# Patient Record
Sex: Male | Born: 1955 | Race: Black or African American | Hispanic: No | Marital: Married | Smoking: Current some day smoker
Health system: Southern US, Community
[De-identification: ages and names within clinical notes are randomized; demographics above are authoritative.]

## PROBLEM LIST (undated history)

## (undated) DIAGNOSIS — K8050 Calculus of bile duct without cholangitis or cholecystitis without obstruction: Secondary | ICD-10-CM

## (undated) DIAGNOSIS — F1421 Cocaine dependence, in remission: Secondary | ICD-10-CM

## (undated) DIAGNOSIS — S060XAA Concussion with loss of consciousness status unknown, initial encounter: Secondary | ICD-10-CM

## (undated) DIAGNOSIS — N189 Chronic kidney disease, unspecified: Secondary | ICD-10-CM

## (undated) DIAGNOSIS — K859 Acute pancreatitis without necrosis or infection, unspecified: Secondary | ICD-10-CM

## (undated) DIAGNOSIS — I10 Essential (primary) hypertension: Secondary | ICD-10-CM

## (undated) DIAGNOSIS — M419 Scoliosis, unspecified: Secondary | ICD-10-CM

## (undated) DIAGNOSIS — G43909 Migraine, unspecified, not intractable, without status migrainosus: Secondary | ICD-10-CM

## (undated) DIAGNOSIS — R7303 Prediabetes: Secondary | ICD-10-CM

## (undated) DIAGNOSIS — G4730 Sleep apnea, unspecified: Secondary | ICD-10-CM

## (undated) DIAGNOSIS — Z859 Personal history of malignant neoplasm, unspecified: Secondary | ICD-10-CM

## (undated) DIAGNOSIS — F32A Depression, unspecified: Secondary | ICD-10-CM

## (undated) DIAGNOSIS — C679 Malignant neoplasm of bladder, unspecified: Secondary | ICD-10-CM

## (undated) DIAGNOSIS — I82409 Acute embolism and thrombosis of unspecified deep veins of unspecified lower extremity: Secondary | ICD-10-CM

## (undated) DIAGNOSIS — S199XXA Unspecified injury of neck, initial encounter: Secondary | ICD-10-CM

## (undated) DIAGNOSIS — F1121 Opioid dependence, in remission: Secondary | ICD-10-CM

## (undated) DIAGNOSIS — E785 Hyperlipidemia, unspecified: Secondary | ICD-10-CM

## (undated) DIAGNOSIS — B1920 Unspecified viral hepatitis C without hepatic coma: Secondary | ICD-10-CM

## (undated) DIAGNOSIS — K219 Gastro-esophageal reflux disease without esophagitis: Secondary | ICD-10-CM

## (undated) HISTORY — DX: Acute embolism and thrombosis of unspecified deep veins of unspecified lower extremity: I82.409

## (undated) HISTORY — PX: HERNIA REPAIR: SHX51

## (undated) HISTORY — DX: Migraine, unspecified, not intractable, without status migrainosus: G43.909

## (undated) HISTORY — DX: Hyperlipidemia, unspecified: E78.5

## (undated) HISTORY — DX: Malignant neoplasm of bladder, unspecified: C67.9

## (undated) HISTORY — DX: Personal history of malignant neoplasm, unspecified: Z85.9

## (undated) HISTORY — PX: FOOT SURGERY: SHX648

## (undated) HISTORY — DX: Unspecified viral hepatitis C without hepatic coma: B19.20

## (undated) HISTORY — DX: Depression, unspecified: F32.A

## (undated) HISTORY — PX: BACK SURGERY: SHX140

## (undated) HISTORY — PX: ELBOW SURGERY: SHX618

## (undated) HISTORY — DX: Sleep apnea, unspecified: G47.30

## (undated) HISTORY — DX: Calculus of bile duct without cholangitis or cholecystitis without obstruction: K80.50

## (undated) HISTORY — DX: Scoliosis, unspecified: M41.9

## (undated) HISTORY — DX: Concussion with loss of consciousness status unknown, initial encounter: S06.0XAA

## (undated) HISTORY — DX: Gastro-esophageal reflux disease without esophagitis: K21.9

## (undated) HISTORY — DX: Cocaine dependence, in remission: F14.21

## (undated) HISTORY — PX: CHOLECYSTECTOMY: SHX55

## (undated) HISTORY — PX: NO PAST SURGERIES: SHX2092

## (undated) HISTORY — DX: Acute pancreatitis without necrosis or infection, unspecified: K85.90

## (undated) HISTORY — DX: Chronic kidney disease, unspecified: N18.9

## (undated) HISTORY — DX: Prediabetes: R73.03

## (undated) HISTORY — DX: Opioid dependence, in remission: F11.21

---
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
## 1998-05-25 DIAGNOSIS — M549 Dorsalgia, unspecified: Secondary | ICD-10-CM | POA: Insufficient documentation

## 1998-05-25 DIAGNOSIS — G8929 Other chronic pain: Secondary | ICD-10-CM | POA: Insufficient documentation

## 2016-04-20 DIAGNOSIS — M2042 Other hammer toe(s) (acquired), left foot: Secondary | ICD-10-CM

## 2016-04-20 DIAGNOSIS — F430 Acute stress reaction: Secondary | ICD-10-CM | POA: Insufficient documentation

## 2016-04-20 DIAGNOSIS — M2010 Hallux valgus (acquired), unspecified foot: Secondary | ICD-10-CM | POA: Insufficient documentation

## 2016-04-20 DIAGNOSIS — M2041 Other hammer toe(s) (acquired), right foot: Secondary | ICD-10-CM | POA: Insufficient documentation

## 2017-10-23 ENCOUNTER — Emergency Department: Payer: Medicaid Other

## 2017-10-23 ENCOUNTER — Encounter: Payer: Self-pay | Admitting: Emergency Medicine

## 2017-10-23 ENCOUNTER — Emergency Department
Admission: EM | Admit: 2017-10-23 | Discharge: 2017-10-23 | Disposition: A | Discharge status: Home / Self Care | Payer: Medicaid Other | Attending: Emergency Medicine | Admitting: Emergency Medicine

## 2017-10-23 ENCOUNTER — Other Ambulatory Visit: Payer: Self-pay

## 2017-10-23 DIAGNOSIS — R51 Headache: Secondary | ICD-10-CM | POA: Insufficient documentation

## 2017-10-23 DIAGNOSIS — Z79899 Other long term (current) drug therapy: Secondary | ICD-10-CM | POA: Insufficient documentation

## 2017-10-23 DIAGNOSIS — Y9389 Activity, other specified: Secondary | ICD-10-CM | POA: Diagnosis not present

## 2017-10-23 DIAGNOSIS — I10 Essential (primary) hypertension: Secondary | ICD-10-CM | POA: Insufficient documentation

## 2017-10-23 DIAGNOSIS — Y92410 Unspecified street and highway as the place of occurrence of the external cause: Secondary | ICD-10-CM | POA: Insufficient documentation

## 2017-10-23 DIAGNOSIS — Y998 Other external cause status: Secondary | ICD-10-CM | POA: Diagnosis not present

## 2017-10-23 DIAGNOSIS — M545 Low back pain: Secondary | ICD-10-CM | POA: Diagnosis not present

## 2017-10-23 DIAGNOSIS — S161XXA Strain of muscle, fascia and tendon at neck level, initial encounter: Secondary | ICD-10-CM | POA: Insufficient documentation

## 2017-10-23 DIAGNOSIS — R519 Headache, unspecified: Secondary | ICD-10-CM

## 2017-10-23 DIAGNOSIS — S199XXA Unspecified injury of neck, initial encounter: Secondary | ICD-10-CM | POA: Diagnosis present

## 2017-10-23 HISTORY — DX: Essential (primary) hypertension: I10
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
## 2017-10-23 MED ORDER — ACETAMINOPHEN 325 MG PO TABS
650.0000 mg | ORAL_TABLET | Freq: Once | ORAL | Status: AC
  Administered 2017-10-23: 650 mg via ORAL
  Filled 2017-10-23: qty 2

## 2017-10-23 MED ORDER — CYCLOBENZAPRINE HCL 5 MG PO TABS: ORAL_TABLET | ORAL | 0 refills | Status: DC

## 2017-10-23 MED ORDER — NAPROXEN 500 MG PO TABS: 500.0000 mg | ORAL_TABLET | Freq: Two times a day (BID) | ORAL | 0 refills | Status: DC
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
## 2018-07-04 ENCOUNTER — Ambulatory Visit
Admission: EM | Admit: 2018-07-04 | Discharge: 2018-07-04 | Discharge status: Against Medical Advice | Payer: No Typology Code available for payment source

## 2018-07-04 ENCOUNTER — Other Ambulatory Visit: Payer: Self-pay

## 2018-07-04 ENCOUNTER — Emergency Department: Payer: Medicaid Other

## 2018-07-04 ENCOUNTER — Emergency Department
Admission: EM | Admit: 2018-07-04 | Discharge: 2018-07-04 | Disposition: A | Discharge status: Home / Self Care | Payer: Medicaid Other | Attending: Emergency Medicine | Admitting: Emergency Medicine

## 2018-07-04 DIAGNOSIS — X500XXA Overexertion from strenuous movement or load, initial encounter: Secondary | ICD-10-CM | POA: Insufficient documentation

## 2018-07-04 DIAGNOSIS — Y990 Civilian activity done for income or pay: Secondary | ICD-10-CM | POA: Insufficient documentation

## 2018-07-04 DIAGNOSIS — M5489 Other dorsalgia: Secondary | ICD-10-CM | POA: Diagnosis present

## 2018-07-04 DIAGNOSIS — S39012A Strain of muscle, fascia and tendon of lower back, initial encounter: Secondary | ICD-10-CM | POA: Insufficient documentation

## 2018-07-04 DIAGNOSIS — S29012A Strain of muscle and tendon of back wall of thorax, initial encounter: Secondary | ICD-10-CM | POA: Diagnosis not present

## 2018-07-04 DIAGNOSIS — Y9389 Activity, other specified: Secondary | ICD-10-CM | POA: Insufficient documentation

## 2018-07-04 DIAGNOSIS — T148XXA Other injury of unspecified body region, initial encounter: Secondary | ICD-10-CM

## 2018-07-04 DIAGNOSIS — Y9289 Other specified places as the place of occurrence of the external cause: Secondary | ICD-10-CM | POA: Insufficient documentation

## 2018-07-04 DIAGNOSIS — I10 Essential (primary) hypertension: Secondary | ICD-10-CM | POA: Insufficient documentation

## 2018-07-04 IMAGING — CR DG LUMBAR SPINE 2-3V
1 series · 3 of 3 positions shown · non-contrast
Comparison: [DATE]

CLINICAL DATA: Low back pain post picking up heavy objects.

EXAM:
LUMBAR SPINE - 2-3 VIEW

[Series 1: dg lumbar spine 2-3 views · 0.14mm/px · 3 of 3 slices shown]
[im 1/3]
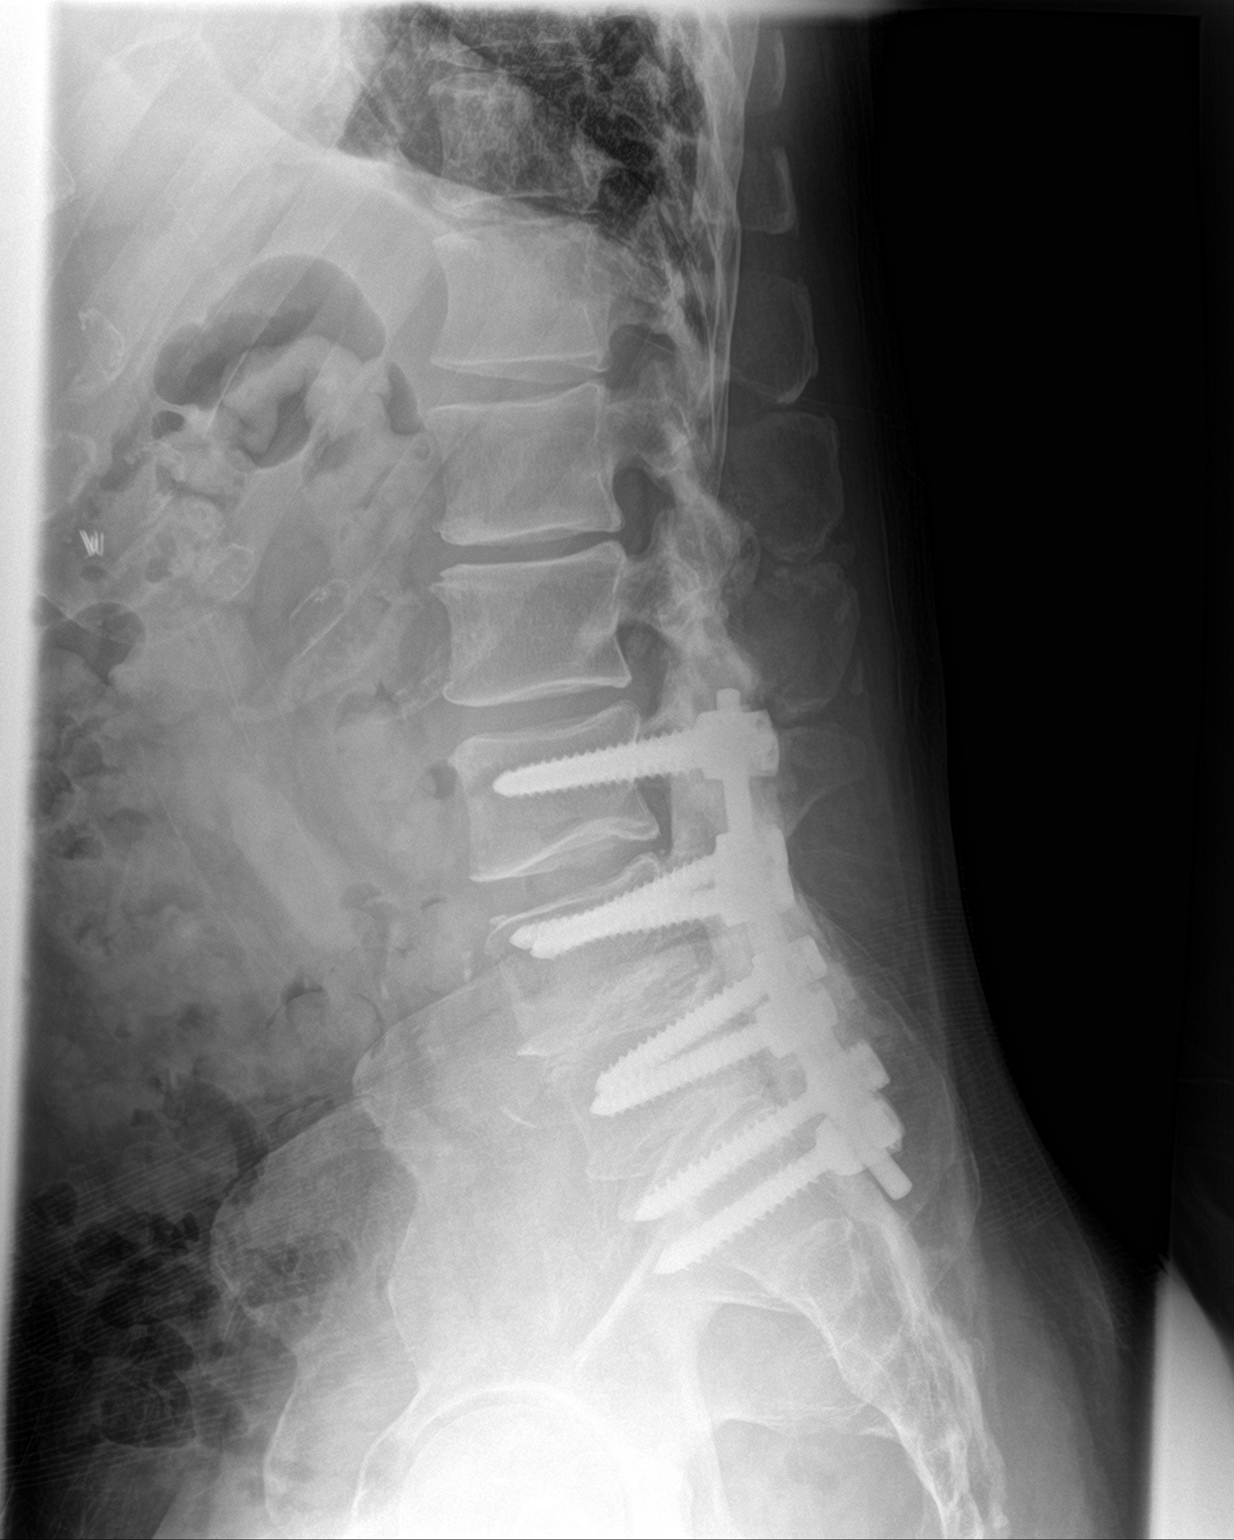
[im 2/3]
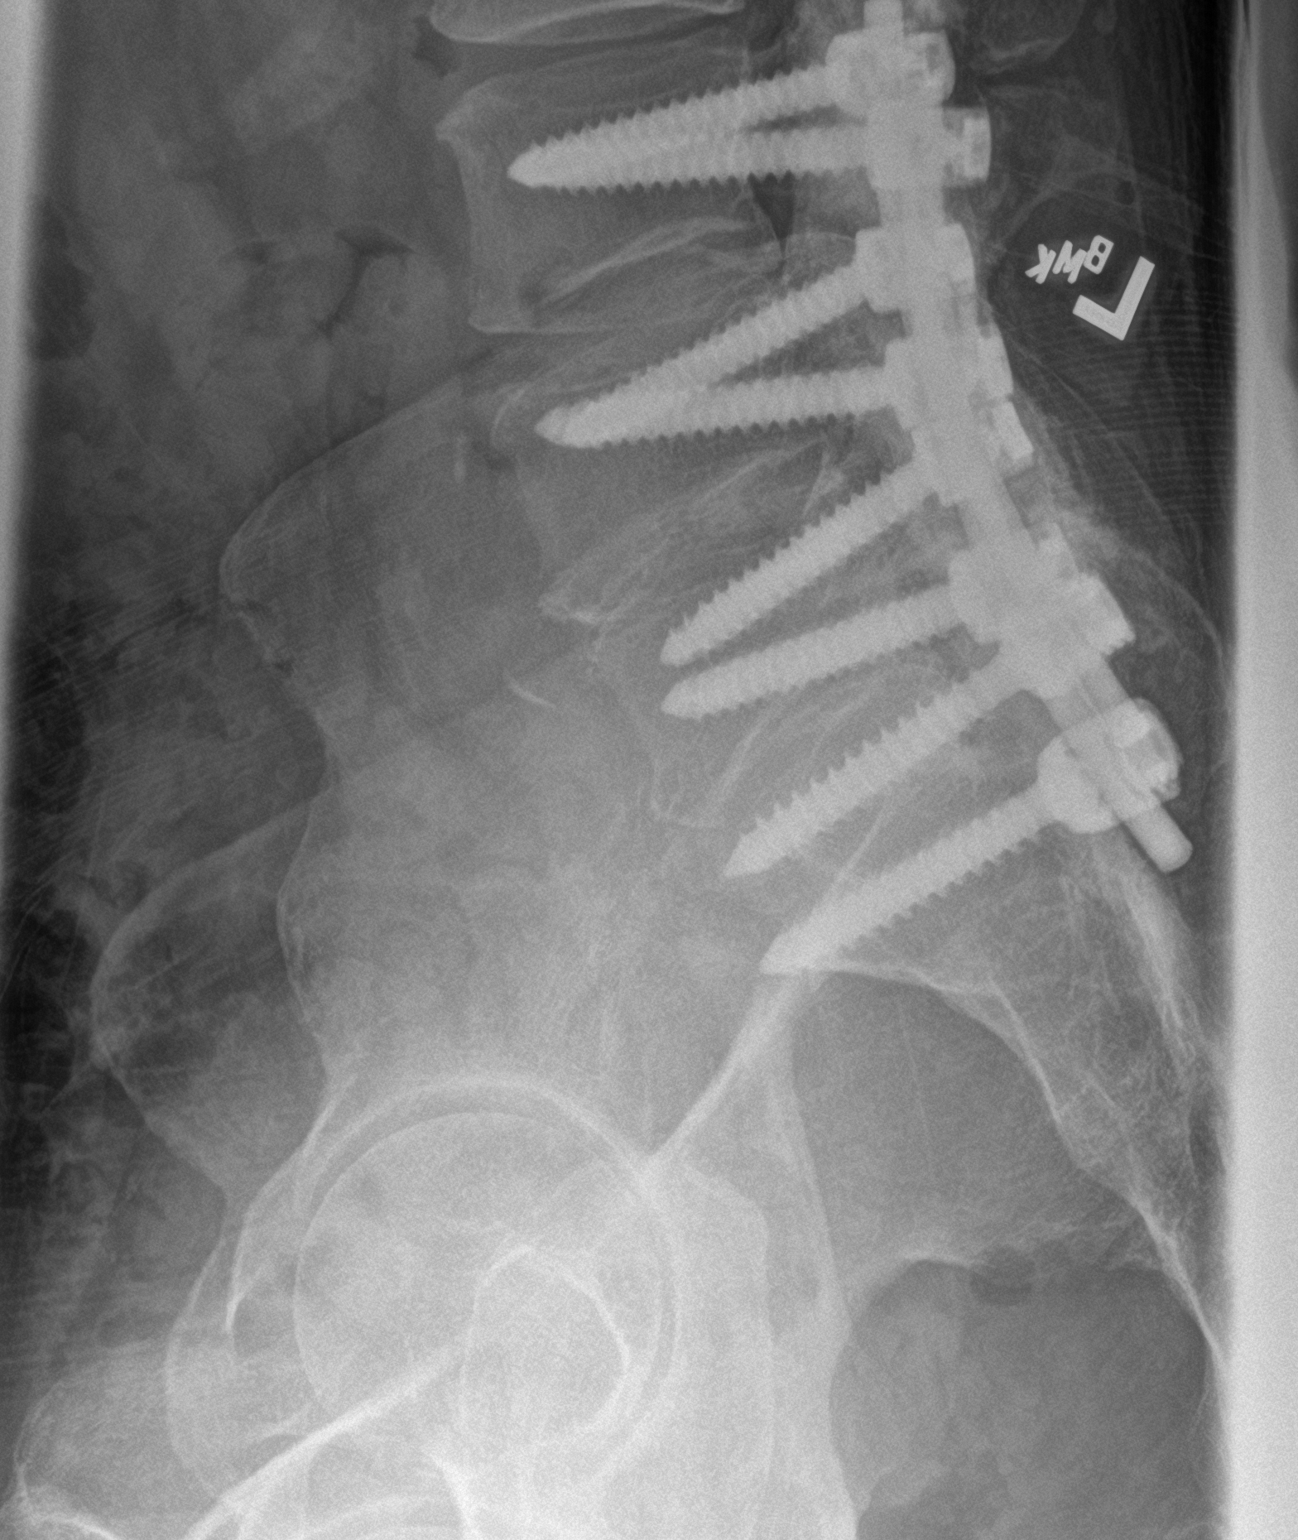
[im 3/3]
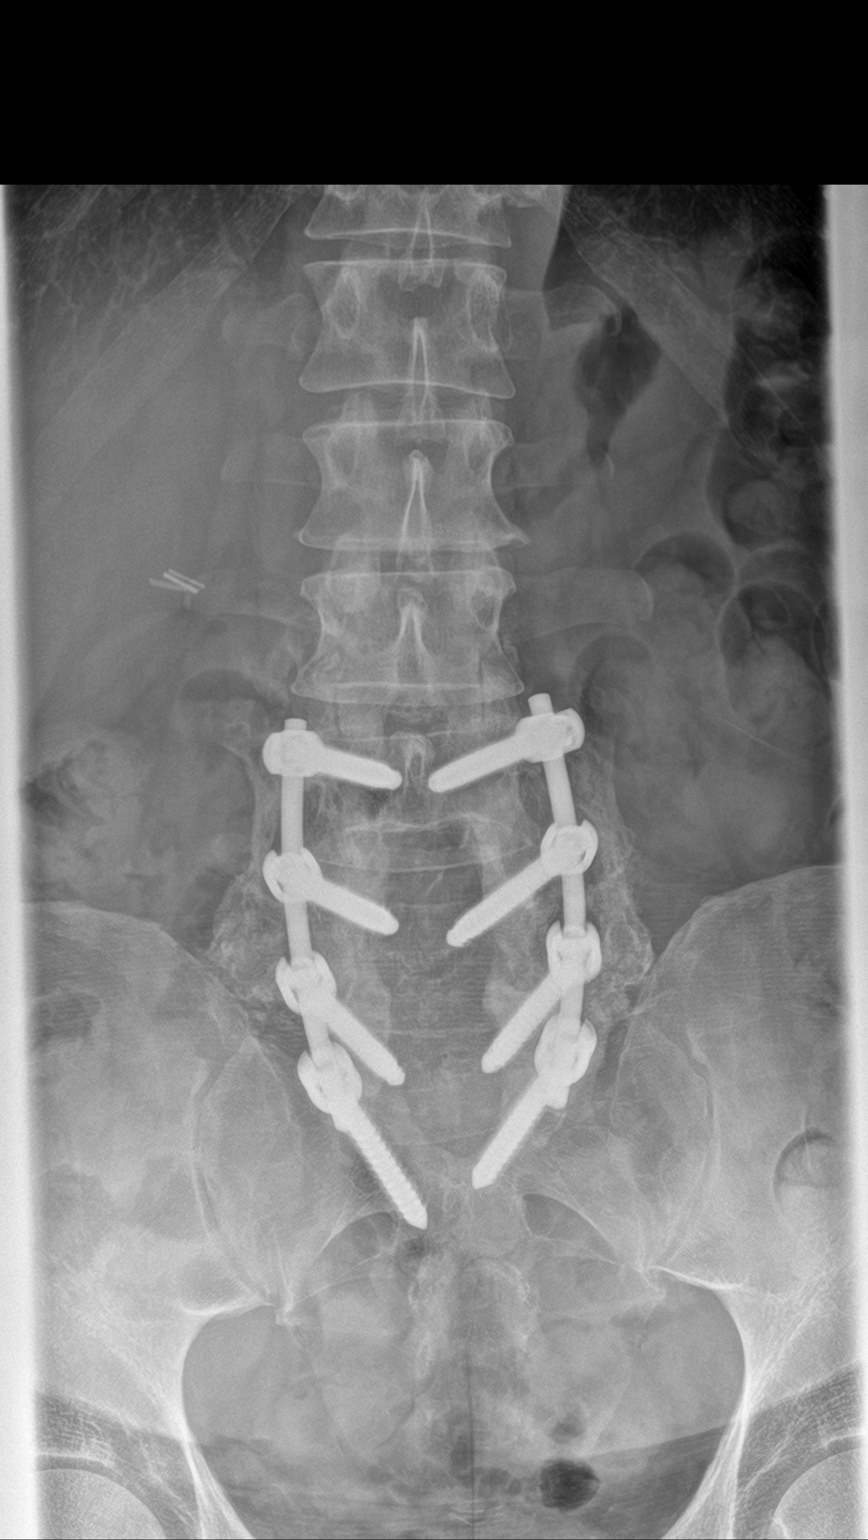

[3 of 3 positions shown; findings below may reference images not displayed]

FINDINGS: Using the vertebral body labeling consistent with the prior exam
dated [DATE], assuming small rudimentary ribs at T12 vertebral
body, there is a stable appearance of L3-S1 posterior fusion with
associated L4-S1 posterior decompression. The hardware is intact and
there is normal alignment of the lumbosacral spine. No evidence of
fracture. Minimal osteoarthritic changes at L1-L2 and L2-L3.

Mild calcific atherosclerotic disease of the abdominal aorta.
IMPRESSION: No acute fracture or alignment abnormality of the lumbosacral spine.

Stable L3-S1 posterior spinal fusion without complicating features.

## 2018-07-04 MED ORDER — CYCLOBENZAPRINE HCL 10 MG PO TABS
5.0000 mg | ORAL_TABLET | Freq: Once | ORAL | Status: AC
  Administered 2018-07-04: 5 mg via ORAL
  Filled 2018-07-04: qty 1

## 2018-07-04 MED ORDER — CYCLOBENZAPRINE HCL 5 MG PO TABS: ORAL_TABLET | ORAL | 0 refills | Status: DC

## 2018-07-04 MED ORDER — LIDOCAINE 5 % EX PTCH
1.0000 | MEDICATED_PATCH | CUTANEOUS | Status: DC
  Administered 2018-07-04: 1 via TRANSDERMAL
  Filled 2018-07-04: qty 1

## 2018-07-04 MED ORDER — LIDOCAINE 5 % EX PTCH: 1.0000 | MEDICATED_PATCH | CUTANEOUS | 0 refills | Status: DC

## 2018-07-04 NOTE — ED Triage Notes (Signed)
Pt was picking heavy object at work and injured right shoulder and lower back.

## 2018-07-04 NOTE — ED Notes (Signed)
Pt employed with [employer], workers comp profile indicates UDS required

## 2018-07-04 NOTE — ED Provider Notes (Signed)
[facility] Emergency Department Provider Note  ____________________________________________  Time seen: Approximately 8:17 PM  I have reviewed the triage vital signs and the nursing notes.   HISTORY  Chief Complaint Back Pain    HPI Earl Murray is a 63 y.o. male that presents to the emergency department for evaluation of right upper back pain and low back pain after injury today.  Patient states that he was lifting up a large roll of wax paper that weighed at least 100 pounds when he felt a pull in his back.  He has had pain since.  He has taken Aleve for pain, without relief.  Past Medical History:  Diagnosis Date  . Hypertension     There are no active problems to display for this patient.   No past surgical history on file.  Prior to Admission medications   Medication Sig Start Date End Date Taking? Authorizing Provider  cyclobenzaprine (FLEXERIL) 5 MG tablet Take 1-2 tablets 3 times daily as needed 07/04/18   Laban Emperor, PA-C  lidocaine (LIDODERM) 5 % Place 1 patch onto the skin daily. Remove & Discard patch within 12 hours or as directed by MD 07/04/18   Laban Emperor, PA-C  naproxen (NAPROSYN) 500 MG tablet Take 1 tablet (500 mg total) by mouth 2 (two) times daily with a meal. 10/23/17 10/23/18  Laban Emperor, PA-C    Allergies Motrin [ibuprofen] and Tramadol  No family history on file.  Social History Social History   Tobacco Use  . Smoking status: Not on file  Substance Use Topics  . Alcohol use: Not on file  . Drug use: Not on file     Review of Systems  Constitutional: No fever/chills Cardiovascular: No chest pain. Respiratory: No SOB. Gastrointestinal: No abdominal pain.  No nausea, no vomiting.  Musculoskeletal: Positive for back pain.  Skin: Negative for rash, abrasions, lacerations, ecchymosis. Neurological: Negative for headaches   ____________________________________________   PHYSICAL EXAM:  VITAL SIGNS: ED  Triage Vitals  Enc Vitals Group     BP 07/04/18 1917 127/72     Pulse Rate 07/04/18 1917 61     Resp --      Temp 07/04/18 1917 98.1 F (36.7 C)     Temp Source 07/04/18 1917 Oral     SpO2 07/04/18 1917 95 %     Weight 07/04/18 1918 205 lb (93 kg)     Height 07/04/18 1918 6\' 5"  (1.956 m)     Head Circumference --      Peak Flow --      Pain Score 07/04/18 1922 8     Pain Loc --      Pain Edu? --      Excl. in [place]? --      Constitutional: Alert and oriented. Well appearing and in no acute distress. Eyes: Conjunctivae are normal. PERRL. EOMI. Head: Atraumatic. ENT:      Ears:      Nose: No congestion/rhinnorhea.      Mouth/Throat: Mucous membranes are moist.  Neck: No stridor. Cardiovascular: Normal rate, regular rhythm.  Good peripheral circulation. Respiratory: Normal respiratory effort without tachypnea or retractions. Lungs CTAB. Good air entry to the bases with no decreased or absent breath sounds. Gastrointestinal: Bowel sounds 4 quadrants. Soft and nontender to palpation. No guarding or rigidity. No palpable masses. No distention.  Musculoskeletal: Full range of motion to all extremities. No gross deformities appreciated.  Tenderness to palpation to right scapula.  Mild tenderness to palpation diffusely  through lumbar spine and lumbar paraspinal muscles.  Normal gait. Neurologic:  Normal speech and language. No gross focal neurologic deficits are appreciated.  Skin:  Skin is warm, dry and intact. No rash noted. Psychiatric: Mood and affect are normal. Speech and behavior are normal. Patient exhibits appropriate insight and judgement.   ____________________________________________   LABS (all labs ordered are listed, but only abnormal results are displayed)  Labs Reviewed - No data to display ____________________________________________  EKG   ____________________________________________  RADIOLOGY [name], personally viewed and evaluated these images  (plain radiographs) as part of my medical decision making, as well as reviewing the written report by the radiologist.  Dg Lumbar Spine 2-3 Views  Result Date: 07/04/2018 CLINICAL DATA:  Low back pain post picking up heavy objects. EXAM: LUMBAR SPINE - 2-3 VIEW COMPARISON:  10/23/2017 FINDINGS: Using the vertebral body labeling consistent with the prior exam dated 10/23/2017, assuming small rudimentary ribs at T12 vertebral body, there is a stable appearance of L3-S1 posterior fusion with associated L4-S1 posterior decompression. The hardware is intact and there is normal alignment of the lumbosacral spine. No evidence of fracture. Minimal osteoarthritic changes at L1-L2 and L2-L3. Mild calcific atherosclerotic disease of the abdominal aorta. IMPRESSION: No acute fracture or alignment abnormality of the lumbosacral spine. Stable L3-S1 posterior spinal fusion without complicating features. Electronically Signed   By: Fidela Salisbury M.D.   On: 07/04/2018 20:31   Dg Scapula Right  Result Date: 07/04/2018 CLINICAL DATA:  Right shoulder lifting injury today. Initial encounter. EXAM: RIGHT SCAPULA - 2+ VIEWS COMPARISON:  None. FINDINGS: There is no evidence of fracture or other focal bone lesions. Soft tissues are unremarkable. IMPRESSION: Negative exam. Electronically Signed   By: Inge Rise M.D.   On: 07/04/2018 20:26    ____________________________________________    PROCEDURES  Procedure(s) performed:    Procedures    Medications  lidocaine (LIDODERM) 5 % 1 patch (1 patch Transdermal Patch Applied 07/04/18 2048)  cyclobenzaprine (FLEXERIL) tablet 5 mg (5 mg Oral Given 07/04/18 2047)     ____________________________________________   INITIAL IMPRESSION / ASSESSMENT AND PLAN / ED COURSE  Pertinent labs & imaging results that were available during my care of the patient were reviewed by me and considered in my medical decision making (see chart for details).  Review of the [place]  CSRS was performed in accordance of the [place] prior to dispensing any controlled drugs.     Patient presented to emergency department for evaluation after lifting injury at work today.  Vital signs and exam are reassuring.  Scapular and lumbar x-rays are negative for acute bony abnormalities.  Patient felt better after Flexeril and Lidoderm.  He appears well.  Patient will be discharged home with prescriptions for Flexeril and Lidoderm. Patient is to follow up with primary care as directed. Patient is given ED precautions to return to the ED for any worsening or new symptoms.     ____________________________________________  FINAL CLINICAL IMPRESSION(S) / ED DIAGNOSES  Final diagnoses:  Muscle strain      NEW MEDICATIONS STARTED DURING THIS VISIT:  ED Discharge Orders         Ordered    cyclobenzaprine (FLEXERIL) 5 MG tablet     07/04/18 2115    lidocaine (LIDODERM) 5 %  Every 24 hours     07/04/18 2115              This chart was dictated using voice recognition software/Dragon. Despite best efforts to proofread,  errors can occur which can change the meaning. Any change was purely unintentional.    Laban Emperor, PA-C 07/04/18 [place], [place], MD 07/04/18 2326

## 2018-07-26 ENCOUNTER — Ambulatory Visit
Admission: EM | Admit: 2018-07-26 | Discharge: 2018-07-26 | Discharge status: Absent without leave | Payer: Medicaid Other

## 2018-08-15 ENCOUNTER — Other Ambulatory Visit: Payer: Self-pay

## 2018-08-15 ENCOUNTER — Ambulatory Visit
Admission: EM | Admit: 2018-08-15 | Discharge: 2018-08-15 | Disposition: A | Discharge status: Home / Self Care | Payer: Medicaid Other | Attending: Family Medicine | Admitting: Family Medicine

## 2018-08-15 DIAGNOSIS — R600 Localized edema: Secondary | ICD-10-CM | POA: Diagnosis not present

## 2018-08-15 MED ORDER — HYDROCHLOROTHIAZIDE 25 MG PO TABS: 25.0000 mg | ORAL_TABLET | Freq: Every day | ORAL | 1 refills | Status: DC

## 2018-08-15 NOTE — ED Provider Notes (Signed)
[facility]    CSN: [id number] Arrival date & time: 08/15/18  1214  History   Chief Complaint Chief Complaint  Patient presents with  . Foot Pain   HPI  63 year old male presents with bilateral swelling of the feet and ankles.  Patient states that he noted swelling of his feet and ankles on Friday.  He states that it was initially severe.  It is improved currently.  Bilateral but left greater than right.  Denies shortness of breath.  No recent fall, trauma, injury.  He does note that he eats a lot of salty food as well as fried food.  He is on his feet quite a lot at work.  No known relieving factors.  Patient Earl Murray is compliant with his lisinopril.  No other associated symptoms.  No other complaints.   PMH, Surgical Hx, Family Hx, Social History reviewed and updated as below.  Past Medical History:  Diagnosis Date  . Hypertension    Past Surgical History:  Procedure Laterality Date  . NO PAST SURGERIES     Home Medications    Prior to Admission medications   Medication Sig Start Date End Date Taking? Authorizing Provider  hydrochlorothiazide (HYDRODIURIL) 25 MG tablet Take 1 tablet (25 mg total) by mouth daily. 08/15/18   Coral Spikes, DO    Family History Family History  Problem Relation Age of Onset  . Hypertension Mother   . Heart disease Mother   . Colon cancer Father     Social History Social History   Tobacco Use  . Smoking status: Current Every Day Smoker    Types: Cigarettes  . Smokeless tobacco: Never Used  Substance Use Topics  . Alcohol use: Not Currently  . Drug use: Not Currently     Allergies   Motrin [ibuprofen] and Tramadol   Review of Systems Review of Systems  Respiratory: Negative.   Musculoskeletal:       Foot and ankle edema.   Physical Exam Triage Vital Signs ED Triage Vitals  Enc Vitals Group     BP 08/15/18 1336 119/69     Pulse Rate 08/15/18 1336 62     Resp 08/15/18 1336 16     Temp 08/15/18 1336 98 F  (36.7 C)     Temp Source 08/15/18 1336 Oral     SpO2 08/15/18 1336 98 %     Weight 08/15/18 1333 208 lb (94.3 kg)     Height 08/15/18 1333 6\' 5"  (1.956 m)     Head Circumference --      Peak Flow --      Pain Score 08/15/18 1333 10     Pain Loc --      Pain Edu? --      Excl. in [place]? --    Updated Vital Signs BP 119/69 (BP Location: Left Arm)   Pulse 62   Temp 98 F (36.7 C) (Oral)   Resp 16   Ht 6\' 5"  (1.956 m)   Wt 94.3 kg   SpO2 98%   BMI 24.67 kg/m   Visual Acuity Right Eye Distance:   Left Eye Distance:   Bilateral Distance:    Right Eye Near:   Left Eye Near:    Bilateral Near:     Physical Exam Vitals signs and nursing note reviewed.  Constitutional:      General: He is not in acute distress.    Appearance: Normal appearance.  HENT:     Head: Normocephalic and atraumatic.  Eyes:     General:        Right eye: No discharge.        Left eye: No discharge.     Conjunctiva/sclera: Conjunctivae normal.  Cardiovascular:     Rate and Rhythm: Normal rate and regular rhythm.  Pulmonary:     Effort: Pulmonary effort is normal.     Breath sounds: Normal breath sounds.  Musculoskeletal:     Comments: Trace bilateral lower extremity edema.  Neurological:     Mental Status: He is alert.  Psychiatric:        Mood and Affect: Mood normal.        Behavior: Behavior normal.    UC Treatments / Results  Labs (all labs ordered are listed, but only abnormal results are displayed) Labs Reviewed - No data to display  EKG None  Radiology No results found.  Procedures Procedures (including critical care time)  Medications Ordered in UC Medications - No data to display  Initial Impression / Assessment and Plan / UC Course  I have reviewed the triage vital signs and the nursing notes.  Pertinent labs & imaging results that were available during my care of the patient were reviewed by me and considered in my medical decision making (see chart for details).     63 year old male presents with bilateral lower extremity edema.  No evidence of CHF.  Does not appear to have venous stasis.  Possibly related to dietary indiscretion.  Stopping lisinopril.  Starting HCTZ.    Final Clinical Impressions(s) / UC Diagnoses   Final diagnoses:  Lower extremity edema     Discharge Instructions     Elevate the legs.  Stop the lisinopril.  Start HCTZ.  Take care  Dr. Lacinda Axon    ED Prescriptions    Medication Sig Dispense Auth. Provider   hydrochlorothiazide (HYDRODIURIL) 25 MG tablet Take 1 tablet (25 mg total) by mouth daily. 90 tablet Coral Spikes, DO     Controlled Substance Prescriptions [place] Controlled Substance Registry consulted? Not Applicable   Coral Spikes, DO 08/15/18 1429

## 2018-08-15 NOTE — Discharge Instructions (Signed)
Elevate the legs.  Stop the lisinopril.  Start HCTZ.  Take care  Dr. Lacinda Axon

## 2018-08-15 NOTE — ED Triage Notes (Signed)
Patient complains of bilateral foot pain with swelling, states that he noticed this on Friday.

## 2018-08-25 ENCOUNTER — Other Ambulatory Visit: Payer: Self-pay | Admitting: *Deleted

## 2018-08-25 IMAGING — CR DG SCAPULA*R*
1 series · 2 of 2 positions shown · non-contrast
Comparison: None.

CLINICAL DATA: Right shoulder lifting injury today. Initial
encounter.

EXAM:
RIGHT SCAPULA - 2+ VIEWS

[Series 1: dg scapula right · 0.14mm/px · 2 of 2 slices shown]
[im 1/2]
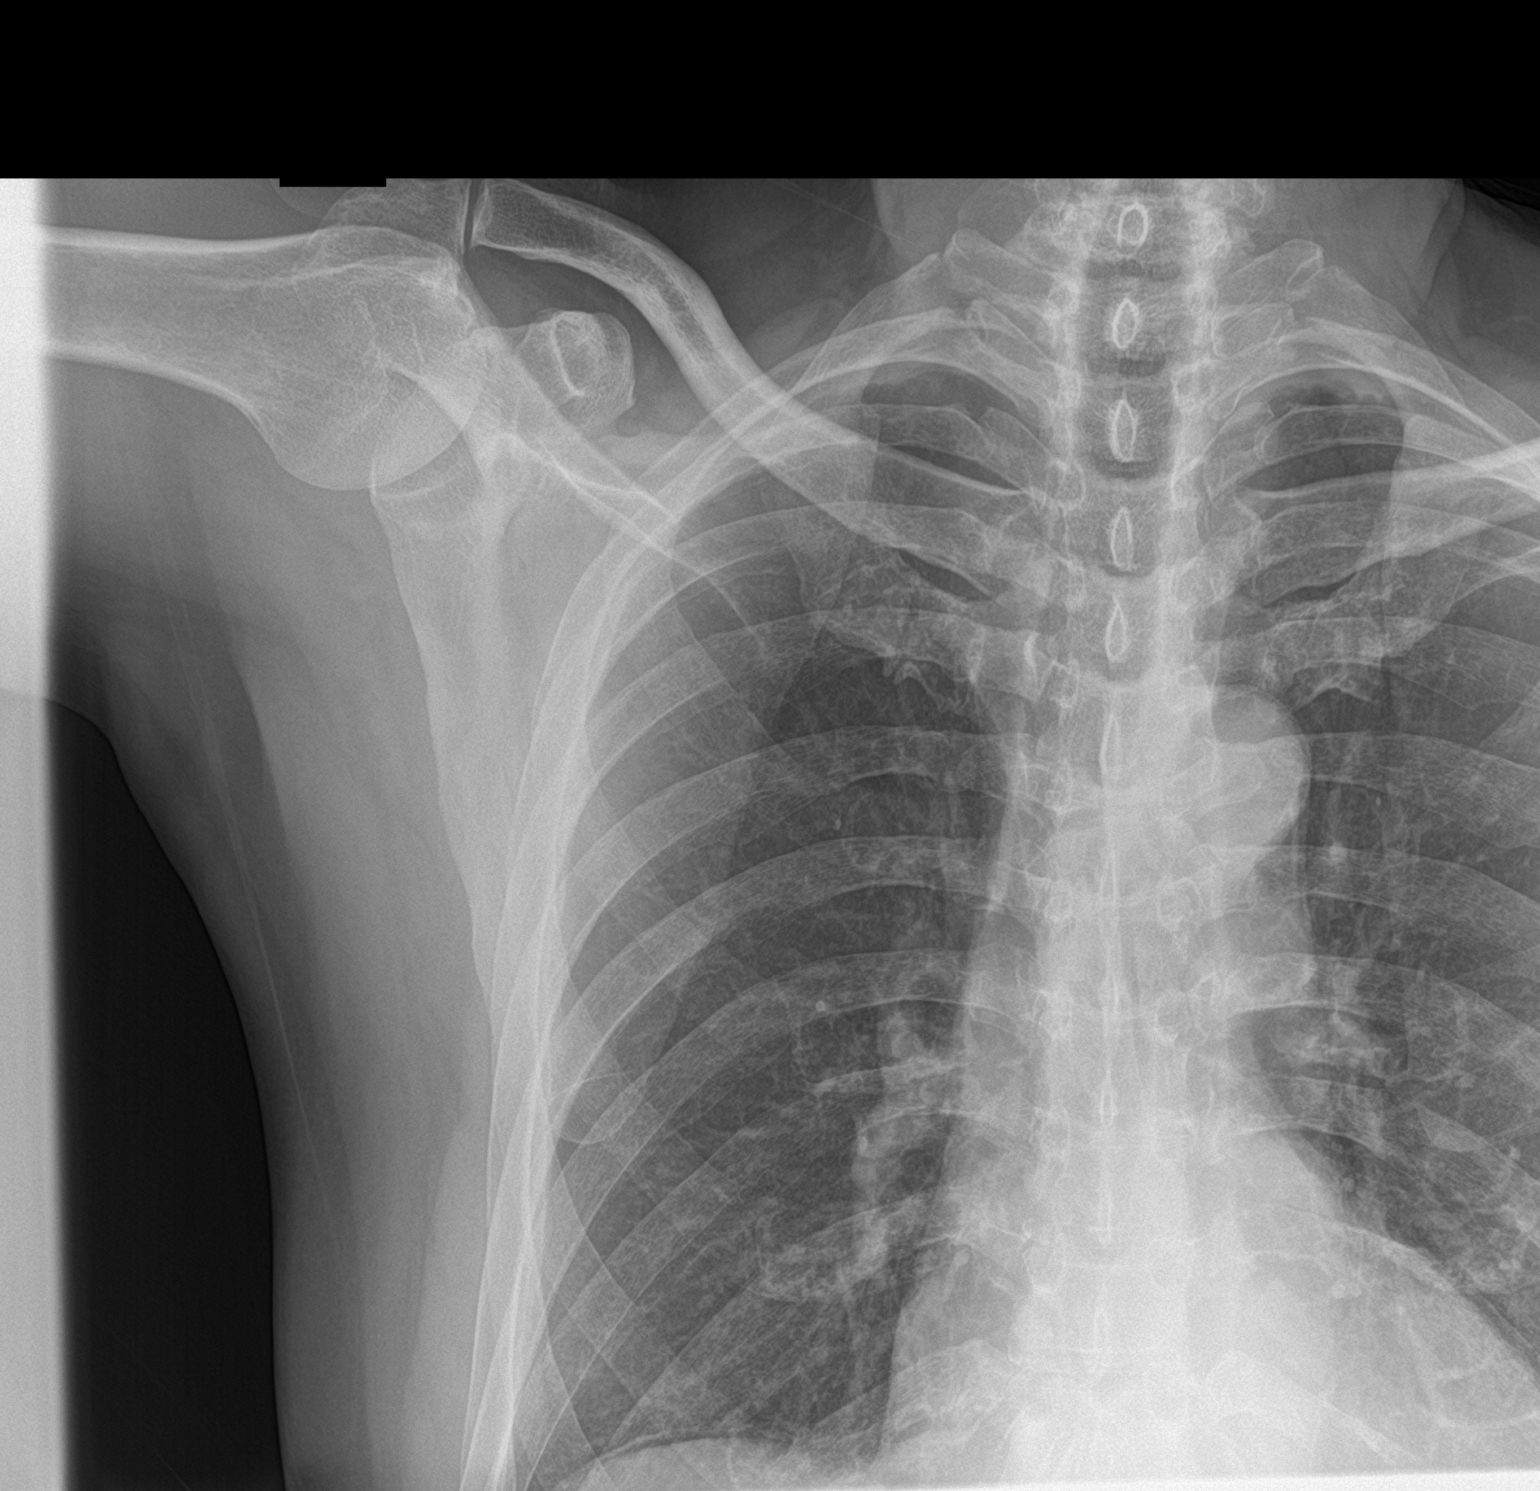
[im 2/2]
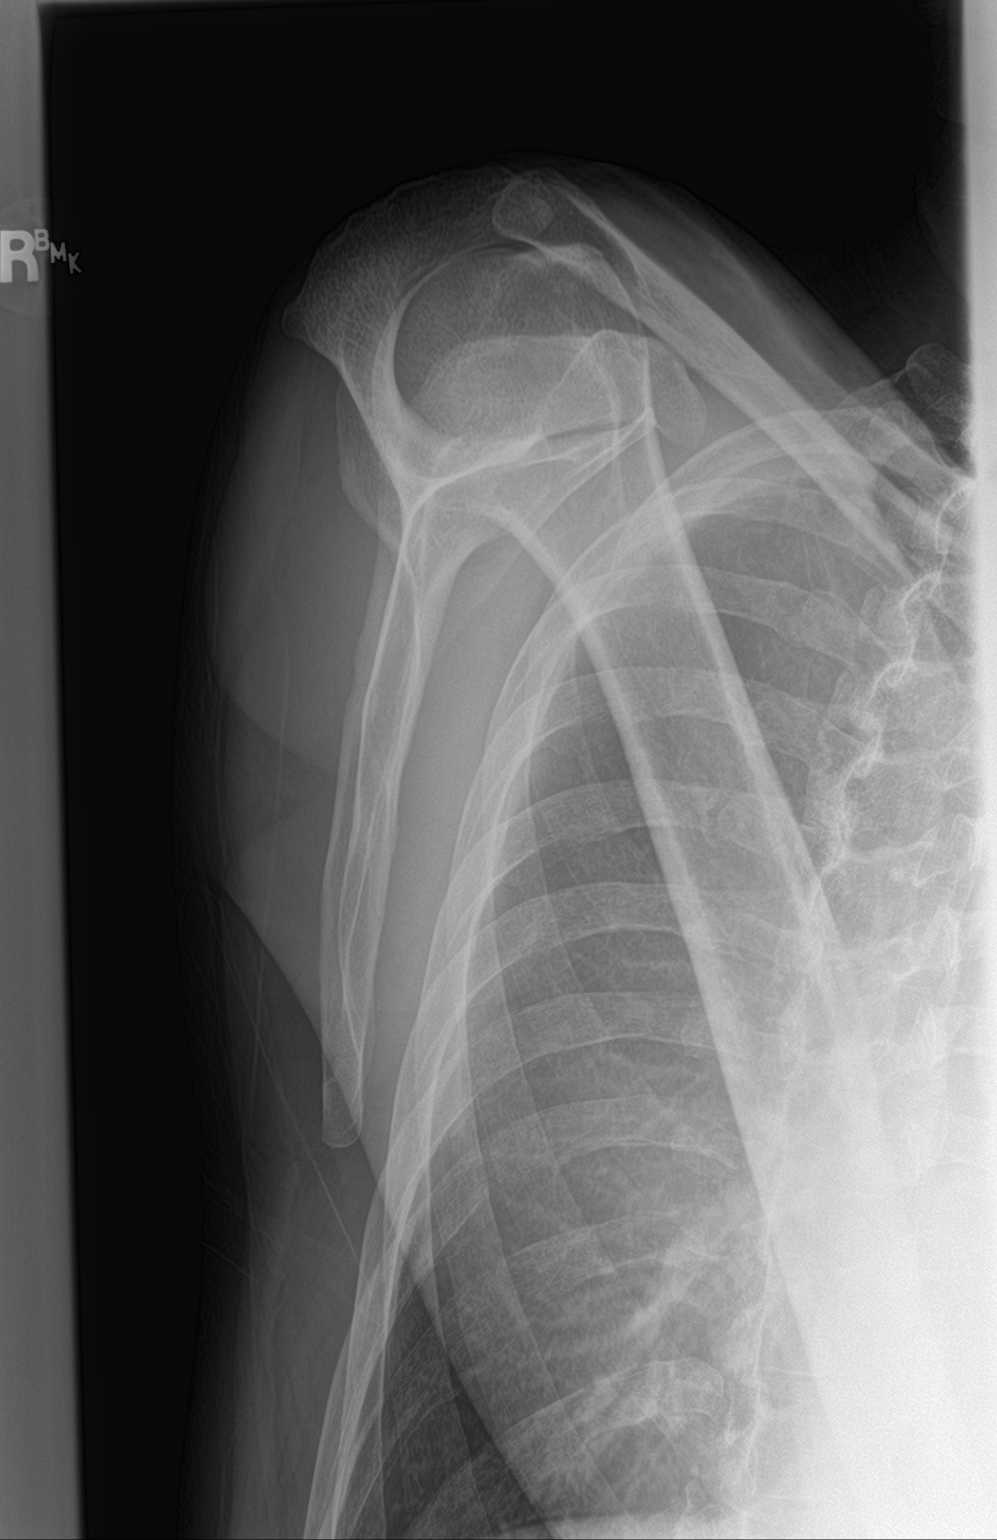

[2 of 2 positions shown; findings below may reference images not displayed]

FINDINGS: There is no evidence of fracture or other focal bone lesions. Soft
tissues are unremarkable.
IMPRESSION: Negative exam.

## 2018-09-12 DIAGNOSIS — M5412 Radiculopathy, cervical region: Secondary | ICD-10-CM | POA: Insufficient documentation

## 2018-09-12 DIAGNOSIS — M5126 Other intervertebral disc displacement, lumbar region: Secondary | ICD-10-CM | POA: Insufficient documentation

## 2018-09-15 DIAGNOSIS — M5416 Radiculopathy, lumbar region: Secondary | ICD-10-CM | POA: Insufficient documentation

## 2019-02-18 ENCOUNTER — Encounter: Payer: Self-pay | Admitting: Gynecology

## 2019-02-18 ENCOUNTER — Other Ambulatory Visit: Payer: Self-pay

## 2019-02-18 ENCOUNTER — Ambulatory Visit
Admission: EM | Admit: 2019-02-18 | Discharge: 2019-02-18 | Disposition: A | Discharge status: Home / Self Care | Payer: No Typology Code available for payment source | Attending: Family Medicine | Admitting: Family Medicine

## 2019-02-18 DIAGNOSIS — M76891 Other specified enthesopathies of right lower limb, excluding foot: Secondary | ICD-10-CM

## 2019-02-18 DIAGNOSIS — M542 Cervicalgia: Secondary | ICD-10-CM | POA: Diagnosis not present

## 2019-02-18 HISTORY — DX: Unspecified injury of neck, initial encounter: S19.9XXA

## 2019-02-18 MED ORDER — HYDROCODONE-ACETAMINOPHEN 5-325 MG PO TABS: ORAL_TABLET | ORAL | 0 refills | Status: DC

## 2019-02-18 NOTE — Discharge Instructions (Addendum)
Rest,ice, elevate, easy gentle stretching Follow up with pain specialist as scheduled next week

## 2019-02-18 NOTE — ED Triage Notes (Signed)
Per patient with neck pain and right knee pain. Pt. Stated had a neck injury in February 2020 and was told then that he had a bone spur. Pt. State right knee pain started about a week ago. Per patient rx given Acetaminophen/cod that he is taken causing him to itch. Patient stated Vicodin help better.

## 2019-02-19 NOTE — ED Provider Notes (Signed)
[facility]    CSN: [id number] Arrival date & time: 02/18/19  0941      History   Chief Complaint Chief Complaint  Patient presents with  . Neck Pain  . Knee Pain    HPI Earl Murray is a 63 y.o. male.   63 yo male with a c/o right knee pain for the past week worse when going up and down stairs. Denies any falls or other traumatic injury. Denies any fevers, chills, rash. Patient also states he has a h/o chronic back pain and neck pain and has an appointment with a pain specialist next week.    Neck Pain Knee Pain Associated symptoms: neck pain     Past Medical History:  Diagnosis Date  . Hypertension   . Neck injury     Patient Active Problem List   Diagnosis Date Noted  . Hallux valgus, acquired 04/20/2016  . 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 Stress reaction 04/20/2016    Past Surgical History:  Procedure Laterality Date  . BACK SURGERY    . ELBOW SURGERY    . FOOT SURGERY    . HERNIA REPAIR    . NO PAST SURGERIES         Home Medications    Prior to Admission medications   Medication Sig Start Date End Date Taking? Authorizing Provider  acetaminophen-codeine (TYLENOL #3) 300-30 MG tablet TK 1 T PO Q 6 H PRF PAIN 02/15/19  Yes [provider]  amLODipine (NORVASC) 10 MG tablet amlodipine 10 mg tablet  Take 1 tablet every day by oral route.   Yes [provider]  baclofen (LIORESAL) 10 MG tablet baclofen 10 mg tablet  ONE TAB BID PRN   Yes [provider]  cyclobenzaprine (FLEXERIL) 10 MG tablet TK 1 T PO TID PRN 01/16/19  Yes [provider]  diclofenac (VOLTAREN) 75 MG EC tablet TK 1 T PO BID 08/05/18  Yes [provider]  gabapentin (NEURONTIN) 300 MG capsule gabapentin 300 mg capsule  one tab bid-tid   Yes [provider]  hydrochlorothiazide (HYDRODIURIL) 25 MG tablet Take 1 tablet (25 mg total) by mouth daily. 08/15/18  Yes Cook, Jayce G, DO  meloxicam (MOBIC) 15 MG tablet  meloxicam 15 mg tablet  TK 1 T PO QD   Yes [provider]  HYDROcodone-acetaminophen (NORCO/VICODIN) 5-325 MG tablet 1-2 tabs po bid prn 02/18/19   Norval Gable, MD  oxyCODONE-acetaminophen (PERCOCET/ROXICET) 5-325 MG tablet TK 1 T PO Q 6 TO 8 H PRN 08/05/18   [provider]    Family History Family History  Problem Relation Age of Onset  . Hypertension Mother   . Heart disease Mother   . Colon cancer Father     Social History Social History   Tobacco Use  . Smoking status: Former Smoker    Types: Cigarettes  . Smokeless tobacco: Never Used  Substance Use Topics  . Alcohol use: Not Currently  . Drug use: Not Currently     Allergies   Motrin [ibuprofen] and Tramadol   Review of Systems Review of Systems  Musculoskeletal: Positive for neck pain.     Physical Exam Triage Vital Signs ED Triage Vitals  Enc Vitals Group     BP 02/18/19 0958 (!) 156/95     Pulse Rate 02/18/19 0958 94     Resp 02/18/19 0958 18     Temp 02/18/19 0958 98.4 F (36.9 C)     Temp  Source 02/18/19 0958 Oral     SpO2 02/18/19 0958 99 %     Weight 02/18/19 0952 213 lb (96.6 kg)     Height 02/18/19 0952 6\' 5"  (1.956 m)     Head Circumference --      Peak Flow --      Pain Score 02/18/19 0951 9     Pain Loc --      Pain Edu? --      Excl. in [place]? --    No data found.  Updated Vital Signs BP (!) 156/95 (BP Location: Left Arm)   Pulse 94   Temp 98.4 F (36.9 C) (Oral)   Resp 18   Ht 6\' 5"  (1.956 m)   Wt 96.6 kg   SpO2 99%   BMI 25.26 kg/m   Visual Acuity Right Eye Distance:   Left Eye Distance:   Bilateral Distance:    Right Eye Near:   Left Eye Near:    Bilateral Near:     Physical Exam Vitals signs and nursing note reviewed.  Constitutional:      General: He is not in acute distress.    Appearance: He is not toxic-appearing or diaphoretic.  Musculoskeletal:     Right knee: He exhibits swelling (mild). He exhibits no effusion, no ecchymosis, no  deformity, no laceration, no erythema, normal alignment, no LCL laxity, normal patellar mobility and no MCL laxity. Tenderness (anterior suprapatellar) found. Patellar tendon tenderness noted. No medial joint line, no lateral joint line, no MCL and no LCL tenderness noted.  Neurological:     Mental Status: He is alert.      UC Treatments / Results  Labs (all labs ordered are listed, but only abnormal results are displayed) Labs Reviewed - No data to display  EKG   Radiology No results found.  Procedures Procedures (including critical care time)  Medications Ordered in UC Medications - No data to display  Initial Impression / Assessment and Plan / UC Course  I have reviewed the triage vital signs and the nursing notes.  Pertinent labs & imaging results that were available during my care of the patient were reviewed by me and considered in my medical decision making (see chart for details).     Final Clinical Impressions(s) / UC Diagnoses   Final diagnoses:  Tendonitis of knee, right  Neck pain     Discharge Instructions     Rest,ice, elevate, easy gentle stretching Follow up with pain specialist as scheduled next week    ED Prescriptions    Medication Sig Dispense Auth. Provider   HYDROcodone-acetaminophen (NORCO/VICODIN) 5-325 MG tablet 1-2 tabs po bid prn 10 tablet Norval Gable, MD      1. Labs/x-ray results and diagnosis reviewed with patient 2. rx as per orders above; reviewed possible side effects, interactions, risks and benefits  3. Recommend supportive treatment as above 4. Follow-up prn if symptoms worsen or don't improve  I have reviewed the PDMP during this encounter.   Norval Gable, MD 02/19/19 1556

## 2019-03-01 ENCOUNTER — Other Ambulatory Visit: Payer: Self-pay

## 2019-03-01 ENCOUNTER — Ambulatory Visit
Admission: EM | Admit: 2019-03-01 | Discharge: 2019-03-01 | Disposition: A | Discharge status: Home / Self Care | Payer: Medicaid Other | Attending: Family Medicine | Admitting: Family Medicine

## 2019-03-01 DIAGNOSIS — M25561 Pain in right knee: Secondary | ICD-10-CM

## 2019-03-01 DIAGNOSIS — G8929 Other chronic pain: Secondary | ICD-10-CM

## 2019-03-01 DIAGNOSIS — M2550 Pain in unspecified joint: Secondary | ICD-10-CM | POA: Diagnosis not present

## 2019-03-01 DIAGNOSIS — M25522 Pain in left elbow: Secondary | ICD-10-CM

## 2019-03-01 MED ORDER — DICLOFENAC SODIUM 1 % TD GEL: 2.0000 g | Freq: Four times a day (QID) | TRANSDERMAL | 0 refills | Status: DC

## 2019-03-01 MED ORDER — PREDNISONE 20 MG PO TABS: 40.0000 mg | ORAL_TABLET | Freq: Every day | ORAL | 0 refills | Status: AC

## 2019-03-01 MED ORDER — METHYLPREDNISOLONE SODIUM SUCC 40 MG IJ SOLR
80.0000 mg | Freq: Once | INTRAMUSCULAR | Status: AC
  Administered 2019-03-01: 80 mg via INTRAMUSCULAR

## 2019-03-01 NOTE — ED Triage Notes (Addendum)
Pt was seen here for same on 9/26 and has appointment on Oct. 21 for right knee and left elbow pain. Pt wants pain medication to get him through until the appointment. Pt is seen by pain management

## 2019-03-01 NOTE — Discharge Instructions (Signed)
Continue Mobic daily. Take other medications as prescribed. Alternate between ice and heat to affected areas. Follow-up with orthopedics on 10/21 as scheduled or sooner as needed.

## 2019-03-01 NOTE — ED Provider Notes (Signed)
[facility]    CSN: [id number] Arrival date & time: 03/01/19  1353      History   Chief Complaint Chief Complaint  Patient presents with   Knee Pain    HPI Osaze Kleinfeldt is a 63 y.o. male.   History of Present Illness  Tavonte Crudele is a 63 y.o. male that presents with complaints of pain in the anterior aspect of the right knee without radiation. Patient has history of tendinitis of that knee. Patient describes pain as aching and throbbing. Pain severity now is 8 /10. Pain is aggravated by movement, weight bearing and palpation. Pain is alleviated by nothing. Patient denies any numbness, tingling, weakness, loss of sensation, loss of motion or inability to bear weight.   Mr. Lardie also complains of pain in the left elbow pain. He has had prior MRI which showed some nerve damage. Patient describes pain as throbbing and tingling. Pain severity now is 9 /10. The pain now radiates down the arm and into the fingers. Pain is aggravated by movement, use and palpation. Pain is alleviated by nothing. Patient denies any numbness, weakness, loss of sensation, loss of motion to the left upper extremity.   Patient has an appointment with ortho on the 21st of this month to address both these issues.        Past Medical History:  Diagnosis Date   Hypertension    Neck injury     Patient Active Problem List   Diagnosis Date Noted   Hallux valgus, acquired 04/20/2016   Hammer toes of both feet 04/20/2016   Stress reaction 04/20/2016    Past Surgical History:  Procedure Laterality Date   BACK SURGERY     ELBOW SURGERY     FOOT SURGERY     HERNIA REPAIR     NO PAST SURGERIES         Home Medications    Prior to Admission medications   Medication Sig Start Date End Date Taking? Authorizing Provider  acetaminophen-codeine (TYLENOL #3) 300-30 MG tablet TK 1 T PO Q 6 H PRF PAIN 02/15/19   [provider]  amLODipine (NORVASC) 10 MG tablet amlodipine 10  mg tablet  Take 1 tablet every day by oral route.    [provider]  cyclobenzaprine (FLEXERIL) 10 MG tablet TK 1 T PO TID PRN 01/16/19   [provider]  diclofenac sodium (VOLTAREN) 1 % GEL Apply 2 g topically 4 (four) times daily. Apply to right knee and left elbow QID for pain 03/01/19   Enrique Sack, FNP  hydrochlorothiazide (HYDRODIURIL) 25 MG tablet Take 1 tablet (25 mg total) by mouth daily. 08/15/18   Coral Spikes, DO  meloxicam (MOBIC) 15 MG tablet meloxicam 15 mg tablet  TK 1 T PO QD    [provider]  predniSONE (DELTASONE) 20 MG tablet Take 2 tablets (40 mg total) by mouth daily for 5 days. 03/01/19 03/06/19  Enrique Sack, FNP  gabapentin (NEURONTIN) 300 MG capsule gabapentin 300 mg capsule  one tab bid-tid  03/01/19  [provider]    Family History Family History  Problem Relation Age of Onset   Hypertension Mother    Heart disease Mother    Colon cancer Father     Social History Social History   Tobacco Use   Smoking status: Former Smoker    Types: Cigarettes   Smokeless tobacco: Never Used  Substance Use Topics   Alcohol use: Not Currently   Drug use:  Not Currently     Allergies   Tylenol with codeine #3 [acetaminophen-codeine], Motrin [ibuprofen], and Tramadol   Review of Systems Review of Systems  Musculoskeletal: Positive for arthralgias.  All other systems reviewed and are negative.    Physical Exam Triage Vital Signs ED Triage Vitals  Enc Vitals Group     BP 03/01/19 1402 (!) 147/102     Pulse Rate 03/01/19 1402 (!) 107     Resp 03/01/19 1402 18     Temp 03/01/19 1402 98.1 F (36.7 C)     Temp Source 03/01/19 1402 Oral     SpO2 03/01/19 1402 98 %     Weight 03/01/19 1404 212 lb 15.4 oz (96.6 kg)     Height 03/01/19 1404 6\' 5"  (1.956 m)     Head Circumference --      Peak Flow --      Pain Score 03/01/19 1404 9     Pain Loc --      Pain Edu? --      Excl. in [place]? --    No data  found.  Updated Vital Signs BP (!) 147/102 (BP Location: Right Arm)    Pulse (!) 107    Temp 98.1 F (36.7 C) (Oral)    Resp 18    Ht 6\' 5"  (1.956 m)    Wt 212 lb 15.4 oz (96.6 kg)    SpO2 98%    BMI 25.25 kg/m   Visual Acuity Right Eye Distance:   Left Eye Distance:   Bilateral Distance:    Right Eye Near:   Left Eye Near:    Bilateral Near:     Physical Exam Vitals signs reviewed.  Constitutional:      Appearance: Normal appearance.  HENT:     Head: Normocephalic.  Neck:     Musculoskeletal: Normal range of motion and neck supple.  Cardiovascular:     Rate and Rhythm: Normal rate.  Pulmonary:     Effort: Pulmonary effort is normal.  Musculoskeletal: Normal range of motion.     Left elbow: He exhibits no swelling, no effusion and no deformity. Tenderness found.     Right knee: He exhibits no swelling, no effusion, no deformity and no erythema. Tenderness found.  Skin:    General: Skin is dry.  Neurological:     General: No focal deficit present.     Mental Status: He is alert and oriented to person, place, and time.      UC Treatments / Results  Labs (all labs ordered are listed, but only abnormal results are displayed) Labs Reviewed - No data to display  EKG   Radiology No results found.  Procedures Procedures (including critical care time)  Medications Ordered in UC Medications  methylPREDNISolone sodium succinate (SOLU-MEDROL) 40 mg/mL injection 80 mg (80 mg Intramuscular Given 03/01/19 1442)    Initial Impression / Assessment and Plan / UC Course  I have reviewed the triage vital signs and the nursing notes.  Pertinent labs & imaging results that were available during my care of the patient were reviewed by me and considered in my medical decision making (see chart for details).     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 He hasn't tried anything for his  symptoms. Physical exam negative for any joint abnormalities or effusions. He has had recent imaging of both areas. Will not prescribe any narcotics  as he is seeing pain management and has received pain medication prescriptions on 9/26, 9/28 & 9/30 per [place] PMP aware. Will try supportive measures with short course of steroids and Voltaren gel. Patient advised to follow-up with ortho as scheduled later this month or sooner if needed.   Today's evaluation has revealed no signs of a dangerous process. Discussed diagnosis with patient and/or guardian. Patient and/or guardian aware of their diagnosis, possible red flag symptoms to watch out for and need for close follow up. Patient and/or guardian understands verbal and written discharge instructions. Patient and/or guardian comfortable with plan and disposition.  Patient and/or guardian has a clear mental status at this time, good insight into illness (after discussion and teaching) and has clear judgment to make decisions regarding their care  This care was provided during an unprecedented National Emergency due to the Novel Coronavirus (COVID-19) pandemic. COVID-19 infections and transmission risks place heavy strains on healthcare resources.  As this pandemic evolves, our facility, providers, and staff strive to respond fluidly, to remain operational, and to provide care relative to available resources and information. Outcomes are unpredictable and treatments are without well-defined guidelines. Further, the impact of COVID-19 on all aspects of urgent care, including the impact to patients seeking care for reasons other than COVID-19, is unavoidable during this national emergency. At this time of the global pandemic, management of patients has significantly changed, even for non-COVID positive patients given high local and regional COVID volumes at this time requiring high healthcare system and resource utilization. The standard of care for management of both  COVID suspected and non-COVID suspected patients continues to change rapidly at the local, regional, national, and global levels. This patient was worked up and treated to the best available but ever changing evidence and resources available at this current time.   Documentation was completed with the aid of voice recognition software. Transcription may contain typographical errors. Final Clinical Impressions(s) / UC Diagnoses   Final diagnoses:  Chronic pain of right knee  Elbow pain, chronic, left  Arthralgia, unspecified joint     Discharge Instructions     Continue Mobic daily. Take other medications as prescribed. Alternate between ice and heat to affected areas. Follow-up with orthopedics on 10/21 as scheduled or sooner as needed.     ED Prescriptions    Medication Sig Dispense Auth. Provider   diclofenac sodium (VOLTAREN) 1 % GEL Apply 2 g topically 4 (four) times daily. Apply to right knee and left elbow QID for pain 150 g Enrique Sack, FNP   predniSONE (DELTASONE) 20 MG tablet Take 2 tablets (40 mg total) by mouth daily for 5 days. 10 tablet Enrique Sack, FNP     PDMP not reviewed this encounter.   Enrique Sack, [place] 03/01/19 1451

## 2019-03-03 ENCOUNTER — Ambulatory Visit
Admission: EM | Admit: 2019-03-03 | Discharge: 2019-03-03 | Disposition: A | Discharge status: Home / Self Care | Payer: Medicaid Other | Attending: Emergency Medicine | Admitting: Emergency Medicine

## 2019-03-03 ENCOUNTER — Other Ambulatory Visit: Payer: Self-pay

## 2019-03-03 ENCOUNTER — Ambulatory Visit: Payer: Medicaid Other

## 2019-03-03 DIAGNOSIS — Z79899 Other long term (current) drug therapy: Secondary | ICD-10-CM | POA: Diagnosis not present

## 2019-03-03 DIAGNOSIS — I10 Essential (primary) hypertension: Secondary | ICD-10-CM | POA: Diagnosis not present

## 2019-03-03 DIAGNOSIS — M5136 Other intervertebral disc degeneration, lumbar region: Secondary | ICD-10-CM | POA: Insufficient documentation

## 2019-03-03 DIAGNOSIS — R202 Paresthesia of skin: Secondary | ICD-10-CM | POA: Insufficient documentation

## 2019-03-03 DIAGNOSIS — M25522 Pain in left elbow: Secondary | ICD-10-CM | POA: Diagnosis not present

## 2019-03-03 DIAGNOSIS — M545 Low back pain, unspecified: Secondary | ICD-10-CM

## 2019-03-03 DIAGNOSIS — Z791 Long term (current) use of non-steroidal anti-inflammatories (NSAID): Secondary | ICD-10-CM | POA: Insufficient documentation

## 2019-03-03 DIAGNOSIS — W010XXA Fall on same level from slipping, tripping and stumbling without subsequent striking against object, initial encounter: Secondary | ICD-10-CM | POA: Insufficient documentation

## 2019-03-03 DIAGNOSIS — M2550 Pain in unspecified joint: Secondary | ICD-10-CM

## 2019-03-03 DIAGNOSIS — M25561 Pain in right knee: Secondary | ICD-10-CM

## 2019-03-03 DIAGNOSIS — W19XXXA Unspecified fall, initial encounter: Secondary | ICD-10-CM

## 2019-03-03 DIAGNOSIS — Z87891 Personal history of nicotine dependence: Secondary | ICD-10-CM | POA: Diagnosis not present

## 2019-03-03 DIAGNOSIS — G8929 Other chronic pain: Secondary | ICD-10-CM | POA: Diagnosis not present

## 2019-03-03 IMAGING — CR DG LUMBAR SPINE COMPLETE 4+V
6 series · 6 of 6 positions shown · non-contrast
Comparison: Plain films lumbar spine [DATE].

CLINICAL DATA: Low back pain after a fall today. Initial encounter.

EXAM:
LUMBAR SPINE - COMPLETE 4+ VIEW

[l-spine ap]
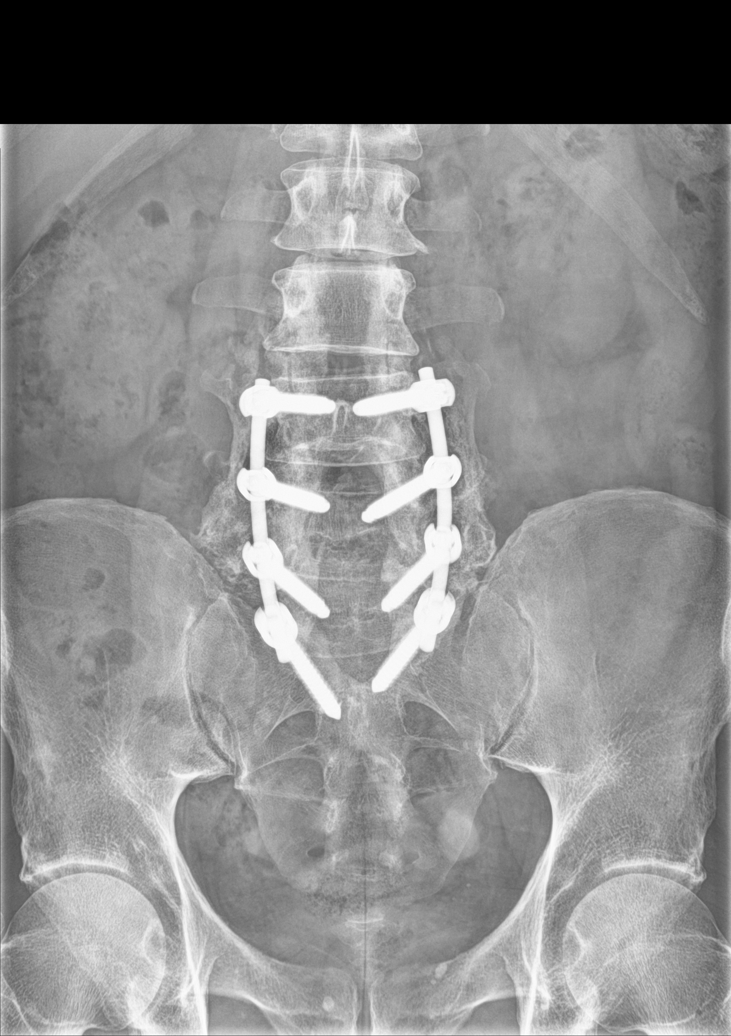

[l-spine obl (1 of 2)]
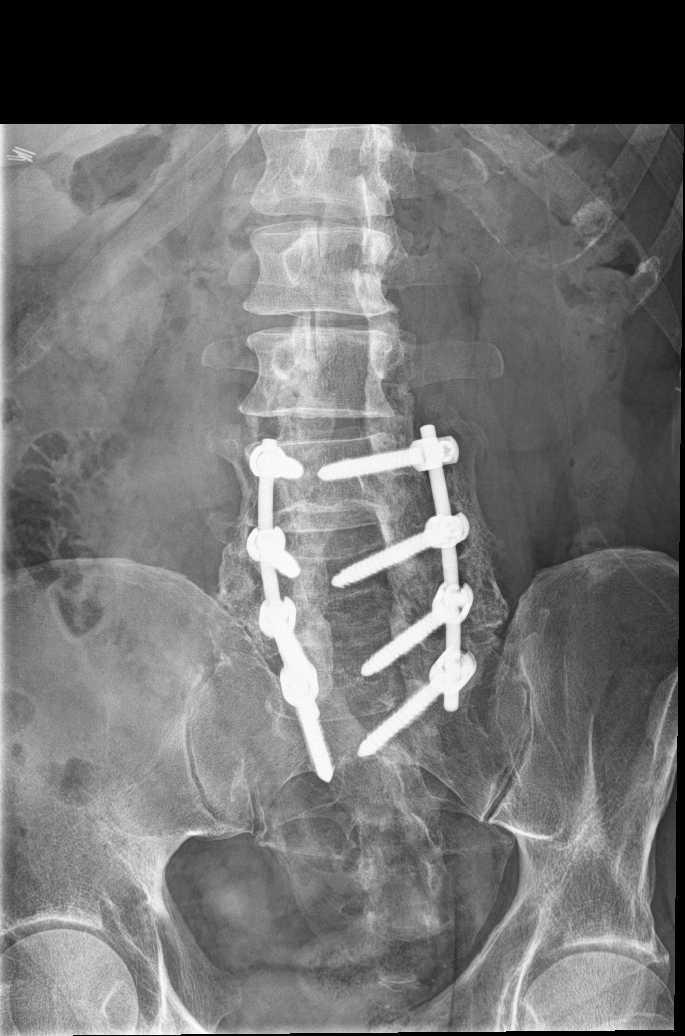

[l-spine obl (2 of 2)]
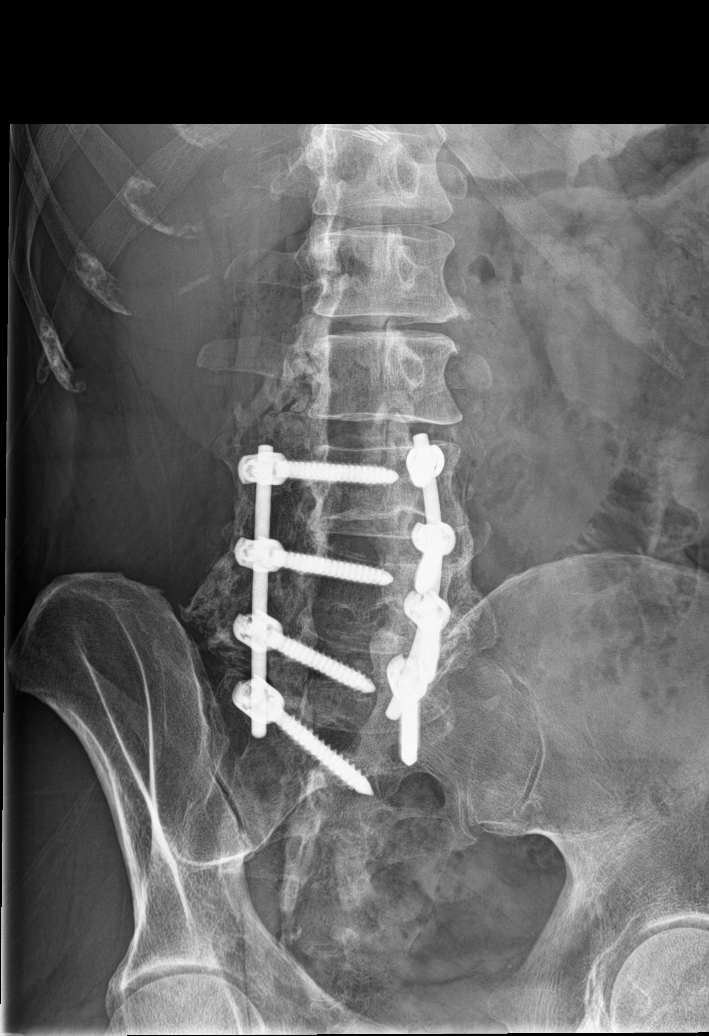

[l-spine lat (1 of 2)]
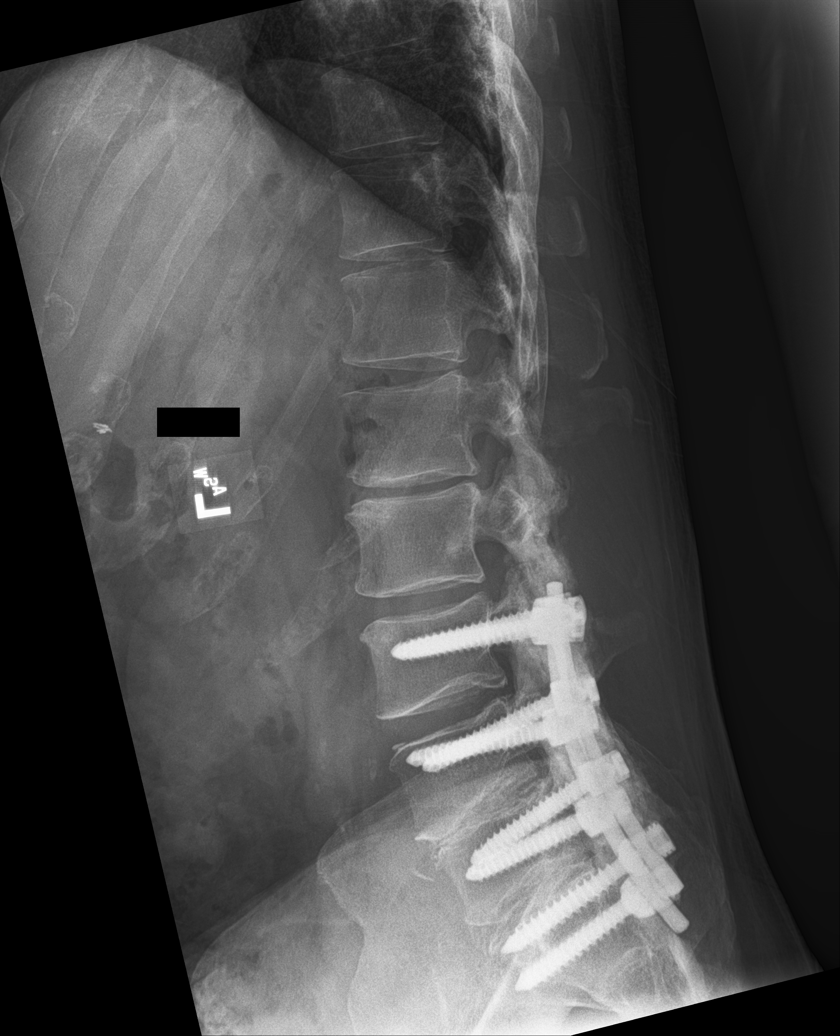

[l-spine spot]
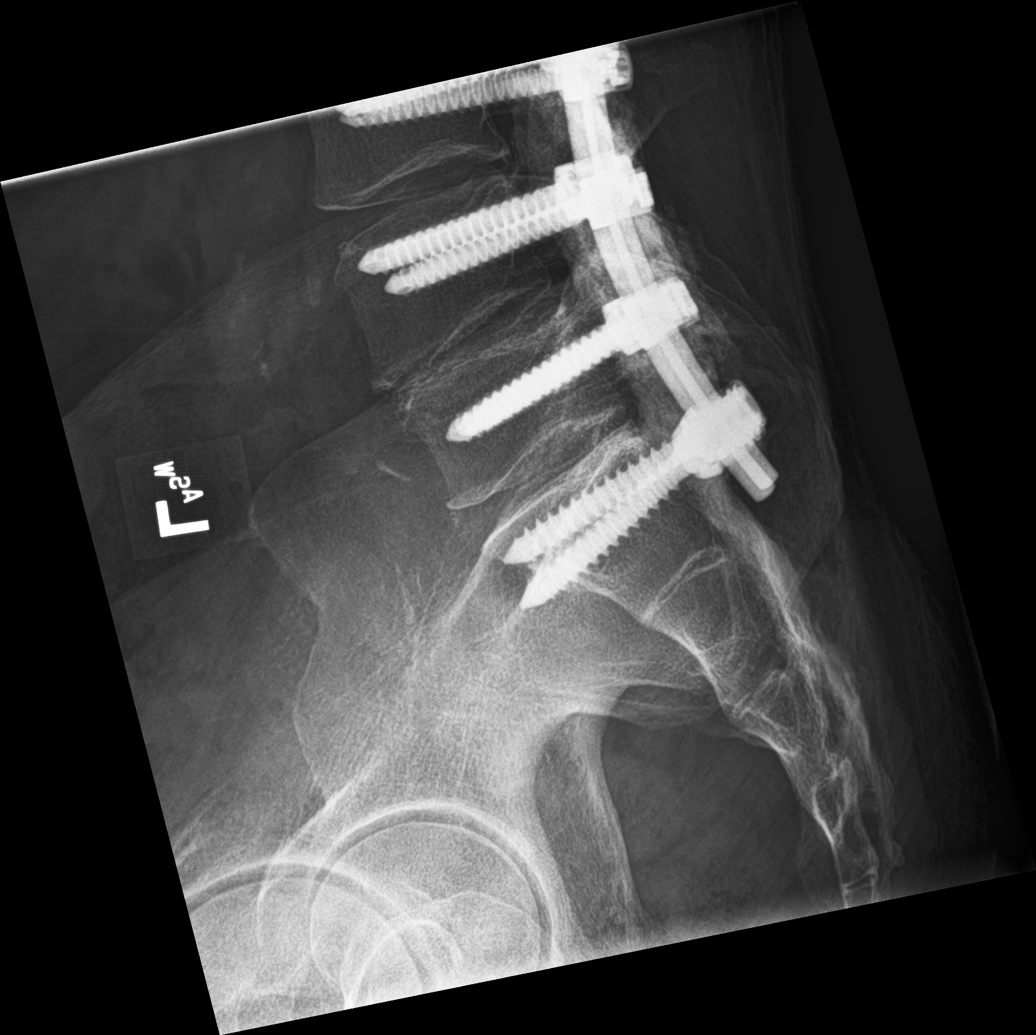

[l-spine lat (2 of 2)]
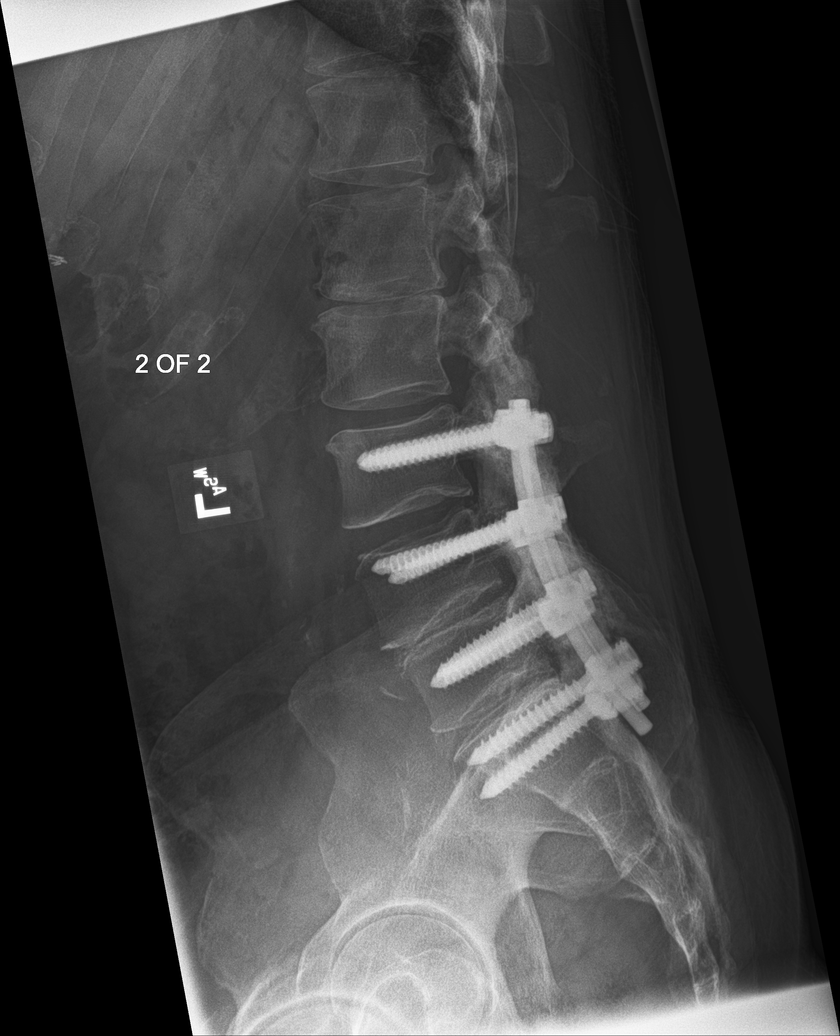

[6 of 6 positions shown; findings below may reference images not displayed]

FINDINGS: There is no fracture or malalignment. Again seen is postoperative
change of L3-S1 fusion. Degenerative disc disease L1-2 is again
seen. Paraspinous structures are unremarkable.
IMPRESSION: No acute finding.

Status post L3-S1 fusion.

## 2019-03-03 MED ORDER — KETOROLAC TROMETHAMINE 30 MG/ML IJ SOLN
30.0000 mg | Freq: Once | INTRAMUSCULAR | Status: AC
  Administered 2019-03-03: 30 mg via INTRAMUSCULAR

## 2019-03-03 NOTE — Discharge Instructions (Signed)
Your xray does not indicate any new injury or changes.  Continue with the previously prescribed meloxicam, prednisone, as well as your muscle relaxers and tylenol #3.  Please call on Monday to follow up with your surgeon.  Any worsening of symptoms- weakness, new numbness or tingling, urinary or stool incontinence, or unmanaged pain- please go to the ER.

## 2019-03-03 NOTE — ED Triage Notes (Signed)
As per patient fell and hit his back more on Right side onset today.

## 2019-03-03 NOTE — ED Provider Notes (Signed)
[facility]    CSN: [id number] Arrival date & time: 03/03/19  1619      History   Chief Complaint Chief Complaint  Patient presents with  . Back Pain  . Fall    HPI Earl Murray is a 63 y.o. male.   Cleone Slim presents with complaints of back pain, worse on right greater than left. Chronic pain s/p lumbar laminectomy, states surgery 01/03/2019. Baseline pain around 5/10 in severity. Today, his dog tripped him, causing him to fall onto his buttocks on his stairs, with his back striking a step. States he has some new tingling sensation down his posterior left leg. Denies saddle symptoms. No loss of bladder or bowel function. States has been taking advil and muscle relaxers for pain, then later says he hasn't taken any advil today. He is ambulatory. History  Of htn. Was seen here 10/7 and 9/26 for orthopedic pain (knee and elbow) as well. Per PDP receives tylenol #3 regularly, last script 9/30. No known kidney dysfunction.    ROS per HPI, negative if not otherwise mentioned.      Past Medical History:  Diagnosis Date  . Hypertension   . Neck injury     Patient Active Problem List   Diagnosis Date Noted  . Hallux valgus, acquired 04/20/2016  . 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 Stress reaction 04/20/2016    Past Surgical History:  Procedure Laterality Date  . BACK SURGERY    . ELBOW SURGERY    . FOOT SURGERY    . HERNIA REPAIR    . NO PAST SURGERIES         Home Medications    Prior to Admission medications   Medication Sig Start Date End Date Taking? Authorizing Provider  amLODipine (NORVASC) 10 MG tablet amlodipine 10 mg tablet  Take 1 tablet every day by oral route.   Yes [provider]  cyclobenzaprine (FLEXERIL) 10 MG tablet TK 1 T PO TID PRN 01/16/19  Yes [provider]  diclofenac sodium (VOLTAREN) 1 % GEL Apply 2 g topically 4 (four) times daily. Apply to right knee and left elbow QID for pain 03/01/19  Yes Enrique Sack, FNP  hydrochlorothiazide (HYDRODIURIL) 25 MG tablet Take 1 tablet (25 mg total) by mouth daily. 08/15/18  Yes Cook, Jayce G, DO  meloxicam (MOBIC) 15 MG tablet meloxicam 15 mg tablet  TK 1 T PO QD   Yes [provider]  predniSONE (DELTASONE) 20 MG tablet Take 2 tablets (40 mg total) by mouth daily for 5 days. 03/01/19 03/06/19 Yes Enrique Sack, FNP  acetaminophen-codeine (TYLENOL #3) 300-30 MG tablet TK 1 T PO Q 6 H PRF PAIN 02/15/19   [provider]  gabapentin (NEURONTIN) 300 MG capsule gabapentin 300 mg capsule  one tab bid-tid  03/01/19  [provider]    Family History Family History  Problem Relation Age of Onset  . Hypertension Mother   . Heart disease Mother   . Colon cancer Father     Social History Social History   Tobacco Use  . Smoking status: Former Smoker    Types: Cigarettes  . Smokeless tobacco: Former Network engineer Use Topics  . Alcohol use: Not Currently  . Drug use: Not Currently     Allergies   Tylenol with codeine #3 [acetaminophen-codeine], Motrin [ibuprofen], and Tramadol   Review of Systems Review of Systems   Physical Exam Triage Vital Signs ED Triage Vitals  Enc Vitals  Group     BP 03/03/19 1630 (!) 153/93     Pulse Rate 03/03/19 1630 (!) 104     Resp 03/03/19 1630 16     Temp 03/03/19 1630 98.2 F (36.8 C)     Temp Source 03/03/19 1630 Oral     SpO2 03/03/19 1630 100 %     Weight 03/03/19 1627 215 lb (97.5 kg)     Height 03/03/19 1627 6\' 5"  (1.956 m)     Head Circumference --      Peak Flow --      Pain Score 03/03/19 1627 10     Pain Loc --      Pain Edu? --      Excl. in [place]? --    No data found.  Updated Vital Signs BP (!) 153/93 (BP Location: Left Arm)   Pulse (!) 104   Temp 98.2 F (36.8 C) (Oral)   Resp 16   Ht 6\' 5"  (1.956 m)   Wt 215 lb (97.5 kg)   SpO2 100%   BMI 25.50 kg/m    Physical Exam Constitutional:      Appearance: He is well-developed.  Pulmonary:      Effort: Pulmonary effort is normal.     Breath sounds: Normal breath sounds.  Musculoskeletal:     Lumbar back: He exhibits tenderness, bony tenderness and pain. He exhibits no swelling, no edema, no deformity, no laceration, no spasm and normal pulse.       Back:     Comments: no step off or deformity to spinous processes;  strength equal bilaterally; gross sensation intact; ambulatory without difficulty   Skin:    General: Skin is warm and dry.  Neurological:     Mental Status: He is alert and oriented to person, place, and time.      UC Treatments / Results  Labs (all labs ordered are listed, but only abnormal results are displayed) Labs Reviewed - No data to display  EKG   Radiology Dg Lumbar Spine Complete  Result Date: 03/03/2019 CLINICAL DATA:  Low back pain after a fall today. Initial encounter. EXAM: LUMBAR SPINE - COMPLETE 4+ VIEW COMPARISON:  Plain films lumbar spine 07/04/2018. FINDINGS: There is no fracture or malalignment. Again seen is postoperative change of L3-S1 fusion. Degenerative disc disease L1-2 is again seen. Paraspinous structures are unremarkable. IMPRESSION: No acute finding. Status post L3-S1 fusion. Electronically Signed   By: Inge Rise M.D.   On: 03/03/2019 17:43    Procedures Procedures (including critical care time)  Medications Ordered in UC Medications  ketorolac (TORADOL) 30 MG/ML injection 30 mg (30 mg Intramuscular Given 03/03/19 1710)    Initial Impression / Assessment and Plan / UC Course  I have reviewed the triage vital signs and the nursing notes.  Pertinent labs & imaging results that were available during my care of the patient were reviewed by me and considered in my medical decision making (see chart for details).     Ambulatory without difficulty.  No red flag findings on exam. Recent surgery with trip and fall today. Xray without acute findings. Encouraged follow up with surgeon on Monday, strict er precautions provided  over the weekend. Patient with pain medication already prescribed. Return precautions provided. Patient verbalized understanding and agreeable to plan.  Ambulatory out of clinic without difficulty.    Final Clinical Impressions(s) / UC Diagnoses   Final diagnoses:  Bilateral low back pain without sciatica, unspecified chronicity  Fall, initial encounter  Discharge Instructions     Your xray does not indicate any new injury or changes.  Continue with the previously prescribed meloxicam, prednisone, as well as your muscle relaxers and tylenol #3.  Please call on Monday to follow up with your surgeon.  Any worsening of symptoms- weakness, new numbness or tingling, urinary or stool incontinence, or unmanaged pain- please go to the ER.     ED Prescriptions    None     I have reviewed the PDMP during this encounter.   Zigmund Gottron, NP 03/03/19 [phone]

## 2019-03-04 ENCOUNTER — Ambulatory Visit
Admission: EM | Admit: 2019-03-04 | Discharge: 2019-03-04 | Disposition: A | Discharge status: Home / Self Care | Payer: Medicaid Other | Attending: Family Medicine | Admitting: Family Medicine

## 2019-03-04 ENCOUNTER — Encounter: Payer: Self-pay | Admitting: Family Medicine

## 2019-03-04 ENCOUNTER — Other Ambulatory Visit: Payer: Self-pay

## 2019-03-04 DIAGNOSIS — M5442 Lumbago with sciatica, left side: Secondary | ICD-10-CM

## 2019-03-04 DIAGNOSIS — G8929 Other chronic pain: Secondary | ICD-10-CM

## 2019-03-04 MED ORDER — BACLOFEN 10 MG PO TABS: 10.0000 mg | ORAL_TABLET | Freq: Three times a day (TID) | ORAL | 0 refills | Status: DC | PRN

## 2019-03-04 NOTE — ED Triage Notes (Signed)
Patient in today c/o back pain since 03/04/19. Patient states he fell over his dog yesterday morning. Patient seen here yesterday, but states he isn't feeling any better. Has appointment with primary on 03/06/19.

## 2019-03-04 NOTE — ED Provider Notes (Signed)
[facility]    CSN: [id number] Arrival date & time: 03/04/19  1019  History   Chief Complaint Chief Complaint  Patient presents with  . Back Pain   HPI  63 year old male presents with back pain.  Patient has known chronic pain syndrome and chronic back pain.  He has had multiple back surgeries.  He had a recent surgery in August.  He was seen yesterday with complaints of low back pain.  He states that he suffered a fall after tripping over his dog.  Patient reports that he was prescribed medication and was given a Toradol injection and still has severe pain.  Patient endorses compliance with the medications that he was prescribed.  Patient states that he is no longer taking Tylenol with codeine as it makes him itch.  Patient reports radiation to his left leg.  No urinary symptoms.  No saddle anesthesia.  No other associated symptoms.  No other complaints.  PMH, Surgical Hx, Family Hx, Social History reviewed and updated as below.  Past Medical History:  Diagnosis Date  . Hypertension   . Neck injury    Patient Active Problem List   Diagnosis Date Noted  . Hallux valgus, acquired 04/20/2016  . 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 Stress reaction 04/20/2016   Past Surgical History:  Procedure Laterality Date  . BACK SURGERY    . ELBOW SURGERY    . FOOT SURGERY    . HERNIA REPAIR     Home Medications    Prior to Admission medications   Medication Sig Start Date End Date Taking? Authorizing Provider  amLODipine (NORVASC) 10 MG tablet amlodipine 10 mg tablet  Take 1 tablet every day by oral route.   Yes [provider]  diclofenac sodium (VOLTAREN) 1 % GEL Apply 2 g topically 4 (four) times daily. Apply to right knee and left elbow QID for pain 03/01/19  Yes Enrique Sack, FNP  hydrochlorothiazide (HYDRODIURIL) 25 MG tablet Take 1 tablet (25 mg total) by mouth daily. 08/15/18  Yes Jeriko Kowalke G, DO  predniSONE (DELTASONE) 20 MG tablet Take 2 tablets  (40 mg total) by mouth daily for 5 days. 03/01/19 03/06/19 Yes Enrique Sack, FNP  baclofen (LIORESAL) 10 MG tablet Take 1 tablet (10 mg total) by mouth 3 (three) times daily as needed (Back pain). 03/04/19   Coral Spikes, DO  gabapentin (NEURONTIN) 300 MG capsule gabapentin 300 mg capsule  one tab bid-tid  03/01/19  [provider]    Family History Family History  Problem Relation Age of Onset  . Hypertension Mother   . Heart disease Mother   . Colon cancer Father     Social History Social History   Tobacco Use  . Smoking status: Former Smoker    Types: Cigarettes    Quit date: 05/2018    Years since quitting: 0.7  . Smokeless tobacco: Former Network engineer Use Topics  . Alcohol use: Not Currently  . Drug use: Not Currently     Allergies   Tylenol with codeine #3 [acetaminophen-codeine], Motrin [ibuprofen], and Tramadol   Review of Systems Review of Systems  Constitutional: Negative.   Genitourinary: Negative.   Musculoskeletal: Positive for back pain.   Physical Exam Triage Vital Signs ED Triage Vitals  Enc Vitals Group     BP 03/04/19 1032 (!) 156/102     Pulse Rate 03/04/19 1032 (!) 102     Resp 03/04/19 1032 18  Temp 03/04/19 1032 98.2 F (36.8 C)     Temp Source 03/04/19 1032 Oral     SpO2 03/04/19 1032 100 %     Weight 03/04/19 1032 215 lb (97.5 kg)     Height 03/04/19 1032 6\' 5"  (1.956 m)     Head Circumference --      Peak Flow --      Pain Score 03/04/19 1031 10     Pain Loc --      Pain Edu? --      Excl. in [place]? --    Updated Vital Signs BP (!) 156/102 (BP Location: Left Arm)   Pulse (!) 102   Temp 98.2 F (36.8 C) (Oral)   Resp 18   Ht 6\' 5"  (1.956 m)   Wt 97.5 kg   SpO2 100%   BMI 25.50 kg/m   Visual Acuity Right Eye Distance:   Left Eye Distance:   Bilateral Distance:    Right Eye Near:   Left Eye Near:    Bilateral Near:     Physical Exam Vitals signs and nursing note reviewed.  Constitutional:       General: He is not in acute distress.    Appearance: Normal appearance. He is not ill-appearing.  HENT:     Head: Normocephalic and atraumatic.  Eyes:     General:        Right eye: No discharge.        Left eye: No discharge.     Conjunctiva/sclera: Conjunctivae normal.  Cardiovascular:     Rate and Rhythm: Regular rhythm. Tachycardia present.  Pulmonary:     Effort: Pulmonary effort is normal.     Breath sounds: Normal breath sounds. No wheezing, rhonchi or rales.  Musculoskeletal:     Comments: Lumbar spine - surgical scar noted (midline). Muscle spasm noted. Decreased ROM due to pain and prior fusion.  Neurological:     Mental Status: He is alert.  Psychiatric:        Mood and Affect: Mood normal.        Behavior: Behavior normal.    UC Treatments / Results  Labs (all labs ordered are listed, but only abnormal results are displayed) Labs Reviewed - No data to display  EKG   Radiology Dg Lumbar Spine Complete  Result Date: 03/03/2019 CLINICAL DATA:  Low back pain after a fall today. Initial encounter. EXAM: LUMBAR SPINE - COMPLETE 4+ VIEW COMPARISON:  Plain films lumbar spine 07/04/2018. FINDINGS: There is no fracture or malalignment. Again seen is postoperative change of L3-S1 fusion. Degenerative disc disease L1-2 is again seen. Paraspinous structures are unremarkable. IMPRESSION: No acute finding. Status post L3-S1 fusion. Electronically Signed   By: Inge Rise M.D.   On: 03/03/2019 17:43    Procedures Procedures (including critical care time)  Medications Ordered in UC Medications - No data to display  Initial Impression / Assessment and Plan / UC Course  I have reviewed the triage vital signs and the nursing notes.  Pertinent labs & imaging results that were available during my care of the patient were reviewed by me and considered in my medical decision making (see chart for details).    63 year old male presents with acute on chronic low back pain.   Stopping Flexeril.  Stopping Mobic.  Continue prednisone.  Prescribed baclofen.  I am not prescribing any additional narcotic medications.  There are red flags based off of my review of the [place] controlled substance database.  Patient  needs follow-up with neurosurgeon.  Final Clinical Impressions(s) / UC Diagnoses   Final diagnoses:  Chronic low back pain with left-sided sciatica, unspecified back pain laterality     Discharge Instructions     Rest.  Ice and heat.  Stop the flexeril.  Baclofen as prescribed.  Stop the mobic.   Continue the prednisone.  Call your neurosurgeon with concerns.  Take care     ED Prescriptions    Medication Sig Dispense Auth. Provider   baclofen (LIORESAL) 10 MG tablet Take 1 tablet (10 mg total) by mouth 3 (three) times daily as needed (Back pain). 8 each Coral Spikes, DO     I have reviewed the PDMP during this encounter.   Coral Spikes, [place] 03/04/19 1130

## 2019-03-04 NOTE — Discharge Instructions (Addendum)
Rest.  Ice and heat.  Stop the flexeril.  Baclofen as prescribed.  Stop the mobic.   Continue the prednisone.  Call your neurosurgeon with concerns.  Take care

## 2019-08-13 ENCOUNTER — Other Ambulatory Visit: Payer: Self-pay

## 2019-08-13 ENCOUNTER — Encounter: Payer: Self-pay | Admitting: Emergency Medicine

## 2019-08-13 ENCOUNTER — Ambulatory Visit
Admission: EM | Admit: 2019-08-13 | Discharge: 2019-08-13 | Disposition: A | Discharge status: Home / Self Care | Payer: Medicare Other

## 2019-08-13 DIAGNOSIS — K047 Periapical abscess without sinus: Secondary | ICD-10-CM

## 2019-08-13 MED ORDER — AMOXICILLIN-POT CLAVULANATE 875-125 MG PO TABS: 1.0000 | ORAL_TABLET | Freq: Two times a day (BID) | ORAL | 0 refills | Status: DC

## 2019-08-13 MED ORDER — LIDOCAINE VISCOUS HCL 2 % MT SOLN: 15.0000 mL | OROMUCOSAL | 0 refills | Status: DC | PRN

## 2019-08-13 NOTE — Discharge Instructions (Signed)
Make arrangements to see a dentist early this week.  Use lidocaine viscus gel for pain control.  May apply directly to the swollen gum area.  Use every 3 hours as necessary.

## 2019-08-13 NOTE — ED Triage Notes (Signed)
Patient in today c/o gum swelling x 1 week. Patient states that his top gums are swollen over his teeth. Patient has been taking Percocet 10 mg as prescribed without relief.

## 2019-08-28 ENCOUNTER — Other Ambulatory Visit: Payer: Self-pay

## 2019-08-28 ENCOUNTER — Ambulatory Visit
Admission: EM | Admit: 2019-08-28 | Discharge: 2019-08-28 | Disposition: A | Discharge status: Home / Self Care | Payer: Medicare Other | Attending: Family Medicine | Admitting: Family Medicine

## 2019-08-28 DIAGNOSIS — M25562 Pain in left knee: Secondary | ICD-10-CM | POA: Diagnosis not present

## 2019-08-28 DIAGNOSIS — M25561 Pain in right knee: Secondary | ICD-10-CM

## 2019-08-28 DIAGNOSIS — G8929 Other chronic pain: Secondary | ICD-10-CM | POA: Diagnosis not present

## 2019-08-28 DIAGNOSIS — J0100 Acute maxillary sinusitis, unspecified: Secondary | ICD-10-CM

## 2019-08-28 MED ORDER — AMOXICILLIN-POT CLAVULANATE 875-125 MG PO TABS: 1.0000 | ORAL_TABLET | Freq: Two times a day (BID) | ORAL | 0 refills | Status: DC

## 2019-08-28 MED ORDER — KETOROLAC TROMETHAMINE 60 MG/2ML IM SOLN
30.0000 mg | Freq: Once | INTRAMUSCULAR | Status: AC
  Administered 2019-08-28: 30 mg via INTRAMUSCULAR

## 2019-08-28 NOTE — ED Triage Notes (Signed)
Pt states 2 weeks of sinus issues. Nasal congestion and facial pressure. Tried OTC meds without any relief. No fever. Pt also has bilateral knee pain and is being treated for same but states it is not helping

## 2019-08-28 NOTE — Discharge Instructions (Signed)
Antibiotic as prescribed.  Follow up with ortho.   Take care  Dr. Lacinda Axon

## 2019-08-28 NOTE — ED Provider Notes (Signed)
[facility]    CSN: [id number] Arrival date & time: 08/28/19  0910      History   Chief Complaint Chief Complaint  Patient presents with  . Sinus Problem  . Knee Pain   HPI  64 year old male presents with the above complaints.  2-week history of sinus pressure and congestion.  Patient reports that he has tried numerous over-the-counter treatments without resolution.  Reports clear rhinorrhea.  No relieving factors.  No fever.  No reported sick contacts.  No known exacerbating factors.  Additionally, patient reports ongoing knee pain bilaterally.  He is followed by an orthopedist.  He has chronic pain syndrome and is on chronic narcotics.  Patient requesting Toradol for pain relief today.  Past Medical History:  Diagnosis Date  . Hypertension   . Neck injury   Chronic pain Knee OA Lumbar disc disease  Past Surgical History:  Procedure Laterality Date  . BACK SURGERY    . ELBOW SURGERY    . FOOT SURGERY    . HERNIA REPAIR      Home Medications    Prior to Admission medications   Medication Sig Start Date End Date Taking? Authorizing Provider  amLODipine (NORVASC) 10 MG tablet amlodipine 10 mg tablet  Take 1 tablet every day by oral route.    [provider]  amoxicillin-clavulanate (AUGMENTIN) 875-125 MG tablet Take 1 tablet by mouth every 12 (twelve) hours. 08/28/19   Coral Spikes, DO  baclofen (LIORESAL) 10 MG tablet Take 1 tablet (10 mg total) by mouth 3 (three) times daily as needed (Back pain). 03/04/19   Coral Spikes, DO  hydrochlorothiazide (HYDRODIURIL) 25 MG tablet Take 1 tablet (25 mg total) by mouth daily. 08/15/18   Coral Spikes, DO  lidocaine (XYLOCAINE) 2 % solution Use as directed 15 mLs in the mouth or throat as needed for mouth pain. 08/13/19   Lorin Picket, PA-C  oxyCODONE-acetaminophen (PERCOCET) 10-325 MG tablet Take 1 tablet by mouth every 6 (six) hours as needed. 08/05/19   [provider]  gabapentin (NEURONTIN) 300  MG capsule gabapentin 300 mg capsule  one tab bid-tid  03/01/19  [provider]    Family History Family History  Problem Relation Age of Onset  . Hypertension Mother   . Heart disease Mother   . Colon cancer Father     Social History Social History   Tobacco Use  . Smoking status: Former Smoker    Types: Cigarettes    Quit date: 05/2018    Years since quitting: 1.2  . Smokeless tobacco: Former Network engineer Use Topics  . Alcohol use: Not Currently  . Drug use: Not Currently     Allergies   Tylenol with codeine #3 [acetaminophen-codeine], Motrin [ibuprofen], and Tramadol   Review of Systems Review of Systems  Constitutional: Negative for fever.  HENT: Positive for congestion, sinus pressure and sinus pain.   Musculoskeletal:       Knee pain.   Physical Exam Triage Vital Signs ED Triage Vitals  Enc Vitals Group     BP 08/28/19 0923 (!) 176/97     Pulse Rate 08/28/19 0923 66     Resp --      Temp 08/28/19 0923 98.2 F (36.8 C)     Temp Source 08/28/19 0923 Oral     SpO2 08/28/19 0923 100 %     Weight 08/28/19 0921 217 lb (98.4 kg)     Height 08/28/19 0921 6\' 5"  (1.956  m)     Head Circumference --      Peak Flow --      Pain Score 08/28/19 0921 8     Pain Loc --      Pain Edu? --      Excl. in [place]? --    Updated Vital Signs BP (!) 176/97 (BP Location: Left Arm) Comment: has not taken B/P med today  Pulse 66   Temp 98.2 F (36.8 C) (Oral)   Ht 6\' 5"  (1.956 m)   Wt 98.4 kg   SpO2 100%   BMI 25.73 kg/m   Visual Acuity Right Eye Distance:   Left Eye Distance:   Bilateral Distance:    Right Eye Near:   Left Eye Near:    Bilateral Near:     Physical Exam Vitals and nursing note reviewed.  Constitutional:      General: He is not in acute distress.    Appearance: Normal appearance. He is not ill-appearing.  HENT:     Head: Normocephalic and atraumatic.     Right Ear: Tympanic membrane normal.     Left Ear: Tympanic membrane normal.      Nose:     Comments: Maxillary sinus tender to palpation. Eyes:     General:        Right eye: No discharge.        Left eye: No discharge.     Conjunctiva/sclera: Conjunctivae normal.  Cardiovascular:     Rate and Rhythm: Normal rate and regular rhythm.  Pulmonary:     Effort: Pulmonary effort is normal. No respiratory distress.  Neurological:     Mental Status: He is alert.  Psychiatric:        Mood and Affect: Mood normal.        Behavior: Behavior normal.    UC Treatments / Results  Labs (all labs ordered are listed, but only abnormal results are displayed) Labs Reviewed - No data to display  EKG   Radiology No results found.  Procedures Procedures (including critical care time)  Medications Ordered in UC Medications  ketorolac (TORADOL) injection 30 mg (30 mg Intramuscular Given 08/28/19 0939)    Initial Impression / Assessment and Plan / UC Course  I have reviewed the triage vital signs and the nursing notes.  Pertinent labs & imaging results that were available during my care of the patient were reviewed by me and considered in my medical decision making (see chart for details).    64 year old male presents with sinusitis.  Treating with Augmentin.  Regarding his chronic knee pain, I advised him to follow-up with his orthopedic physician.  He has recently is received a steroid injection.  Toradol given today.  Supportive care.  Final Clinical Impressions(s) / UC Diagnoses   Final diagnoses:  Acute maxillary sinusitis, recurrence not specified  Chronic pain of both knees     Discharge Instructions     Antibiotic as prescribed.  Follow up with ortho.   Take care  Dr. Lacinda Axon    ED Prescriptions    Medication Sig Dispense Auth. Provider   amoxicillin-clavulanate (AUGMENTIN) 875-125 MG tablet Take 1 tablet by mouth every 12 (twelve) hours. 20 tablet Coral Spikes, DO     PDMP not reviewed this encounter.   Coral Spikes, DO 08/28/19 1024

## 2019-09-02 ENCOUNTER — Encounter: Payer: Self-pay | Admitting: Emergency Medicine

## 2019-09-02 ENCOUNTER — Other Ambulatory Visit: Payer: Self-pay

## 2019-09-02 ENCOUNTER — Ambulatory Visit
Admission: EM | Admit: 2019-09-02 | Discharge: 2019-09-02 | Disposition: A | Discharge status: Home / Self Care | Payer: Medicare Other | Attending: Orthopedic Surgery | Admitting: Orthopedic Surgery

## 2019-09-02 DIAGNOSIS — M25561 Pain in right knee: Secondary | ICD-10-CM

## 2019-09-02 DIAGNOSIS — M170 Bilateral primary osteoarthritis of knee: Secondary | ICD-10-CM | POA: Diagnosis not present

## 2019-09-02 DIAGNOSIS — M25562 Pain in left knee: Secondary | ICD-10-CM | POA: Diagnosis not present

## 2019-09-02 MED ORDER — PREDNISONE 10 MG PO TABS: 10.0000 mg | ORAL_TABLET | Freq: Every day | ORAL | 0 refills | Status: DC

## 2019-09-02 MED ORDER — KETOROLAC TROMETHAMINE 30 MG/ML IJ SOLN
30.0000 mg | Freq: Once | INTRAMUSCULAR | Status: AC
  Administered 2019-09-02: 11:00:00 30 mg via INTRAMUSCULAR

## 2019-09-02 NOTE — ED Provider Notes (Signed)
[facility]    CSN: [id number] Arrival date & time: 09/02/19  1016      History   Chief Complaint Chief Complaint  Patient presents with  . Knee Pain    bilateral    HPI Earl Murray is a 64 y.o. male presents to the urgent care facility for evaluation of bilateral knee pain.  He states he was diagnosed with bone-on-bone osteoarthritis 2 weeks ago.  He states 2 weeks ago he received bilateral knee intra-articular cortisone injections with only a couple days with relief.  He is scheduled to go back this Friday for recheck.  No new trauma or injury.  His pain is under both kneecaps, he describes aching pain and grinding and giving away sensation bilaterally.  He denies any blood thinners, kidney issues.  His pain is moderate.  Not taking any NSAIDs.  He is on chronic pain medication, oxycodone 10 mg 4 times daily.  No warmth redness or fevers. HPI  Past Medical History:  Diagnosis Date  . Hypertension   . Neck injury     Patient Active Problem List   Diagnosis Date Noted  . Hallux valgus, acquired 04/20/2016  . 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 Stress reaction 04/20/2016    Past Surgical History:  Procedure Laterality Date  . BACK SURGERY    . ELBOW SURGERY    . FOOT SURGERY    . HERNIA REPAIR         Home Medications    Prior to Admission medications   Medication Sig Start Date End Date Taking? Authorizing Provider  amLODipine (NORVASC) 10 MG tablet amlodipine 10 mg tablet  Take 1 tablet every day by oral route.   Yes [provider]  baclofen (LIORESAL) 10 MG tablet Take 1 tablet (10 mg total) by mouth 3 (three) times daily as needed (Back pain). 03/04/19  Yes Cook, Jayce G, DO  hydrochlorothiazide (HYDRODIURIL) 25 MG tablet Take 1 tablet (25 mg total) by mouth daily. 08/15/18  Yes Cook, Jayce G, DO  oxyCODONE-acetaminophen (PERCOCET) 10-325 MG tablet Take 1 tablet by mouth every 6 (six) hours as needed. 08/05/19  Yes [provider]   amoxicillin-clavulanate (AUGMENTIN) 875-125 MG tablet Take 1 tablet by mouth every 12 (twelve) hours. 08/28/19   Coral Spikes, DO  lidocaine (XYLOCAINE) 2 % solution Use as directed 15 mLs in the mouth or throat as needed for mouth pain. 08/13/19   Lorin Picket, PA-C  predniSONE (DELTASONE) 10 MG tablet Take 1 tablet (10 mg total) by mouth daily. 6,5,4,3,2,1 six day taper 09/02/19   Duanne Guess, PA-C  gabapentin (NEURONTIN) 300 MG capsule gabapentin 300 mg capsule  one tab bid-tid  03/01/19  [provider]    Family History Family History  Problem Relation Age of Onset  . Hypertension Mother   . Heart disease Mother   . Colon cancer Father     Social History Social History   Tobacco Use  . Smoking status: Former Smoker    Types: Cigarettes    Quit date: 05/2018    Years since quitting: 1.2  . Smokeless tobacco: Former Network engineer Use Topics  . Alcohol use: Not Currently  . Drug use: Not Currently     Allergies   Tylenol with codeine #3 [acetaminophen-codeine], Motrin [ibuprofen], and Tramadol   Review of Systems Review of Systems  Constitutional: Negative for chills and fever.  Respiratory: Negative for shortness of breath.   Cardiovascular: Negative for  leg swelling.  Musculoskeletal: Positive for arthralgias. Negative for gait problem and joint swelling.  Skin: Negative for rash and wound.     Physical Exam Triage Vital Signs ED Triage Vitals  Enc Vitals Group     BP 09/02/19 1030 (!) 167/85     Pulse Rate 09/02/19 1030 80     Resp 09/02/19 1030 16     Temp 09/02/19 1030 98.1 F (36.7 C)     Temp Source 09/02/19 1030 Oral     SpO2 09/02/19 1030 99 %     Weight 09/02/19 1027 217 lb (98.4 kg)     Height 09/02/19 1027 6\' 5"  (1.956 m)     Head Circumference --      Peak Flow --      Pain Score 09/02/19 1027 10     Pain Loc --      Pain Edu? --      Excl. in [place]? --    No data found.  Updated Vital Signs BP (!) 167/85 (BP Location:  Right Arm) Comment: Patient states that he has not taken his BP medicine today  Pulse 80   Temp 98.1 F (36.7 C) (Oral)   Resp 16   Ht 6\' 5"  (1.956 m)   Wt 217 lb (98.4 kg)   SpO2 99%   BMI 25.73 kg/m   Visual Acuity Right Eye Distance:   Left Eye Distance:   Bilateral Distance:    Right Eye Near:   Left Eye Near:    Bilateral Near:     Physical Exam Constitutional:      Appearance: He is well-developed.  HENT:     Head: Normocephalic and atraumatic.  Eyes:     Conjunctiva/sclera: Conjunctivae normal.  Cardiovascular:     Rate and Rhythm: Normal rate.  Pulmonary:     Effort: Pulmonary effort is normal. No respiratory distress.  Musculoskeletal:        General: Normal range of motion.     Cervical back: Normal range of motion.     Comments: Bilateral knees normal active extension and flexion.  No effusion.  Patellofemoral crepitus.  Tender along the medial joint lines bilaterally.  No swelling or edema through both lower extremities.  No warmth or redness noted.  Knee stable to valgus varus stress testing.  No stiffness or discomfort with hip internal or external rotation.  Skin:    General: Skin is warm.     Findings: No rash.  Neurological:     Mental Status: He is alert and oriented to person, place, and time.  Psychiatric:        Behavior: Behavior normal.        Thought Content: Thought content normal.      UC Treatments / Results  Labs (all labs ordered are listed, but only abnormal results are displayed) Labs Reviewed - No data to display  EKG   Radiology No results found.  Procedures Procedures (including critical care time)  Medications Ordered in UC Medications  ketorolac (TORADOL) 30 MG/ML injection 30 mg (has no administration in time range)    Initial Impression / Assessment and Plan / UC Course  I have reviewed the triage vital signs and the nursing notes.  Pertinent labs & imaging results that were available during my care of the  patient were reviewed by me and considered in my medical decision making (see chart for details).    64 year old male with bilateral knee osteoarthritis.  No signs of infection.  No  trauma or injury.  Cortisone injections 2 weeks ago with no improvement.  Has follow-up appointment next week with orthopedics.  Today he is given Toradol 30 mg IM, we will rest ice and elevate the knees.  He is also placed on a 6-day steroid taper.  He gets a refill of his chronic pain medication, oxycodone tomorrow.  He understands signs symptoms return to the clinic for. Final Clinical Impressions(s) / UC Diagnoses   Final diagnoses:  Pain in both knees, unspecified chronicity  Primary osteoarthritis of both knees     Discharge Instructions     Please take medication as prescribed.  Follow-up with orthopedics next week.  Rest ice and elevate the knees.  Return to urgent care for any worsening symptoms or urgent changes in your health.   ED Prescriptions    Medication Sig Dispense Auth. Provider   predniSONE (DELTASONE) 10 MG tablet Take 1 tablet (10 mg total) by mouth daily. 6,5,4,3,2,1 six day taper 21 tablet Duanne Guess, PA-C     PDMP not reviewed this encounter.   Duanne Guess, PA-C 09/02/19 1047

## 2019-09-02 NOTE — ED Triage Notes (Signed)
Patient c/o bilateral pain in both knees for a year.  Patient states that his pain has gotten worse.  Patient states that he had a steroid injection in his knee a week ago.  Patient states that he follows up with his Orthopedic physician next Friday.

## 2019-09-02 NOTE — Discharge Instructions (Addendum)
Please take medication as prescribed.  Follow-up with orthopedics next week.  Rest ice and elevate the knees.  Return to urgent care for any worsening symptoms or urgent changes in your health.

## 2019-09-30 ENCOUNTER — Other Ambulatory Visit: Payer: Self-pay

## 2019-09-30 ENCOUNTER — Encounter: Payer: Self-pay | Admitting: Emergency Medicine

## 2019-09-30 ENCOUNTER — Ambulatory Visit
Admission: EM | Admit: 2019-09-30 | Discharge: 2019-09-30 | Disposition: A | Discharge status: Home / Self Care | Payer: Medicare Other | Attending: Family Medicine | Admitting: Family Medicine

## 2019-09-30 DIAGNOSIS — M170 Bilateral primary osteoarthritis of knee: Secondary | ICD-10-CM

## 2019-09-30 DIAGNOSIS — J309 Allergic rhinitis, unspecified: Secondary | ICD-10-CM

## 2019-09-30 DIAGNOSIS — R0982 Postnasal drip: Secondary | ICD-10-CM

## 2019-09-30 MED ORDER — IPRATROPIUM BROMIDE 0.06 % NA SOLN: 2.0000 | Freq: Four times a day (QID) | NASAL | 0 refills | Status: DC | PRN

## 2019-09-30 MED ORDER — LEVOCETIRIZINE DIHYDROCHLORIDE 5 MG PO TABS: 5.0000 mg | ORAL_TABLET | Freq: Every evening | ORAL | 0 refills | Status: DC

## 2019-09-30 MED ORDER — KETOROLAC TROMETHAMINE 60 MG/2ML IM SOLN
30.0000 mg | Freq: Once | INTRAMUSCULAR | Status: AC
  Administered 2019-09-30: 09:00:00 30 mg via INTRAMUSCULAR

## 2019-09-30 NOTE — ED Triage Notes (Signed)
Patient c/o bilateral knee pain for years.  Patient states that he gets steroid injections for his knees.  Patient also reports sinus congestion and pressure months.  Patient denies fevers.

## 2019-09-30 NOTE — Discharge Instructions (Signed)
Medication as prescribed.  Follow up with Ortho.  Take care  Dr. Lacinda Axon

## 2019-09-30 NOTE — ED Provider Notes (Signed)
[facility]    CSN: [id number] Arrival date & time: 09/30/19  0835  History   Chief Complaint Chief Complaint  Patient presents with  . Knee Pain    bilateral   HPI  64 year old male with bilateral knee osteoarthritis who is followed by orthopedics presents with bilateral knee pain.  Patient also reports he has had sinus congestion and cough.  Patient followed by orthopedics.  He states that he has recently been approved for gel injections.  He is on chronic pain medication.  He reports worsening knee pain.  He reports that his pain is 10/10 in severity.  He states that he has good response to Toradol and would like a Toradol injection today.  Additionally, patient reports ongoing sinus pressure/congestion, postnasal drip, and cough.  Recently seen by me on 4/5 and was treated for sinusitis.  Patient states that he improved but has not fully resolved.  No relieving factors.   Past Medical History:  Diagnosis Date  . Hypertension   . Neck injury    Patient Active Problem List   Diagnosis Date Noted  . Hallux valgus, acquired 04/20/2016  . 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 Stress reaction 04/20/2016   Past Surgical History:  Procedure Laterality Date  . BACK SURGERY    . ELBOW SURGERY    . FOOT SURGERY    . HERNIA REPAIR     Home Medications    Prior to Admission medications   Medication Sig Start Date End Date Taking? Authorizing Provider  amLODipine (NORVASC) 10 MG tablet amlodipine 10 mg tablet  Take 1 tablet every day by oral route.   Yes [provider]  baclofen (LIORESAL) 10 MG tablet Take 1 tablet (10 mg total) by mouth 3 (three) times daily as needed (Back pain). 03/04/19  Yes Furkan Keenum G, DO  hydrochlorothiazide (HYDRODIURIL) 25 MG tablet Take 1 tablet (25 mg total) by mouth daily. 08/15/18  Yes Bobbe Quilter G, DO  oxyCODONE-acetaminophen (PERCOCET) 10-325 MG tablet Take 1 tablet by mouth every 6 (six) hours as needed. 08/05/19  Yes  [provider]  ipratropium (ATROVENT) 0.06 % nasal spray Place 2 sprays into both nostrils 4 (four) times daily as needed for rhinitis. 09/30/19   Coral Spikes, DO  levocetirizine (XYZAL) 5 MG tablet Take 1 tablet (5 mg total) by mouth every evening. 09/30/19   Coral Spikes, DO  lidocaine (XYLOCAINE) 2 % solution Use as directed 15 mLs in the mouth or throat as needed for mouth pain. 08/13/19   Lorin Picket, PA-C  gabapentin (NEURONTIN) 300 MG capsule gabapentin 300 mg capsule  one tab bid-tid  03/01/19  [provider]    Family History Family History  Problem Relation Age of Onset  . Hypertension Mother   . Heart disease Mother   . Colon cancer Father     Social History Social History   Tobacco Use  . Smoking status: Former Smoker    Types: Cigarettes    Quit date: 05/2018    Years since quitting: 1.3  . Smokeless tobacco: Former Network engineer Use Topics  . Alcohol use: Not Currently  . Drug use: Not Currently     Allergies   Tylenol with codeine #3 [acetaminophen-codeine], Motrin [ibuprofen], and Tramadol   Review of Systems Review of Systems  Constitutional: Negative for fever.  HENT: Positive for congestion, postnasal drip and sinus pressure.   Respiratory: Positive for cough.   Musculoskeletal:  Knee pain.   Physical Exam Triage Vital Signs ED Triage Vitals  Enc Vitals Group     BP 09/30/19 0853 (S) (!) 173/110     Pulse Rate 09/30/19 0853 62     Resp 09/30/19 0853 16     Temp 09/30/19 0853 98.2 F (36.8 C)     Temp Source 09/30/19 0853 Oral     SpO2 09/30/19 0853 99 %     Weight 09/30/19 0850 208 lb (94.3 kg)     Height 09/30/19 0850 6\' 5"  (1.956 m)     Head Circumference --      Peak Flow --      Pain Score 09/30/19 0850 10     Pain Loc --      Pain Edu? --      Excl. in [place]? --    Updated Vital Signs BP (S) (!) 173/110 (BP Location: Right Arm)   Pulse 62   Temp 98.2 F (36.8 C) (Oral)   Resp 16   Ht 6\' 5"  (1.956  m)   Wt 94.3 kg   SpO2 99%   BMI 24.67 kg/m   Visual Acuity Right Eye Distance:   Left Eye Distance:   Bilateral Distance:    Right Eye Near:   Left Eye Near:    Bilateral Near:     Physical Exam Vitals and nursing note reviewed.  Constitutional:      General: He is not in acute distress.    Appearance: Normal appearance. He is not ill-appearing.  HENT:     Head: Normocephalic and atraumatic.  Eyes:     General:        Right eye: No discharge.        Left eye: No discharge.     Conjunctiva/sclera: Conjunctivae normal.  Cardiovascular:     Rate and Rhythm: Normal rate and regular rhythm.     Heart sounds: No murmur.  Pulmonary:     Effort: Pulmonary effort is normal.     Breath sounds: Normal breath sounds. No wheezing, rhonchi or rales.  Neurological:     Mental Status: He is alert.  Psychiatric:        Mood and Affect: Mood normal.        Behavior: Behavior normal.    UC Treatments / Results  Labs (all labs ordered are listed, but only abnormal results are displayed) Labs Reviewed - No data to display  EKG   Radiology No results found.  Procedures Procedures (including critical care time)  Medications Ordered in UC Medications  ketorolac (TORADOL) injection 30 mg (30 mg Intramuscular Given 09/30/19 0923)    Initial Impression / Assessment and Plan / UC Course  I have reviewed the triage vital signs and the nursing notes.  Pertinent labs & imaging results that were available during my care of the patient were reviewed by me and considered in my medical decision making (see chart for details).    64 year old male presents with chronic knee pain secondary to osteoarthritis.  Toradol given today.  Patient also having ongoing respiratory symptoms.  Consistent with allergic rhinitis and postnasal drip.  Treating with Atrovent nasal spray and Xyzal.  No indications for antibiotic therapy at this time.  Patient recently prescribed Augmentin last month.  Final  Clinical Impressions(s) / UC Diagnoses   Final diagnoses:  Primary osteoarthritis of both knees  Allergic rhinitis, unspecified seasonality, unspecified trigger  Post-nasal drip     Discharge Instructions     Medication as  prescribed.  Follow up with Ortho.  Take care  Dr. Lacinda Axon    ED Prescriptions    Medication Sig Dispense Auth. Provider   ipratropium (ATROVENT) 0.06 % nasal spray Place 2 sprays into both nostrils 4 (four) times daily as needed for rhinitis. 15 mL Aalaysia Liggins G, DO   levocetirizine (XYZAL) 5 MG tablet Take 1 tablet (5 mg total) by mouth every evening. 30 tablet Coral Spikes, DO     PDMP not reviewed this encounter.   Coral Spikes, DO 09/30/19 1001

## 2019-10-22 ENCOUNTER — Other Ambulatory Visit: Payer: Self-pay

## 2019-10-22 ENCOUNTER — Encounter: Payer: Self-pay | Admitting: Emergency Medicine

## 2019-10-22 ENCOUNTER — Ambulatory Visit (INDEPENDENT_AMBULATORY_CARE_PROVIDER_SITE_OTHER)
Admission: EM | Admit: 2019-10-22 | Discharge: 2019-10-22 | Disposition: A | Discharge status: Home / Self Care | Payer: Medicare Other | Source: Home / Self Care

## 2019-10-22 DIAGNOSIS — Z760 Encounter for issue of repeat prescription: Secondary | ICD-10-CM | POA: Diagnosis not present

## 2019-10-22 DIAGNOSIS — J309 Allergic rhinitis, unspecified: Secondary | ICD-10-CM | POA: Diagnosis not present

## 2019-10-22 DIAGNOSIS — M170 Bilateral primary osteoarthritis of knee: Secondary | ICD-10-CM | POA: Diagnosis not present

## 2019-10-22 MED ORDER — IPRATROPIUM BROMIDE 0.06 % NA SOLN: 2.0000 | Freq: Four times a day (QID) | NASAL | 1 refills | Status: DC | PRN

## 2019-10-22 MED ORDER — KETOROLAC TROMETHAMINE 30 MG/ML IJ SOLN
30.0000 mg | Freq: Once | INTRAMUSCULAR | Status: AC
  Administered 2019-10-22: 30 mg via INTRAMUSCULAR

## 2019-10-22 NOTE — ED Provider Notes (Signed)
[place], [place]   Name: Arnes Granum DOB: Apr 09, 1956 MRN: [id number] CSN: [id number] PCP: Patient, No Pcp Per  Arrival date and time:  10/22/19 1117  Chief Complaint:  Nasal Congestion and Knee Pain  NOTE: Prior to seeing the patient today, I have reviewed the triage nursing documentation and vital signs. Clinical staff has updated patient's PMH/PSHx, current medication list, and drug allergies/intolerances to ensure comprehensive history available to assist in medical decision making.   History:   HPI: Damyan Cost is a 64 y.o. male who presents today with complaints of nasal congestion and sinus drainage for about 1 month. Patient seen here on 09/30/2019 by Lacinda Axon, DO; notes reviewed. Patient diagnosed with allergic rhinitis. He was started on oral levocetrizine and ipratropium nasal spray, which he notes worked very will for him. He has currently exhausted his supply of the nasal spray and reports that he symptoms have returned. He presents today asking for a refill. He denies any other significant upper respiratory symptoms. He denies fevers/chills. Additionally, patient has pain in his BILATERAL knees secondary to primary OA. Patient wanting to having "gel injections" in his knees however has been advised that "all the cortisone has to be out of his body first". Injections have been delayed until July per his report. He notes that the he was given a ketorolac injection during his last visit and it "really helped him a lot". He is requesting another dose of IM ketorolac today while here in clinic.   Past Medical History:  Diagnosis Date  . Hypertension   . Neck injury     Past Surgical History:  Procedure Laterality Date  . BACK SURGERY    . ELBOW SURGERY    . FOOT SURGERY    . HERNIA REPAIR      Family History  Problem Relation Age of Onset  . Hypertension Mother   . Heart disease Mother   . Colon cancer Father     Social History   Tobacco Use  . Smoking status: Former Smoker   Types: Cigarettes    Quit date: 05/2018    Years since quitting: 1.4  . Smokeless tobacco: Former Network engineer Use Topics  . Alcohol use: Not Currently  . Drug use: Not Currently    Patient Active Problem List   Diagnosis Date Noted  . Hallux valgus, acquired 04/20/2016  . 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 Stress reaction 04/20/2016    Home Medications:    Current Meds  Medication Sig  . amLODipine (NORVASC) 10 MG tablet amlodipine 10 mg tablet  Take 1 tablet every day by oral route.  . hydrochlorothiazide (HYDRODIURIL) 25 MG tablet Take 1 tablet (25 mg total) by mouth daily.  Marland Kitchen levocetirizine (XYZAL) 5 MG tablet Take 1 tablet (5 mg total) by mouth every evening.    Allergies:   Tylenol with codeine #3 [acetaminophen-codeine], Motrin [ibuprofen], and Tramadol  Review of Systems (ROS):  Review of systems NEGATIVE unless otherwise noted in narrative H&P section.   Vital Signs: Today's Vitals   10/22/19 1122 10/22/19 1123 10/22/19 1125 10/22/19 1148  BP:   (!) 159/91   Pulse:   (!) 112   Resp:   16   Temp:   98.3 F (36.8 C)   TempSrc:   Oral   SpO2:   99%   Weight:  207 lb (93.9 kg)    Height:  6\' 5"  (1.956 m)    PainSc: 5    5  Physical Exam: Physical Exam  Constitutional: He is oriented to person, place, and time and well-developed, well-nourished, and in no distress.  HENT:  Head: Normocephalic and atraumatic.  Right Ear: Tympanic membrane normal.  Left Ear: Tympanic membrane normal.  Nose: Mucosal edema and rhinorrhea present. No sinus tenderness.  Mouth/Throat: Uvula is midline, oropharynx is clear and moist and mucous membranes are normal.  Eyes: Pupils are equal, round, and reactive to light.  Cardiovascular: Regular rhythm, normal heart sounds and intact distal pulses. Tachycardia present.  Pulmonary/Chest: Effort normal and breath sounds normal.  Musculoskeletal:     Comments: BILATERAL knees with normal AROM. No evidence of effusion. (+)  crepitus. No erythema, significant swelling, or warmth. Pain overlying mainly the medial joint lines.   Neurological: He is alert and oriented to person, place, and time. He has normal sensation, normal strength and normal reflexes. Gait normal.  Skin: Skin is warm and dry. No rash noted. He is not diaphoretic.  Psychiatric: Mood, memory, affect and judgment normal.  Nursing note and vitals reviewed.   Urgent Care Treatments / Results:   No orders of the defined types were placed in this encounter.   LABS: PLEASE NOTE: all labs that were ordered this encounter are listed, however only abnormal results are displayed. Labs Reviewed - No data to display  EKG: -None  RADIOLOGY: No results found.  PROCEDURES: Procedures  MEDICATIONS RECEIVED THIS VISIT: Medications  ketorolac (TORADOL) 30 MG/ML injection 30 mg (30 mg Intramuscular Given 10/22/19 1147)    PERTINENT CLINICAL COURSE NOTES/UPDATES:   Initial Impression / Assessment and Plan / Urgent Care Course:  Pertinent labs & imaging results that were available during my care of the patient were personally reviewed by me and considered in my medical decision making (see lab/imaging section of note for values and interpretations).  Mitcheal Rabbitt is a 64 y.o. male who presents to [facility] today with complaints of Nasal Congestion and Knee Pain  Patient is well appearing overall in clinic today. He does not appear to be in any acute distress. Presenting symptoms (see HPI) and exam as documented above. Exam consistent with allergic rhinitis. Patient to continue prescribed levocetirizine. Will refill the ipratropium nasal spray for PRN use. Patient to ensure that he is staying well hydrated. For his knees, patient ultimately needs to see orthopedics for the proposed injections, however this cannot happen until July per his report. Requesting IM ketorolac today in clinic, which is reasonable. Ketorolac 30 mg IM given. Patient to  continue management of his pain at home using his prescribed COT (Percocet 10/325 mg). Will need to follow up with orthopedics and/or PCP as already scheduled.   I have reviewed the follow up and strict return precautions for any new or worsening symptoms. Patient is aware of symptoms that would be deemed urgent/emergent, and would thus require further evaluation either here or in the emergency department. At the time of discharge, he verbalized understanding and consent with the discharge plan as it was reviewed with him. All questions were fielded by provider and/or clinic staff prior to patient discharge.    Final Clinical Impressions / Urgent Care Diagnoses:   Final diagnoses:  Allergic rhinitis, unspecified seasonality, unspecified trigger  Primary osteoarthritis of both knees  Encounter for medication refill    New Prescriptions:  [place] Controlled Substance Registry consulted? Yes, I have consulted the [place] Controlled Substances Registry for this patient, and feel the risk/benefit ratio today not favorable for proceeding with this prescription for  a controlled substance. Recent fills as follows:    Meds ordered this encounter  Medications  . ketorolac (TORADOL) 30 MG/ML injection 30 mg  . ipratropium (ATROVENT) 0.06 % nasal spray    Sig: Place 2 sprays into both nostrils 4 (four) times daily as needed for rhinitis.    Dispense:  15 mL    Refill:  1    Recommended Follow up Care:  Patient encouraged to follow up with the following provider within the specified time frame, or sooner as dictated by the severity of his symptoms. As always, he was instructed that for any urgent/emergent care needs, he should seek care either here or in the emergency department for more immediate evaluation.  Follow-up Information    PCP In 1 week.   Why: General reassessment of symptoms if not improving        NOTE: This note was prepared using Lobbyist along with smaller Tree surgeon. Despite my best ability to proofread, there is the potential that transcriptional errors may still occur from this process, and are completely unintentional.    Karen Kitchens, NP 10/22/19 1232

## 2019-10-22 NOTE — ED Triage Notes (Signed)
Patient c/o ongoing sinus drainage and congestion for a month.  Patient states that he ran out of his nasal spray.

## 2019-10-22 NOTE — Discharge Instructions (Signed)
It was very nice seeing you today in clinic. Thank you for entrusting me with your care.   Continue allergy medication as prescribed. Use nasal spray as needed. Follow up with orthopedics as already scheduled for your knee injections.   Make arrangements to follow up with your regular doctor in 1 week for re-evaluation if not improving. If your symptoms/condition worsens, please seek follow up care either here or in the ER. Please remember, our [place] providers are "right here with you" when you need [place].   Again, it was my pleasure to take care of you today. Thank you for choosing our clinic. I hope that you start to feel better quickly.   Honor Loh, MSN, APRN, FNP-C, CEN Advanced Practice Provider [facility]

## 2019-10-25 ENCOUNTER — Other Ambulatory Visit: Payer: Self-pay

## 2019-10-25 ENCOUNTER — Inpatient Hospital Stay
Admission: EM | Admit: 2019-10-25 | Discharge: 2019-10-28 | DRG: 446 | Disposition: A | Discharge status: Home / Self Care | Payer: Medicare Other | Attending: Internal Medicine | Admitting: Internal Medicine

## 2019-10-25 ENCOUNTER — Emergency Department: Payer: Medicare Other

## 2019-10-25 ENCOUNTER — Encounter: Payer: Self-pay | Admitting: Emergency Medicine

## 2019-10-25 DIAGNOSIS — Z885 Allergy status to narcotic agent status: Secondary | ICD-10-CM | POA: Diagnosis not present

## 2019-10-25 DIAGNOSIS — Z8249 Family history of ischemic heart disease and other diseases of the circulatory system: Secondary | ICD-10-CM

## 2019-10-25 DIAGNOSIS — Z800 Family history of malignant neoplasm of digestive organs: Secondary | ICD-10-CM

## 2019-10-25 DIAGNOSIS — R932 Abnormal findings on diagnostic imaging of liver and biliary tract: Secondary | ICD-10-CM | POA: Diagnosis not present

## 2019-10-25 DIAGNOSIS — Z9049 Acquired absence of other specified parts of digestive tract: Secondary | ICD-10-CM | POA: Diagnosis not present

## 2019-10-25 DIAGNOSIS — K8050 Calculus of bile duct without cholangitis or cholecystitis without obstruction: Secondary | ICD-10-CM | POA: Diagnosis present

## 2019-10-25 DIAGNOSIS — Z20822 Contact with and (suspected) exposure to covid-19: Secondary | ICD-10-CM | POA: Diagnosis present

## 2019-10-25 DIAGNOSIS — Z886 Allergy status to analgesic agent status: Secondary | ICD-10-CM | POA: Diagnosis not present

## 2019-10-25 DIAGNOSIS — M170 Bilateral primary osteoarthritis of knee: Secondary | ICD-10-CM | POA: Diagnosis present

## 2019-10-25 DIAGNOSIS — I10 Essential (primary) hypertension: Secondary | ICD-10-CM | POA: Diagnosis present

## 2019-10-25 DIAGNOSIS — Z87891 Personal history of nicotine dependence: Secondary | ICD-10-CM

## 2019-10-25 DIAGNOSIS — K8020 Calculus of gallbladder without cholecystitis without obstruction: Secondary | ICD-10-CM

## 2019-10-25 DIAGNOSIS — R0981 Nasal congestion: Secondary | ICD-10-CM | POA: Diagnosis not present

## 2019-10-25 LAB — COMPREHENSIVE METABOLIC PANEL
ALT: 21 U/L (ref 0–44)
AST: 21 U/L (ref 15–41)
Albumin: 4.4 g/dL (ref 3.5–5.0)
Alkaline Phosphatase: 72 U/L (ref 38–126)
Anion gap: 9 (ref 5–15)
BUN: 21 mg/dL (ref 8–23)
CO2: 28 mmol/L (ref 22–32)
Calcium: 9.8 mg/dL (ref 8.9–10.3)
Chloride: 101 mmol/L (ref 98–111)
Creatinine, Ser: 1.23 mg/dL (ref 0.61–1.24)
GFR calc Af Amer: >60 mL/min (ref >60)
GFR calc non Af Amer: >60 mL/min (ref >60)
Glucose, Bld: 104 mg/dL — ABNORMAL HIGH (ref 70–99)
Potassium: 4 mmol/L (ref 3.5–5.1)
Sodium: 138 mmol/L (ref 135–145)
Total Bilirubin: 0.7 mg/dL (ref 0.3–1.2)
Total Protein: 7.9 g/dL (ref 6.5–8.1)

## 2019-10-25 LAB — URINALYSIS, COMPLETE (UACMP) WITH MICROSCOPIC
Bacteria, UA: NONE SEEN
Bilirubin Urine: NEGATIVE
Glucose, UA: NEGATIVE mg/dL
Hgb urine dipstick: NEGATIVE
Ketones, ur: NEGATIVE mg/dL
Leukocytes,Ua: NEGATIVE
Nitrite: NEGATIVE
Protein, ur: NEGATIVE mg/dL
Specific Gravity, Urine: 1.017 (ref 1.005–1.030)
Squamous Epithelial / HPF: NONE SEEN (ref 0–5)
pH: 5 (ref 5.0–8.0)

## 2019-10-25 LAB — SARS CORONAVIRUS 2 BY RT PCR (HOSPITAL ORDER, PERFORMED IN ~~LOC~~ HOSPITAL LAB): SARS Coronavirus 2: NEGATIVE

## 2019-10-25 LAB — CBC
HCT: 43.2 % (ref 39.0–52.0)
Hemoglobin: 15 g/dL (ref 13.0–17.0)
MCH: 31.6 pg (ref 26.0–34.0)
MCHC: 34.7 g/dL (ref 30.0–36.0)
MCV: 91.1 fL (ref 80.0–100.0)
Platelets: 295 10*3/uL (ref 150–400)
RBC: 4.74 MIL/uL (ref 4.22–5.81)
RDW: 15.1 % (ref 11.5–15.5)
WBC: 12 10*3/uL — ABNORMAL HIGH (ref 4.0–10.5)
nRBC: 0 % (ref 0.0–0.2)

## 2019-10-25 LAB — LIPASE, BLOOD: Lipase: 39 U/L (ref 11–51)

## 2019-10-25 IMAGING — MR MR 3D RECON AT SCANNER
1 series · 16 of 16 positions shown · non-contrast
Comparison: No prior abdominal MRI. CT the abdomen and pelvis
[DATE].

CLINICAL DATA: 64-year-old male with history of dilated common bile
duct. Evaluated for potential choledocholithiasis.

EXAM:
MRI ABDOMEN WITHOUT CONTRAST  (INCLUDING MRCP)
TECHNIQUE: Multiplanar multisequence MR imaging of the abdomen was performed.
Heavily T2-weighted images of the biliary and pancreatic ducts were
obtained, and three-dimensional MRCP images were rendered by post
processing.

[Series 11: T1 · axial · 6.0mm · 0.74mm/px · z∈[+25,+270]mm · 16 of 35 slices shown]
[im 1/35]
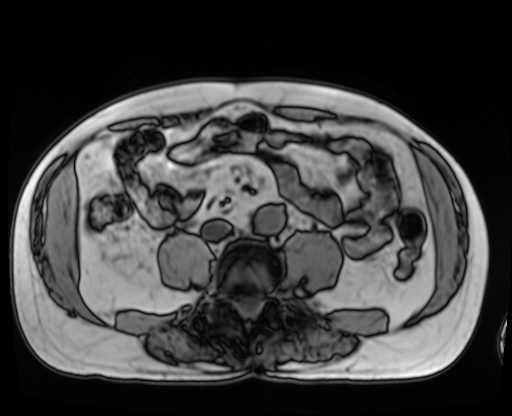
[im 3/35]
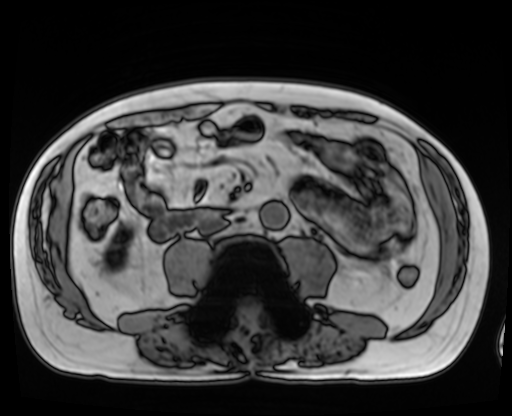
[im 5/35]
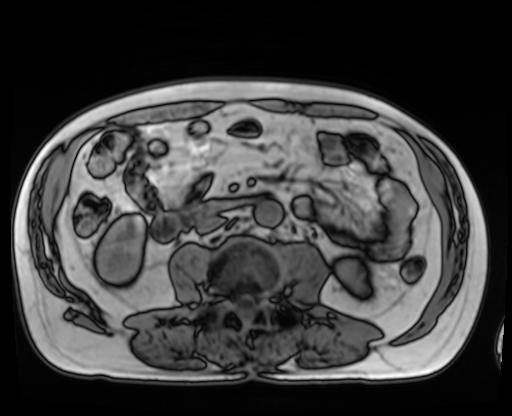
[im 7/35]
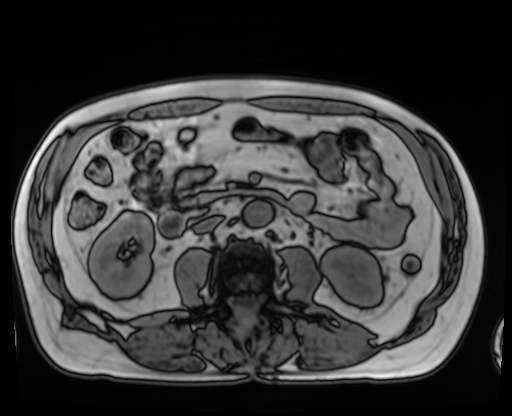
[im 10/35]
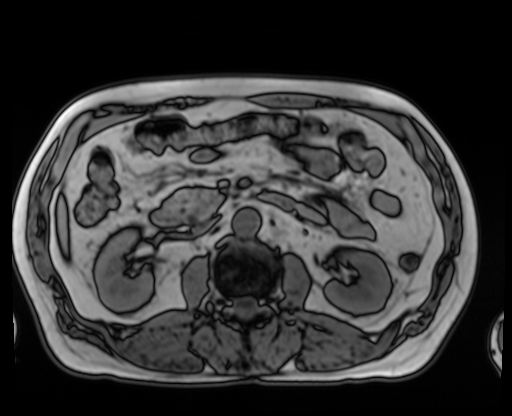
[im 12/35]
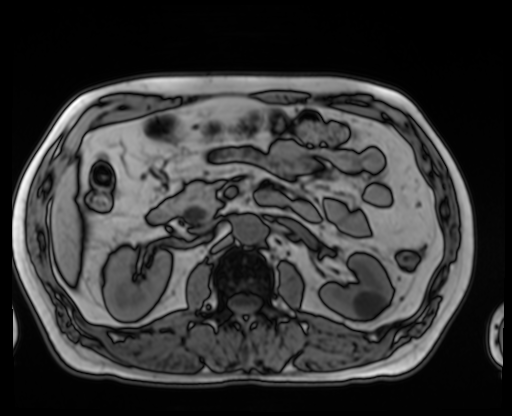
[im 14/35]
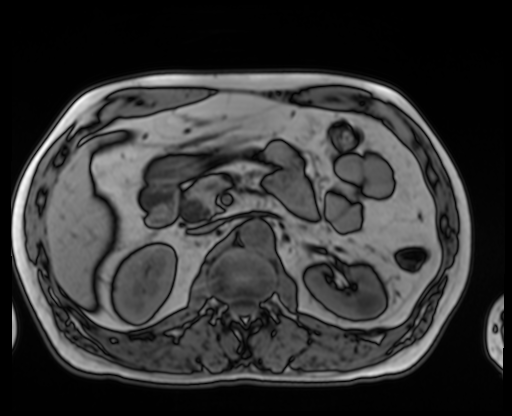
[im 16/35]
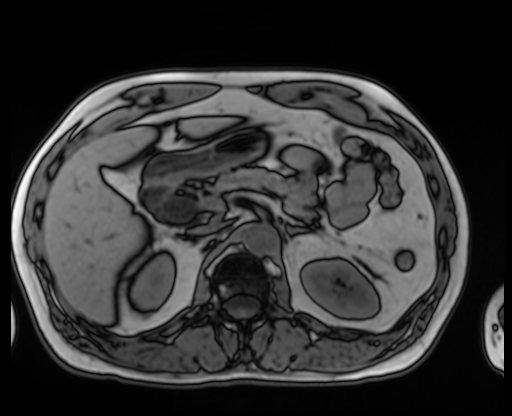
[im 19/35]
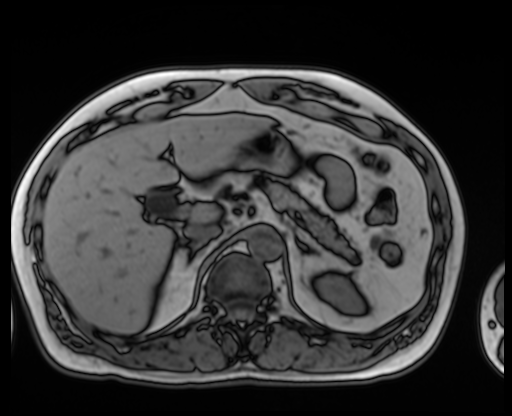
[im 21/35]
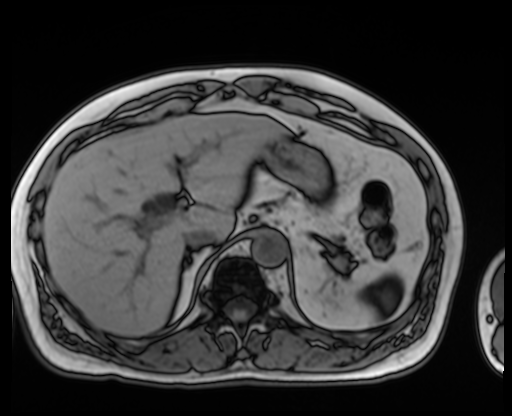
[im 23/35]
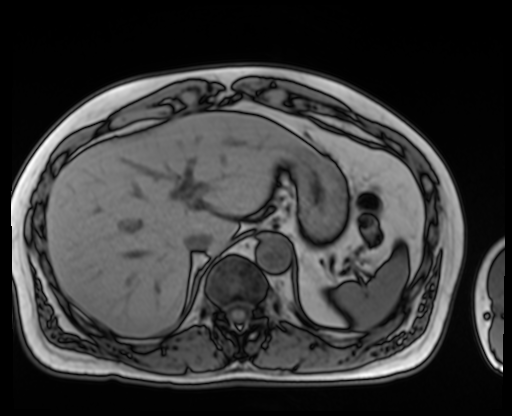
[im 25/35]
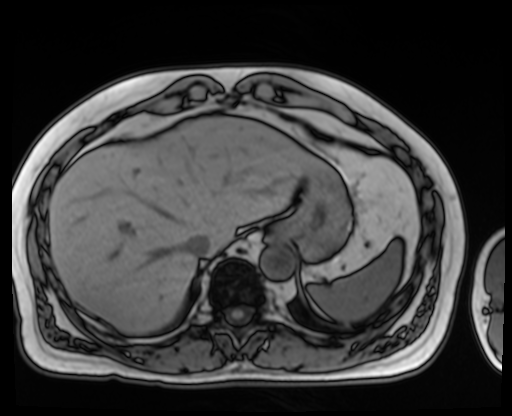
[im 28/35]
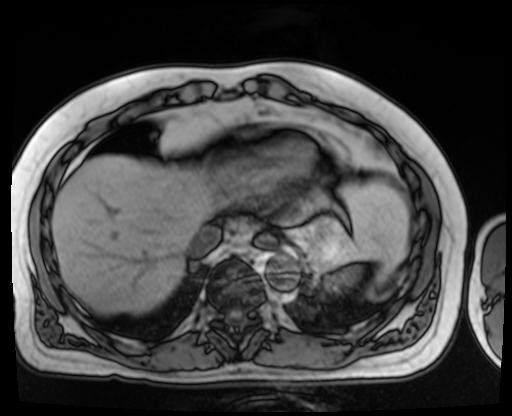
[im 30/35]
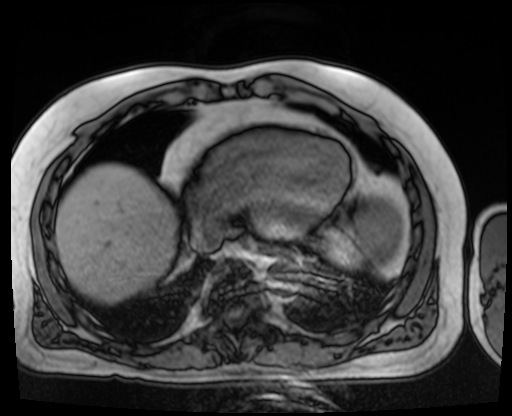
[im 32/35]
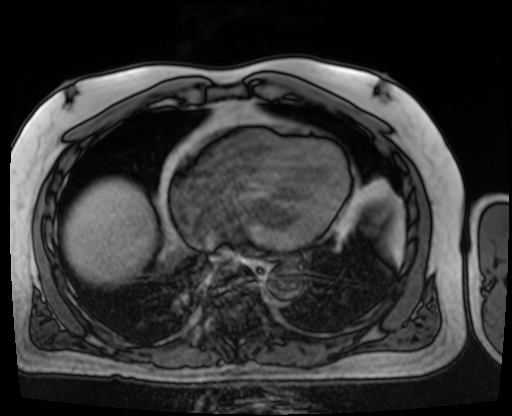
[im 35/35]
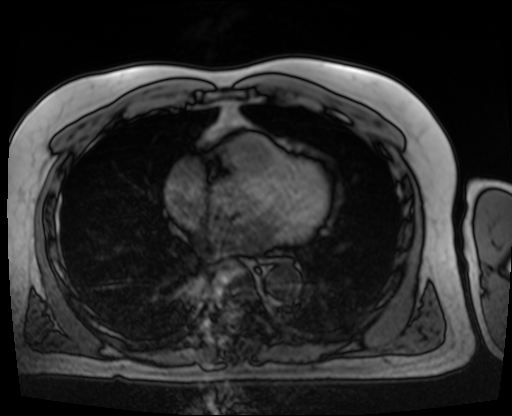

[16 of 16 positions shown; findings below may reference images not displayed]

FINDINGS: Comment: Study is limited by lack of IV gadolinium for detection and
characterization of visceral and/or vascular lesions.

Lower chest: Unremarkable.

Hepatobiliary: No suspicious cystic or solid hepatic lesions are
confidently identified on today's noncontrast examination. There is
mild intrahepatic biliary ductal dilatation and moderate to severe
extrahepatic biliary ductal dilatation with the common bile duct
noted to measure up to 21 mm in the porta hepatis on MRCP images.
Status post cholecystectomy. In the distal common bile duct
immediately before the ampulla there is a small filling defect
measuring 3 mm (coronal image 9 of series 15), compatible with a
retained ductal stone.

Pancreas: No definite pancreatic mass or peripancreatic fluid
collections or inflammatory changes noted on today's noncontrast
examination. No pancreatic ductal dilatation noted on MRCP images.

Spleen:  Unremarkable.

Adrenals/Urinary Tract: In the anterior aspect of the interpolar
region of the right kidney there is a 6 mm T1 hyperintense, T2
hypointense lesion which is incompletely characterized on today's
noncontrast examination, but statistically likely to represent a
proteinaceous/hemorrhagic cyst. Three T1 hypointense, T2
hyperintense lesions are noted in the left kidney, also not
characterized on today's noncontrast examination, but statistically
likely to represent cysts, largest of which is in the interpolar
region measuring 2.4 cm in diameter.

Stomach/Bowel: Visualized portions are unremarkable.

Vascular/Lymphatic: No aneurysm identified in the visualized
abdominal vasculature. No lymphadenopathy noted in the abdomen.

Other: No significant volume of ascites noted in the visualized
portions of the peritoneal cavity.

Musculoskeletal: No aggressive appearing osseous lesions are noted
in the visualized portions of the skeleton. Orthopedic fixation
hardware in the lumbar spine incompletely imaged.
IMPRESSION: 1. 3 mm filling defect in the distal common bile duct immediately
before the ampulla, compatible with retained choledocholithiasis.
This is associated with moderate to severe extrahepatic biliary
ductal dilatation and mild intrahepatic biliary ductal dilatation.
Although some of this dilatation could be related to chronic post
cholecystectomy physiology, if there is clinical evidence of biliary
tract obstruction, the choledocholithiasis is likely a source of
obstruction.
2. Bilateral renal lesions, incompletely characterized on today's
noncontrast examination but favored to represent Bosniak class 1 and
Bosniak class 2 cysts, as detailed above. These could be
definitively characterized with follow-up nonemergent abdominal MRI
with and without IV gadolinium if of clinical concern.

## 2019-10-25 IMAGING — MR MR MRCP
9 of 10 series · 41 of 48 positions shown · non-contrast
Comparison: No prior abdominal MRI. CT the abdomen and pelvis
[DATE].

CLINICAL DATA: 64-year-old male with history of dilated common bile
duct. Evaluated for potential choledocholithiasis.

EXAM:
MRI ABDOMEN WITHOUT CONTRAST  (INCLUDING MRCP)
TECHNIQUE: Multiplanar multisequence MR imaging of the abdomen was performed.
Heavily T2-weighted images of the biliary and pancreatic ducts were
obtained, and three-dimensional MRCP images were rendered by post
processing.

[Series 6: T2 · coronal · 6.0mm · 1.19mm/px · 2 of 30 slices shown (1 of 2)]
[im 1/30]
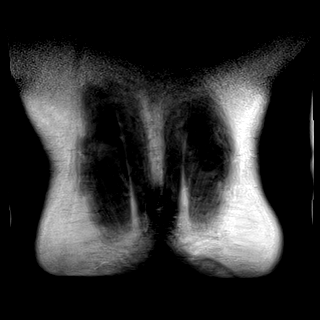
[im 30/30]
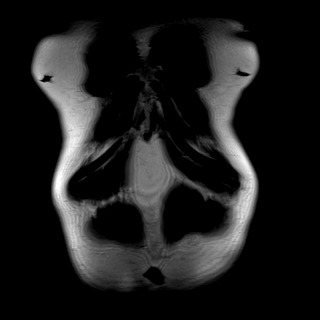

[Series 7: T2 · axial · 6.0mm · 1.19mm/px · z∈[+25,+270]mm · 4 of 35 slices shown (2 of 2)]
[im 1/35]
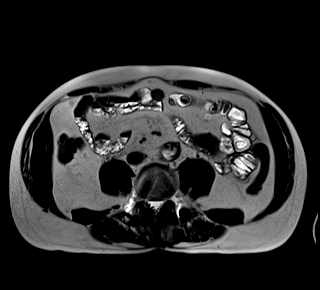
[im 12/35]
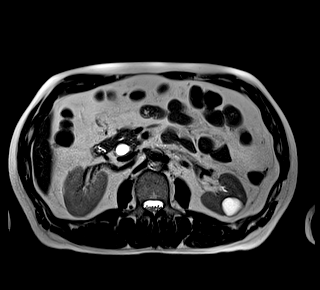
[im 23/35]
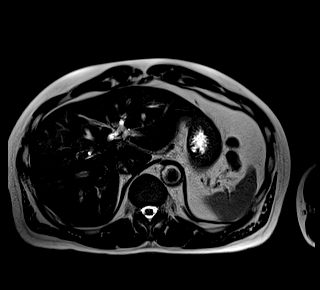
[im 35/35]
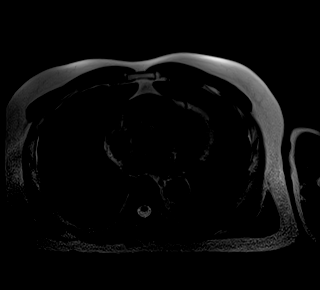

[Series 10: T2 fat-sat · axial · 6.0mm · 1.19mm/px · z∈[+25,+270]mm · 4 of 35 slices shown]
[im 1/35]
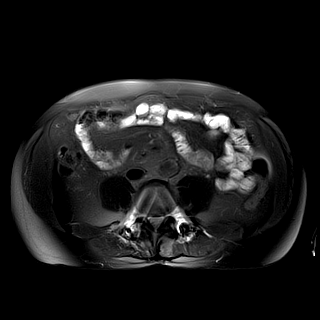
[im 12/35]
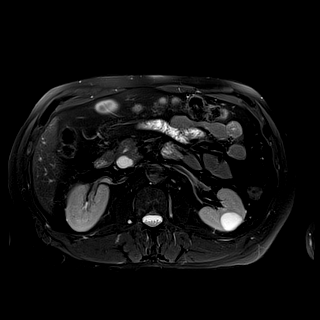
[im 23/35]
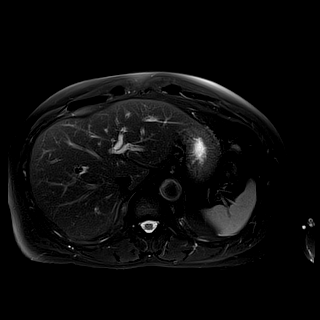
[im 35/35]
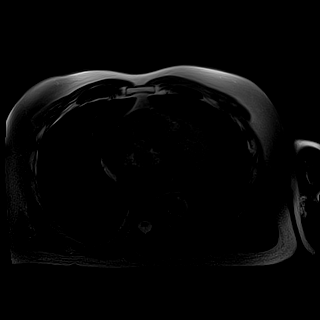

[Series 15: MRCP · coronal · 3.0mm · 1.12mm/px · 2 of 17 slices shown]
[im 1/17]
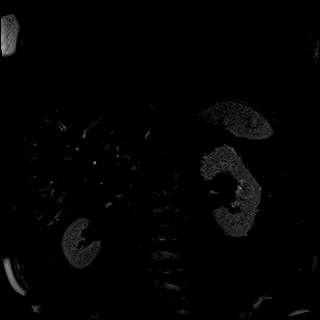
[im 17/17]
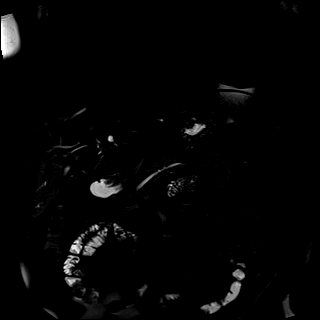

[Series 16: radials · coronal · 50.0mm · 0.78mm/px · 1 of 5 slices shown]
[im 1/5]
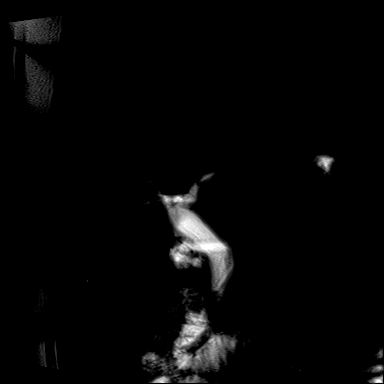

[Series 17: ax dwi_tracew · axial · 6.0mm · 1.42mm/px · z∈[+21,+273]mm · 11 of 108 slices shown]
[im 1/108]
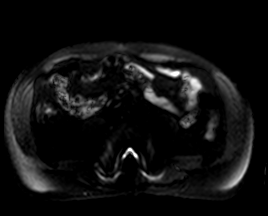
[im 11/108]
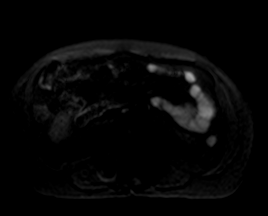
[im 22/108]
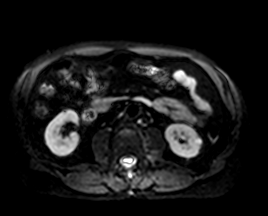
[im 33/108]
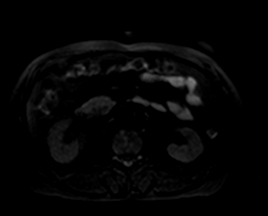
[im 43/108]
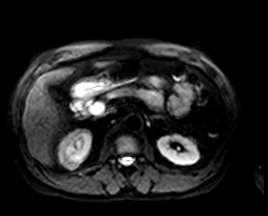
[im 54/108]
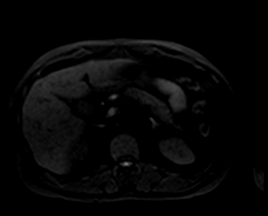
[im 65/108]
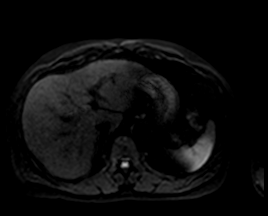
[im 75/108]
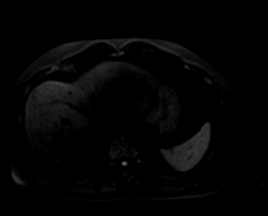
[im 86/108]
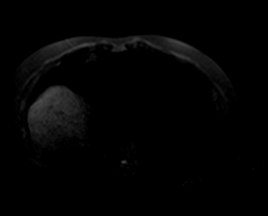
[im 97/108]
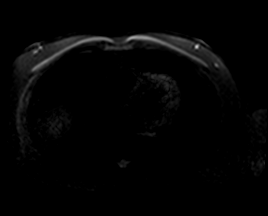
[im 108/108]
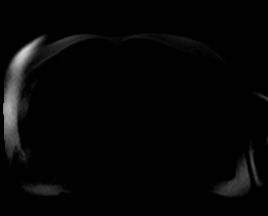

[Series 18: ax dwi_adc · axial · 6.0mm · 1.42mm/px · z∈[+21,+273]mm · 4 of 36 slices shown]
[im 1/36]
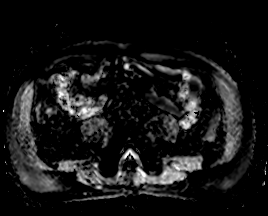
[im 12/36]
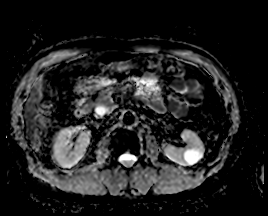
[im 24/36]
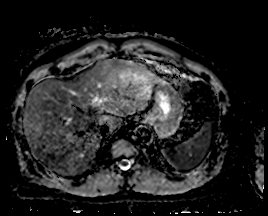
[im 36/36]
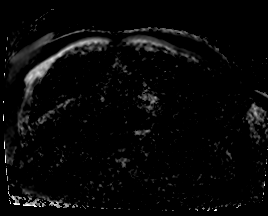

[Series 19: T1 dynamic fat-sat · axial · non-contrast · 3.0mm · 1.19mm/px · z∈[+4,+265]mm · 9 of 88 slices shown]
[im 1/88]
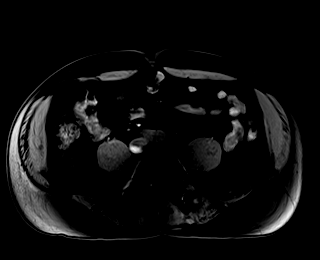
[im 11/88]
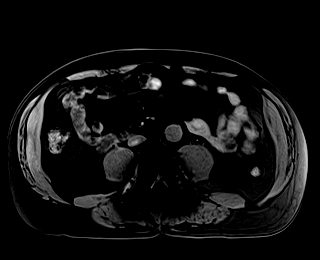
[im 22/88]
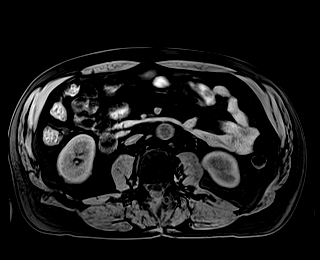
[im 33/88]
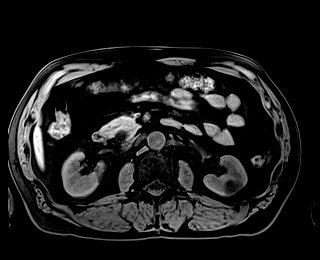
[im 44/88]
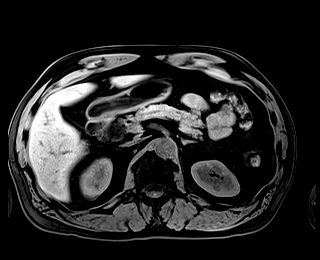
[im 55/88]
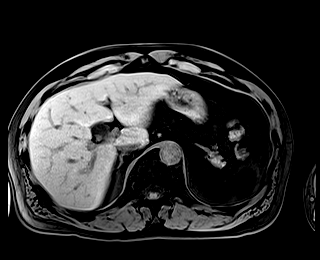
[im 66/88]
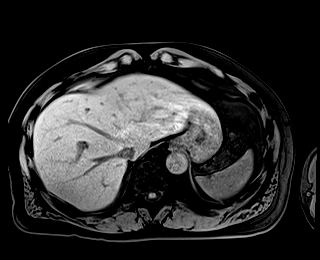
[im 77/88]
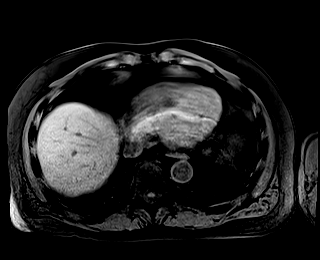
[im 88/88]
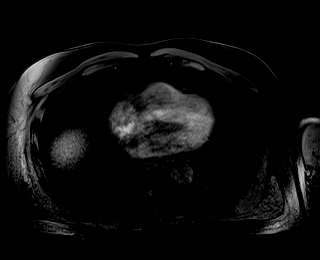

[Series 1027: T1 · axial · 6.0mm · 0.74mm/px · z∈[+25,+270]mm · 4 of 35 slices shown]
[im 1/35]
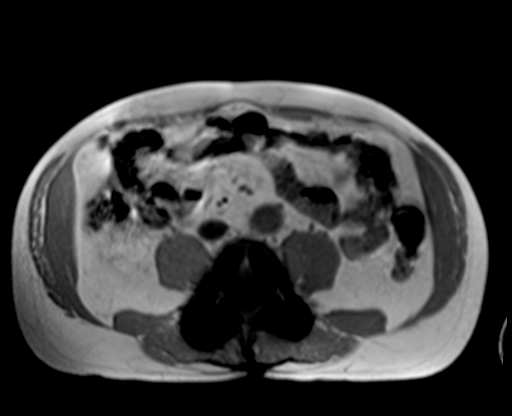
[im 12/35]
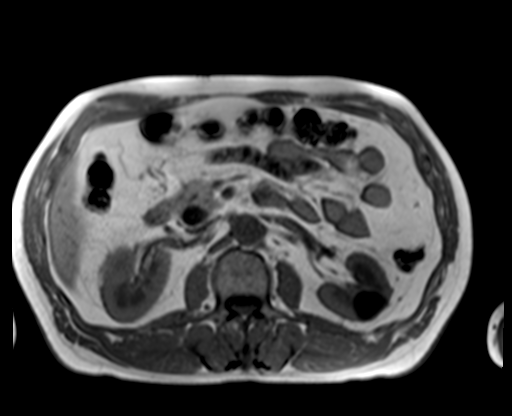
[im 23/35]
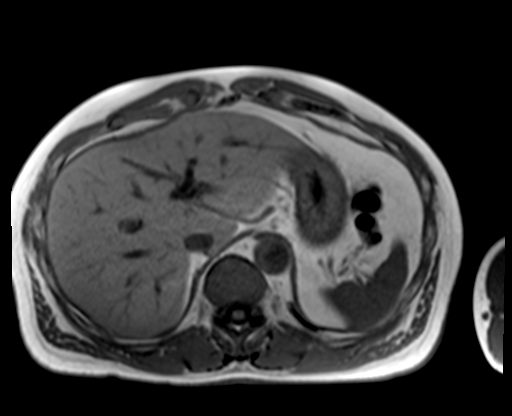
[im 35/35]
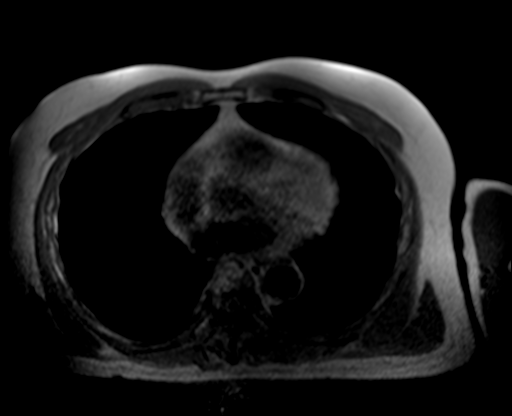

[41 of 48 positions shown; findings below may reference images not displayed]

FINDINGS: Comment: Study is limited by lack of IV gadolinium for detection and
characterization of visceral and/or vascular lesions.

Lower chest: Unremarkable.

Hepatobiliary: No suspicious cystic or solid hepatic lesions are
confidently identified on today's noncontrast examination. There is
mild intrahepatic biliary ductal dilatation and moderate to severe
extrahepatic biliary ductal dilatation with the common bile duct
noted to measure up to 21 mm in the porta hepatis on MRCP images.
Status post cholecystectomy. In the distal common bile duct
immediately before the ampulla there is a small filling defect
measuring 3 mm (coronal image 9 of series 15), compatible with a
retained ductal stone.

Pancreas: No definite pancreatic mass or peripancreatic fluid
collections or inflammatory changes noted on today's noncontrast
examination. No pancreatic ductal dilatation noted on MRCP images.

Spleen:  Unremarkable.

Adrenals/Urinary Tract: In the anterior aspect of the interpolar
region of the right kidney there is a 6 mm T1 hyperintense, T2
hypointense lesion which is incompletely characterized on today's
noncontrast examination, but statistically likely to represent a
proteinaceous/hemorrhagic cyst. Three T1 hypointense, T2
hyperintense lesions are noted in the left kidney, also not
characterized on today's noncontrast examination, but statistically
likely to represent cysts, largest of which is in the interpolar
region measuring 2.4 cm in diameter.

Stomach/Bowel: Visualized portions are unremarkable.

Vascular/Lymphatic: No aneurysm identified in the visualized
abdominal vasculature. No lymphadenopathy noted in the abdomen.

Other: No significant volume of ascites noted in the visualized
portions of the peritoneal cavity.

Musculoskeletal: No aggressive appearing osseous lesions are noted
in the visualized portions of the skeleton. Orthopedic fixation
hardware in the lumbar spine incompletely imaged.
IMPRESSION: 1. 3 mm filling defect in the distal common bile duct immediately
before the ampulla, compatible with retained choledocholithiasis.
This is associated with moderate to severe extrahepatic biliary
ductal dilatation and mild intrahepatic biliary ductal dilatation.
Although some of this dilatation could be related to chronic post
cholecystectomy physiology, if there is clinical evidence of biliary
tract obstruction, the choledocholithiasis is likely a source of
obstruction.
2. Bilateral renal lesions, incompletely characterized on today's
noncontrast examination but favored to represent Bosniak class 1 and
Bosniak class 2 cysts, as detailed above. These could be
definitively characterized with follow-up nonemergent abdominal MRI
with and without IV gadolinium if of clinical concern.

## 2019-10-25 IMAGING — CT CT ABD-PELV W/ CM
2 of 6 series · 14 of 46 positions shown, 16 images · IV contrast (APPLIED)
Comparison: None.

CLINICAL DATA: Umbilical abdominal pain for 1 day, awoke patient
this morning at [68] hours, history of pancreatitis, suspected
intra-abdominal infection/abscess, former smoker, prior
cholecystectomy and hernia repairs, hypertension, back surgery

EXAM:
CT ABDOMEN AND PELVIS WITH CONTRAST
TECHNIQUE: Multidetector CT imaging of the abdomen and pelvis was performed
using the standard protocol following bolus administration of
intravenous contrast. Sagittal and coronal MPR images reconstructed
from axial data set.
CONTRAST:  100mL OMNIPAQUE IOHEXOL 300 MG/ML SOLN IV. No oral
contrast.

[Series 2: routine abd/pel with · axial · 0.85mm/px · z∈[-1139,-744]mm · 11 of 95 slices shown, 13 images]
[im 8/95  soft-tissue]
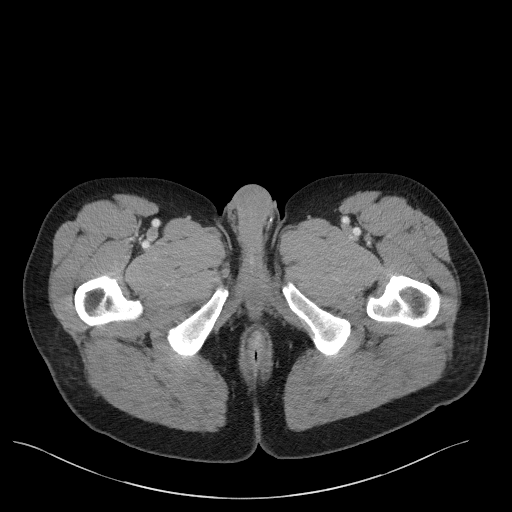
[im 8/95  bone]
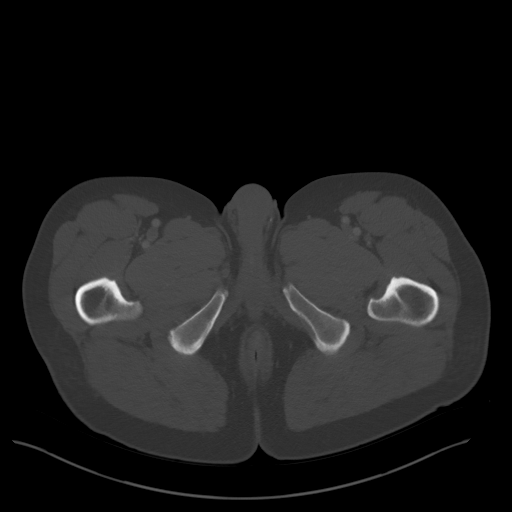
[im 16/95  soft-tissue]
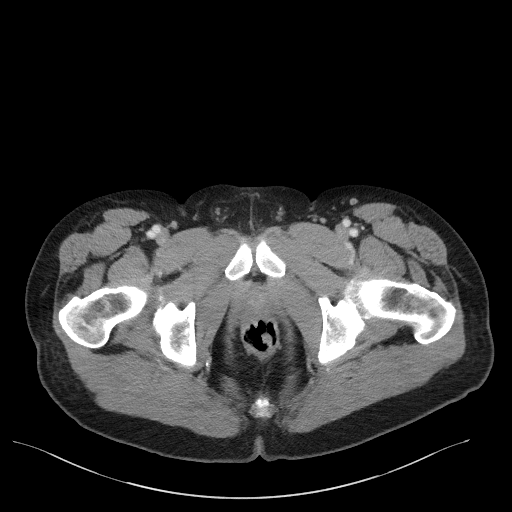
[im 24/95  soft-tissue]
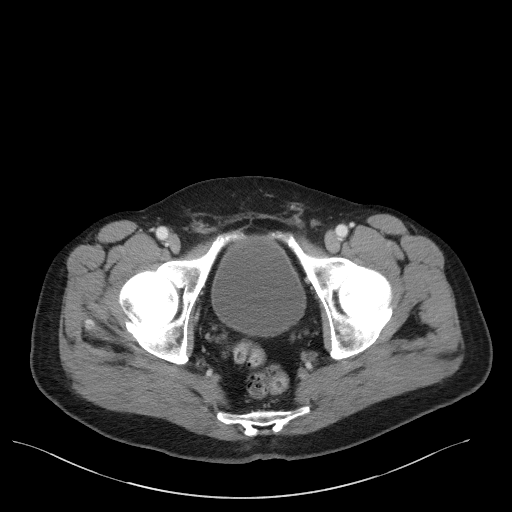
[im 32/95  soft-tissue]
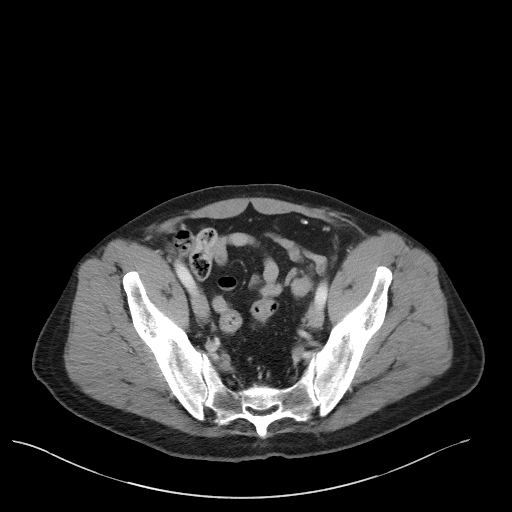
[im 40/95  soft-tissue]
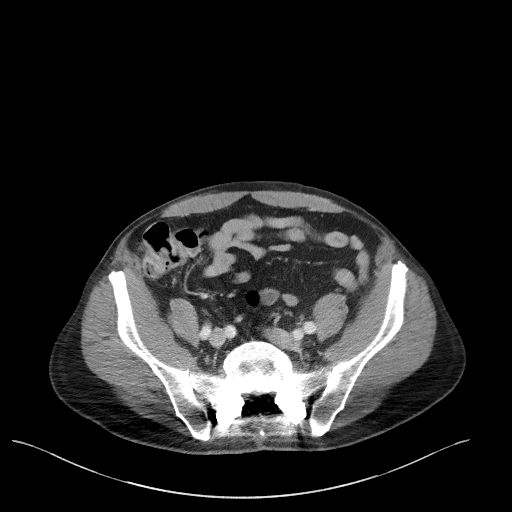
[im 48/95  soft-tissue]
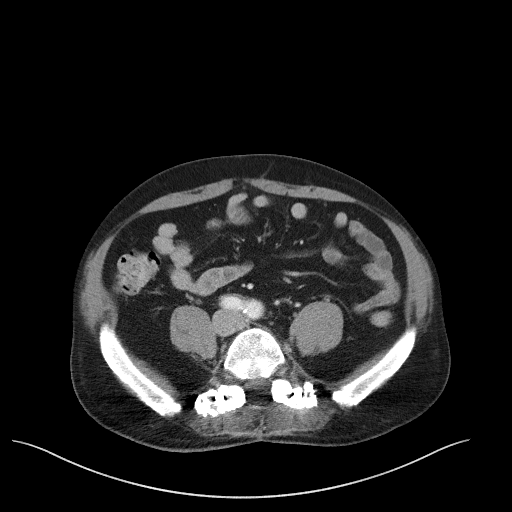
[im 55/95  soft-tissue]
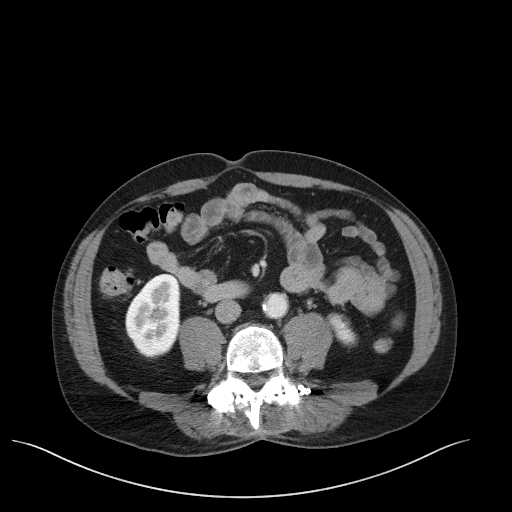
[im 63/95  soft-tissue]
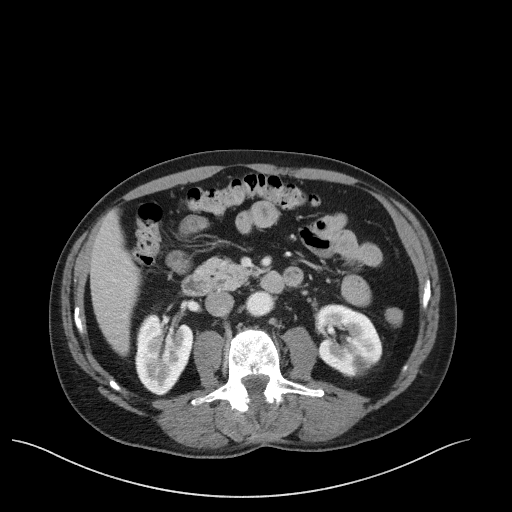
[im 71/95  soft-tissue]
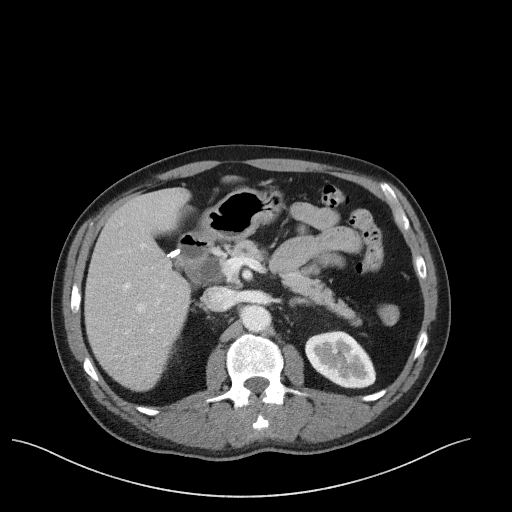
[im 71/95  bone]
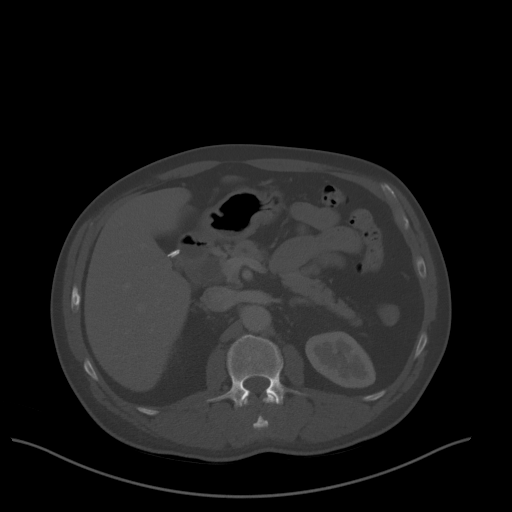
[im 79/95  soft-tissue]
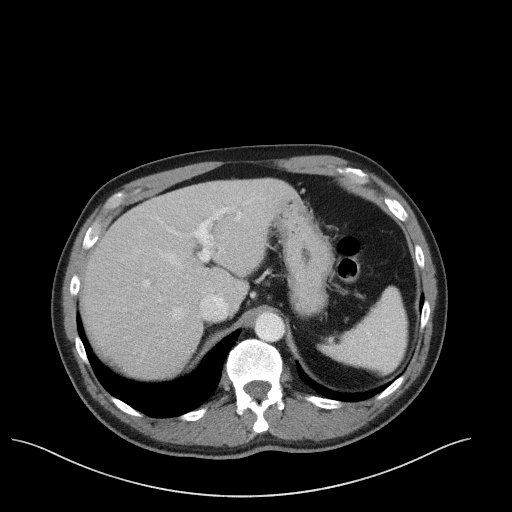
[im 87/95  soft-tissue]
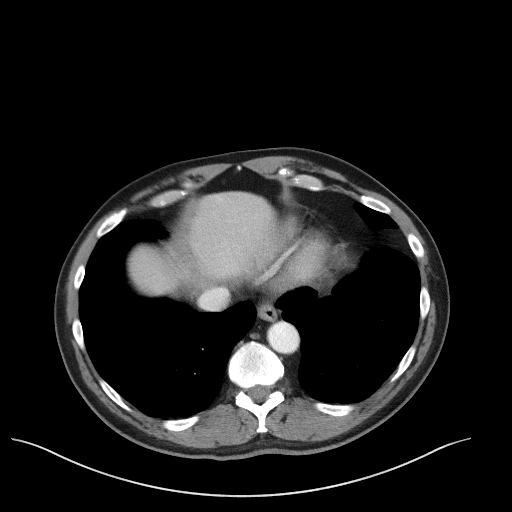

[Series 5: coronal st · coronal · 0.79mm/px · 3 of 92 slices shown]
[im 31/92  soft-tissue]
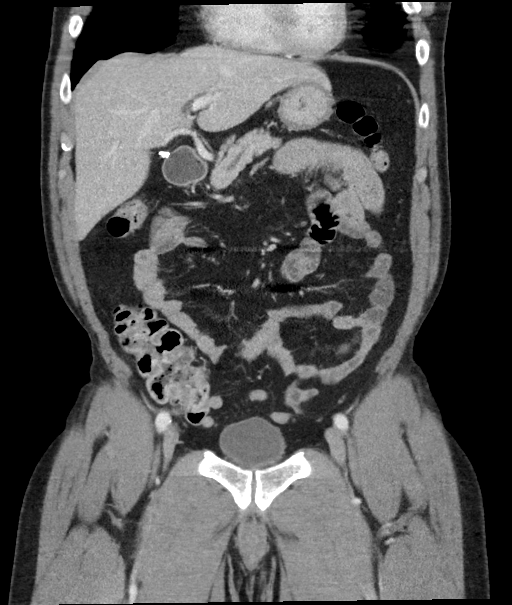
[im 41/92  soft-tissue]
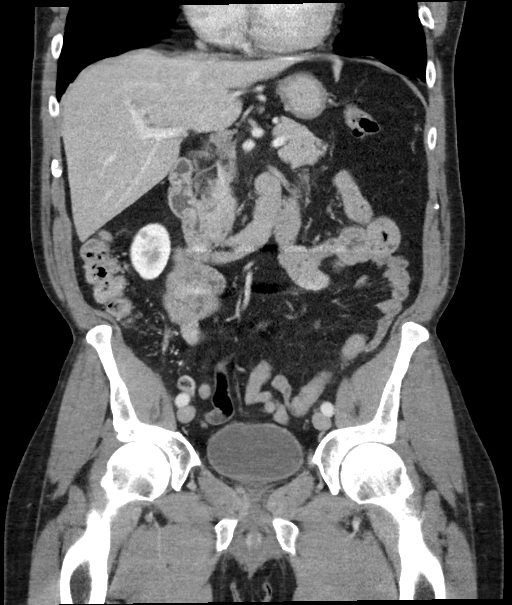
[im 51/92  soft-tissue]
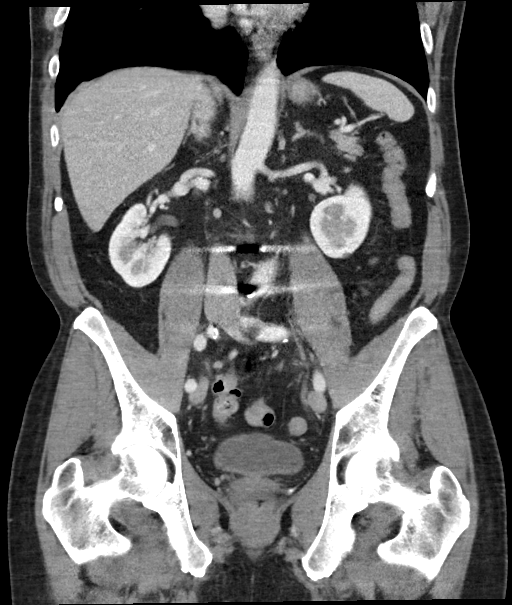

[14 of 46 positions shown; findings below may reference images not displayed]

FINDINGS: Lower chest: Lung bases clear

Hepatobiliary: Gallbladder surgically absent. Tiny nonspecific
low-attenuation focus liver image 28. Liver otherwise unremarkable.

Pancreas: Dilated CBD at pancreatic head up to 17 mm diameter with
distal tapering. No definite mass identified. Remainder of pancreas
normal appearance without surrounding inflammatory changes.

Spleen: Normal appearance

Adrenals/Urinary Tract: Adrenal glands normal appearance. LEFT renal
cysts. Indeterminate intermediate attenuation RIGHT renal nodule 9
mm diameter. No urinary tract calcification or dilatation. Ureters
and bladder unremarkable.

Stomach/Bowel: Normal appendix. Colon normal appearance without
diverticulosis or diverticulitis. Stomach and small bowel loops
normal appearance.

Vascular/Lymphatic: Scattered pelvic phleboliths. Atherosclerotic
calcifications aorta and iliac arteries without aneurysm. Vascular
structures patent. No adenopathy.

Reproductive: Unremarkable prostate gland and seminal vesicles

Other: No free air or free fluid. No definite inflammatory process
or hernia.

Musculoskeletal: Prior lumbar fusion L3-S1. No acute osseous
findings.
IMPRESSION: Dilated CBD up to 17 mm diameter with distal tapering but no
definite mass or calculus identified; this is in excess of typical
physiologic dilatation seen post cholecystectomy, recommend
correlation with LFTs.

LEFT renal cysts.

Indeterminate 9 mm RIGHT renal nodule; consider follow-up ultrasound
characterization.

No other intra-abdominal or intrapelvic abnormalities.

Aortic Atherosclerosis ([68]-[68]).

## 2019-10-25 MED ORDER — ONDANSETRON HCL 4 MG/2ML IJ SOLN
4.0000 mg | Freq: Once | INTRAMUSCULAR | Status: AC
  Administered 2019-10-25: 4 mg via INTRAVENOUS
  Filled 2019-10-25: qty 2

## 2019-10-25 MED ORDER — HYDRALAZINE HCL 20 MG/ML IJ SOLN: 10.0000 mg | Freq: Four times a day (QID) | INTRAMUSCULAR | Status: DC | PRN

## 2019-10-25 MED ORDER — ONDANSETRON HCL 4 MG/2ML IJ SOLN: 4.0000 mg | Freq: Four times a day (QID) | INTRAMUSCULAR | Status: DC | PRN

## 2019-10-25 MED ORDER — PIPERACILLIN-TAZOBACTAM 3.375 G IVPB 30 MIN: 3.3750 g | Freq: Once | INTRAVENOUS | Status: DC

## 2019-10-25 MED ORDER — DOCUSATE SODIUM 100 MG PO CAPS
100.0000 mg | ORAL_CAPSULE | Freq: Two times a day (BID) | ORAL | Status: DC
  Administered 2019-10-26 – 2019-10-28 (×6): 100 mg via ORAL
  Filled 2019-10-25 (×6): qty 1

## 2019-10-25 MED ORDER — SODIUM CHLORIDE 0.9 % IV SOLN: Freq: Once | INTRAVENOUS | Status: AC

## 2019-10-25 MED ORDER — IOHEXOL 300 MG/ML  SOLN
100.0000 mL | Freq: Once | INTRAMUSCULAR | Status: AC | PRN
  Administered 2019-10-25: 100 mL via INTRAVENOUS
  Filled 2019-10-25: qty 100

## 2019-10-25 MED ORDER — DEXTROSE-NACL 5-0.45 % IV SOLN: INTRAVENOUS | Status: DC

## 2019-10-25 MED ORDER — MORPHINE SULFATE (PF) 4 MG/ML IV SOLN
4.0000 mg | INTRAVENOUS | Status: DC | PRN
  Administered 2019-10-25 (×2): 4 mg via INTRAVENOUS
  Filled 2019-10-25 (×2): qty 1

## 2019-10-25 MED ORDER — OXYCODONE-ACETAMINOPHEN 5-325 MG PO TABS
1.0000 | ORAL_TABLET | Freq: Once | ORAL | Status: AC
  Administered 2019-10-25: 1 via ORAL
  Filled 2019-10-25: qty 1

## 2019-10-25 MED ORDER — ONDANSETRON HCL 4 MG PO TABS: 4.0000 mg | ORAL_TABLET | Freq: Four times a day (QID) | ORAL | Status: DC | PRN

## 2019-10-25 MED ORDER — AMLODIPINE BESYLATE 10 MG PO TABS
10.0000 mg | ORAL_TABLET | Freq: Every day | ORAL | Status: DC
  Administered 2019-10-26 – 2019-10-28 (×3): 10 mg via ORAL
  Filled 2019-10-25: qty 2
  Filled 2019-10-25 (×2): qty 1

## 2019-10-25 MED ORDER — SODIUM CHLORIDE 0.9 % IV BOLUS
500.0000 mL | Freq: Once | INTRAVENOUS | Status: AC
  Administered 2019-10-25: 500 mL via INTRAVENOUS

## 2019-10-25 MED ORDER — HYDROCHLOROTHIAZIDE 25 MG PO TABS
25.0000 mg | ORAL_TABLET | Freq: Every day | ORAL | Status: DC
  Administered 2019-10-26 – 2019-10-28 (×3): 25 mg via ORAL
  Filled 2019-10-25 (×3): qty 1

## 2019-10-25 MED ORDER — SODIUM CHLORIDE 0.9% FLUSH: 3.0000 mL | Freq: Once | INTRAVENOUS | Status: DC

## 2019-10-25 MED ORDER — PIPERACILLIN-TAZOBACTAM 3.375 G IVPB
3.3750 g | Freq: Three times a day (TID) | INTRAVENOUS | Status: DC
  Administered 2019-10-26 – 2019-10-27 (×5): 3.375 g via INTRAVENOUS
  Filled 2019-10-25 (×5): qty 50

## 2019-10-25 MED ORDER — HYDROMORPHONE HCL 1 MG/ML IJ SOLN
0.5000 mg | INTRAMUSCULAR | Status: DC | PRN
  Administered 2019-10-25 – 2019-10-26 (×6): 0.5 mg via INTRAVENOUS
  Filled 2019-10-25 (×6): qty 1

## 2019-10-25 NOTE — Progress Notes (Signed)
Pharmacy Antibiotic Note  Earl Murray is a 64 y.o. male admitted on 10/25/2019 with intra-abdominal source w/ dilated CBD.  Pharmacy has been consulted for zosyn dosing.  Plan: Zosyn 3.375g IV q8h (4 hour infusion).  Height: 6\' 5"  (195.6 cm) Weight: 93.9 kg (207 lb) IBW/kg (Calculated) : 89.1  Temp (24hrs), Avg:98.5 F (36.9 C), Min:97.8 F (36.6 C), Max:99.2 F (37.3 C)  Recent Labs  Lab 10/25/19 1314  WBC 12.0*  CREATININE 1.23    Estimated Creatinine Clearance: 75.5 mL/min (by C-G formula based on SCr of 1.23 mg/dL).    Allergies  Allergen Reactions  . Tylenol With Codeine #3 [Acetaminophen-Codeine]     Itching  . Motrin [Ibuprofen] Rash  . Tramadol Rash   Thank you for allowing pharmacy to be a part of this patient's care.  Tobie Lords, PharmD, BCPS Clinical Pharmacist 10/25/2019 11:20 PM

## 2019-10-25 NOTE — H&P (Signed)
History and Physical    Earl Murray [id number] DOB: February 12, 1956 DOA: 10/25/2019  PCP: Patient, No Pcp Per   Patient coming from: Home  I have personally briefly reviewed patient's old medical records in [place]  Chief Complaint: Abdominal pain  HPI: Earl Murray is a 64 y.o. male with medical history significant for hypertension who presents to the emergency room for evaluation of periumbilical pain which he has had for over a month but which got worse on the day of admission and has been persistent prompting an emergency room visit.  Pain is mostly in the epigastrium/periumbilical area with radiation to the groin area.  He rates his pain an 8 x 10 in intensity at its worst.  Pain is associated with nausea but no vomiting and he denies having any changes in his bowel habits.  Patient has had pancreatitis in the past and states that this pain feels like when he had acute pancreatitis.  He admits to occasional alcohol use but denies nicotine use He denies having any chest pain, shortness of breath headache, cough, fever or chills, urinary symptoms, dizziness or lightheadedness. He had a CT scan of abdomen and pelvis done which showed dilated CBD up to 17 mm diameter with distal tapering but no definite mass or calculus identified; this is in excess of typical physiologic dilatation seen post cholecystectomy, recommend correlation with LFTs. Indeterminate 9 mm RIGHT renal nodule; consider follow-up ultrasound Characterization. An MRCP was done which showed 1. 3 mm filling defect in the distal common bile duct immediately before the ampulla, compatible with retained choledocholithiasis. This is associated with moderate to severe extrahepatic biliary ductal dilatation and mild intrahepatic biliary ductal dilatation. Although some of this dilatation could be related to chronic post cholecystectomy physiology, if there is clinical evidence of biliary tract obstruction, the choledocholithiasis is  likely a source of obstruction. Bilateral renal lesions, incompletely characterized on today's noncontrast examination but favored to represent Bosniak class 1 and Bosniak class 2 cysts, as detailed above. These could be definitively characterized with follow-up nonemergent abdominal MRIwith and without IV gadolinium if of clinical concern. Labs reveal a white cell count of 12,000 with normal LFTs  Patient noted to have a low-grade fever upon arrival to the ER with a T-max of 7F   ED Course: Patient is a 64 year old male with a past medical history significant for hypertension who presents to the emergency room for evaluation of abdominal pain.  Imaging is suggestive of choledocholithiasis.  Patient has a white count of 12,000 and a low-grade fever.  He will be admitted to the hospital for further evaluation and GI consult has been requested.  Review of Systems: As per HPI otherwise 10 point review of systems negative.    Past Medical History:  Diagnosis Date  . Hypertension   . Neck injury     Past Surgical History:  Procedure Laterality Date  . BACK SURGERY    . ELBOW SURGERY    . FOOT SURGERY    . HERNIA REPAIR       reports that he quit smoking about 17 months ago. His smoking use included cigarettes. He has quit using smokeless tobacco. He reports previous alcohol use. He reports previous drug use.  Allergies  Allergen Reactions  . Tylenol With Codeine #3 [Acetaminophen-Codeine]     Itching  . Motrin [Ibuprofen] Rash  . Tramadol Rash    Family History  Problem Relation Age of Onset  . Hypertension Mother   . Heart disease  Mother   . Colon cancer Father      Prior to Admission medications   Medication Sig Start Date End Date Taking? Authorizing Provider  baclofen (LIORESAL) 10 MG tablet Take 1 tablet (10 mg total) by mouth 3 (three) times daily as needed (Back pain). 03/04/19  Yes Cook, Jayce G, DO  ipratropium (ATROVENT) 0.06 % nasal spray Place 2 sprays into  both nostrils 4 (four) times daily as needed for rhinitis. 10/22/19  Yes Karen Kitchens, NP  levocetirizine (XYZAL) 5 MG tablet Take 1 tablet (5 mg total) by mouth every evening. 09/30/19  Yes Cook, Jayce G, DO  oxyCODONE-acetaminophen (PERCOCET) 10-325 MG tablet Take 1 tablet by mouth every 6 (six) hours as needed. 08/05/19  Yes [provider]  amLODipine (NORVASC) 10 MG tablet amlodipine 10 mg tablet  Take 1 tablet every day by oral route.    [provider]  docusate sodium (COLACE) 100 MG capsule Colace 100 mg capsule  Take 1 capsule twice a day by oral route.    [provider]  hydrochlorothiazide (HYDRODIURIL) 25 MG tablet Take 1 tablet (25 mg total) by mouth daily. 08/15/18   Coral Spikes, DO  gabapentin (NEURONTIN) 300 MG capsule gabapentin 300 mg capsule  one tab bid-tid  03/01/19  [provider]    Physical Exam: Vitals:   10/25/19 1307 10/25/19 1309 10/25/19 1915  BP:  (!) 149/82 (!) 143/88  Pulse:   76  Resp:  16 16  Temp:  99.2 F (37.3 C) 97.8 F (36.6 C)  TempSrc:  Oral Oral  SpO2:  98% 99%  Weight: 93.9 kg    Height: 6\' 5"  (1.956 m)       Vitals:   10/25/19 1307 10/25/19 1309 10/25/19 1915  BP:  (!) 149/82 (!) 143/88  Pulse:   76  Resp:  16 16  Temp:  99.2 F (37.3 C) 97.8 F (36.6 C)  TempSrc:  Oral Oral  SpO2:  98% 99%  Weight: 93.9 kg    Height: 6\' 5"  (1.956 m)      Constitutional: NAD, alert and oriented x 3.  Appears uncomfortable Eyes: PERRL, lids and conjunctivae normal ENMT: Mucous membranes are moist.  Neck: normal, supple, no masses, no thyromegaly Respiratory: clear to auscultation bilaterally, no wheezing, no crackles. Normal respiratory effort. No accessory muscle use.  Cardiovascular: Regular rate and rhythm,no murmurs / rubs / gallops. No extremity edema. 2+ pedal pulses. No carotid bruits.  Abdomen: tenderness in the epigastrium and periumbilical area, no masses palpated. No hepatosplenomegaly. Bowel  sounds positive.  Musculoskeletal: no clubbing / cyanosis. No joint deformity upper and lower extremities.  Skin: no rashes, lesions, ulcers.  Neurologic: No gross focal neurologic deficit. Psychiatric: Normal mood and affect.   Labs on Admission: I have personally reviewed following labs and imaging studies  CBC: Recent Labs  Lab 10/25/19 1314  WBC 12.0*  HGB 15.0  HCT 43.2  MCV 91.1  PLT [id number]   Basic Metabolic Panel: Recent Labs  Lab 10/25/19 1314  NA 138  K 4.0  CL 101  CO2 28  GLUCOSE 104*  BUN 21  CREATININE 1.23  CALCIUM 9.8   GFR: Estimated Creatinine Clearance: 75.5 mL/min (by C-G formula based on SCr of 1.23 mg/dL). Liver Function Tests: Recent Labs  Lab 10/25/19 1314  AST 21  ALT 21  ALKPHOS 72  BILITOT 0.7  PROT 7.9  ALBUMIN 4.4   Recent Labs  Lab 10/25/19 1314  LIPASE 39  No results for input(s): AMMONIA in the last 168 hours. Coagulation Profile: No results for input(s): INR, PROTIME in the last 168 hours. Cardiac Enzymes: No results for input(s): CKTOTAL, CKMB, CKMBINDEX, TROPONINI in the last 168 hours. BNP (last 3 results) No results for input(s): PROBNP in the last 8760 hours. HbA1C: No results for input(s): HGBA1C in the last 72 hours. CBG: No results for input(s): GLUCAP in the last 168 hours. Lipid Profile: No results for input(s): CHOL, HDL, LDLCALC, TRIG, CHOLHDL, LDLDIRECT in the last 72 hours. Thyroid Function Tests: No results for input(s): TSH, T4TOTAL, FREET4, T3FREE, THYROIDAB in the last 72 hours. Anemia Panel: No results for input(s): VITAMINB12, FOLATE, FERRITIN, TIBC, IRON, RETICCTPCT in the last 72 hours. Urine analysis:    Component Value Date/Time   COLORURINE STRAW (A) 10/25/2019 1314   APPEARANCEUR CLEAR (A) 10/25/2019 1314   LABSPEC 1.017 10/25/2019 1314   PHURINE 5.0 10/25/2019 1314   GLUCOSEU NEGATIVE 10/25/2019 1314   HGBUR NEGATIVE 10/25/2019 1314   BILIRUBINUR NEGATIVE 10/25/2019 1314   KETONESUR  NEGATIVE 10/25/2019 1314   PROTEINUR NEGATIVE 10/25/2019 1314   NITRITE NEGATIVE 10/25/2019 1314   LEUKOCYTESUR NEGATIVE 10/25/2019 1314    Radiological Exams on Admission: CT ABDOMEN PELVIS W CONTRAST  Result Date: 10/25/2019 CLINICAL DATA:  Umbilical abdominal pain for 1 day, awoke patient this morning at 0400 hours, history of pancreatitis, suspected intra-abdominal infection/abscess, former smoker, prior cholecystectomy and hernia repairs, hypertension, back surgery EXAM: CT ABDOMEN AND PELVIS WITH CONTRAST TECHNIQUE: Multidetector CT imaging of the abdomen and pelvis was performed using the standard protocol following bolus administration of intravenous contrast. Sagittal and coronal MPR images reconstructed from axial data set. CONTRAST:  154mL OMNIPAQUE IOHEXOL 300 MG/ML SOLN IV. No oral contrast. COMPARISON:  None. FINDINGS: Lower chest: Lung bases clear Hepatobiliary: Gallbladder surgically absent. Tiny nonspecific low-attenuation focus liver image 28. Liver otherwise unremarkable. Pancreas: Dilated CBD at pancreatic head up to 17 mm diameter with distal tapering. No definite mass identified. Remainder of pancreas normal appearance without surrounding inflammatory changes. Spleen: Normal appearance Adrenals/Urinary Tract: Adrenal glands normal appearance. LEFT renal cysts. Indeterminate intermediate attenuation RIGHT renal nodule 9 mm diameter. No urinary tract calcification or dilatation. Ureters and bladder unremarkable. Stomach/Bowel: Normal appendix. Colon normal appearance without diverticulosis or diverticulitis. Stomach and small bowel loops normal appearance. Vascular/Lymphatic: Scattered pelvic phleboliths. Atherosclerotic calcifications aorta and iliac arteries without aneurysm. Vascular structures patent. No adenopathy. Reproductive: Unremarkable prostate gland and seminal vesicles Other: No free air or free fluid. No definite inflammatory process or hernia. Musculoskeletal: Prior lumbar  fusion L3-S1. No acute osseous findings. IMPRESSION: Dilated CBD up to 17 mm diameter with distal tapering but no definite mass or calculus identified; this is in excess of typical physiologic dilatation seen post cholecystectomy, recommend correlation with LFTs. LEFT renal cysts. Indeterminate 9 mm RIGHT renal nodule; consider follow-up ultrasound characterization. No other intra-abdominal or intrapelvic abnormalities. Aortic Atherosclerosis (ICD10-I70.0). Electronically Signed   By: Lavonia Dana M.D.   On: 10/25/2019 16:40   MR ABDOMEN MRCP WO CONTRAST  Result Date: 10/25/2019 CLINICAL DATA:  64 year old male with history of dilated common bile duct. Evaluated for potential choledocholithiasis. EXAM: MRI ABDOMEN WITHOUT CONTRAST  (INCLUDING MRCP) TECHNIQUE: Multiplanar multisequence MR imaging of the abdomen was performed. Heavily T2-weighted images of the biliary and pancreatic ducts were obtained, and three-dimensional MRCP images were rendered by post processing. COMPARISON:  No prior abdominal MRI. CT the abdomen and pelvis 10/25/2019. FINDINGS: Comment: Study is limited by lack of IV  gadolinium for detection and characterization of visceral and/or vascular lesions. Lower chest: Unremarkable. Hepatobiliary: No suspicious cystic or solid hepatic lesions are confidently identified on today's noncontrast examination. There is mild intrahepatic biliary ductal dilatation and moderate to severe extrahepatic biliary ductal dilatation with the common bile duct noted to measure up to 21 mm in the porta hepatis on MRCP images. Status post cholecystectomy. In the distal common bile duct immediately before the ampulla there is a small filling defect measuring 3 mm (coronal image 9 of series 15), compatible with a retained ductal stone. Pancreas: No definite pancreatic mass or peripancreatic fluid collections or inflammatory changes noted on today's noncontrast examination. No pancreatic ductal dilatation noted on MRCP  images. Spleen:  Unremarkable. Adrenals/Urinary Tract: In the anterior aspect of the interpolar region of the right kidney there is a 6 mm T1 hyperintense, T2 hypointense lesion which is incompletely characterized on today's noncontrast examination, but statistically likely to represent a proteinaceous/hemorrhagic cyst. Three T1 hypointense, T2 hyperintense lesions are noted in the left kidney, also not characterized on today's noncontrast examination, but statistically likely to represent cysts, largest of which is in the interpolar region measuring 2.4 cm in diameter. Stomach/Bowel: Visualized portions are unremarkable. Vascular/Lymphatic: No aneurysm identified in the visualized abdominal vasculature. No lymphadenopathy noted in the abdomen. Other: No significant volume of ascites noted in the visualized portions of the peritoneal cavity. Musculoskeletal: No aggressive appearing osseous lesions are noted in the visualized portions of the skeleton. Orthopedic fixation hardware in the lumbar spine incompletely imaged. IMPRESSION: 1. 3 mm filling defect in the distal common bile duct immediately before the ampulla, compatible with retained choledocholithiasis. This is associated with moderate to severe extrahepatic biliary ductal dilatation and mild intrahepatic biliary ductal dilatation. Although some of this dilatation could be related to chronic post cholecystectomy physiology, if there is clinical evidence of biliary tract obstruction, the choledocholithiasis is likely a source of obstruction. 2. Bilateral renal lesions, incompletely characterized on today's noncontrast examination but favored to represent Bosniak class 1 and Bosniak class 2 cysts, as detailed above. These could be definitively characterized with follow-up nonemergent abdominal MRI with and without IV gadolinium if of clinical concern. Electronically Signed   By: Vinnie Langton M.D.   On: 10/25/2019 20:04   MR 3D Recon At Scanner  Result  Date: 10/25/2019 CLINICAL DATA:  64 year old male with history of dilated common bile duct. Evaluated for potential choledocholithiasis. EXAM: MRI ABDOMEN WITHOUT CONTRAST  (INCLUDING MRCP) TECHNIQUE: Multiplanar multisequence MR imaging of the abdomen was performed. Heavily T2-weighted images of the biliary and pancreatic ducts were obtained, and three-dimensional MRCP images were rendered by post processing. COMPARISON:  No prior abdominal MRI. CT the abdomen and pelvis 10/25/2019. FINDINGS: Comment: Study is limited by lack of IV gadolinium for detection and characterization of visceral and/or vascular lesions. Lower chest: Unremarkable. Hepatobiliary: No suspicious cystic or solid hepatic lesions are confidently identified on today's noncontrast examination. There is mild intrahepatic biliary ductal dilatation and moderate to severe extrahepatic biliary ductal dilatation with the common bile duct noted to measure up to 21 mm in the porta hepatis on MRCP images. Status post cholecystectomy. In the distal common bile duct immediately before the ampulla there is a small filling defect measuring 3 mm (coronal image 9 of series 15), compatible with a retained ductal stone. Pancreas: No definite pancreatic mass or peripancreatic fluid collections or inflammatory changes noted on today's noncontrast examination. No pancreatic ductal dilatation noted on MRCP images. Spleen:  Unremarkable. Adrenals/Urinary Tract:  In the anterior aspect of the interpolar region of the right kidney there is a 6 mm T1 hyperintense, T2 hypointense lesion which is incompletely characterized on today's noncontrast examination, but statistically likely to represent a proteinaceous/hemorrhagic cyst. Three T1 hypointense, T2 hyperintense lesions are noted in the left kidney, also not characterized on today's noncontrast examination, but statistically likely to represent cysts, largest of which is in the interpolar region measuring 2.4 cm in  diameter. Stomach/Bowel: Visualized portions are unremarkable. Vascular/Lymphatic: No aneurysm identified in the visualized abdominal vasculature. No lymphadenopathy noted in the abdomen. Other: No significant volume of ascites noted in the visualized portions of the peritoneal cavity. Musculoskeletal: No aggressive appearing osseous lesions are noted in the visualized portions of the skeleton. Orthopedic fixation hardware in the lumbar spine incompletely imaged. IMPRESSION: 1. 3 mm filling defect in the distal common bile duct immediately before the ampulla, compatible with retained choledocholithiasis. This is associated with moderate to severe extrahepatic biliary ductal dilatation and mild intrahepatic biliary ductal dilatation. Although some of this dilatation could be related to chronic post cholecystectomy physiology, if there is clinical evidence of biliary tract obstruction, the choledocholithiasis is likely a source of obstruction. 2. Bilateral renal lesions, incompletely characterized on today's noncontrast examination but favored to represent Bosniak class 1 and Bosniak class 2 cysts, as detailed above. These could be definitively characterized with follow-up nonemergent abdominal MRI with and without IV gadolinium if of clinical concern. Electronically Signed   By: Vinnie Langton M.D.   On: 10/25/2019 20:04    EKG: Independently reviewed.   Assessment/Plan Active Problems:   Hypertension    Choledocholithiasis Patient presents for evaluation of abdominal pain mostly in the periumbilical/epigastric area associated with abdominal pain CT scan of the abdomen and pelvis showed a dilated common bile duct and MRCP was obtained which showed choledocholithiasis Patient has a white count of 12,000 and a low-grade fever We will place patient on IV antibiotic therapy with Zosyn adjusted to renal function GI consult Keep patient NPO   Hypertension Will place patient on Hydralazine 10mg  IV q 6  for SBP > 191mmHg  DVT prophylaxis: SCD Code Status: Full Family Communication: 50% of time was spent discussing patient's condition and plan of care with him and his wife at the bedside. Disposition Plan: Back to previous home environment Consults called: GI    Tremain Rucinski MD [facility]     10/25/2019, 10:45 PM

## 2019-10-25 NOTE — ED Triage Notes (Signed)
C/O umbilical abdominal pain x 1 day.  States pain started yesterday and then woke patient up this morning at around 0400, patient has been up since that time.  Patient has had pancreatitis in the past, and pain is similar to that.  Denies N/V/D

## 2019-10-25 NOTE — ED Triage Notes (Signed)
Pt in via EMS from home with c/o severe abd pain, stabbing in nature. Pt 111mcg of fentanyl IM. 196/106.  98HR, 97.9 Temp FSBS 111

## 2019-10-25 NOTE — ED Provider Notes (Signed)
[facility] Emergency Department Provider Note    First MD Initiated Contact with Patient 10/25/19 1529     (approximate)  I have reviewed the triage vital signs and the nursing notes.   HISTORY  Chief Complaint Abdominal Pain    HPI Earl Murray is a 64 y.o. male with the below listed past medical history as well as self-reported history of pancreatitis presents to the ER for evaluation of epigastric pain nonradiating bilateral lower groins for the past 24 hours.  Discomfort became more significant around 4 AM this morning.  Denies any fevers he is having some nausea but no vomiting.  Stool has been soft liquid.  Denies any melena or hematochezia.  Denies any history of diverticulitis.  Nuys any back pain.    Past Medical History:  Diagnosis Date  . Hypertension   . Neck injury    Family History  Problem Relation Age of Onset  . Hypertension Mother   . Heart disease Mother   . Colon cancer Father    Past Surgical History:  Procedure Laterality Date  . BACK SURGERY    . ELBOW SURGERY    . FOOT SURGERY    . HERNIA REPAIR     Patient Active Problem List   Diagnosis Date Noted  . Hallux valgus, acquired 04/20/2016  . 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 Stress reaction 04/20/2016      Prior to Admission medications   Medication Sig Start Date End Date Taking? Authorizing Provider  baclofen (LIORESAL) 10 MG tablet Take 1 tablet (10 mg total) by mouth 3 (three) times daily as needed (Back pain). 03/04/19  Yes Cook, Jayce G, DO  ipratropium (ATROVENT) 0.06 % nasal spray Place 2 sprays into both nostrils 4 (four) times daily as needed for rhinitis. 10/22/19  Yes Karen Kitchens, NP  levocetirizine (XYZAL) 5 MG tablet Take 1 tablet (5 mg total) by mouth every evening. 09/30/19  Yes Cook, Jayce G, DO  oxyCODONE-acetaminophen (PERCOCET) 10-325 MG tablet Take 1 tablet by mouth every 6 (six) hours as needed. 08/05/19  Yes [provider]   amLODipine (NORVASC) 10 MG tablet amlodipine 10 mg tablet  Take 1 tablet every day by oral route.    [provider]  docusate sodium (COLACE) 100 MG capsule Colace 100 mg capsule  Take 1 capsule twice a day by oral route.    [provider]  hydrochlorothiazide (HYDRODIURIL) 25 MG tablet Take 1 tablet (25 mg total) by mouth daily. 08/15/18   Coral Spikes, DO  gabapentin (NEURONTIN) 300 MG capsule gabapentin 300 mg capsule  one tab bid-tid  03/01/19  [provider]    Allergies Tylenol with codeine #3 [acetaminophen-codeine], Motrin [ibuprofen], and Tramadol    Social History Social History   Tobacco Use  . Smoking status: Former Smoker    Types: Cigarettes    Quit date: 05/2018    Years since quitting: 1.4  . Smokeless tobacco: Former Network engineer Use Topics  . Alcohol use: Not Currently  . Drug use: Not Currently    Review of Systems Patient denies headaches, rhinorrhea, blurry vision, numbness, shortness of breath, chest pain, edema, cough, abdominal pain, nausea, vomiting, diarrhea, dysuria, fevers, rashes or hallucinations unless otherwise stated above in HPI. ____________________________________________   PHYSICAL EXAM:  VITAL SIGNS: Vitals:   10/25/19 1309 10/25/19 1915  BP: (!) 149/82 (!) 143/88  Pulse:  76  Resp: 16 16  Temp: 99.2 F (37.3 C)  97.8 F (36.6 C)  SpO2: 98% 99%    Constitutional: Alert and oriented.  Eyes: Conjunctivae are normal.  Head: Atraumatic. Nose: No congestion/rhinnorhea. Mouth/Throat: Mucous membranes are moist.   Neck: No stridor. Painless ROM.  Cardiovascular: Normal rate, regular rhythm. Grossly normal heart sounds.  Good peripheral circulation. Respiratory: Normal respiratory effort.  No retractions. Lungs CTAB. Gastrointestinal: Soft and nontender. No distention. No abdominal bruits. No CVA tenderness. Genitourinary:  Musculoskeletal: No lower extremity tenderness nor edema.  No joint  effusions. Neurologic:  Normal speech and language. No gross focal neurologic deficits are appreciated. No facial droop Skin:  Skin is warm, dry and intact. No rash noted. Psychiatric: Mood and affect are normal. Speech and behavior are normal.  ____________________________________________   LABS (all labs ordered are listed, but only abnormal results are displayed)  Results for orders placed or performed during the hospital encounter of 10/25/19 (from the past 24 hour(s))  Lipase, blood     Status: None   Collection Time: 10/25/19  1:14 PM  Result Value Ref Range   Lipase 39 11 - 51 U/L  Comprehensive metabolic panel     Status: Abnormal   Collection Time: 10/25/19  1:14 PM  Result Value Ref Range   Sodium 138 135 - 145 mmol/L   Potassium 4.0 3.5 - 5.1 mmol/L   Chloride 101 98 - 111 mmol/L   CO2 28 22 - 32 mmol/L   Glucose, Bld 104 (H) 70 - 99 mg/dL   BUN 21 8 - 23 mg/dL   Creatinine, Ser 1.23 0.61 - 1.24 mg/dL   Calcium 9.8 8.9 - 10.3 mg/dL   Total Protein 7.9 6.5 - 8.1 g/dL   Albumin 4.4 3.5 - 5.0 g/dL   AST 21 15 - 41 U/L   ALT 21 0 - 44 U/L   Alkaline Phosphatase 72 38 - 126 U/L   Total Bilirubin 0.7 0.3 - 1.2 mg/dL   GFR calc non Af Amer >60 >60 mL/min   GFR calc Af Amer >60 >60 mL/min   Anion gap 9 5 - 15  CBC     Status: Abnormal   Collection Time: 10/25/19  1:14 PM  Result Value Ref Range   WBC 12.0 (H) 4.0 - 10.5 K/uL   RBC 4.74 4.22 - 5.81 MIL/uL   Hemoglobin 15.0 13.0 - 17.0 g/dL   HCT 43.2 39.0 - 52.0 %   MCV 91.1 80.0 - 100.0 fL   MCH 31.6 26.0 - 34.0 pg   MCHC 34.7 30.0 - 36.0 g/dL   RDW 15.1 11.5 - 15.5 %   Platelets 295 150 - 400 K/uL   nRBC 0.0 0.0 - 0.2 %  Urinalysis, Complete w Microscopic     Status: Abnormal   Collection Time: 10/25/19  1:14 PM  Result Value Ref Range   Color, Urine STRAW (A) YELLOW   APPearance CLEAR (A) CLEAR   Specific Gravity, Urine 1.017 1.005 - 1.030   pH 5.0 5.0 - 8.0   Glucose, UA NEGATIVE NEGATIVE mg/dL   Hgb  urine dipstick NEGATIVE NEGATIVE   Bilirubin Urine NEGATIVE NEGATIVE   Ketones, ur NEGATIVE NEGATIVE mg/dL   Protein, ur NEGATIVE NEGATIVE mg/dL   Nitrite NEGATIVE NEGATIVE   Leukocytes,Ua NEGATIVE NEGATIVE   RBC / HPF 0-5 0 - 5 RBC/hpf   WBC, UA 0-5 0 - 5 WBC/hpf   Bacteria, UA NONE SEEN NONE SEEN   Squamous Epithelial / LPF NONE SEEN 0 - 5   Mucus PRESENT  ___________________________________________________  RADIOLOGY  I personally reviewed all radiographic images ordered to evaluate for the above acute complaints and reviewed radiology reports and findings.  These findings were personally discussed with the patient.  Please see medical record for radiology report.  ____________________________________________   PROCEDURES  Procedure(s) performed:  Procedures    Critical Care performed: no ____________________________________________   INITIAL IMPRESSION / ASSESSMENT AND PLAN / ED COURSE  Pertinent labs & imaging results that were available during my care of the patient were reviewed by me and considered in my medical decision making (see chart for details).   DDX: pancreatitis, enteritis, colitis, sbo, diverticulitis, appendicitis  Renel Klinge is a 64 y.o. who presents to the ED with symptoms as described above.  Patient with low-grade temperature borderline white count.  Does not have any guarding or rebound on exam but given presentation will order CT imaging to further evaluate.  Will provide IV fluids as well as IV pain medication  Clinical Course as of Oct 24 2017  Wed Oct 25, 2019  1802 Case was discussed with Dr. Marius Ditch of GI who recommends MRCP given concern for obstruction.  LFTs are normal.  If patient still having pain or abnormality on MRCP will be admitted for further work-up.  If MRCP negative will be appropriate for outpatient follow-up.   [PR]  2017 Results discussed with patient.  Again discussed the case in consultation with Dr. Marius Ditch of GI who  recommends admission to the hospital agrees with plan for IV antibiotics IV fluids and to keep n.p.o. after midnight for ERCP.  Patient agrees with plan.  Will discuss with hospitalist for admission.   [PR]    Clinical Course User Index [PR] Merlyn Lot, MD    The patient was evaluated in Emergency Department today for the symptoms described in the history of present illness. He/she was evaluated in the context of the global COVID-19 pandemic, which necessitated consideration that the patient might be at risk for infection with the SARS-CoV-2 virus that causes COVID-19. Institutional protocols and algorithms that pertain to the evaluation of patients at risk for COVID-19 are in a state of rapid change based on information released by regulatory bodies including the CDC and federal and state organizations. These policies and algorithms were followed during the patient's care in the ED.  As part of my medical decision making, I reviewed the following data within the [place] notes reviewed and incorporated, Labs reviewed, notes from prior ED visits and [place] Controlled Substance Database   ____________________________________________   FINAL CLINICAL IMPRESSION(S) / ED DIAGNOSES  Final diagnoses:  Choledocholithiasis      NEW MEDICATIONS STARTED DURING THIS VISIT:  New Prescriptions   No medications on file     Note:  This document was prepared using Dragon voice recognition software and may include unintentional dictation errors.    Merlyn Lot, MD 10/25/19 2019

## 2019-10-26 ENCOUNTER — Encounter: Payer: Self-pay | Admitting: Internal Medicine

## 2019-10-26 LAB — COMPREHENSIVE METABOLIC PANEL
ALT: 20 U/L (ref 0–44)
AST: 21 U/L (ref 15–41)
Albumin: 4.1 g/dL (ref 3.5–5.0)
Alkaline Phosphatase: 63 U/L (ref 38–126)
Anion gap: 9 (ref 5–15)
BUN: 20 mg/dL (ref 8–23)
CO2: 27 mmol/L (ref 22–32)
Calcium: 9.3 mg/dL (ref 8.9–10.3)
Chloride: 101 mmol/L (ref 98–111)
Creatinine, Ser: 1.17 mg/dL (ref 0.61–1.24)
GFR calc Af Amer: >60 mL/min (ref >60)
GFR calc non Af Amer: >60 mL/min (ref >60)
Glucose, Bld: 119 mg/dL — ABNORMAL HIGH (ref 70–99)
Potassium: 4.1 mmol/L (ref 3.5–5.1)
Sodium: 137 mmol/L (ref 135–145)
Total Bilirubin: 0.8 mg/dL (ref 0.3–1.2)
Total Protein: 7.4 g/dL (ref 6.5–8.1)

## 2019-10-26 LAB — BASIC METABOLIC PANEL
Anion gap: 10 (ref 5–15)
BUN: 22 mg/dL (ref 8–23)
CO2: 26 mmol/L (ref 22–32)
Calcium: 9 mg/dL (ref 8.9–10.3)
Chloride: 102 mmol/L (ref 98–111)
Creatinine, Ser: 1.21 mg/dL (ref 0.61–1.24)
GFR calc Af Amer: >60 mL/min (ref >60)
GFR calc non Af Amer: >60 mL/min (ref >60)
Glucose, Bld: 108 mg/dL — ABNORMAL HIGH (ref 70–99)
Potassium: 3.9 mmol/L (ref 3.5–5.1)
Sodium: 138 mmol/L (ref 135–145)

## 2019-10-26 LAB — CBC
HCT: 40 % (ref 39.0–52.0)
Hemoglobin: 13.6 g/dL (ref 13.0–17.0)
MCH: 31.5 pg (ref 26.0–34.0)
MCHC: 34 g/dL (ref 30.0–36.0)
MCV: 92.6 fL (ref 80.0–100.0)
Platelets: 273 10*3/uL (ref 150–400)
RBC: 4.32 MIL/uL (ref 4.22–5.81)
RDW: 14.9 % (ref 11.5–15.5)
WBC: 10.5 10*3/uL (ref 4.0–10.5)
nRBC: 0 % (ref 0.0–0.2)

## 2019-10-26 LAB — HIV ANTIBODY (ROUTINE TESTING W REFLEX): HIV Screen 4th Generation wRfx: NONREACTIVE

## 2019-10-26 MED ORDER — HYDROMORPHONE HCL 1 MG/ML IJ SOLN
1.0000 mg | INTRAMUSCULAR | Status: DC | PRN
  Administered 2019-10-26 – 2019-10-27 (×8): 1 mg via INTRAVENOUS
  Filled 2019-10-26 (×8): qty 1

## 2019-10-26 MED ORDER — CHLORHEXIDINE GLUCONATE CLOTH 2 % EX PADS
6.0000 | MEDICATED_PAD | Freq: Every day | CUTANEOUS | Status: DC
  Administered 2019-10-27: 6 via TOPICAL

## 2019-10-26 MED ORDER — OXYCODONE-ACETAMINOPHEN 5-325 MG PO TABS
1.0000 | ORAL_TABLET | ORAL | Status: DC | PRN
  Administered 2019-10-27 – 2019-10-28 (×3): 1 via ORAL
  Filled 2019-10-26 (×4): qty 1

## 2019-10-26 MED ORDER — SODIUM CHLORIDE 0.9% FLUSH: 10.0000 mL | INTRAVENOUS | Status: DC | PRN

## 2019-10-26 NOTE — Consult Note (Signed)
Vonda Antigua, MD [address] [place], [place], [place], [address] [address], [place], [place], [address] Phone: [phone]  Fax: [phone]  Consultation  Referring Provider:     Dr. Posey Pronto Primary Care Physician:  Patient, No Pcp Per Reason for Consultation:   Choledocholithiasis  Date of Admission:  10/25/2019 Date of Consultation:  10/26/2019         HPI:   Earl Murray is a 64 y.o. male with 2-day history of periumbilical abdominal pain, dull, nonradiating, 8/10, not associated with any nausea or vomiting.  History of pancreatitis about 2 years ago, reportedly gallstone pancreatitis as per patient and patient subsequently had gallbladder removal after that admission.  Records not available.  On admission liver enzymes noted to be normal.  CT showed dilated CBD and pancreatic head up to 17 mm with distal tapering.  MRCP reporting 3 mm filling defect in the common bile duct.  Past Medical History:  Diagnosis Date  . Hypertension   . Neck injury     Past Surgical History:  Procedure Laterality Date  . BACK SURGERY    . ELBOW SURGERY    . FOOT SURGERY    . HERNIA REPAIR      Prior to Admission medications   Medication Sig Start Date End Date Taking? Authorizing Provider  amLODipine (NORVASC) 10 MG tablet Take 10 mg by mouth daily.    Yes [provider]  baclofen (LIORESAL) 10 MG tablet Take 1 tablet (10 mg total) by mouth 3 (three) times daily as needed (Back pain). 03/04/19  Yes Cook, Jayce G, DO  hydrochlorothiazide (HYDRODIURIL) 25 MG tablet Take 1 tablet (25 mg total) by mouth daily. 08/15/18  Yes Cook, Jayce G, DO  ipratropium (ATROVENT) 0.06 % nasal spray Place 2 sprays into both nostrils 4 (four) times daily as needed for rhinitis. 10/22/19  Yes Karen Kitchens, NP  levocetirizine (XYZAL) 5 MG tablet Take 1 tablet (5 mg total) by mouth every evening. 09/30/19  Yes Cook, Jayce G, DO  oxyCODONE-acetaminophen (PERCOCET) 10-325 MG tablet Take 1  tablet by mouth every 6 (six) hours as needed for pain.  08/05/19  Yes [provider]  gabapentin (NEURONTIN) 300 MG capsule gabapentin 300 mg capsule  one tab bid-tid  03/01/19  [provider]    Family History  Problem Relation Age of Onset  . Hypertension Mother   . Heart disease Mother   . Colon cancer Father      Social History   Tobacco Use  . Smoking status: Former Smoker    Types: Cigarettes    Quit date: 05/2018    Years since quitting: 1.4  . Smokeless tobacco: Former Network engineer Use Topics  . Alcohol use: Not Currently  . Drug use: Not Currently    Allergies as of 10/25/2019 - Review Complete 10/25/2019  Allergen Reaction Noted  . Tylenol with codeine #3 [acetaminophen-codeine]  03/01/2019  . Motrin [ibuprofen] Rash 10/23/2017  . Tramadol Rash 10/23/2017    Review of Systems:    All systems reviewed and negative except where noted in HPI.   Physical Exam:  Vital signs in last 24 hours: Vitals:   10/26/19 0658 10/26/19 0900 10/26/19 0930 10/26/19 1120  BP: 121/85 122/75 123/77 131/71  Pulse: 62 (!) 59 63 67  Resp: 16 16 18 18   Temp:      TempSrc:      SpO2: 95% 94% 96% 96%  Weight:  Height:         General:   Pleasant, cooperative in NAD Head:  Normocephalic and atraumatic. Eyes:   No icterus.   Conjunctiva pink. PERRLA. Ears:  Normal auditory acuity. Neck:  Supple; no masses or thyroidomegaly Lungs: Respirations even and unlabored. Lungs clear to auscultation bilaterally.   No wheezes, crackles, or rhonchi.  Abdomen:  Soft, nondistended, nontender. Normal bowel sounds. No appreciable masses or hepatomegaly.  No rebound or guarding.  Neurologic:  Alert and oriented x3;  grossly normal neurologically. Skin:  Intact without significant lesions or rashes. Cervical Nodes:  No significant cervical adenopathy. Psych:  Alert and cooperative. Normal affect.  LAB RESULTS: Recent Labs    10/25/19 1314 10/26/19 0449  WBC 12.0*  10.5  HGB 15.0 13.6  HCT 43.2 40.0  PLT 295 273   BMET Recent Labs    10/25/19 1314 10/26/19 0449 10/26/19 0956  NA 138 138 137  K 4.0 3.9 4.1  CL 101 102 101  CO2 28 26 27   GLUCOSE 104* 108* 119*  BUN 21 22 20   CREATININE 1.23 1.21 1.17  CALCIUM 9.8 9.0 9.3   LFT Recent Labs    10/26/19 0956  PROT 7.4  ALBUMIN 4.1  AST 21  ALT 20  ALKPHOS 63  BILITOT 0.8   PT/INR No results for input(s): LABPROT, INR in the last 72 hours.  STUDIES: CT ABDOMEN PELVIS W CONTRAST  Result Date: 10/25/2019 CLINICAL DATA:  Umbilical abdominal pain for 1 day, awoke patient this morning at 0400 hours, history of pancreatitis, suspected intra-abdominal infection/abscess, former smoker, prior cholecystectomy and hernia repairs, hypertension, back surgery EXAM: CT ABDOMEN AND PELVIS WITH CONTRAST TECHNIQUE: Multidetector CT imaging of the abdomen and pelvis was performed using the standard protocol following bolus administration of intravenous contrast. Sagittal and coronal MPR images reconstructed from axial data set. CONTRAST:  111mL OMNIPAQUE IOHEXOL 300 MG/ML SOLN IV. No oral contrast. COMPARISON:  None. FINDINGS: Lower chest: Lung bases clear Hepatobiliary: Gallbladder surgically absent. Tiny nonspecific low-attenuation focus liver image 28. Liver otherwise unremarkable. Pancreas: Dilated CBD at pancreatic head up to 17 mm diameter with distal tapering. No definite mass identified. Remainder of pancreas normal appearance without surrounding inflammatory changes. Spleen: Normal appearance Adrenals/Urinary Tract: Adrenal glands normal appearance. LEFT renal cysts. Indeterminate intermediate attenuation RIGHT renal nodule 9 mm diameter. No urinary tract calcification or dilatation. Ureters and bladder unremarkable. Stomach/Bowel: Normal appendix. Colon normal appearance without diverticulosis or diverticulitis. Stomach and small bowel loops normal appearance. Vascular/Lymphatic: Scattered pelvic  phleboliths. Atherosclerotic calcifications aorta and iliac arteries without aneurysm. Vascular structures patent. No adenopathy. Reproductive: Unremarkable prostate gland and seminal vesicles Other: No free air or free fluid. No definite inflammatory process or hernia. Musculoskeletal: Prior lumbar fusion L3-S1. No acute osseous findings. IMPRESSION: Dilated CBD up to 17 mm diameter with distal tapering but no definite mass or calculus identified; this is in excess of typical physiologic dilatation seen post cholecystectomy, recommend correlation with LFTs. LEFT renal cysts. Indeterminate 9 mm RIGHT renal nodule; consider follow-up ultrasound characterization. No other intra-abdominal or intrapelvic abnormalities. Aortic Atherosclerosis (ICD10-I70.0). Electronically Signed   By: Lavonia Dana M.D.   On: 10/25/2019 16:40   MR ABDOMEN MRCP WO CONTRAST  Result Date: 10/25/2019 CLINICAL DATA:  64 year old male with history of dilated common bile duct. Evaluated for potential choledocholithiasis. EXAM: MRI ABDOMEN WITHOUT CONTRAST  (INCLUDING MRCP) TECHNIQUE: Multiplanar multisequence MR imaging of the abdomen was performed. Heavily T2-weighted images of the biliary and pancreatic ducts were obtained,  and three-dimensional MRCP images were rendered by post processing. COMPARISON:  No prior abdominal MRI. CT the abdomen and pelvis 10/25/2019. FINDINGS: Comment: Study is limited by lack of IV gadolinium for detection and characterization of visceral and/or vascular lesions. Lower chest: Unremarkable. Hepatobiliary: No suspicious cystic or solid hepatic lesions are confidently identified on today's noncontrast examination. There is mild intrahepatic biliary ductal dilatation and moderate to severe extrahepatic biliary ductal dilatation with the common bile duct noted to measure up to 21 mm in the porta hepatis on MRCP images. Status post cholecystectomy. In the distal common bile duct immediately before the ampulla there  is a small filling defect measuring 3 mm (coronal image 9 of series 15), compatible with a retained ductal stone. Pancreas: No definite pancreatic mass or peripancreatic fluid collections or inflammatory changes noted on today's noncontrast examination. No pancreatic ductal dilatation noted on MRCP images. Spleen:  Unremarkable. Adrenals/Urinary Tract: In the anterior aspect of the interpolar region of the right kidney there is a 6 mm T1 hyperintense, T2 hypointense lesion which is incompletely characterized on today's noncontrast examination, but statistically likely to represent a proteinaceous/hemorrhagic cyst. Three T1 hypointense, T2 hyperintense lesions are noted in the left kidney, also not characterized on today's noncontrast examination, but statistically likely to represent cysts, largest of which is in the interpolar region measuring 2.4 cm in diameter. Stomach/Bowel: Visualized portions are unremarkable. Vascular/Lymphatic: No aneurysm identified in the visualized abdominal vasculature. No lymphadenopathy noted in the abdomen. Other: No significant volume of ascites noted in the visualized portions of the peritoneal cavity. Musculoskeletal: No aggressive appearing osseous lesions are noted in the visualized portions of the skeleton. Orthopedic fixation hardware in the lumbar spine incompletely imaged. IMPRESSION: 1. 3 mm filling defect in the distal common bile duct immediately before the ampulla, compatible with retained choledocholithiasis. This is associated with moderate to severe extrahepatic biliary ductal dilatation and mild intrahepatic biliary ductal dilatation. Although some of this dilatation could be related to chronic post cholecystectomy physiology, if there is clinical evidence of biliary tract obstruction, the choledocholithiasis is likely a source of obstruction. 2. Bilateral renal lesions, incompletely characterized on today's noncontrast examination but favored to represent Bosniak  class 1 and Bosniak class 2 cysts, as detailed above. These could be definitively characterized with follow-up nonemergent abdominal MRI with and without IV gadolinium if of clinical concern. Electronically Signed   By: Vinnie Langton M.D.   On: 10/25/2019 20:04   MR 3D Recon At Scanner  Result Date: 10/25/2019 CLINICAL DATA:  64 year old male with history of dilated common bile duct. Evaluated for potential choledocholithiasis. EXAM: MRI ABDOMEN WITHOUT CONTRAST  (INCLUDING MRCP) TECHNIQUE: Multiplanar multisequence MR imaging of the abdomen was performed. Heavily T2-weighted images of the biliary and pancreatic ducts were obtained, and three-dimensional MRCP images were rendered by post processing. COMPARISON:  No prior abdominal MRI. CT the abdomen and pelvis 10/25/2019. FINDINGS: Comment: Study is limited by lack of IV gadolinium for detection and characterization of visceral and/or vascular lesions. Lower chest: Unremarkable. Hepatobiliary: No suspicious cystic or solid hepatic lesions are confidently identified on today's noncontrast examination. There is mild intrahepatic biliary ductal dilatation and moderate to severe extrahepatic biliary ductal dilatation with the common bile duct noted to measure up to 21 mm in the porta hepatis on MRCP images. Status post cholecystectomy. In the distal common bile duct immediately before the ampulla there is a small filling defect measuring 3 mm (coronal image 9 of series 15), compatible with a retained ductal stone.  Pancreas: No definite pancreatic mass or peripancreatic fluid collections or inflammatory changes noted on today's noncontrast examination. No pancreatic ductal dilatation noted on MRCP images. Spleen:  Unremarkable. Adrenals/Urinary Tract: In the anterior aspect of the interpolar region of the right kidney there is a 6 mm T1 hyperintense, T2 hypointense lesion which is incompletely characterized on today's noncontrast examination, but statistically  likely to represent a proteinaceous/hemorrhagic cyst. Three T1 hypointense, T2 hyperintense lesions are noted in the left kidney, also not characterized on today's noncontrast examination, but statistically likely to represent cysts, largest of which is in the interpolar region measuring 2.4 cm in diameter. Stomach/Bowel: Visualized portions are unremarkable. Vascular/Lymphatic: No aneurysm identified in the visualized abdominal vasculature. No lymphadenopathy noted in the abdomen. Other: No significant volume of ascites noted in the visualized portions of the peritoneal cavity. Musculoskeletal: No aggressive appearing osseous lesions are noted in the visualized portions of the skeleton. Orthopedic fixation hardware in the lumbar spine incompletely imaged. IMPRESSION: 1. 3 mm filling defect in the distal common bile duct immediately before the ampulla, compatible with retained choledocholithiasis. This is associated with moderate to severe extrahepatic biliary ductal dilatation and mild intrahepatic biliary ductal dilatation. Although some of this dilatation could be related to chronic post cholecystectomy physiology, if there is clinical evidence of biliary tract obstruction, the choledocholithiasis is likely a source of obstruction. 2. Bilateral renal lesions, incompletely characterized on today's noncontrast examination but favored to represent Bosniak class 1 and Bosniak class 2 cysts, as detailed above. These could be definitively characterized with follow-up nonemergent abdominal MRI with and without IV gadolinium if of clinical concern. Electronically Signed   By: Vinnie Langton M.D.   On: 10/25/2019 20:04      Impression / Plan:   Earl Murray is a 64 y.o. y/o male with abdominal pain, CBD dilation, filling defect in the CBD on MRCP  ERCP indicated for further evaluation of the common bile duct for evaluation and removal of stones.  Bile duct dilation may be due to postcholecystectomy status,  however due to distal tapering, patient's abdominal pain, and MRCP findings, ERCP needed for further assessment.   Clear liquid diet okay today  ERCP planned for tomorrow with Dr. Allen Norris, as he is not available for the procedure today.  No evidence of ascending cholangitis, indication for urgent ERCP at this time  N.p.o. past midnight  If clinical status worsens, patient develops fevers, elevated white count or any other questions or concerns, please page GI for further evaluation at that time.  Thank you for involving me in the care of this patient.      LOS: 1 day   Virgel Manifold, MD  10/26/2019, 1:16 PM

## 2019-10-26 NOTE — ED Notes (Signed)
Pt gave verbal permission to update wife.  Called and provided to her.

## 2019-10-26 NOTE — Progress Notes (Signed)
[facility] [place] NAME: Earl Murray    MR#:  [id number]  DATE OF BIRTH:  [birth year]  SUBJECTIVE:   Patient came in with significant abdominal pain. Denies any vomiting. Tells me current dose of pain meds is not helping much. No family in the ER. REVIEW OF SYSTEMS:   Review of Systems  Constitutional: Negative for chills, fever and weight loss.  HENT: Negative for ear discharge, ear pain and nosebleeds.   Eyes: Negative for blurred vision, pain and discharge.  Respiratory: Negative for sputum production, shortness of breath, wheezing and stridor.   Cardiovascular: Negative for chest pain, palpitations, orthopnea and PND.  Gastrointestinal: Positive for abdominal pain. Negative for diarrhea, nausea and vomiting.  Genitourinary: Negative for frequency and urgency.  Musculoskeletal: Negative for back pain and joint pain.  Neurological: Negative for sensory change, speech change, focal weakness and weakness.  Psychiatric/Behavioral: Negative for depression and hallucinations. The patient is not nervous/anxious.    Tolerating Diet:clear Tolerating PT: not needed  DRUG ALLERGIES:   Allergies  Allergen Reactions  . Tylenol With Codeine #3 [Acetaminophen-Codeine] Itching  . Motrin [Ibuprofen] Rash  . Tramadol Rash    VITALS:  Blood pressure (!) 142/77, pulse 61, temperature 98.4 F (36.9 C), temperature source Oral, resp. rate 20, height 6\' 5"  (1.956 m), weight 93.9 kg, SpO2 98 %.  PHYSICAL EXAMINATION:   Physical Exam  GENERAL:  64 y.o.-year-old patient lying in the bed with no acute distress.  EYES: Pupils equal, round, reactive to light and accommodation. No scleral icterus.   HEENT: Head atraumatic, normocephalic. Oropharynx and nasopharynx clear.  NECK:  Supple, no jugular venous distention. No thyroid enlargement, no tenderness.  LUNGS: Normal breath sounds bilaterally, no wheezing, rales, rhonchi. No use of accessory muscles of  respiration.  CARDIOVASCULAR: S1, S2 normal. No murmurs, rubs, or gallops.  ABDOMEN: Soft, right UQ tender +, nondistended. Bowel sounds present. No organomegaly or mass.  EXTREMITIES: No cyanosis, clubbing or edema b/l.    NEUROLOGIC: Cranial nerves II through XII are intact. No focal Motor or sensory deficits b/l.   PSYCHIATRIC:  patient is alert and oriented x 3.  SKIN: No obvious rash, lesion, or ulcer.   LABORATORY PANEL:  CBC Recent Labs  Lab 10/26/19 0449  WBC 10.5  HGB 13.6  HCT 40.0  PLT 273    Chemistries  Recent Labs  Lab 10/26/19 0956  NA 137  K 4.1  CL 101  CO2 27  GLUCOSE 119*  BUN 20  CREATININE 1.17  CALCIUM 9.3  AST 21  ALT 20  ALKPHOS 63  BILITOT 0.8   Cardiac Enzymes No results for input(s): TROPONINI in the last 168 hours. RADIOLOGY:  CT ABDOMEN PELVIS W CONTRAST  Result Date: 10/25/2019 CLINICAL DATA:  Umbilical abdominal pain for 1 day, awoke patient this morning at 0400 hours, history of pancreatitis, suspected intra-abdominal infection/abscess, former smoker, prior cholecystectomy and hernia repairs, hypertension, back surgery EXAM: CT ABDOMEN AND PELVIS WITH CONTRAST TECHNIQUE: Multidetector CT imaging of the abdomen and pelvis was performed using the standard protocol following bolus administration of intravenous contrast. Sagittal and coronal MPR images reconstructed from axial data set. CONTRAST:  14mL OMNIPAQUE IOHEXOL 300 MG/ML SOLN IV. No oral contrast. COMPARISON:  None. FINDINGS: Lower chest: Lung bases clear Hepatobiliary: Gallbladder surgically absent. Tiny nonspecific low-attenuation focus liver image 28. Liver otherwise unremarkable. Pancreas: Dilated CBD at pancreatic head up to 17 mm diameter with distal tapering. No definite mass  identified. Remainder of pancreas normal appearance without surrounding inflammatory changes. Spleen: Normal appearance Adrenals/Urinary Tract: Adrenal glands normal appearance. LEFT renal cysts. Indeterminate  intermediate attenuation RIGHT renal nodule 9 mm diameter. No urinary tract calcification or dilatation. Ureters and bladder unremarkable. Stomach/Bowel: Normal appendix. Colon normal appearance without diverticulosis or diverticulitis. Stomach and small bowel loops normal appearance. Vascular/Lymphatic: Scattered pelvic phleboliths. Atherosclerotic calcifications aorta and iliac arteries without aneurysm. Vascular structures patent. No adenopathy. Reproductive: Unremarkable prostate gland and seminal vesicles Other: No free air or free fluid. No definite inflammatory process or hernia. Musculoskeletal: Prior lumbar fusion L3-S1. No acute osseous findings. IMPRESSION: Dilated CBD up to 17 mm diameter with distal tapering but no definite mass or calculus identified; this is in excess of typical physiologic dilatation seen post cholecystectomy, recommend correlation with LFTs. LEFT renal cysts. Indeterminate 9 mm RIGHT renal nodule; consider follow-up ultrasound characterization. No other intra-abdominal or intrapelvic abnormalities. Aortic Atherosclerosis (ICD10-I70.0). Electronically Signed   By: Lavonia Dana M.D.   On: 10/25/2019 16:40   MR ABDOMEN MRCP WO CONTRAST  Result Date: 10/25/2019 CLINICAL DATA:  64 year old male with history of dilated common bile duct. Evaluated for potential choledocholithiasis. EXAM: MRI ABDOMEN WITHOUT CONTRAST  (INCLUDING MRCP) TECHNIQUE: Multiplanar multisequence MR imaging of the abdomen was performed. Heavily T2-weighted images of the biliary and pancreatic ducts were obtained, and three-dimensional MRCP images were rendered by post processing. COMPARISON:  No prior abdominal MRI. CT the abdomen and pelvis 10/25/2019. FINDINGS: Comment: Study is limited by lack of IV gadolinium for detection and characterization of visceral and/or vascular lesions. Lower chest: Unremarkable. Hepatobiliary: No suspicious cystic or solid hepatic lesions are confidently identified on today's  noncontrast examination. There is mild intrahepatic biliary ductal dilatation and moderate to severe extrahepatic biliary ductal dilatation with the common bile duct noted to measure up to 21 mm in the porta hepatis on MRCP images. Status post cholecystectomy. In the distal common bile duct immediately before the ampulla there is a small filling defect measuring 3 mm (coronal image 9 of series 15), compatible with a retained ductal stone. Pancreas: No definite pancreatic mass or peripancreatic fluid collections or inflammatory changes noted on today's noncontrast examination. No pancreatic ductal dilatation noted on MRCP images. Spleen:  Unremarkable. Adrenals/Urinary Tract: In the anterior aspect of the interpolar region of the right kidney there is a 6 mm T1 hyperintense, T2 hypointense lesion which is incompletely characterized on today's noncontrast examination, but statistically likely to represent a proteinaceous/hemorrhagic cyst. Three T1 hypointense, T2 hyperintense lesions are noted in the left kidney, also not characterized on today's noncontrast examination, but statistically likely to represent cysts, largest of which is in the interpolar region measuring 2.4 cm in diameter. Stomach/Bowel: Visualized portions are unremarkable. Vascular/Lymphatic: No aneurysm identified in the visualized abdominal vasculature. No lymphadenopathy noted in the abdomen. Other: No significant volume of ascites noted in the visualized portions of the peritoneal cavity. Musculoskeletal: No aggressive appearing osseous lesions are noted in the visualized portions of the skeleton. Orthopedic fixation hardware in the lumbar spine incompletely imaged. IMPRESSION: 1. 3 mm filling defect in the distal common bile duct immediately before the ampulla, compatible with retained choledocholithiasis. This is associated with moderate to severe extrahepatic biliary ductal dilatation and mild intrahepatic biliary ductal dilatation. Although  some of this dilatation could be related to chronic post cholecystectomy physiology, if there is clinical evidence of biliary tract obstruction, the choledocholithiasis is likely a source of obstruction. 2. Bilateral renal lesions, incompletely characterized on today's noncontrast examination  but favored to represent Bosniak class 1 and Bosniak class 2 cysts, as detailed above. These could be definitively characterized with follow-up nonemergent abdominal MRI with and without IV gadolinium if of clinical concern. Electronically Signed   By: Vinnie Langton M.D.   On: 10/25/2019 20:04   MR 3D Recon At Scanner  Result Date: 10/25/2019 CLINICAL DATA:  64 year old male with history of dilated common bile duct. Evaluated for potential choledocholithiasis. EXAM: MRI ABDOMEN WITHOUT CONTRAST  (INCLUDING MRCP) TECHNIQUE: Multiplanar multisequence MR imaging of the abdomen was performed. Heavily T2-weighted images of the biliary and pancreatic ducts were obtained, and three-dimensional MRCP images were rendered by post processing. COMPARISON:  No prior abdominal MRI. CT the abdomen and pelvis 10/25/2019. FINDINGS: Comment: Study is limited by lack of IV gadolinium for detection and characterization of visceral and/or vascular lesions. Lower chest: Unremarkable. Hepatobiliary: No suspicious cystic or solid hepatic lesions are confidently identified on today's noncontrast examination. There is mild intrahepatic biliary ductal dilatation and moderate to severe extrahepatic biliary ductal dilatation with the common bile duct noted to measure up to 21 mm in the porta hepatis on MRCP images. Status post cholecystectomy. In the distal common bile duct immediately before the ampulla there is a small filling defect measuring 3 mm (coronal image 9 of series 15), compatible with a retained ductal stone. Pancreas: No definite pancreatic mass or peripancreatic fluid collections or inflammatory changes noted on today's noncontrast  examination. No pancreatic ductal dilatation noted on MRCP images. Spleen:  Unremarkable. Adrenals/Urinary Tract: In the anterior aspect of the interpolar region of the right kidney there is a 6 mm T1 hyperintense, T2 hypointense lesion which is incompletely characterized on today's noncontrast examination, but statistically likely to represent a proteinaceous/hemorrhagic cyst. Three T1 hypointense, T2 hyperintense lesions are noted in the left kidney, also not characterized on today's noncontrast examination, but statistically likely to represent cysts, largest of which is in the interpolar region measuring 2.4 cm in diameter. Stomach/Bowel: Visualized portions are unremarkable. Vascular/Lymphatic: No aneurysm identified in the visualized abdominal vasculature. No lymphadenopathy noted in the abdomen. Other: No significant volume of ascites noted in the visualized portions of the peritoneal cavity. Musculoskeletal: No aggressive appearing osseous lesions are noted in the visualized portions of the skeleton. Orthopedic fixation hardware in the lumbar spine incompletely imaged. IMPRESSION: 1. 3 mm filling defect in the distal common bile duct immediately before the ampulla, compatible with retained choledocholithiasis. This is associated with moderate to severe extrahepatic biliary ductal dilatation and mild intrahepatic biliary ductal dilatation. Although some of this dilatation could be related to chronic post cholecystectomy physiology, if there is clinical evidence of biliary tract obstruction, the choledocholithiasis is likely a source of obstruction. 2. Bilateral renal lesions, incompletely characterized on today's noncontrast examination but favored to represent Bosniak class 1 and Bosniak class 2 cysts, as detailed above. These could be definitively characterized with follow-up nonemergent abdominal MRI with and without IV gadolinium if of clinical concern. Electronically Signed   By: Vinnie Langton M.D.    On: 10/25/2019 20:04   ASSESSMENT AND PLAN:  Earl Murray is a 64 y.o. male with medical history significant for hypertension who presents to the emergency room for evaluation of periumbilical pain which he has had for over a month but which got worse on the day of admission and has been persistent prompting an emergency room visit.  Pain is mostly in the epigastrium/periumbilical area.  Abdominal pain with abnormal MRCP showing CBD dilatation and feeling defect in the  CBD. Possible choledocholithiasis -CT scan of the abdomen and pelvis showed a dilated common bile duct and -MRCP was obtained which showed filling defect in CDB -LFT's normal -Patient has a white count of 12,000 and a low-grade fever -empirically on on IV antibiotic therapy with Zosyn adjusted to renal function -GI consult Dr. Bonna Gains appreciated.-- Dr. Maryelizabeth Kaufmann.I. to see tomorrow for possible ERCP Keep patient NPO after midnight  Hypertension Will place patient on Hydralazine 10mg  IV q 6 for SBP > 165mmHg -continue hydrochlorothiazide and amlodipine  DVT prophylaxis: SCD Code Status: Full Family Communication: no family in ER Disposition Plan: Back to previous home environment Consults called: GI   Status is: Inpatient  Remains inpatient appropriate because:Ongoing active pain requiring inpatient pain management and Unsafe d/c plan   Dispo: The patient is from: Home              Anticipated d/c is to: Home              Anticipated d/c date is: 1 day              Patient currently is not medically stable to d/c.        TOTAL TIME TAKING CARE OF THIS PATIENT: 35 minutes.  >50% time spent on counselling and coordination of care  Note: This dictation was prepared with Dragon dictation along with smaller phrase technology. Any transcriptional errors that result from this process are unintentional.  Fritzi Mandes M.D    [facility]   CC: Primary care physician; Patient, No Pcp PerPatient ID: Cleone Slim,  male   DOB: 08/19/1955, 64 y.o.   MRN: [id number]

## 2019-10-27 ENCOUNTER — Encounter: Payer: Self-pay | Admitting: Internal Medicine

## 2019-10-27 ENCOUNTER — Inpatient Hospital Stay: Payer: Medicare Other | Admitting: Anesthesiology

## 2019-10-27 ENCOUNTER — Encounter
Admission: EM | Disposition: A | Discharge status: Home / Self Care | Payer: Self-pay | Source: Home / Self Care | Attending: Internal Medicine

## 2019-10-27 ENCOUNTER — Inpatient Hospital Stay: Payer: Medicare Other

## 2019-10-27 DIAGNOSIS — R932 Abnormal findings on diagnostic imaging of liver and biliary tract: Secondary | ICD-10-CM

## 2019-10-27 HISTORY — PX: ERCP: SHX5425

## 2019-10-27 LAB — CREATININE, SERUM
Creatinine, Ser: 1.33 mg/dL — ABNORMAL HIGH (ref 0.61–1.24)
GFR calc Af Amer: >60 mL/min (ref >60)
GFR calc non Af Amer: 56 mL/min — ABNORMAL LOW (ref >60)

## 2019-10-27 SURGERY — ERCP, WITH INTERVENTION IF INDICATED
Anesthesia: General

## 2019-10-27 MED ORDER — PROPOFOL 10 MG/ML IV BOLUS
INTRAVENOUS | Status: DC | PRN
  Administered 2019-10-27: 80 mg via INTRAVENOUS

## 2019-10-27 MED ORDER — DEXMEDETOMIDINE HCL 200 MCG/2ML IV SOLN
INTRAVENOUS | Status: DC | PRN
  Administered 2019-10-27: 20 ug via INTRAVENOUS

## 2019-10-27 MED ORDER — DIPHENHYDRAMINE HCL 50 MG/ML IJ SOLN
INTRAMUSCULAR | Status: DC | PRN
  Administered 2019-10-27: 25 mg via INTRAVENOUS

## 2019-10-27 MED ORDER — DIPHENHYDRAMINE HCL 50 MG/ML IJ SOLN
INTRAMUSCULAR | Status: AC
  Filled 2019-10-27: qty 1

## 2019-10-27 MED ORDER — LACTATED RINGERS IV SOLN
INTRAVENOUS | Status: DC | PRN
Start: 2019-10-27 — End: 2019-10-27

## 2019-10-27 MED ORDER — HYDROMORPHONE HCL 1 MG/ML IJ SOLN
1.0000 mg | Freq: Three times a day (TID) | INTRAMUSCULAR | Status: DC | PRN
  Administered 2019-10-27 – 2019-10-28 (×2): 1 mg via INTRAVENOUS
  Filled 2019-10-27 (×2): qty 1

## 2019-10-27 MED ORDER — LIDOCAINE HCL (CARDIAC) PF 100 MG/5ML IV SOSY
PREFILLED_SYRINGE | INTRAVENOUS | Status: DC | PRN
  Administered 2019-10-27: 100 mg via INTRAVENOUS

## 2019-10-27 MED ORDER — INDOMETHACIN 50 MG RE SUPP
100.0000 mg | Freq: Once | RECTAL | Status: AC
  Administered 2019-10-27: 100 mg via RECTAL

## 2019-10-27 MED ORDER — GLYCOPYRROLATE 0.2 MG/ML IJ SOLN
INTRAMUSCULAR | Status: AC
  Filled 2019-10-27: qty 1

## 2019-10-27 MED ORDER — INDOMETHACIN 50 MG RE SUPP
RECTAL | Status: AC
  Filled 2019-10-27: qty 2

## 2019-10-27 MED ORDER — OXYCODONE HCL 5 MG PO TABS
ORAL_TABLET | ORAL | Status: AC
  Administered 2019-10-27: 5 mg via ORAL
  Filled 2019-10-27: qty 1

## 2019-10-27 MED ORDER — PROPOFOL 500 MG/50ML IV EMUL
INTRAVENOUS | Status: DC | PRN
  Administered 2019-10-27: 175 ug/kg/min via INTRAVENOUS

## 2019-10-27 MED ORDER — OXYCODONE HCL 5 MG/5ML PO SOLN: 5.0000 mg | Freq: Once | ORAL | Status: DC

## 2019-10-27 NOTE — Progress Notes (Signed)
Pharmacy Antibiotic Note  Jamarri Vuncannon is a 64 y.o. male admitted on 10/25/2019 with intra-abdominal source w/ dilated CBD.  Pharmacy has been consulted for zosyn dosing.  Plan: Antibiotics Day 2. Will continue Zosyn 3.375g IV q8h (4 hour infusion).  Height: 6\' 5"  (195.6 cm) Weight: 93.9 kg (207 lb) IBW/kg (Calculated) : 89.1  Temp (24hrs), Avg:97.9 F (36.6 C), Min:97.5 F (36.4 C), Max:98.4 F (36.9 C)  Recent Labs  Lab 10/25/19 1314 10/26/19 0449 10/26/19 0956 10/27/19 0640  WBC 12.0* 10.5  --   --   CREATININE 1.23 1.21 1.17 1.33*    Estimated Creatinine Clearance: 69.8 mL/min (A) (by C-G formula based on SCr of 1.33 mg/dL (H)).    Allergies  Allergen Reactions  . Tylenol With Codeine #3 [Acetaminophen-Codeine] Itching  . Motrin [Ibuprofen] Rash  . Tramadol Rash   Thank you for allowing pharmacy to be a part of this patient's care.  Pearla Dubonnet, PharmD Clinical Pharmacist 10/27/2019 9:44 AM

## 2019-10-27 NOTE — Care Management Important Message (Addendum)
Important Message  Patient Details  Name: Earl Murray MRN: [id number] Date of Birth: [birth year]   Medicare Important Message Given:  Yes  Initial Medicare IM given by Patient Access Associate on 10/26/2019 at 11:28am.     Dannette Barbara 10/27/2019, 8:41 AM

## 2019-10-27 NOTE — Transfer of Care (Signed)
Immediate Anesthesia Transfer of Care Note  Patient: Earl Murray  Procedure(s) Performed: ENDOSCOPIC RETROGRADE CHOLANGIOPANCREATOGRAPHY (ERCP) (N/A )  Patient Location: Endoscopy Unit  Anesthesia Type:General  Level of Consciousness: drowsy  Airway & Oxygen Therapy: Patient Spontanous Breathing  Post-op Assessment: Report given to RN and Post -op Vital signs reviewed and stable  Post vital signs: Reviewed and stable  Last Vitals:  Vitals Value Taken Time  BP    Temp    Pulse    Resp    SpO2      Last Pain:  Vitals:   10/27/19 1130  TempSrc: Oral  PainSc: 7       Patients Stated Pain Goal: 0 (62/83/15 1761)  Complications: No apparent anesthesia complications

## 2019-10-27 NOTE — Anesthesia Postprocedure Evaluation (Signed)
Anesthesia Post Note  Patient: Earl Murray  Procedure(s) Performed: ENDOSCOPIC RETROGRADE CHOLANGIOPANCREATOGRAPHY (ERCP) (N/A )  Patient location during evaluation: Endoscopy Anesthesia Type: General Level of consciousness: awake and alert Pain management: pain level controlled Vital Signs Assessment: post-procedure vital signs reviewed and stable Respiratory status: spontaneous breathing, nonlabored ventilation, respiratory function stable and patient connected to nasal cannula oxygen Cardiovascular status: blood pressure returned to baseline and stable Postop Assessment: no apparent nausea or vomiting Anesthetic complications: no     Last Vitals:  Vitals:   10/27/19 1349 10/27/19 1359  BP: (!) 145/86 132/81  Pulse: (!) 53 (!) 55  Resp: 10 (!) 8  Temp:    SpO2: 97% 98%    Last Pain:  Vitals:   10/27/19 1359  TempSrc:   PainSc: 8                  Neeraj Housand K Draydon [place]

## 2019-10-27 NOTE — Anesthesia Preprocedure Evaluation (Signed)
Anesthesia Evaluation  Patient identified by MRN, date of birth, ID band Patient awake    Reviewed: Allergy & Precautions, H&P , NPO status , Patient's Chart, lab work & pertinent test results  History of Anesthesia Complications Negative for: history of anesthetic complications  Airway Mallampati: III  TM Distance: >3 FB Neck ROM: full    Dental  (+) Chipped, Poor Dentition   Pulmonary sleep apnea , former smoker,    Pulmonary exam normal        Cardiovascular Exercise Tolerance: Good hypertension, (-) angina(-) Past MI and (-) DOE Normal cardiovascular exam     Neuro/Psych PSYCHIATRIC DISORDERS negative neurological ROS     GI/Hepatic negative GI ROS, Neg liver ROS, neg GERD  ,  Endo/Other  negative endocrine ROS  Renal/GU negative Renal ROS  negative genitourinary   Musculoskeletal   Abdominal   Peds  Hematology negative hematology ROS (+)   Anesthesia Other Findings Past Medical History: No date: Hypertension No date: Neck injury  Past Surgical History: No date: BACK SURGERY No date: ELBOW SURGERY No date: FOOT SURGERY No date: HERNIA REPAIR     Comment:  4 hernia repairs  BMI    Body Mass Index: 24.55 kg/m      Reproductive/Obstetrics negative OB ROS                             Anesthesia Physical Anesthesia Plan  ASA: III  Anesthesia Plan: General   Post-op Pain Management:    Induction: Intravenous  PONV Risk Score and Plan: Propofol infusion and TIVA  Airway Management Planned: Natural Airway and Nasal Cannula  Additional Equipment:   Intra-op Plan:   Post-operative Plan:   Informed Consent: I have reviewed the patients History and Physical, chart, labs and discussed the procedure including the risks, benefits and alternatives for the proposed anesthesia with the patient or authorized representative who has indicated his/her understanding and acceptance.      Dental Advisory Given  Plan Discussed with: Anesthesiologist, CRNA and Surgeon  Anesthesia Plan Comments: (Patient consented for risks of anesthesia including but not limited to:  - adverse reactions to medications - risk of intubation if required - damage to eyes, teeth, lips or other oral mucosa - nerve damage due to positioning  - sore throat or hoarseness - Damage to heart, brain, nerves, lungs, other parts of body or loss of life  Patient voiced understanding.)        Anesthesia Quick Evaluation

## 2019-10-27 NOTE — Op Note (Signed)
[facility] Patient Name: Earl Murray Procedure Date: 10/27/2019 11:51 AM MRN: [id number] Account #: [id number] Date of Birth: 04-13-1956 Admit Type: Inpatient Age: 64 Room: [facility] ENDO ROOM 4 Gender: Male Note Status: Finalized Procedure:             ERCP Indications:           Abnormal MRCP Providers:             Lucilla Lame MD, MD Medicines:             Propofol per Anesthesia Complications:         No immediate complications. Procedure:             Pre-Anesthesia Assessment:                        - Prior to the procedure, a History and Physical was                         performed, and patient medications and allergies were                         reviewed. The patient's tolerance of previous                         anesthesia was also reviewed. The risks and benefits                         of the procedure and the sedation options and risks                         were discussed with the patient. All questions were                         answered, and informed consent was obtained. Prior                         Anticoagulants: The patient has taken no previous                         anticoagulant or antiplatelet agents. ASA Grade                         Assessment: II - A patient with mild systemic disease.                         After reviewing the risks and benefits, the patient                         was deemed in satisfactory condition to undergo the                         procedure.                        After obtaining informed consent, the scope was passed                         under direct vision. Throughout the procedure, the  patient's blood pressure, pulse, and oxygen                         saturations were monitored continuously. The was                         introduced through the mouth, and used to inject                         contrast into and used to inject contrast into the         bile duct. The ERCP was accomplished without                         difficulty. The patient tolerated the procedure well. Findings:      A scout film of the abdomen was obtained. Surgical clips, consistent       with a previous cholecystectomy, were seen in the area of the right       upper quadrant of the abdomen. A biliary sphincterotomy had been       performed. The sphincterotomy appeared open. The bile duct was deeply       cannulated with the short-nosed traction sphincterotome. Contrast was       injected. I personally interpreted the bile duct images. There was brisk       flow of contrast through the ducts. Image quality was excellent.       Contrast extended to the entire biliary tree. The main bile duct was       moderately dilated and diffusely dilated, acquired. A wire was passed       into the biliary tree. The biliary sphincterotomy was extended with a       traction (standard) sphincterotome using ERBE electrocautery. There was       no post-sphincterotomy bleeding. The biliary tree was swept with a 15 mm       balloon starting at the bifurcation. Nothing was found. Impression:            - Prior biliary sphincterotomy appeared open.                        - The entire main bile duct was moderately dilated,                         acquired.                        - A biliary sphincterotomy was performed.                        - The biliary tree was swept and nothing was found. Recommendation:        - Return patient to hospital ward for ongoing care.                        - Clear liquid diet.                        - Watch for pancreatitis, bleeding, perforation, and                         cholangitis. Procedure Code(s):     ---  Professional ---                        [phone], Endoscopic retrograde cholangiopancreatography                         (ERCP); with sphincterotomy/papillotomy                        [phone], Endoscopic catheterization of the biliary                          ductal system, radiological supervision and                         interpretation Diagnosis Code(s):     --- Professional ---                        R93.2, Abnormal findings on diagnostic imaging of                         liver and biliary tract                        K83.8, Other specified diseases of biliary tract CPT copyright 2019 American [employer] Association. All rights reserved. The codes documented in this report are preliminary and upon coder review may  be revised to meet current compliance requirements. Lucilla Lame MD, MD 10/27/2019 12:55:16 PM This report has been signed electronically. Number of Addenda: 0 Note Initiated On: 10/27/2019 11:51 AM Estimated Blood Loss:  Estimated blood loss: none.      [facility]

## 2019-10-27 NOTE — Evaluation (Signed)
Recovery room Patient complains of mid abdominal pain on a scale of 8.Vital signs stable.Anesthesia notified and oxycodone 5 mg. Is ordered.Since patient had an allergy to tylenol with codeine I called pharmacy to make sure this would be ok for patient to take.Pharmacy said it would be ok for patient to use.Oxycodone 5 mgs.was given at 14:00.Vital signs remained stable so patient is returned to his room on the floor.

## 2019-10-27 NOTE — Progress Notes (Signed)
[facility] [place] NAME: Earl Murray    MR#:  [id number]  DATE OF BIRTH:  [birth year]  SUBJECTIVE:   Patient came in with significant abdominal pain. Denies any vomiting.  Back pain+ REVIEW OF SYSTEMS:   Review of Systems  Constitutional: Negative for chills, fever and weight loss.  HENT: Negative for ear discharge, ear pain and nosebleeds.   Eyes: Negative for blurred vision, pain and discharge.  Respiratory: Negative for sputum production, shortness of breath, wheezing and stridor.   Cardiovascular: Negative for chest pain, palpitations, orthopnea and PND.  Gastrointestinal: Positive for abdominal pain. Negative for diarrhea, nausea and vomiting.  Genitourinary: Negative for frequency and urgency.  Musculoskeletal: Negative for back pain and joint pain.  Neurological: Negative for sensory change, speech change, focal weakness and weakness.  Psychiatric/Behavioral: Negative for depression and hallucinations. The patient is not nervous/anxious.    Tolerating Diet:clear Tolerating PT: not needed  DRUG ALLERGIES:   Allergies  Allergen Reactions  . Tylenol With Codeine #3 [Acetaminophen-Codeine] Itching  . Motrin [Ibuprofen] Rash  . Tramadol Rash    VITALS:  Blood pressure (!) 159/83, pulse (!) 57, temperature 97.7 F (36.5 C), temperature source Oral, resp. rate 20, height 6\' 5"  (1.956 m), weight 93.9 kg, SpO2 99 %.  PHYSICAL EXAMINATION:   Physical Exam  GENERAL:  64 y.o.-year-old patient lying in the bed with no acute distress.  EYES: Pupils equal, round, reactive to light and accommodation. No scleral icterus.   HEENT: Head atraumatic, normocephalic. Oropharynx and nasopharynx clear.  NECK:  Supple, no jugular venous distention. No thyroid enlargement, no tenderness.  LUNGS: Normal breath sounds bilaterally, no wheezing, rales, rhonchi. No use of accessory muscles of respiration.  CARDIOVASCULAR: S1, S2 normal. No murmurs,  rubs, or gallops.  ABDOMEN: Soft, right UQ tender +, nondistended. Bowel sounds present. No organomegaly or mass.  EXTREMITIES: No cyanosis, clubbing or edema b/l.    NEUROLOGIC: Cranial nerves II through XII are intact. No focal Motor or sensory deficits b/l.   PSYCHIATRIC:  patient is alert and oriented x 3.  SKIN: No obvious rash, lesion, or ulcer.   LABORATORY PANEL:  CBC Recent Labs  Lab 10/26/19 0449  WBC 10.5  HGB 13.6  HCT 40.0  PLT 273    Chemistries  Recent Labs  Lab 10/26/19 0956 10/26/19 0956 10/27/19 0640  NA 137  --   --   K 4.1  --   --   CL 101  --   --   CO2 27  --   --   GLUCOSE 119*  --   --   BUN 20  --   --   CREATININE 1.17   < > 1.33*  CALCIUM 9.3  --   --   AST 21  --   --   ALT 20  --   --   ALKPHOS 63  --   --   BILITOT 0.8  --   --    < > = values in this interval not displayed.   Cardiac Enzymes No results for input(s): TROPONINI in the last 168 hours. RADIOLOGY:  CT ABDOMEN PELVIS W CONTRAST  Result Date: 10/25/2019 CLINICAL DATA:  Umbilical abdominal pain for 1 day, awoke patient this morning at 0400 hours, history of pancreatitis, suspected intra-abdominal infection/abscess, former smoker, prior cholecystectomy and hernia repairs, hypertension, back surgery EXAM: CT ABDOMEN AND PELVIS WITH CONTRAST TECHNIQUE: Multidetector CT imaging of the abdomen and pelvis  was performed using the standard protocol following bolus administration of intravenous contrast. Sagittal and coronal MPR images reconstructed from axial data set. CONTRAST:  119mL OMNIPAQUE IOHEXOL 300 MG/ML SOLN IV. No oral contrast. COMPARISON:  None. FINDINGS: Lower chest: Lung bases clear Hepatobiliary: Gallbladder surgically absent. Tiny nonspecific low-attenuation focus liver image 28. Liver otherwise unremarkable. Pancreas: Dilated CBD at pancreatic head up to 17 mm diameter with distal tapering. No definite mass identified. Remainder of pancreas normal appearance without  surrounding inflammatory changes. Spleen: Normal appearance Adrenals/Urinary Tract: Adrenal glands normal appearance. LEFT renal cysts. Indeterminate intermediate attenuation RIGHT renal nodule 9 mm diameter. No urinary tract calcification or dilatation. Ureters and bladder unremarkable. Stomach/Bowel: Normal appendix. Colon normal appearance without diverticulosis or diverticulitis. Stomach and small bowel loops normal appearance. Vascular/Lymphatic: Scattered pelvic phleboliths. Atherosclerotic calcifications aorta and iliac arteries without aneurysm. Vascular structures patent. No adenopathy. Reproductive: Unremarkable prostate gland and seminal vesicles Other: No free air or free fluid. No definite inflammatory process or hernia. Musculoskeletal: Prior lumbar fusion L3-S1. No acute osseous findings. IMPRESSION: Dilated CBD up to 17 mm diameter with distal tapering but no definite mass or calculus identified; this is in excess of typical physiologic dilatation seen post cholecystectomy, recommend correlation with LFTs. LEFT renal cysts. Indeterminate 9 mm RIGHT renal nodule; consider follow-up ultrasound characterization. No other intra-abdominal or intrapelvic abnormalities. Aortic Atherosclerosis (ICD10-I70.0). Electronically Signed   By: Lavonia Dana M.D.   On: 10/25/2019 16:40   MR ABDOMEN MRCP WO CONTRAST  Result Date: 10/25/2019 CLINICAL DATA:  64 year old male with history of dilated common bile duct. Evaluated for potential choledocholithiasis. EXAM: MRI ABDOMEN WITHOUT CONTRAST  (INCLUDING MRCP) TECHNIQUE: Multiplanar multisequence MR imaging of the abdomen was performed. Heavily T2-weighted images of the biliary and pancreatic ducts were obtained, and three-dimensional MRCP images were rendered by post processing. COMPARISON:  No prior abdominal MRI. CT the abdomen and pelvis 10/25/2019. FINDINGS: Comment: Study is limited by lack of IV gadolinium for detection and characterization of visceral and/or  vascular lesions. Lower chest: Unremarkable. Hepatobiliary: No suspicious cystic or solid hepatic lesions are confidently identified on today's noncontrast examination. There is mild intrahepatic biliary ductal dilatation and moderate to severe extrahepatic biliary ductal dilatation with the common bile duct noted to measure up to 21 mm in the porta hepatis on MRCP images. Status post cholecystectomy. In the distal common bile duct immediately before the ampulla there is a small filling defect measuring 3 mm (coronal image 9 of series 15), compatible with a retained ductal stone. Pancreas: No definite pancreatic mass or peripancreatic fluid collections or inflammatory changes noted on today's noncontrast examination. No pancreatic ductal dilatation noted on MRCP images. Spleen:  Unremarkable. Adrenals/Urinary Tract: In the anterior aspect of the interpolar region of the right kidney there is a 6 mm T1 hyperintense, T2 hypointense lesion which is incompletely characterized on today's noncontrast examination, but statistically likely to represent a proteinaceous/hemorrhagic cyst. Three T1 hypointense, T2 hyperintense lesions are noted in the left kidney, also not characterized on today's noncontrast examination, but statistically likely to represent cysts, largest of which is in the interpolar region measuring 2.4 cm in diameter. Stomach/Bowel: Visualized portions are unremarkable. Vascular/Lymphatic: No aneurysm identified in the visualized abdominal vasculature. No lymphadenopathy noted in the abdomen. Other: No significant volume of ascites noted in the visualized portions of the peritoneal cavity. Musculoskeletal: No aggressive appearing osseous lesions are noted in the visualized portions of the skeleton. Orthopedic fixation hardware in the lumbar spine incompletely imaged. IMPRESSION: 1. 3 mm  filling defect in the distal common bile duct immediately before the ampulla, compatible with retained  choledocholithiasis. This is associated with moderate to severe extrahepatic biliary ductal dilatation and mild intrahepatic biliary ductal dilatation. Although some of this dilatation could be related to chronic post cholecystectomy physiology, if there is clinical evidence of biliary tract obstruction, the choledocholithiasis is likely a source of obstruction. 2. Bilateral renal lesions, incompletely characterized on today's noncontrast examination but favored to represent Bosniak class 1 and Bosniak class 2 cysts, as detailed above. These could be definitively characterized with follow-up nonemergent abdominal MRI with and without IV gadolinium if of clinical concern. Electronically Signed   By: Vinnie Langton M.D.   On: 10/25/2019 20:04   MR 3D Recon At Scanner  Result Date: 10/25/2019 CLINICAL DATA:  64 year old male with history of dilated common bile duct. Evaluated for potential choledocholithiasis. EXAM: MRI ABDOMEN WITHOUT CONTRAST  (INCLUDING MRCP) TECHNIQUE: Multiplanar multisequence MR imaging of the abdomen was performed. Heavily T2-weighted images of the biliary and pancreatic ducts were obtained, and three-dimensional MRCP images were rendered by post processing. COMPARISON:  No prior abdominal MRI. CT the abdomen and pelvis 10/25/2019. FINDINGS: Comment: Study is limited by lack of IV gadolinium for detection and characterization of visceral and/or vascular lesions. Lower chest: Unremarkable. Hepatobiliary: No suspicious cystic or solid hepatic lesions are confidently identified on today's noncontrast examination. There is mild intrahepatic biliary ductal dilatation and moderate to severe extrahepatic biliary ductal dilatation with the common bile duct noted to measure up to 21 mm in the porta hepatis on MRCP images. Status post cholecystectomy. In the distal common bile duct immediately before the ampulla there is a small filling defect measuring 3 mm (coronal image 9 of series 15), compatible  with a retained ductal stone. Pancreas: No definite pancreatic mass or peripancreatic fluid collections or inflammatory changes noted on today's noncontrast examination. No pancreatic ductal dilatation noted on MRCP images. Spleen:  Unremarkable. Adrenals/Urinary Tract: In the anterior aspect of the interpolar region of the right kidney there is a 6 mm T1 hyperintense, T2 hypointense lesion which is incompletely characterized on today's noncontrast examination, but statistically likely to represent a proteinaceous/hemorrhagic cyst. Three T1 hypointense, T2 hyperintense lesions are noted in the left kidney, also not characterized on today's noncontrast examination, but statistically likely to represent cysts, largest of which is in the interpolar region measuring 2.4 cm in diameter. Stomach/Bowel: Visualized portions are unremarkable. Vascular/Lymphatic: No aneurysm identified in the visualized abdominal vasculature. No lymphadenopathy noted in the abdomen. Other: No significant volume of ascites noted in the visualized portions of the peritoneal cavity. Musculoskeletal: No aggressive appearing osseous lesions are noted in the visualized portions of the skeleton. Orthopedic fixation hardware in the lumbar spine incompletely imaged. IMPRESSION: 1. 3 mm filling defect in the distal common bile duct immediately before the ampulla, compatible with retained choledocholithiasis. This is associated with moderate to severe extrahepatic biliary ductal dilatation and mild intrahepatic biliary ductal dilatation. Although some of this dilatation could be related to chronic post cholecystectomy physiology, if there is clinical evidence of biliary tract obstruction, the choledocholithiasis is likely a source of obstruction. 2. Bilateral renal lesions, incompletely characterized on today's noncontrast examination but favored to represent Bosniak class 1 and Bosniak class 2 cysts, as detailed above. These could be definitively  characterized with follow-up nonemergent abdominal MRI with and without IV gadolinium if of clinical concern. Electronically Signed   By: Vinnie Langton M.D.   On: 10/25/2019 20:04   DG C-Arm  1-60 Min-No Report  Result Date: 10/27/2019 Fluoroscopy was utilized by the requesting physician.  No radiographic interpretation.   ASSESSMENT AND PLAN:  Earl Murray is a 64 y.o. male with medical history significant for hypertension who presents to the emergency room for evaluation of periumbilical pain which he has had for over a month but which got worse on the day of admission and has been persistent prompting an emergency room visit.  Pain is mostly in the epigastrium/periumbilical area.  Abdominal pain with abnormal MRCP showing CBD dilatation and feeling defect in the CBD. -CT scan of the abdomen and pelvis showed a dilated common bile duct and -MRCP was obtained which showed filling defect in CDB -LFT's normal -Patient has a white count of 12,000 and a low-grade fever -empirically on on IV antibiotic therapy with Zosyn adjusted to renal function--discontinued now -GI consult Dr. Bonna Gains appreciated.--  -6/4>s/p ERCP mild dilated CBD--nothing found -CLD Check lipase in am  Hypertension - on Hydralazine 10mg  IV q 6 for SBP > 134mmHg -continue hydrochlorothiazide and amlodipine  DVT prophylaxis: SCD Code Status: Full Family Communication: no family in ER Disposition Plan: Back to previous home environment Consults called: GI   Status is: Inpatient  Remains inpatient appropriate because:need to monitor lipase post ERCP   Dispo: The patient is from: Home              Anticipated d/c is to: Home              Anticipated d/c date is: 1 day              Patient currently is not medically stable to d/c. need to monitor lipase in am post ERCP   TOTAL TIME TAKING CARE OF THIS PATIENT: 25 minutes.  >50% time spent on counselling and coordination of care  Note: This dictation was  prepared with Dragon dictation along with smaller phrase technology. Any transcriptional errors that result from this process are unintentional.  Fritzi Mandes M.D    [facility]   CC: Primary care physician; Patient, No Pcp PerPatient ID: Earl Murray, male   DOB: January 13, 1956, 64 y.o.   MRN: [id number]

## 2019-10-28 DIAGNOSIS — K8050 Calculus of bile duct without cholangitis or cholecystitis without obstruction: Principal | ICD-10-CM

## 2019-10-28 LAB — LIPASE, BLOOD: Lipase: 23 U/L (ref 11–51)

## 2019-10-28 NOTE — Progress Notes (Signed)
Earl Murray , MD [address] [place], [place], [place], [address] [address], [place], [place], [place], [address] Phone: [phone]  Fax: [phone]   Earl Murray is being followed for choledocholithiasis  Day 1 of follow up   Subjective: Doing well , tolerating oral intake , no complaints    Objective: Vital signs in last 24 hours: Vitals:   10/27/19 1523 10/27/19 2046 10/27/19 2359 10/28/19 0512  BP: 136/77 125/67 (!) 148/90 124/82  Pulse: (!) 54 63 62 60  Resp: 11   13  Temp:  98.6 F (37 C) 97.9 F (36.6 C) 98 F (36.7 C)  TempSrc:  Oral Oral Oral  SpO2: 99% 97% 99% 98%  Weight:      Height:       Weight change:   Intake/Output Summary (Last 24 hours) at 10/28/2019 8185 Last data filed at 10/28/2019 6314 Gross per 24 hour  Intake 500 ml  Output 1800 ml  Net -1300 ml     Exam: Heart:: Regular rate and rhythm, S1S2 present or without murmur or extra heart sounds Lungs: normal, clear to auscultation and clear to auscultation and percussion Abdomen: soft, nontender, normal bowel sounds   Lab Results: @LABTEST2 @ Micro Results: Recent Results (from the past 240 hour(s))  SARS Coronavirus 2 by RT PCR (hospital order, performed in [place] hospital lab) Nasopharyngeal Nasopharyngeal Swab     Status: None   Collection Time: 10/25/19  8:25 PM   Specimen: Nasopharyngeal Swab  Result Value Ref Range Status   SARS Coronavirus 2 NEGATIVE NEGATIVE Final    Comment: (NOTE) SARS-CoV-2 target nucleic acids are NOT DETECTED. The SARS-CoV-2 RNA is generally detectable in upper and lower respiratory specimens during the acute phase of infection. The lowest concentration of SARS-CoV-2 viral copies this assay can detect is 250 copies / mL. A negative result does not preclude SARS-CoV-2 infection and should not be used as the sole basis for treatment or other patient management decisions.  A negative result may occur with improper specimen collection /  handling, submission of specimen other than nasopharyngeal swab, presence of viral mutation(s) within the areas targeted by this assay, and inadequate number of viral copies (<250 copies / mL). A negative result must be combined with clinical observations, patient history, and epidemiological information. Fact Sheet for Patients:   StrictlyIdeas.no Fact Sheet for Healthcare Providers: BankingDealers.co.za This test is not yet approved or cleared  by the [place] FDA and has been authorized for detection and/or diagnosis of SARS-CoV-2 by FDA under an Emergency Use Authorization (EUA).  This EUA will remain in effect (meaning this test can be used) for the duration of the COVID-19 declaration under Section 564(b)(1) of the Act, 21 U.S.C. section 360bbb-3(b)(1), unless the authorization is terminated or revoked sooner. Performed at [facility], [address], [place], [place] [address]    Studies/Results: DG C-Arm 1-60 Min-No Report  Result Date: 10/27/2019 Fluoroscopy was utilized by the requesting physician.  No radiographic interpretation.   Medications: I have reviewed the patient's current medications. Scheduled Meds: . amLODipine  10 mg Oral Daily  . Chlorhexidine Gluconate Cloth  6 each Topical Daily  . docusate sodium  100 mg Oral BID  . hydrochlorothiazide  25 mg Oral Daily  . sodium chloride flush  3 mL Intravenous Once   Continuous Infusions: PRN Meds:.hydrALAZINE, HYDROmorphone (DILAUDID) injection, ondansetron **OR** ondansetron (ZOFRAN) IV, oxyCODONE-acetaminophen, sodium chloride flush   Assessment: Active Problems:   Hypertension   Choledocholithiasis  Abnormal magnetic resonance imaging of liver   Earl Murray 64 y.o. male s/p ERCP for choledocholithiasis. Lipase normal this am .   Plan: Advanced diet and if tolerated can go home   LOS: 3 days   Earl Bellows, MD 10/28/2019, 9:29 AM

## 2019-10-28 NOTE — Discharge Summary (Signed)
[facility] NAME: Sierra Bissonette    MR#:  [id number]  DATE OF BIRTH:  02-Jul-1955  DATE OF ADMISSION:  10/25/2019 ADMITTING PHYSICIAN: Collier Bullock, MD  DATE OF DISCHARGE: 10/28/2019  PRIMARY CARE PHYSICIAN: Patient, No Pcp Per    ADMISSION DIAGNOSIS:  Choledocholithiasis [K80.50] Cholelithiasis [K80.20]  DISCHARGE DIAGNOSIS:  Abdominal pain of unclear etiology  SECONDARY DIAGNOSIS:   Past Medical History:  Diagnosis Date  . Hypertension   . Neck injury     HOSPITAL COURSE:  Treyvon Blahut a 64 y.o.malewith medical history significant forhypertension who presents to the emergency room for evaluation of periumbilical pain which he has had for over a month but which got worse on the day of admission and has been persistent prompting an emergency room visit. Pain is mostly in the epigastrium/periumbilical area.  Abdominal pain with abnormal MRCP showing CBD dilatation and feeling defect in the CBD. -CT scan of the abdomen and pelvis showed a dilated common bile duct   -MRCP was obtained which showed filling defect in CDB -LFT's normal -Patient has a white count of 12,000 and a low-grade fever -GI consult Dr. Bonna Gains appreciated. -6/4>s/p ERCP for mild dilated CBD--nothing found on ERCP -CLD--tolerated soft diet - lipase 23 -could be some gas pains also  Hypertension - on Hydralazine 10mg  IV q 6 for SBP > 163mmHg -continue hydrochlorothiazide and amlodipine  Overall stable. Pt advised to take stool softener as needed and f/u GI office if needed  DVT prophylaxis:SCD Code Status:Full Family Communication:no family in ER Disposition Plan:Back to previous home environment Consults called:GI   Status is: Inpatient Dispo: The patient is from: Home  Anticipated d/c is to: Home  Anticipated d/c date is: today  Patient currently is medically stable to d/c.   CONSULTS  OBTAINED:  Treatment Team:  Virgel Manifold, MD  DRUG ALLERGIES:   Allergies  Allergen Reactions  . Tylenol With Codeine #3 [Acetaminophen-Codeine] Itching  . Motrin [Ibuprofen] Rash  . Tramadol Rash    DISCHARGE MEDICATIONS:   Allergies as of 10/28/2019      Reactions   Tylenol With Codeine #3 [acetaminophen-codeine] Itching   Motrin [ibuprofen] Rash   Tramadol Rash      Medication List    TAKE these medications   amLODipine 10 MG tablet Commonly known as: NORVASC Take 10 mg by mouth daily.   baclofen 10 MG tablet Commonly known as: LIORESAL Take 1 tablet (10 mg total) by mouth 3 (three) times daily as needed (Back pain).   hydrochlorothiazide 25 MG tablet Commonly known as: HYDRODIURIL Take 1 tablet (25 mg total) by mouth daily.   ipratropium 0.06 % nasal spray Commonly known as: ATROVENT Place 2 sprays into both nostrils 4 (four) times daily as needed for rhinitis.   levocetirizine 5 MG tablet Commonly known as: Xyzal Take 1 tablet (5 mg total) by mouth every evening.   oxyCODONE-acetaminophen 10-325 MG tablet Commonly known as: PERCOCET Take 1 tablet by mouth every 6 (six) hours as needed for pain.       If you experience worsening of your admission symptoms, develop shortness of breath, life threatening emergency, suicidal or homicidal thoughts you must seek medical attention immediately by calling 911 or calling your MD immediately  if symptoms less severe.  You Must read complete instructions/literature along with all the possible adverse reactions/side effects for all the Medicines you take and that have been prescribed to you. Take any new Medicines after  you have completely understood and accept all the possible adverse reactions/side effects.   Please note  You were cared for by a hospitalist during your hospital stay. If you have any questions about your discharge medications or the care you received while you were in the hospital after you are  discharged, you can call the unit and asked to speak with the hospitalist on call if the hospitalist that took care of you is not available. Once you are discharged, your primary care physician will handle any further medical issues. Please note that NO REFILLS for any discharge medications will be authorized once you are discharged, as it is imperative that you return to your primary care physician (or establish a relationship with a primary care physician if you do not have one) for your aftercare needs so that they can reassess your need for medications and monitor your lab values. Today   SUBJECTIVE   Ate decent BF  VITAL SIGNS:  Blood pressure 124/82, pulse 60, temperature 98 F (36.7 C), temperature source Oral, resp. rate 13, height 6\' 5"  (1.956 m), weight 93.9 kg, SpO2 98 %.  I/O:    Intake/Output Summary (Last 24 hours) at 10/28/2019 0951 Last data filed at 10/28/2019 0837 Gross per 24 hour  Intake 500 ml  Output 1800 ml  Net -1300 ml    PHYSICAL EXAMINATION:  GENERAL:  64 y.o.-year-old patient lying in the bed with no acute distress.  EYES: Pupils equal, round, reactive to light and accommodation. No scleral icterus.  HEENT: Head atraumatic, normocephalic. Oropharynx and nasopharynx clear.  NECK:  Supple, no jugular venous distention. No thyroid enlargement, no tenderness.  LUNGS: Normal breath sounds bilaterally, no wheezing, rales,rhonchi or crepitation. No use of accessory muscles of respiration.  CARDIOVASCULAR: S1, S2 normal. No murmurs, rubs, or gallops.  ABDOMEN: Soft, non-tender, non-distended. Bowel sounds present. No organomegaly or mass.  EXTREMITIES: No pedal edema, cyanosis, or clubbing.  NEUROLOGIC: Cranial nerves II through XII are intact. Muscle strength 5/5 in all extremities. Sensation intact. Gait not checked.  PSYCHIATRIC: The patient is alert and oriented x 3.  SKIN: No obvious rash, lesion, or ulcer.   DATA REVIEW:   CBC  Recent Labs  Lab  10/26/19 0449  WBC 10.5  HGB 13.6  HCT 40.0  PLT 273    Chemistries  Recent Labs  Lab 10/26/19 0956 10/26/19 0956 10/27/19 0640  NA 137  --   --   K 4.1  --   --   CL 101  --   --   CO2 27  --   --   GLUCOSE 119*  --   --   BUN 20  --   --   CREATININE 1.17   < > 1.33*  CALCIUM 9.3  --   --   AST 21  --   --   ALT 20  --   --   ALKPHOS 63  --   --   BILITOT 0.8  --   --    < > = values in this interval not displayed.    Microbiology Results   Recent Results (from the past 240 hour(s))  SARS Coronavirus 2 by RT PCR (hospital order, performed in [facility] hospital lab) Nasopharyngeal Nasopharyngeal Swab     Status: None   Collection Time: 10/25/19  8:25 PM   Specimen: Nasopharyngeal Swab  Result Value Ref Range Status   SARS Coronavirus 2 NEGATIVE NEGATIVE Final    Comment: (NOTE) SARS-CoV-2 target  nucleic acids are NOT DETECTED. The SARS-CoV-2 RNA is generally detectable in upper and lower respiratory specimens during the acute phase of infection. The lowest concentration of SARS-CoV-2 viral copies this assay can detect is 250 copies / mL. A negative result does not preclude SARS-CoV-2 infection and should not be used as the sole basis for treatment or other patient management decisions.  A negative result may occur with improper specimen collection / handling, submission of specimen other than nasopharyngeal swab, presence of viral mutation(s) within the areas targeted by this assay, and inadequate number of viral copies (<250 copies / mL). A negative result must be combined with clinical observations, patient history, and epidemiological information. Fact Sheet for Patients:   StrictlyIdeas.no Fact Sheet for Healthcare Providers: BankingDealers.co.za This test is not yet approved or cleared  by the [place] FDA and has been authorized for detection and/or diagnosis of SARS-CoV-2 by FDA under an Emergency Use  Authorization (EUA).  This EUA will remain in effect (meaning this test can be used) for the duration of the COVID-19 declaration under Section 564(b)(1) of the Act, 21 U.S.C. section 360bbb-3(b)(1), unless the authorization is terminated or revoked sooner. Performed at [facility], [address] [place], [place] [address]     RADIOLOGY:  DG C-Arm 1-60 Min-No Report  Result Date: 10/27/2019 Fluoroscopy was utilized by the requesting physician.  No radiographic interpretation.     CODE STATUS:     Code Status Orders  (From admission, onward)         Start     Ordered   10/25/19 2259  Full code  Continuous     10/25/19 2300        Code Status History    This patient has a current code status but no historical code status.   Advance Care Planning Activity    Advance Directive Documentation     Most Recent Value  Type of Advance Directive  Healthcare Power of Attorney  Pre-existing out of facility DNR order (yellow form or pink MOST form)  --  "MOST" Form in Place?  --       TOTAL TIME TAKING CARE OF THIS PATIENT: 40 minutes.    Fritzi Mandes M.D  [facility]    CC: Primary care physician; Patient, No Pcp Per

## 2019-10-28 NOTE — Plan of Care (Signed)
Discharge order received. Patient mental status is at baseline. Vital signs stable . No signs of acute distress. Discharge instructions given. Patient verbalized understanding. No other issues noted at this time.   

## 2019-10-30 ENCOUNTER — Encounter: Payer: Self-pay | Admitting: *Deleted

## 2019-11-24 ENCOUNTER — Other Ambulatory Visit: Payer: Self-pay

## 2019-11-24 ENCOUNTER — Emergency Department: Payer: Medicare Other

## 2019-11-24 ENCOUNTER — Emergency Department
Admission: EM | Admit: 2019-11-24 | Discharge: 2019-11-25 | Disposition: A | Discharge status: Home / Self Care | Payer: Medicare Other | Attending: Emergency Medicine | Admitting: Emergency Medicine

## 2019-11-24 ENCOUNTER — Encounter: Payer: Self-pay | Admitting: Emergency Medicine

## 2019-11-24 DIAGNOSIS — I10 Essential (primary) hypertension: Secondary | ICD-10-CM | POA: Diagnosis not present

## 2019-11-24 DIAGNOSIS — R1013 Epigastric pain: Secondary | ICD-10-CM | POA: Diagnosis not present

## 2019-11-24 DIAGNOSIS — R1032 Left lower quadrant pain: Secondary | ICD-10-CM | POA: Insufficient documentation

## 2019-11-24 DIAGNOSIS — Z79899 Other long term (current) drug therapy: Secondary | ICD-10-CM | POA: Diagnosis not present

## 2019-11-24 DIAGNOSIS — Z87891 Personal history of nicotine dependence: Secondary | ICD-10-CM | POA: Insufficient documentation

## 2019-11-24 DIAGNOSIS — K8050 Calculus of bile duct without cholangitis or cholecystitis without obstruction: Secondary | ICD-10-CM

## 2019-11-24 DIAGNOSIS — R197 Diarrhea, unspecified: Secondary | ICD-10-CM | POA: Diagnosis not present

## 2019-11-24 DIAGNOSIS — R1033 Periumbilical pain: Secondary | ICD-10-CM | POA: Diagnosis not present

## 2019-11-24 DIAGNOSIS — R1011 Right upper quadrant pain: Secondary | ICD-10-CM | POA: Diagnosis present

## 2019-11-24 LAB — CBC
HCT: 43.8 % (ref 39.0–52.0)
Hemoglobin: 15.2 g/dL (ref 13.0–17.0)
MCH: 31.7 pg (ref 26.0–34.0)
MCHC: 34.7 g/dL (ref 30.0–36.0)
MCV: 91.3 fL (ref 80.0–100.0)
Platelets: 290 10*3/uL (ref 150–400)
RBC: 4.8 MIL/uL (ref 4.22–5.81)
RDW: 14 % (ref 11.5–15.5)
WBC: 14.4 10*3/uL — ABNORMAL HIGH (ref 4.0–10.5)
nRBC: 0 % (ref 0.0–0.2)

## 2019-11-24 LAB — BASIC METABOLIC PANEL
Anion gap: 11 (ref 5–15)
BUN: 22 mg/dL (ref 8–23)
CO2: 29 mmol/L (ref 22–32)
Calcium: 9.7 mg/dL (ref 8.9–10.3)
Chloride: 98 mmol/L (ref 98–111)
Creatinine, Ser: 1.4 mg/dL — ABNORMAL HIGH (ref 0.61–1.24)
GFR calc Af Amer: >60 mL/min (ref >60)
GFR calc non Af Amer: 52 mL/min — ABNORMAL LOW (ref >60)
Glucose, Bld: 98 mg/dL (ref 70–99)
Potassium: 3.5 mmol/L (ref 3.5–5.1)
Sodium: 138 mmol/L (ref 135–145)

## 2019-11-24 LAB — HEPATIC FUNCTION PANEL
ALT: 19 U/L (ref 0–44)
AST: 24 U/L (ref 15–41)
Albumin: 4.5 g/dL (ref 3.5–5.0)
Alkaline Phosphatase: 69 U/L (ref 38–126)
Bilirubin, Direct: 0.3 mg/dL — ABNORMAL HIGH (ref 0.0–0.2)
Indirect Bilirubin: 0.8 mg/dL (ref 0.3–0.9)
Total Bilirubin: 1.1 mg/dL (ref 0.3–1.2)
Total Protein: 8.3 g/dL — ABNORMAL HIGH (ref 6.5–8.1)

## 2019-11-24 LAB — LIPASE, BLOOD: Lipase: 30 U/L (ref 11–51)

## 2019-11-24 MED ORDER — MORPHINE SULFATE (PF) 4 MG/ML IV SOLN
4.0000 mg | Freq: Once | INTRAVENOUS | Status: AC
  Administered 2019-11-24: 4 mg via INTRAVENOUS
  Filled 2019-11-24: qty 1

## 2019-11-24 MED ORDER — SODIUM CHLORIDE 0.9 % IV BOLUS
1000.0000 mL | Freq: Once | INTRAVENOUS | Status: AC
  Administered 2019-11-24: 1000 mL via INTRAVENOUS

## 2019-11-24 MED ORDER — ONDANSETRON HCL 4 MG/2ML IJ SOLN
4.0000 mg | Freq: Once | INTRAMUSCULAR | Status: AC
  Administered 2019-11-24: 4 mg via INTRAVENOUS
  Filled 2019-11-24: qty 2

## 2019-11-24 MED ORDER — FENTANYL CITRATE (PF) 100 MCG/2ML IJ SOLN
50.0000 ug | INTRAMUSCULAR | Status: DC | PRN
  Administered 2019-11-24: 50 ug via INTRAVENOUS
  Filled 2019-11-24: qty 2

## 2019-11-24 NOTE — ED Triage Notes (Signed)
Patient brought in by ems from home. Patient with complaint of right upper abdominal pain that started Monday. Patient had his gallbladder removed 2 years ago and had gallstones removed from his bile duct 2 weeks ago. Patient states that the pain feels similar.

## 2019-11-24 NOTE — ED Provider Notes (Signed)
[facility] Emergency Department Provider Note  ____________________________________________   First MD Initiated Contact with Patient 11/24/19 2308     (approximate)  I have reviewed the triage vital signs and the nursing notes.   HISTORY  Chief Complaint Abdominal Pain   HPI Earl Murray is a 64 y.o. male brought to the ED via EMS from home with a chief complaint of right upper quadrant abdominal pain.  Patient recently had choledocholithiasis status post ERCP on 10/27/2019.  States pain returned 5 days ago and feels similarly.  Complains of pain above his umbilicus and left lower quadrant pain which is similar to the pain he had which required hospitalization.  Denies fever, chills, cough, chest pain, shortness of breath.  States he had one episode of nausea/vomiting 5 days ago when the pain first started.  Ever since he has had multiple daily bouts of diarrhea.  Denies recent travel, trauma or antibiotic use.       Past Medical History:  Diagnosis Date  . Hypertension   . Neck injury     Patient Active Problem List   Diagnosis Date Noted  . Abnormal magnetic resonance imaging of liver   . Choledocholithiasis 10/25/2019  . Hypertension   . Hallux valgus, acquired 04/20/2016  . 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 Stress reaction 04/20/2016    Past Surgical History:  Procedure Laterality Date  . BACK SURGERY    . CHOLECYSTECTOMY    . ELBOW SURGERY    . ERCP N/A 10/27/2019   Procedure: ENDOSCOPIC RETROGRADE CHOLANGIOPANCREATOGRAPHY (ERCP);  Surgeon: Lucilla Lame, MD;  Location: [facility] ENDOSCOPY;  Service: Endoscopy;  Laterality: N/A;  . FOOT SURGERY    . HERNIA REPAIR     4 hernia repairs    Prior to Admission medications   Medication Sig Start Date End Date Taking? Authorizing Provider  amLODipine (NORVASC) 10 MG tablet Take 10 mg by mouth daily.     [provider]  baclofen (LIORESAL) 10 MG tablet Take 1 tablet (10 mg total) by  mouth 3 (three) times daily as needed (Back pain). 03/04/19   Coral Spikes, DO  hydrochlorothiazide (HYDRODIURIL) 25 MG tablet Take 1 tablet (25 mg total) by mouth daily. 08/15/18   Thersa Salt G, DO  ipratropium (ATROVENT) 0.06 % nasal spray Place 2 sprays into both nostrils 4 (four) times daily as needed for rhinitis. 10/22/19   Karen Kitchens, NP  levocetirizine (XYZAL) 5 MG tablet Take 1 tablet (5 mg total) by mouth every evening. 09/30/19   Coral Spikes, DO  ondansetron (ZOFRAN ODT) 4 MG disintegrating tablet Take 1 tablet (4 mg total) by mouth every 8 (eight) hours as needed for nausea or vomiting. 11/25/19   Paulette Blanch, MD  oxyCODONE-acetaminophen (PERCOCET) 10-325 MG tablet Take 1 tablet by mouth every 6 (six) hours as needed for pain.  08/05/19   [provider]  gabapentin (NEURONTIN) 300 MG capsule gabapentin 300 mg capsule  one tab bid-tid  03/01/19  [provider]    Allergies Tylenol with codeine #3 [acetaminophen-codeine], Motrin [ibuprofen], and Tramadol  Family History  Problem Relation Age of Onset  . Hypertension Mother   . Heart disease Mother   . Colon cancer Father     Social History Social History   Tobacco Use  . Smoking status: Former Smoker    Types: Cigarettes    Quit date: 05/2018    Years since quitting: 1.5  . Smokeless tobacco: Former  User  Vaping Use  . Vaping Use: Never used  Substance Use Topics  . Alcohol use: Not Currently  . Drug use: Not Currently    Review of Systems  Constitutional: No fever/chills Eyes: No visual changes. ENT: No sore throat. Cardiovascular: Denies chest pain. Respiratory: Denies shortness of breath. Gastrointestinal: Positive for abdominal pain.  No nausea, no vomiting.  Positive for diarrhea.  No constipation. Genitourinary: Negative for dysuria. Musculoskeletal: Negative for back pain. Skin: Negative for rash. Neurological: Negative for headaches, focal weakness or  numbness.   ____________________________________________   PHYSICAL EXAM:  VITAL SIGNS: ED Triage Vitals  Enc Vitals Group     BP 11/24/19 1948 (!) 161/95     Pulse Rate 11/24/19 1948 90     Resp 11/24/19 1948 18     Temp 11/24/19 1948 98.3 F (36.8 C)     Temp Source 11/24/19 1948 Oral     SpO2 11/24/19 1948 98 %     Weight 11/24/19 1947 205 lb (93 kg)     Height 11/24/19 1947 6\' 5"  (1.956 m)     Head Circumference --      Peak Flow --      Pain Score 11/24/19 1947 10     Pain Loc --      Pain Edu? --      Excl. in [place]? --     Constitutional: Alert and oriented. Well appearing and in no acute distress. Eyes: Conjunctivae are normal. PERRL. EOMI. Head: Atraumatic. Nose: No congestion/rhinnorhea. Mouth/Throat: Mucous membranes are moist.   Neck: No stridor.   Cardiovascular: Normal rate, regular rhythm. Grossly normal heart sounds.  Good peripheral circulation. Respiratory: Normal respiratory effort.  No retractions. Lungs CTAB. Gastrointestinal: Soft and mildly tender to palpation epigastrium, umbilicus and left lower quadrant without rebound or guarding. No distention. No abdominal bruits. No CVA tenderness. Musculoskeletal: No lower extremity tenderness nor edema.  No joint effusions. Neurologic:  Normal speech and language. No gross focal neurologic deficits are appreciated. No gait instability. Skin:  Skin is warm, dry and intact. No rash noted. Psychiatric: Mood and affect are normal. Speech and behavior are normal.  ____________________________________________   LABS (all labs ordered are listed, but only abnormal results are displayed)  Labs Reviewed  CBC - Abnormal; Notable for the following components:      Result Value   WBC 14.4 (*)    All other components within normal limits  BASIC METABOLIC PANEL - Abnormal; Notable for the following components:   Creatinine, Ser 1.40 (*)    GFR calc non Af Amer 52 (*)    All other components within normal limits   HEPATIC FUNCTION PANEL - Abnormal; Notable for the following components:   Total Protein 8.3 (*)    Bilirubin, Direct 0.3 (*)    All other components within normal limits  C DIFFICILE QUICK SCREEN W PCR REFLEX  GASTROINTESTINAL PANEL BY PCR, STOOL (REPLACES STOOL CULTURE)  LIPASE, BLOOD  URINALYSIS, COMPLETE (UACMP) WITH MICROSCOPIC   ____________________________________________  EKG  ED ECG REPORT I, Chelle Cayton J, the attending physician, personally viewed and interpreted this ECG.   Date: 11/25/2019  EKG Time: 1950  Rate: 89  Rhythm: normal EKG, normal sinus rhythm  Axis: Normal  Intervals:none  ST&T Change: Nonspecific  ____________________________________________  RADIOLOGY  ED MD interpretation: MRCP demonstrates no acute choledocholithiasis  Official radiology report(s): MR 3D Recon At Scanner  Result Date: 11/25/2019 CLINICAL DATA:  64 year old male with history of choledocholithiasis. Right upper quadrant  abdominal pain. EXAM: MRI ABDOMEN WITHOUT AND WITH CONTRAST (INCLUDING MRCP) TECHNIQUE: Multiplanar multisequence MR imaging of the abdomen was performed both before and after the administration of intravenous contrast. Heavily T2-weighted images of the biliary and pancreatic ducts were obtained, and three-dimensional MRCP images were rendered by post processing. CONTRAST:  7.55mL GADAVIST GADOBUTROL 1 MMOL/ML IV SOLN COMPARISON:  Abdominal MRI 10/25/2019. FINDINGS: Lower chest: Unremarkable. Hepatobiliary: No suspicious cystic or solid hepatic lesions. Previously noted mild intrahepatic biliary ductal dilatation has resolved. There continues to be moderate to severe extrahepatic biliary ductal dilatation, with the common bile duct measuring up to 20 mm in diameter on today's examination. Previously noted filling defect in the distal common bile duct has resolved on today's study. Pancreas: No pancreatic mass. No pancreatic ductal dilatation. No pancreatic or peripancreatic  fluid collections or inflammatory changes. Spleen:  Unremarkable. Adrenals/Urinary Tract: In the kidneys bilaterally there are multiple T1 hypointense, T2 hyperintense, nonenhancing lesions, compatible with Bosniak class 1 simple cysts, largest of which is in the interpolar region of the left kidney measuring up to 2.4 cm in diameter. In addition, in the anterior aspect of the interpolar region of the right kidney there is a 5 mm T1 hyperintense, T2 hypointense, nonenhancing lesion, compatible with a proteinaceous/hemorrhagic cyst (Bosniak class 2). No hydroureteronephrosis in the visualized portions of the abdomen. Bilateral adrenal glands are normal in appearance. Stomach/Bowel: Visualized portions are unremarkable. Vascular/Lymphatic: Aortic atherosclerosis, without evidence of aneurysm in the abdominal vasculature. No lymphadenopathy noted in the abdomen. Other: No significant volume of ascites noted in the visualized portions of the peritoneal cavity. Musculoskeletal: Signal void in the lumbar spine related to indwelling spinal hardware. No aggressive appearing osseous lesions are noted in the visualized portions of the skeleton. IMPRESSION: 1. Previously noted filling defect in the distal common bile duct is no longer evident, suggesting passage of prior choledocholithiasis. There continues to be some moderate to severe extrahepatic biliary ductal dilatation with the common bile duct measuring up to 20 mm in diameter, however, this is likely reflective of benign post cholecystectomy physiology in this patient. No residual intrahepatic biliary ductal dilatation. 2. Multiple small Bosniak class 1 and Bosniak class 2 renal cysts, as above. Electronically Signed   By: Vinnie Langton M.D.   On: 11/25/2019 04:40   MR ABDOMEN MRCP W WO CONTAST  Result Date: 11/25/2019 CLINICAL DATA:  64 year old male with history of choledocholithiasis. Right upper quadrant abdominal pain. EXAM: MRI ABDOMEN WITHOUT AND WITH  CONTRAST (INCLUDING MRCP) TECHNIQUE: Multiplanar multisequence MR imaging of the abdomen was performed both before and after the administration of intravenous contrast. Heavily T2-weighted images of the biliary and pancreatic ducts were obtained, and three-dimensional MRCP images were rendered by post processing. CONTRAST:  7.14mL GADAVIST GADOBUTROL 1 MMOL/ML IV SOLN COMPARISON:  Abdominal MRI 10/25/2019. FINDINGS: Lower chest: Unremarkable. Hepatobiliary: No suspicious cystic or solid hepatic lesions. Previously noted mild intrahepatic biliary ductal dilatation has resolved. There continues to be moderate to severe extrahepatic biliary ductal dilatation, with the common bile duct measuring up to 20 mm in diameter on today's examination. Previously noted filling defect in the distal common bile duct has resolved on today's study. Pancreas: No pancreatic mass. No pancreatic ductal dilatation. No pancreatic or peripancreatic fluid collections or inflammatory changes. Spleen:  Unremarkable. Adrenals/Urinary Tract: In the kidneys bilaterally there are multiple T1 hypointense, T2 hyperintense, nonenhancing lesions, compatible with Bosniak class 1 simple cysts, largest of which is in the interpolar region of the left kidney measuring up to  2.4 cm in diameter. In addition, in the anterior aspect of the interpolar region of the right kidney there is a 5 mm T1 hyperintense, T2 hypointense, nonenhancing lesion, compatible with a proteinaceous/hemorrhagic cyst (Bosniak class 2). No hydroureteronephrosis in the visualized portions of the abdomen. Bilateral adrenal glands are normal in appearance. Stomach/Bowel: Visualized portions are unremarkable. Vascular/Lymphatic: Aortic atherosclerosis, without evidence of aneurysm in the abdominal vasculature. No lymphadenopathy noted in the abdomen. Other: No significant volume of ascites noted in the visualized portions of the peritoneal cavity. Musculoskeletal: Signal void in the  lumbar spine related to indwelling spinal hardware. No aggressive appearing osseous lesions are noted in the visualized portions of the skeleton. IMPRESSION: 1. Previously noted filling defect in the distal common bile duct is no longer evident, suggesting passage of prior choledocholithiasis. There continues to be some moderate to severe extrahepatic biliary ductal dilatation with the common bile duct measuring up to 20 mm in diameter, however, this is likely reflective of benign post cholecystectomy physiology in this patient. No residual intrahepatic biliary ductal dilatation. 2. Multiple small Bosniak class 1 and Bosniak class 2 renal cysts, as above. Electronically Signed   By: Vinnie Langton M.D.   On: 11/25/2019 04:40    ____________________________________________   PROCEDURES  Procedure(s) performed (including Critical Care):  Procedures   ____________________________________________   INITIAL IMPRESSION / ASSESSMENT AND PLAN / ED COURSE  As part of my medical decision making, I reviewed the following data within the [place] notes reviewed and incorporated, Labs reviewed, EKG interpreted, Old chart reviewed, Radiograph reviewed and Notes from prior ED visits     Earl Murray was evaluated in Emergency Department on 11/25/2019 for the symptoms described in the history of present illness. He was evaluated in the context of the global COVID-19 pandemic, which necessitated consideration that the patient might be at risk for infection with the SARS-CoV-2 virus that causes COVID-19. Institutional protocols and algorithms that pertain to the evaluation of patients at risk for COVID-19 are in a state of rapid change based on information released by regulatory bodies including the CDC and federal and state organizations. These policies and algorithms were followed during the patient's care in the ED.    64 year old male presenting with abdominal pain; recent  choledocholithiasis status post ERCP. Differential diagnosis includes, but is not limited to, biliary disease (biliary colic, acute cholecystitis, cholangitis, choledocholithiasis, etc), intrathoracic causes for epigastric abdominal pain including ACS, gastritis, duodenitis, pancreatitis, small bowel or large bowel obstruction, abdominal aortic aneurysm, hernia, and ulcer(s).  Laboratory results remarkable for moderate leukocytosis and mild AKI.  While it would be unlikely status post ERCP to have obstructing stones, patient is insistent that he is having the same pain he was previously.  Will administer IV morphine for pain and proceed with MRCP.   Clinical Course as of Nov 24 599  Sat Nov 25, 2019  0145 Discussed MRCP with Dr. Marguerita Merles; no clear intraductal filling defects identified, no stones.   [JS]  0220 Updated patient on MRCP results.  Will discharge home with Percocet and Zofran to use as needed.  Patient did not need to stool while in the ED.  He will follow up closely with his PCP.  Strict return precautions given.  Patient verbalized understanding and agrees with plan of care.   [JS]  0224 When I reviewed patient's PMP database, he had #120 10 mg Percocets filled on 11/01/2019 which is supposed to last him 30 days.   [JS]  Clinical Course User Index [JS] Paulette Blanch, MD     ____________________________________________   FINAL CLINICAL IMPRESSION(S) / ED DIAGNOSES  Final diagnoses:  Epigastric pain  Diarrhea, unspecified type     ED Discharge Orders         Ordered    ondansetron (ZOFRAN ODT) 4 MG disintegrating tablet  Every 8 hours PRN     Discontinue  Reprint     11/25/19 0222           Note:  This document was prepared using Dragon voice recognition software and may include unintentional dictation errors.   Paulette Blanch, MD 11/25/19 [phone]

## 2019-11-25 ENCOUNTER — Emergency Department: Payer: Medicare Other

## 2019-11-25 DIAGNOSIS — R1013 Epigastric pain: Secondary | ICD-10-CM | POA: Diagnosis not present

## 2019-11-25 IMAGING — MR MR 3D RECON AT SCANNER
1 series · 13 of 16 positions shown · IV contrast (gadavist)
Comparison: Abdominal MRI [DATE].

CLINICAL DATA: 64-year-old male with history of
choledocholithiasis. Right upper quadrant abdominal pain.

EXAM:
MRI ABDOMEN WITHOUT AND WITH CONTRAST (INCLUDING MRCP)
TECHNIQUE: Multiplanar multisequence MR imaging of the abdomen was performed
both before and after the administration of intravenous contrast.
Heavily T2-weighted images of the biliary and pancreatic ducts were
obtained, and three-dimensional MRCP images were rendered by post
processing.
CONTRAST:  7.5mL GADAVIST GADOBUTROL 1 MMOL/ML IV SOLN

[Series 11: MRCP · coronal · 1.0mm · 0.49mm/px · 13 of 80 slices shown]
[im 1/80]
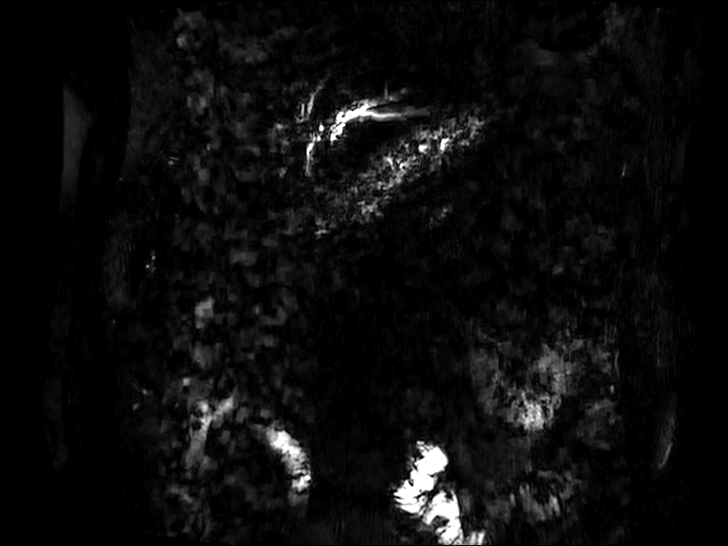
[im 6/80]
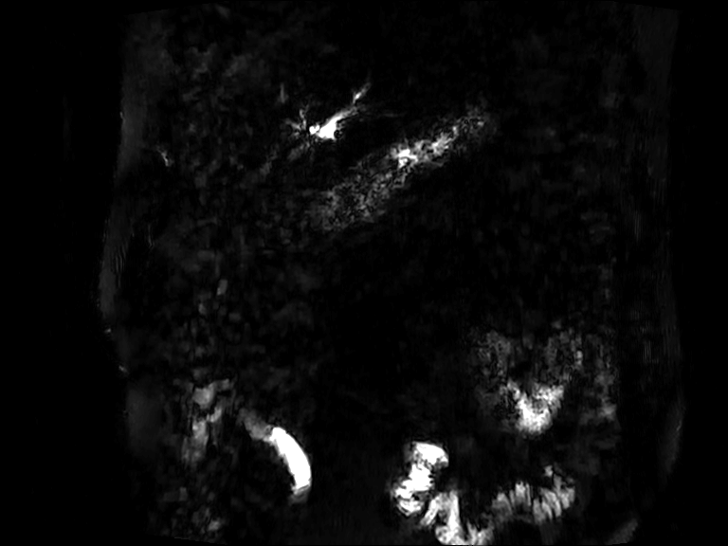
[im 16/80]
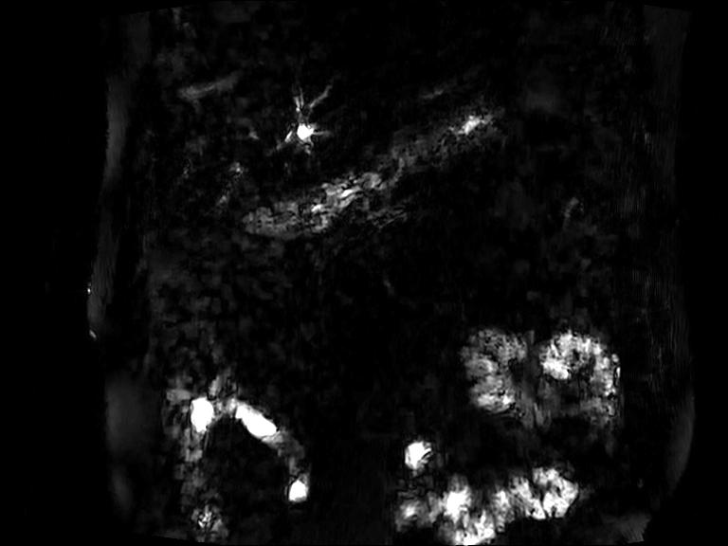
[im 22/80]
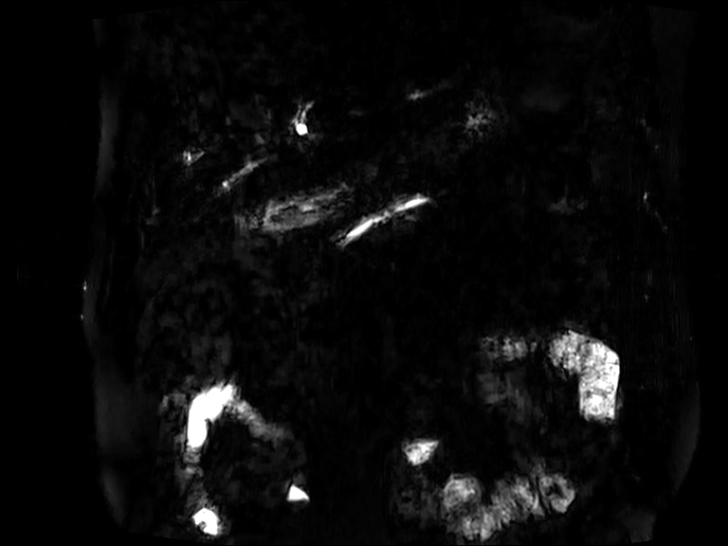
[im 27/80]
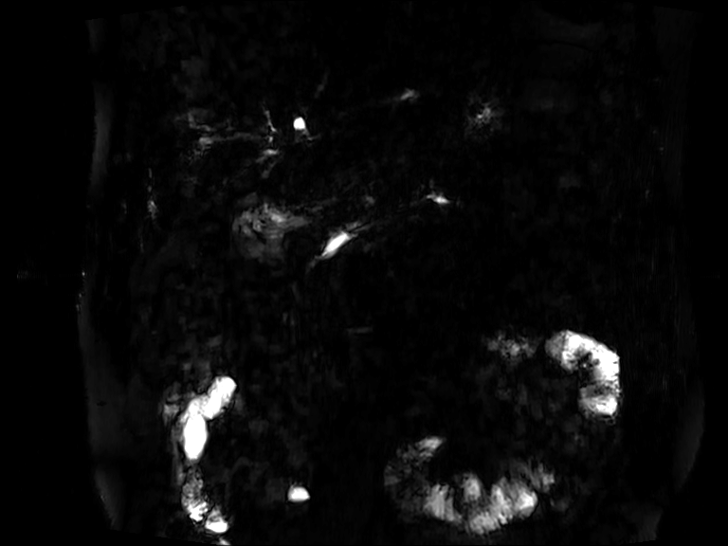
[im 32/80]
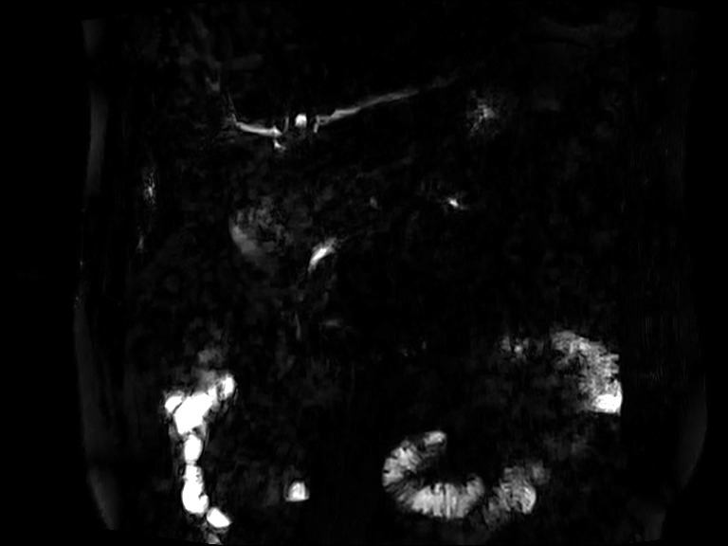
[im 43/80]
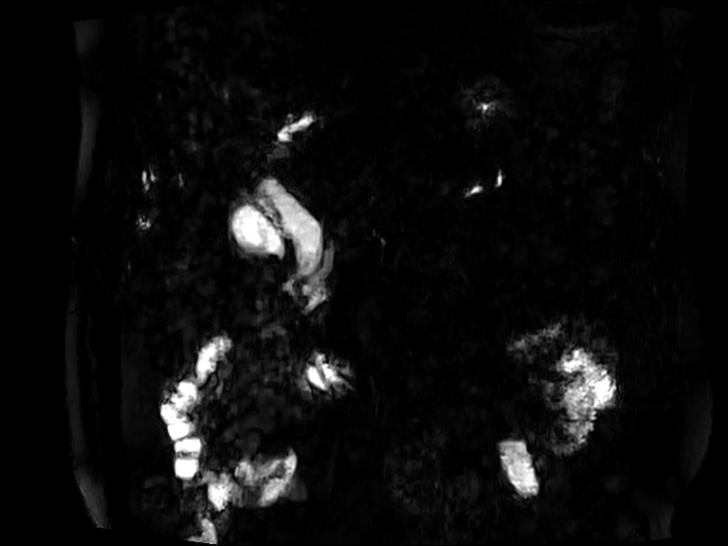
[im 48/80]
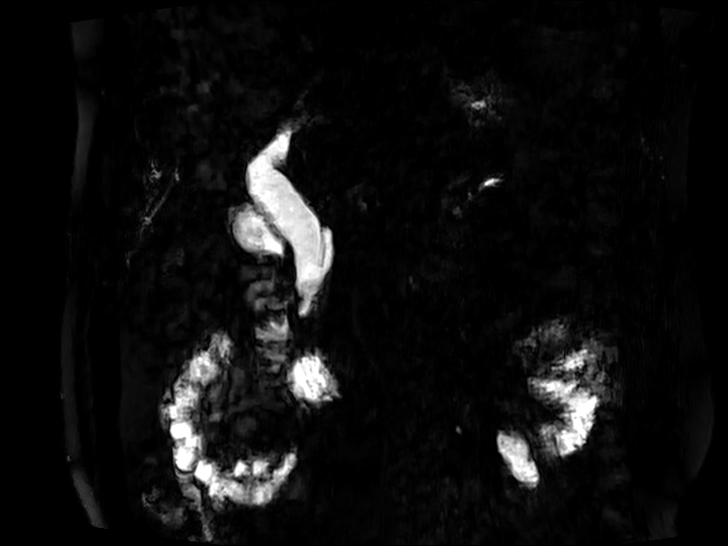
[im 53/80]
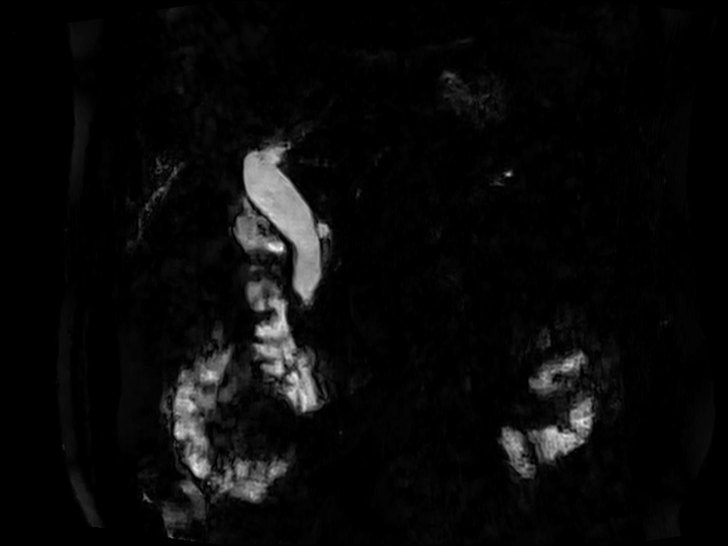
[im 58/80]
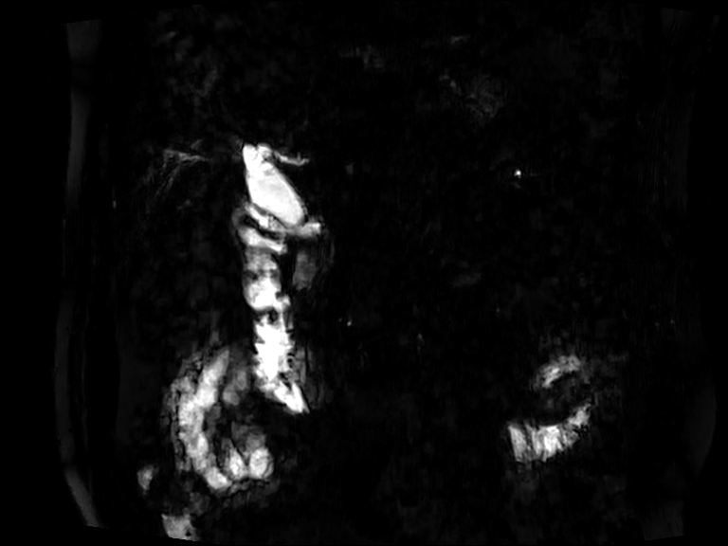
[im 64/80]
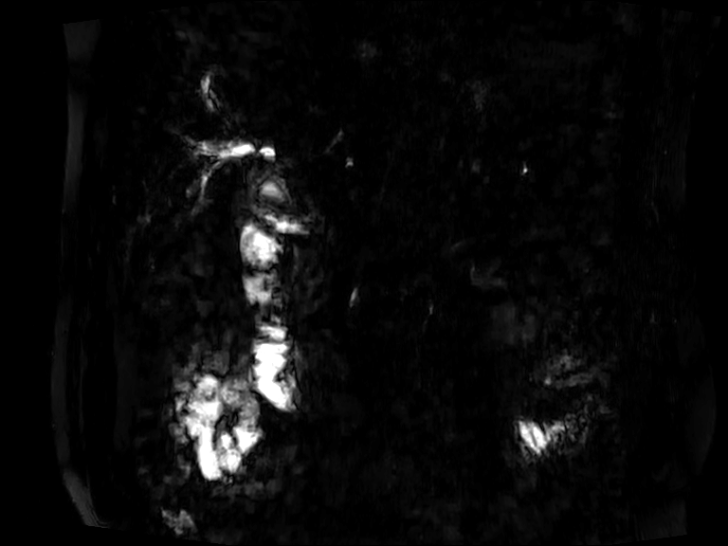
[im 74/80]
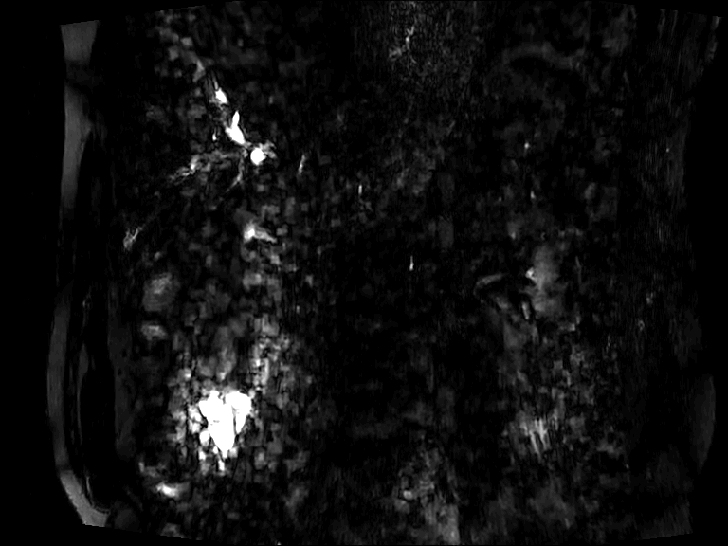
[im 80/80]
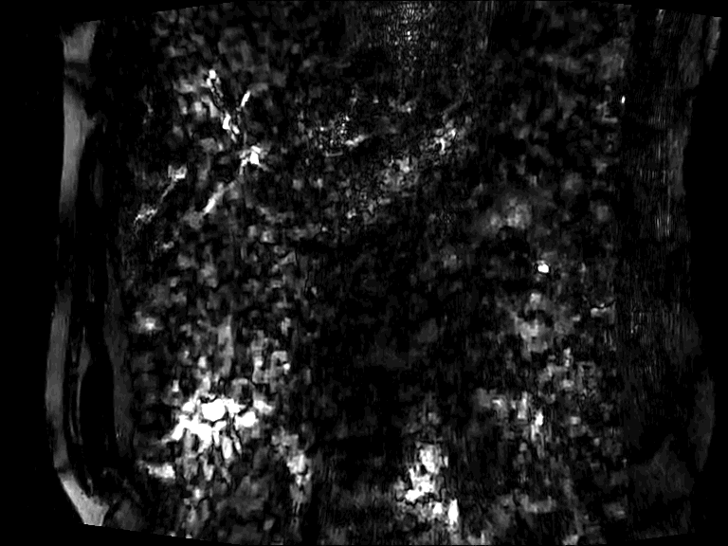

[13 of 16 positions shown; findings below may reference images not displayed]

FINDINGS: Lower chest: Unremarkable.

Hepatobiliary: No suspicious cystic or solid hepatic lesions.
Previously noted mild intrahepatic biliary ductal dilatation has
resolved. There continues to be moderate to severe extrahepatic
biliary ductal dilatation, with the common bile duct measuring up to
20 mm in diameter on today's examination. Previously noted filling
defect in the distal common bile duct has resolved on today's study.

Pancreas: No pancreatic mass. No pancreatic ductal dilatation. No
pancreatic or peripancreatic fluid collections or inflammatory
changes.

Spleen:  Unremarkable.

Adrenals/Urinary Tract: In the kidneys bilaterally there are
multiple T1 hypointense, T2 hyperintense, nonenhancing lesions,
compatible with Bosniak class 1 simple cysts, largest of which is in
the interpolar region of the left kidney measuring up to 2.4 cm in
diameter. In addition, in the anterior aspect of the interpolar
region of the right kidney there is a 5 mm T1 hyperintense, T2
hypointense, nonenhancing lesion, compatible with a
proteinaceous/hemorrhagic cyst (Bosniak class 2). No
hydroureteronephrosis in the visualized portions of the abdomen.
Bilateral adrenal glands are normal in appearance.

Stomach/Bowel: Visualized portions are unremarkable.

Vascular/Lymphatic: Aortic atherosclerosis, without evidence of
aneurysm in the abdominal vasculature. No lymphadenopathy noted in
the abdomen.

Other: No significant volume of ascites noted in the visualized
portions of the peritoneal cavity.

Musculoskeletal: Signal void in the lumbar spine related to
indwelling spinal hardware. No aggressive appearing osseous lesions
are noted in the visualized portions of the skeleton.
IMPRESSION: 1. Previously noted filling defect in the distal common bile duct is
no longer evident, suggesting passage of prior choledocholithiasis.
There continues to be some moderate to severe extrahepatic biliary
ductal dilatation with the common bile duct measuring up to 20 mm in
diameter, however, this is likely reflective of benign post
cholecystectomy physiology in this patient. No residual intrahepatic
biliary ductal dilatation.
2. Multiple small Bosniak class 1 and Bosniak class 2 renal cysts,
as above.

## 2019-11-25 IMAGING — MR MR ABDOMEN WO/W CM MRCP
19 of 20 series · 45 of 48 positions shown · IV contrast (7.5ml Gadavist)
Comparison: Abdominal MRI [DATE].

CLINICAL DATA: 64-year-old male with history of
choledocholithiasis. Right upper quadrant abdominal pain.

EXAM:
MRI ABDOMEN WITHOUT AND WITH CONTRAST (INCLUDING MRCP)
TECHNIQUE: Multiplanar multisequence MR imaging of the abdomen was performed
both before and after the administration of intravenous contrast.
Heavily T2-weighted images of the biliary and pancreatic ducts were
obtained, and three-dimensional MRCP images were rendered by post
processing.
CONTRAST:  7.5mL GADAVIST GADOBUTROL 1 MMOL/ML IV SOLN

[Series 3: T2 · coronal · 6.0mm · 1.19mm/px · 2 of 32 slices shown (1 of 2)]
[im 1/32]
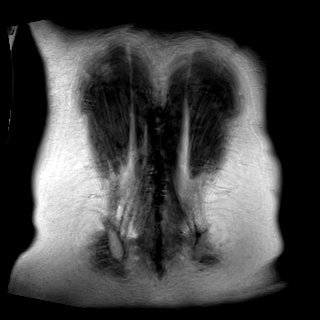
[im 32/32]
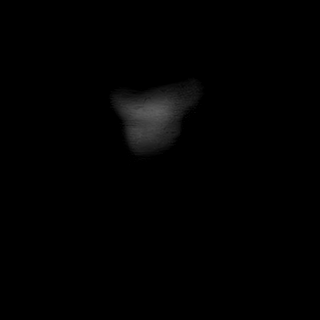

[Series 4: T2 · axial · 6.0mm · 1.19mm/px · z∈[-66,+157]mm · 2 of 32 slices shown (2 of 2)]
[im 1/32]
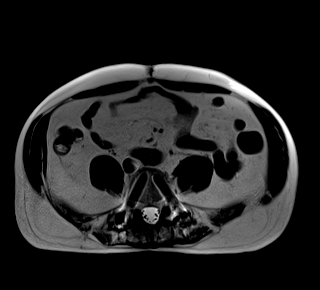
[im 32/32]
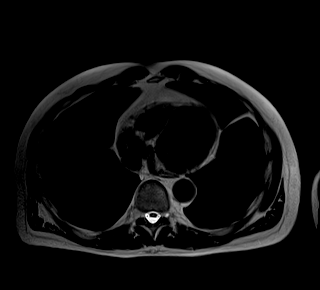

[Series 5: T1 · axial · 6.0mm · 0.74mm/px · z∈[-66,+157]mm · 2 of 32 slices shown (1 of 2)]
[im 1/32]
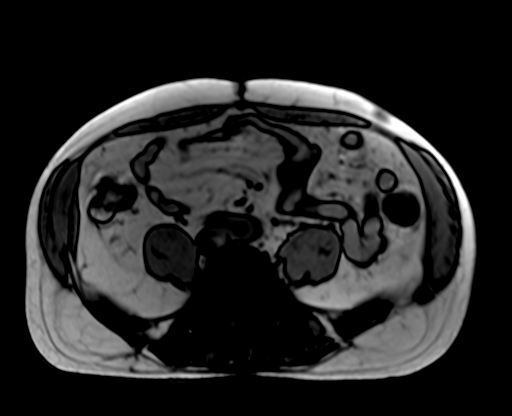
[im 32/32]
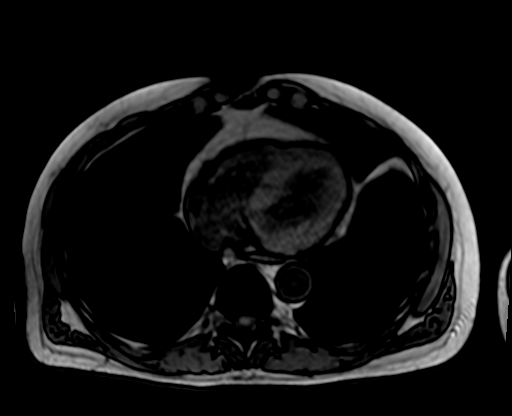

[Series 5: T1 · axial · 6.0mm · 0.74mm/px · 1 of 32 slices shown (2 of 2)]
[im 1/32]
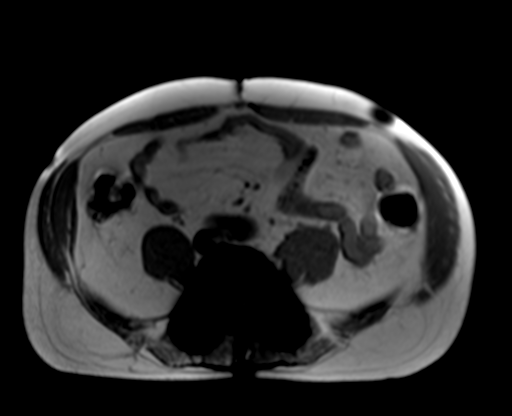

[Series 14: ax dwi_tracew · axial · 6.0mm · 1.42mm/px · z∈[-51,+172]mm · 4 of 96 slices shown]
[im 1/96]
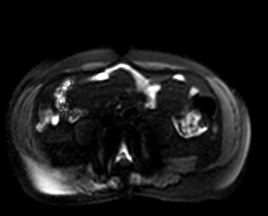
[im 32/96]
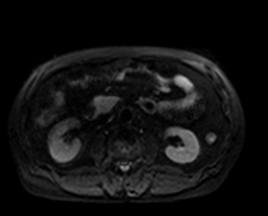
[im 64/96]
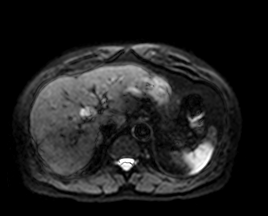
[im 96/96]
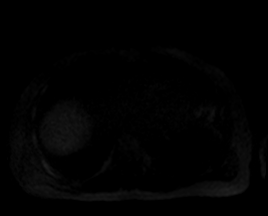

[Series 15: ax dwi_adc · axial · 6.0mm · 1.42mm/px · 1 of 32 slices shown]
[im 1/32]
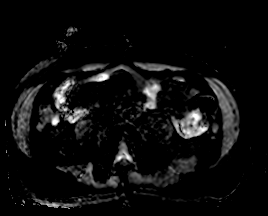

[Series 18: T2 fat-sat · axial · 6.0mm · 1.19mm/px · 1 of 32 slices shown]
[im 1/32]
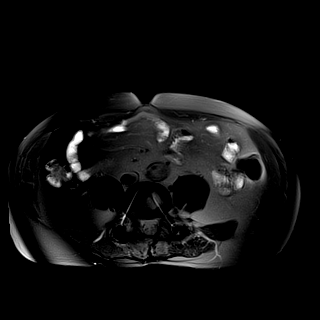

[Series 19: MRCP · coronal · 3.0mm · 1.12mm/px · 1 of 20 slices shown]
[im 1/20]
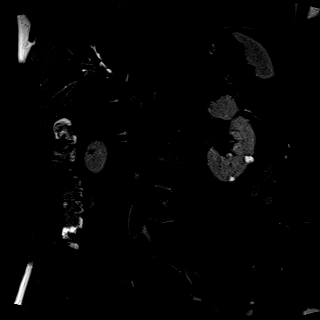

[Series 20: radials · coronal · 50.0mm · 0.78mm/px · 1 of 5 slices shown]
[im 1/5]
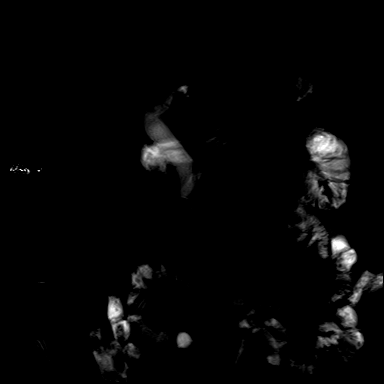

[Series 21: T1 dynamic fat-sat · axial · non-contrast · 3.0mm · 1.19mm/px · z∈[-61,+152]mm · 3 of 72 slices shown (1 of 5)]
[im 1/72]
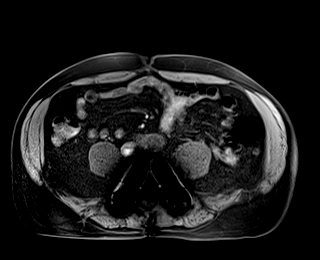
[im 36/72]
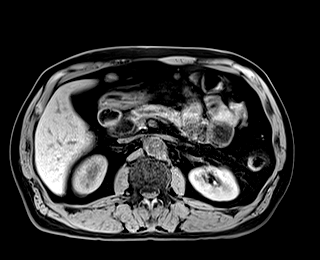
[im 72/72]
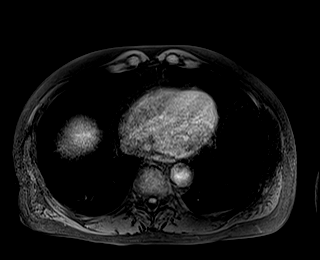

[Series 22: T1 dynamic fat-sat post-contrast · axial · 3.0mm · 1.19mm/px · z∈[-61,+152]mm · 3 of 72 slices shown (1 of 4)]
[im 1/72]
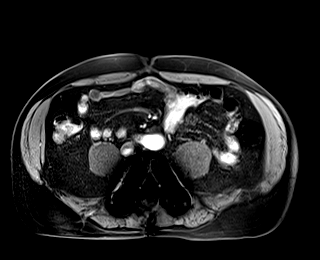
[im 36/72]
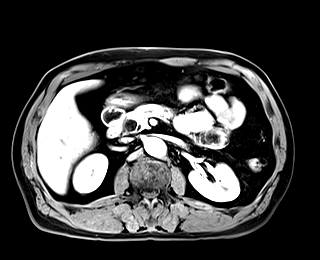
[im 72/72]
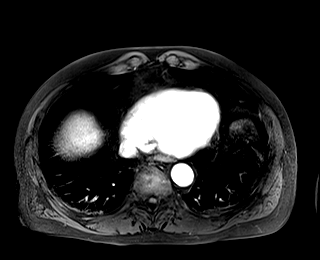

[Series 23: T1 dynamic fat-sat · axial · 3.0mm · 1.19mm/px · z∈[-61,+152]mm · 3 of 72 slices shown (2 of 5)]
[im 1/72]
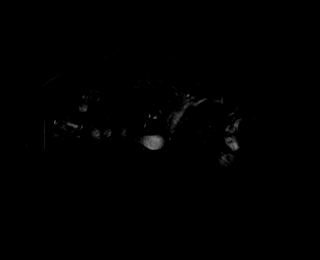
[im 36/72]
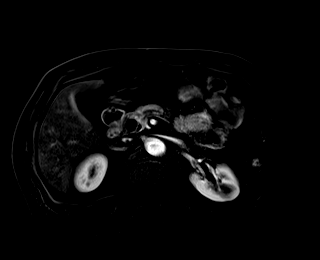
[im 72/72]
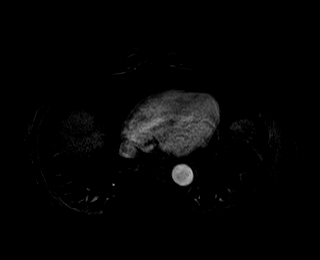

[Series 24: T1 dynamic fat-sat post-contrast · axial · 3.0mm · 1.19mm/px · z∈[-61,+152]mm · 3 of 72 slices shown (2 of 4)]
[im 1/72]
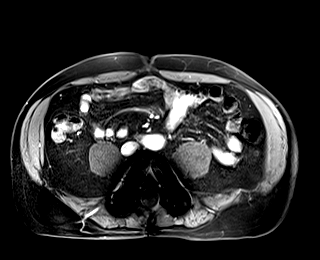
[im 36/72]
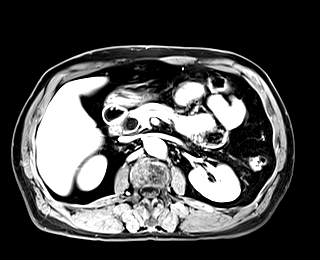
[im 72/72]
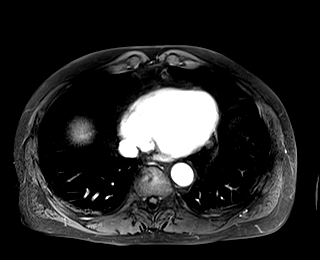

[Series 25: T1 dynamic fat-sat · axial · 3.0mm · 1.19mm/px · z∈[-61,+152]mm · 3 of 72 slices shown (3 of 5)]
[im 1/72]
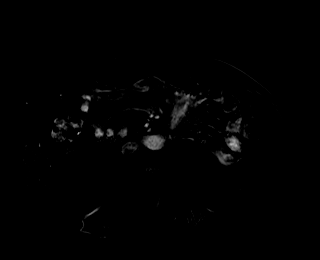
[im 36/72]
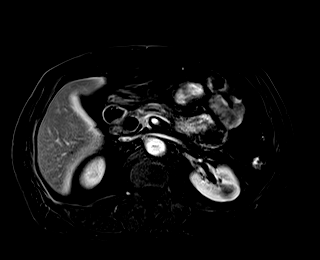
[im 72/72]
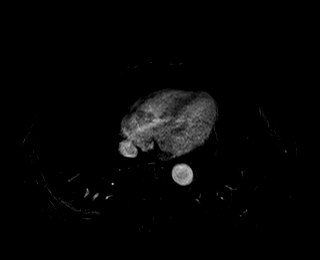

[Series 26: T1 dynamic fat-sat post-contrast · axial · 3.0mm · 1.19mm/px · z∈[-61,+152]mm · 3 of 72 slices shown (3 of 4)]
[im 1/72]
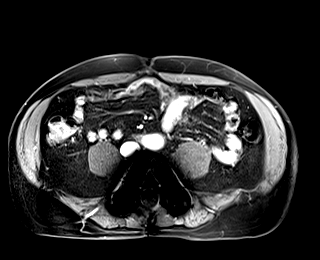
[im 36/72]
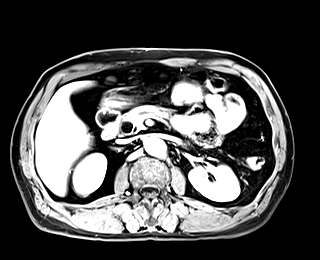
[im 72/72]
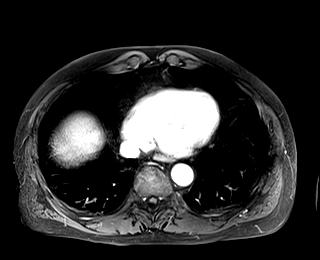

[Series 27: T1 dynamic fat-sat · axial · 3.0mm · 1.19mm/px · z∈[-61,+152]mm · 3 of 72 slices shown (4 of 5)]
[im 1/72]
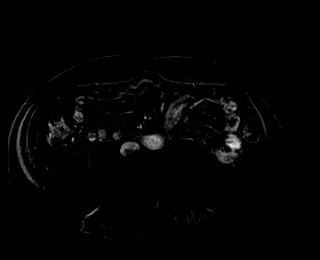
[im 36/72]
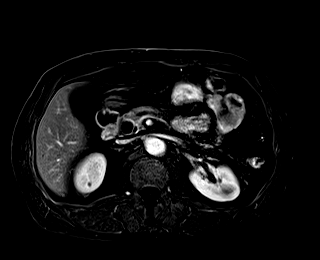
[im 72/72]
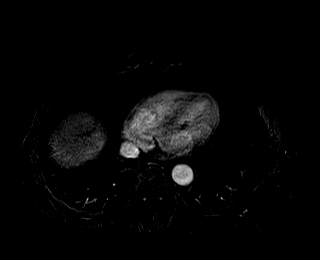

[Series 28: T1 dynamic post-contrast · coronal · 3.0mm · 1.31mm/px · 3 of 72 slices shown]
[im 1/72]
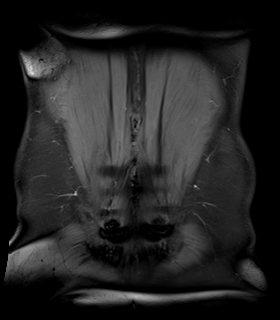
[im 36/72]
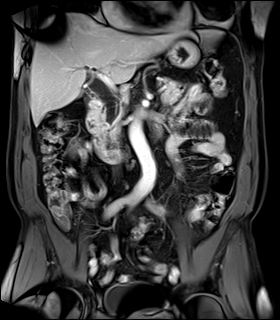
[im 72/72]
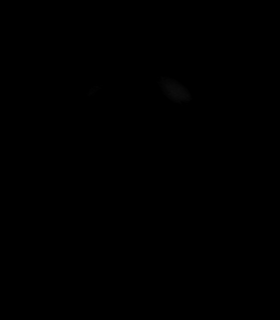

[Series 29: T1 dynamic fat-sat post-contrast · axial · 3.0mm · 1.19mm/px · z∈[-61,+152]mm · 3 of 72 slices shown (4 of 4)]
[im 1/72]
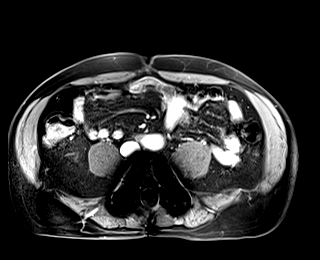
[im 36/72]
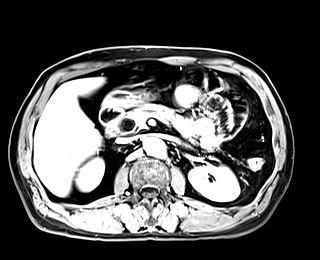
[im 72/72]
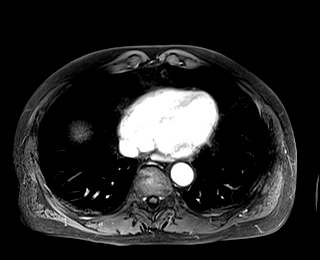

[Series 30: T1 dynamic fat-sat · axial · 3.0mm · 1.19mm/px · z∈[-61,+152]mm · 3 of 72 slices shown (5 of 5)]
[im 1/72]
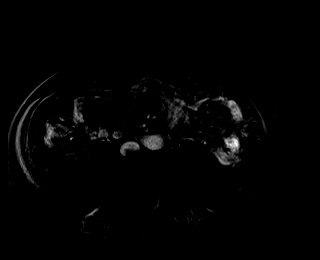
[im 36/72]
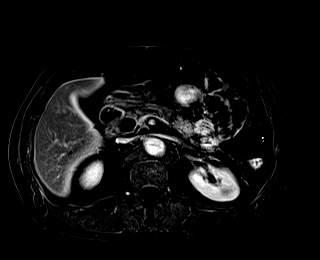
[im 72/72]
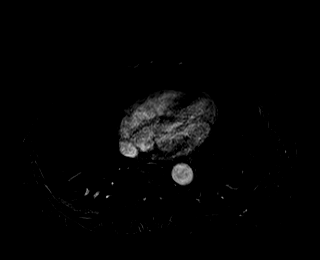

[45 of 48 positions shown; findings below may reference images not displayed]

FINDINGS: Lower chest: Unremarkable.

Hepatobiliary: No suspicious cystic or solid hepatic lesions.
Previously noted mild intrahepatic biliary ductal dilatation has
resolved. There continues to be moderate to severe extrahepatic
biliary ductal dilatation, with the common bile duct measuring up to
20 mm in diameter on today's examination. Previously noted filling
defect in the distal common bile duct has resolved on today's study.

Pancreas: No pancreatic mass. No pancreatic ductal dilatation. No
pancreatic or peripancreatic fluid collections or inflammatory
changes.

Spleen:  Unremarkable.

Adrenals/Urinary Tract: In the kidneys bilaterally there are
multiple T1 hypointense, T2 hyperintense, nonenhancing lesions,
compatible with Bosniak class 1 simple cysts, largest of which is in
the interpolar region of the left kidney measuring up to 2.4 cm in
diameter. In addition, in the anterior aspect of the interpolar
region of the right kidney there is a 5 mm T1 hyperintense, T2
hypointense, nonenhancing lesion, compatible with a
proteinaceous/hemorrhagic cyst (Bosniak class 2). No
hydroureteronephrosis in the visualized portions of the abdomen.
Bilateral adrenal glands are normal in appearance.

Stomach/Bowel: Visualized portions are unremarkable.

Vascular/Lymphatic: Aortic atherosclerosis, without evidence of
aneurysm in the abdominal vasculature. No lymphadenopathy noted in
the abdomen.

Other: No significant volume of ascites noted in the visualized
portions of the peritoneal cavity.

Musculoskeletal: Signal void in the lumbar spine related to
indwelling spinal hardware. No aggressive appearing osseous lesions
are noted in the visualized portions of the skeleton.
IMPRESSION: 1. Previously noted filling defect in the distal common bile duct is
no longer evident, suggesting passage of prior choledocholithiasis.
There continues to be some moderate to severe extrahepatic biliary
ductal dilatation with the common bile duct measuring up to 20 mm in
diameter, however, this is likely reflective of benign post
cholecystectomy physiology in this patient. No residual intrahepatic
biliary ductal dilatation.
2. Multiple small Bosniak class 1 and Bosniak class 2 renal cysts,
as above.

## 2019-11-25 MED ORDER — ONDANSETRON 4 MG PO TBDP
4.0000 mg | ORAL_TABLET | Freq: Three times a day (TID) | ORAL | 0 refills | Status: DC | PRN
Start: 2019-11-25 — End: 2020-03-16

## 2019-11-25 MED ORDER — GADOBUTROL 1 MMOL/ML IV SOLN
7.5000 mL | Freq: Once | INTRAVENOUS | Status: AC | PRN
  Administered 2019-11-25: 7.5 mL via INTRAVENOUS

## 2019-11-25 MED ORDER — OXYCODONE-ACETAMINOPHEN 5-325 MG PO TABS
1.0000 | ORAL_TABLET | Freq: Once | ORAL | Status: AC
  Administered 2019-11-25: 1 via ORAL
  Filled 2019-11-25: qty 1

## 2019-11-25 NOTE — ED Notes (Signed)
PT requesting dilaudid for pain. MD made aware.

## 2019-11-25 NOTE — ED Notes (Signed)
Pt with 1 episode urine, no BM

## 2019-11-25 NOTE — ED Notes (Signed)
Patient transported to MRI 

## 2019-11-25 NOTE — Discharge Instructions (Addendum)
1.  You may take the Percocet you have at home for pain.  You may take Zofran as needed for nausea. 2.  Bland/BRAT diet x3 days, then slowly advance diet as tolerated. 3.  Return to the ER for worsening symptoms, persistent vomiting, difficulty breathing or other concerns.

## 2019-12-05 ENCOUNTER — Encounter: Payer: Self-pay | Admitting: Gastroenterology

## 2019-12-05 ENCOUNTER — Ambulatory Visit: Payer: Medicare Other | Admitting: Gastroenterology

## 2020-03-16 ENCOUNTER — Other Ambulatory Visit: Payer: Self-pay

## 2020-03-16 ENCOUNTER — Ambulatory Visit
Admission: EM | Admit: 2020-03-16 | Discharge: 2020-03-16 | Disposition: A | Discharge status: Home / Self Care | Payer: Medicare Other | Attending: Emergency Medicine | Admitting: Emergency Medicine

## 2020-03-16 ENCOUNTER — Ambulatory Visit (INDEPENDENT_AMBULATORY_CARE_PROVIDER_SITE_OTHER): Payer: Medicare Other

## 2020-03-16 DIAGNOSIS — M899 Disorder of bone, unspecified: Secondary | ICD-10-CM

## 2020-03-16 DIAGNOSIS — M546 Pain in thoracic spine: Secondary | ICD-10-CM | POA: Diagnosis not present

## 2020-03-16 DIAGNOSIS — M549 Dorsalgia, unspecified: Secondary | ICD-10-CM | POA: Diagnosis not present

## 2020-03-16 IMAGING — CR DG THORACIC SPINE 2V
5 series · 5 of 5 positions shown · non-contrast
Comparison: None.

CLINICAL DATA: Back pain and bony protrusion from back.

EXAM:
THORACIC SPINE 2 VIEWS

[t-spine ap (1 of 2)]
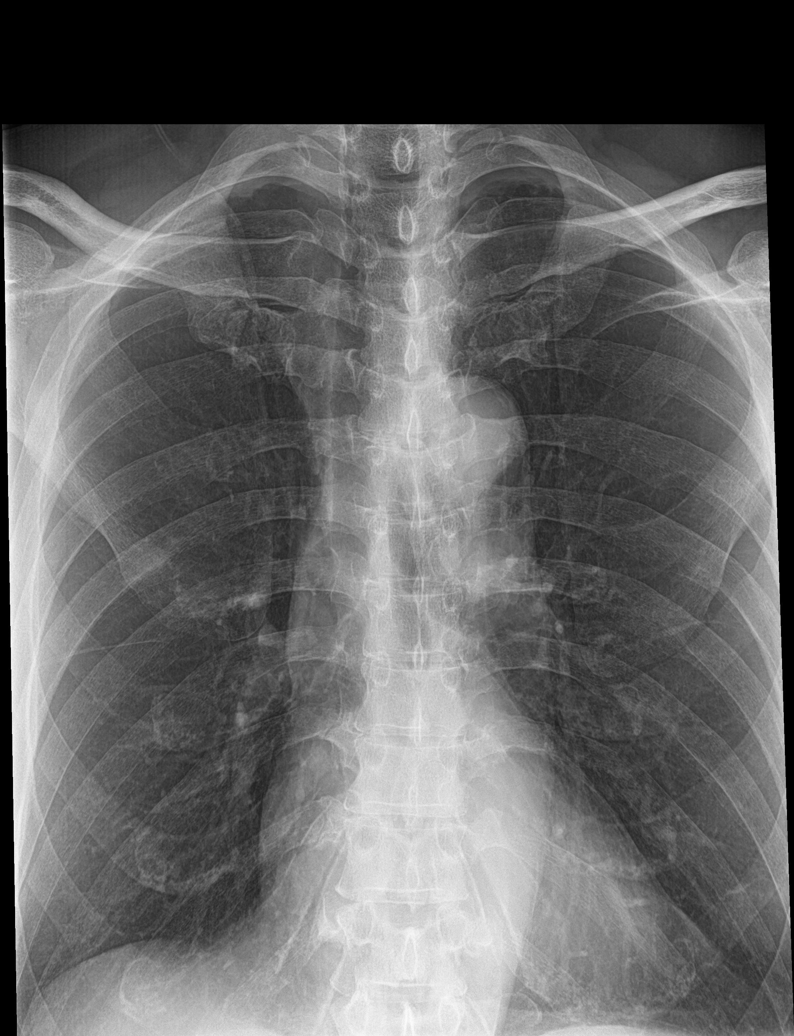

[t-spine lat (1 of 2)]
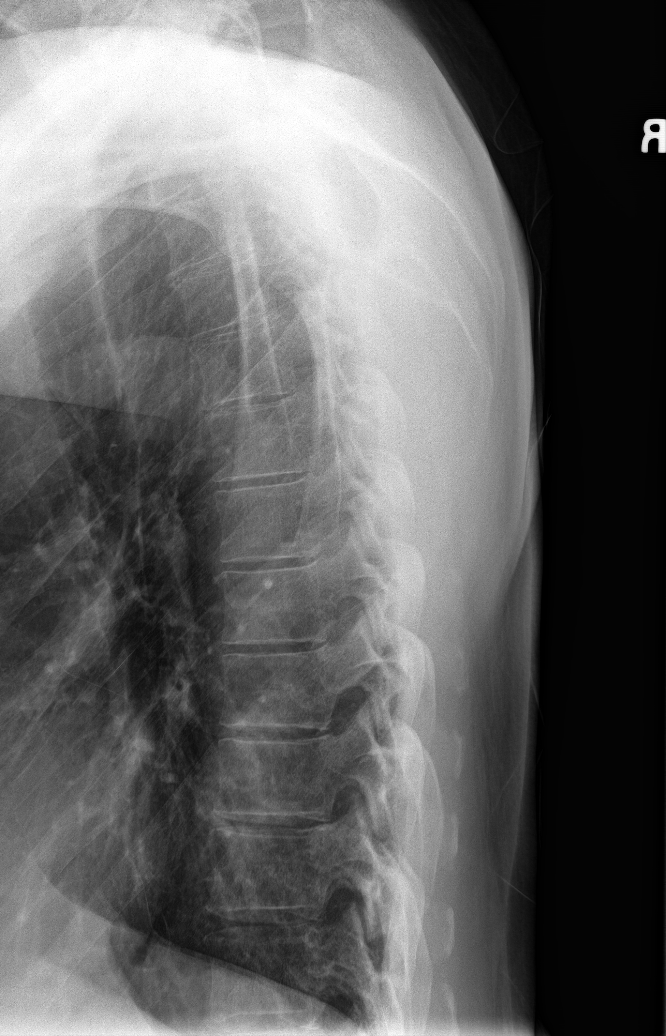

[t-spine swimmers]
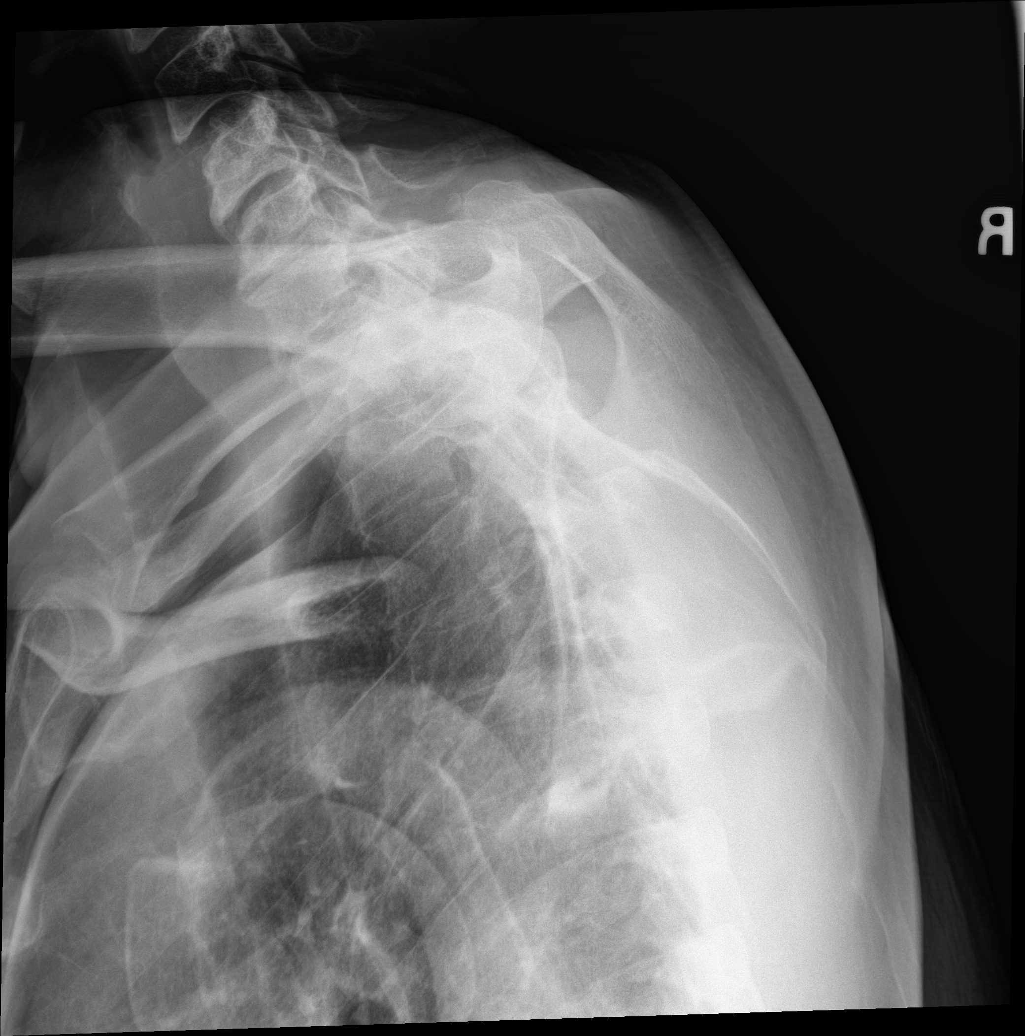

[t-spine ap (2 of 2)]
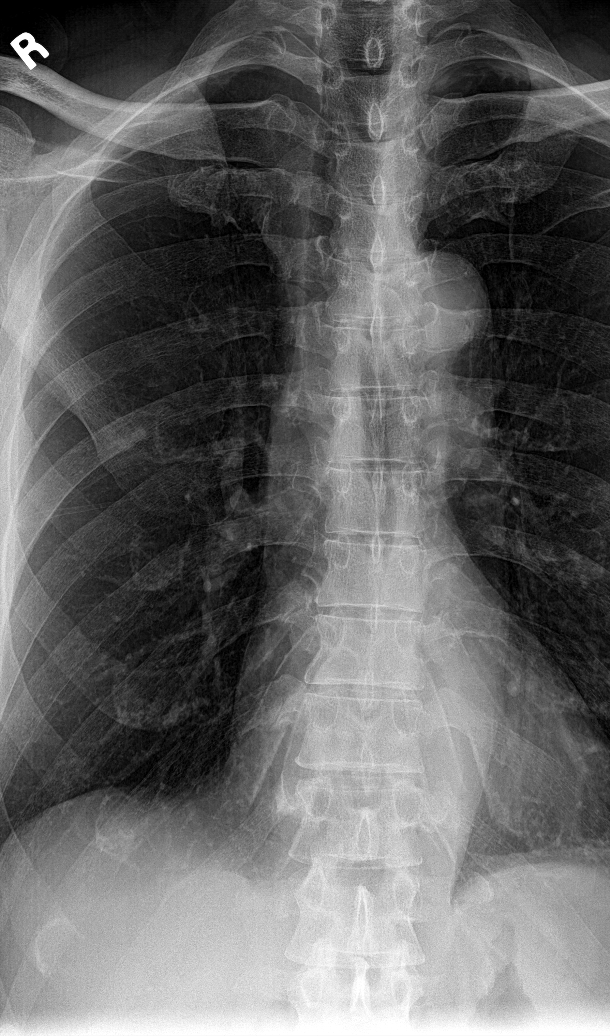

[t-spine lat (2 of 2)]
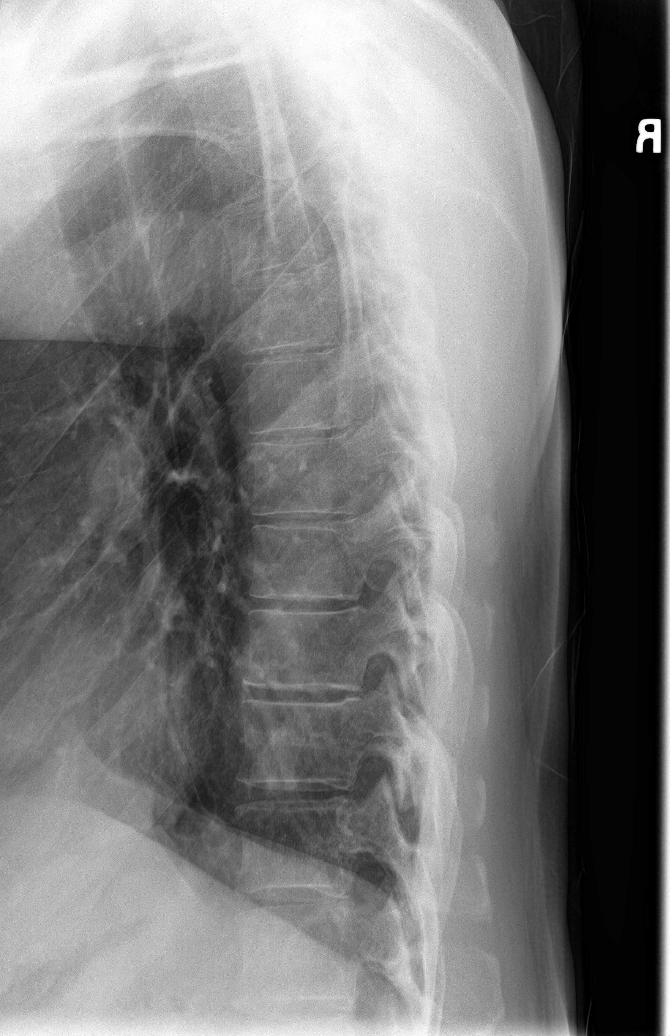

[5 of 5 positions shown; findings below may reference images not displayed]

FINDINGS: There is no evidence of thoracic spine fracture. Alignment is
normal. No bony lesions identified. No significant degenerative
disease.
IMPRESSION: Negative.

## 2020-03-16 NOTE — Discharge Instructions (Addendum)
The x-rays did not show any bony deformity.  Your disc spaces look normal but x-rays do not show disc bulging.  Follow-up with the [facility] on the 15th as scheduled.  Because you are in [facility] spine clinic they will need to manage pain medicine.

## 2020-03-16 NOTE — ED Provider Notes (Signed)
[facility]    CSN: [id number] Arrival date & time: 03/16/20  1037      History   Chief Complaint Chief Complaint  Patient presents with   Knee Pain   Back Pain    HPI Earl Murray is a 64 y.o. male.   64 year old gentleman here for evaluation of a bulge from his spine that he first noticed after moving a couch yesterday.  He reports that he felt a pull and a pop and then this bulge developed.  He denies numbness or tingling in his legs.  Patient has a significant orthopedic history including lumbar laminectomy.  The bulge is right above his lumbar scar.  He is currently in a pain management clinic with [facility] and is set to see a new provider on November 15.  His last prescription was on September 29 for 100 Tylenol 3's, he received 20 T3 on September 28, and picked up 120 T3's on August 29.     Past Medical History:  Diagnosis Date   Hypertension    Neck injury     Patient Active Problem List   Diagnosis Date Noted   Abnormal magnetic resonance imaging of liver    Choledocholithiasis 10/25/2019   Hypertension    Hallux valgus, acquired 04/20/2016   Hammer toes of both feet 04/20/2016   Stress reaction 04/20/2016    Past Surgical History:  Procedure Laterality Date   BACK SURGERY     CHOLECYSTECTOMY     ELBOW SURGERY     ERCP N/A 10/27/2019   Procedure: ENDOSCOPIC RETROGRADE CHOLANGIOPANCREATOGRAPHY (ERCP);  Surgeon: Lucilla Lame, MD;  Location: [facility] ENDOSCOPY;  Service: Endoscopy;  Laterality: N/A;   FOOT SURGERY     HERNIA REPAIR     4 hernia repairs       Home Medications    Prior to Admission medications   Medication Sig Start Date End Date Taking? Authorizing Provider  amLODipine (NORVASC) 10 MG tablet Take 10 mg by mouth daily.    Yes [provider]  baclofen (LIORESAL) 10 MG tablet Take 1 tablet (10 mg total) by mouth 3 (three) times daily as needed (Back pain). 03/04/19  Yes Cook, Jayce G, DO   hydrochlorothiazide (HYDRODIURIL) 25 MG tablet Take 1 tablet (25 mg total) by mouth daily. 08/15/18  Yes Cook, Jayce G, DO  gabapentin (NEURONTIN) 300 MG capsule gabapentin 300 mg capsule  one tab bid-tid  03/01/19  [provider]  ipratropium (ATROVENT) 0.06 % nasal spray Place 2 sprays into both nostrils 4 (four) times daily as needed for rhinitis. 10/22/19 03/16/20  Karen Kitchens, NP  levocetirizine (XYZAL) 5 MG tablet Take 1 tablet (5 mg total) by mouth every evening. 09/30/19 03/16/20  Coral Spikes, DO    Family History Family History  Problem Relation Age of Onset   Hypertension Mother    Heart disease Mother    Colon cancer Father     Social History Social History   Tobacco Use   Smoking status: Former Smoker    Types: Cigarettes    Quit date: 05/2018    Years since quitting: 1.8   Smokeless tobacco: Former Counsellor Use: Never used  Substance Use Topics   Alcohol use: Not Currently   Drug use: Not Currently     Allergies   Tylenol with codeine #3 [acetaminophen-codeine], Motrin [ibuprofen], and Tramadol   Review of Systems Review of Systems  Constitutional: Negative for activity change, appetite change and  fever.  HENT: Negative for congestion and sinus pain.   Respiratory: Negative for cough and wheezing.   Cardiovascular: Negative for chest pain.  Gastrointestinal: Negative for nausea and vomiting.  Genitourinary: Negative for dysuria and frequency.       No incontinence.  Musculoskeletal: Positive for arthralgias and back pain. Negative for myalgias.       Patient complaining of pain in both knees but this is a longstanding problem that he is already being evaluated by [facility] for.  He is most concerned about a new bulge that has shown up in his back after moving a couch yesterday.  Skin: Negative for rash.  Neurological: Negative for weakness and numbness.  Hematological: Negative.   Psychiatric/Behavioral: Negative.       Physical Exam Triage Vital Signs ED Triage Vitals  Enc Vitals Group     BP 03/16/20 1048 (!) 149/97     Pulse Rate 03/16/20 1048 84     Resp 03/16/20 1048 18     Temp 03/16/20 1048 98.7 F (37.1 C)     Temp Source 03/16/20 1048 Oral     SpO2 03/16/20 1048 97 %     Weight 03/16/20 1047 200 lb (90.7 kg)     Height 03/16/20 1047 6\' 5"  (1.956 m)     Head Circumference --      Peak Flow --      Pain Score 03/16/20 1046 10     Pain Loc --      Pain Edu? --      Excl. in [place]? --    No data found.  Updated Vital Signs BP (!) 149/97 (BP Location: Left Arm)    Pulse 84    Temp 98.7 F (37.1 C) (Oral)    Resp 18    Ht 6\' 5"  (1.956 m)    Wt 200 lb (90.7 kg)    SpO2 97%    BMI 23.72 kg/m   Visual Acuity Right Eye Distance:   Left Eye Distance:   Bilateral Distance:    Right Eye Near:   Left Eye Near:    Bilateral Near:     Physical Exam Vitals and nursing note reviewed.  Constitutional:      General: He is not in acute distress.    Appearance: Normal appearance. He is normal weight. He is not toxic-appearing.  HENT:     Head: Normocephalic and atraumatic.  Eyes:     General: No scleral icterus.    Extraocular Movements: Extraocular movements intact.     Conjunctiva/sclera: Conjunctivae normal.     Pupils: Pupils are equal, round, and reactive to light.  Pulmonary:     Effort: Pulmonary effort is normal.  Musculoskeletal:        General: Tenderness and deformity present. Normal range of motion.     Comments: There is a large protrusion from the lower thoracic spine just above an old lumbar laminectomy scar.  The bulge is tender to palpation, is not freely mobile, and there is no associated paraspinous spasm or tenderness.  Skin:    General: Skin is warm and dry.     Capillary Refill: Capillary refill takes less than 2 seconds.     Findings: No bruising or erythema.  Neurological:     General: No focal deficit present.     Mental Status: He is alert and oriented to  person, place, and time.     Sensory: No sensory deficit.     Motor: No weakness.  Coordination: Coordination normal.     Gait: Gait normal.  Psychiatric:        Mood and Affect: Mood normal.        Behavior: Behavior normal.        Thought Content: Thought content normal.        Judgment: Judgment normal.      UC Treatments / Results  Labs (all labs ordered are listed, but only abnormal results are displayed) Labs Reviewed - No data to display  EKG   Radiology DG Thoracic Spine 2 View  Result Date: 03/16/2020 CLINICAL DATA:  Back pain and bony protrusion from back. EXAM: THORACIC SPINE 2 VIEWS COMPARISON:  None. FINDINGS: There is no evidence of thoracic spine fracture. Alignment is normal. No bony lesions identified. No significant degenerative disease. IMPRESSION: Negative. Electronically Signed   By: Aletta Edouard M.D.   On: 03/16/2020 12:04    Procedures Procedures (including critical care time)  Medications Ordered in UC Medications - No data to display  Initial Impression / Assessment and Plan / UC Course  I have reviewed the triage vital signs and the nursing notes.  Pertinent labs & imaging results that were available during my care of the patient were reviewed by me and considered in my medical decision making (see chart for details).   Patient is here for evaluation of a bulge in his back that he reports developed yesterday after moving a couch.  He states he felt a pull and a pop and then this bulge was there.  He denies numbness or tingling in his legs, no loss of bowel or bladder control, no gait instability or weakness.  Patient is in the pain clinic with [facility] and he has been receiving Tylenol 3 monthly.  Last prescription was February 21, 2020.  Patient reports that he is set to see a new doctor at the pain clinic but does not have an appointment until 04/08/2020.  We will obtain thoracic spine films to look for bony displacement or acute  process.  If negative will refer patient back to [facility] for pain management and further evaluation.  Radiology review is negative for bony abnormality.  The patient home to follow-up with [facility] on November 15 as scheduled.   Final Clinical Impressions(s) / UC Diagnoses   Final diagnoses:  Thoracic back pain, unspecified back pain laterality, unspecified chronicity     Discharge Instructions     The x-rays did not show any bony deformity.  Your disc spaces look normal but x-rays do not show disc bulging.  Follow-up with the [facility] on the 15th as scheduled.  Because you are in [facility] spine clinic they will need to manage pain medicine.    ED Prescriptions    None     I have reviewed the PDMP during this encounter.   Margarette Canada, NP 03/16/20 1224

## 2020-03-16 NOTE — ED Triage Notes (Signed)
Patient complains of bilateral knee pain that has been ongoing. Patient states that he had some "gel shots" last month with no improvement.  Patient also complains of low back pain that worsened after moving furniture. States that when he bends over he sees a bulge sticking out of his back and is concerned for a disc issue, 3 prior back surgeries.

## 2020-04-06 ENCOUNTER — Other Ambulatory Visit: Payer: Self-pay

## 2020-04-06 ENCOUNTER — Emergency Department: Payer: Medicare Other

## 2020-04-06 ENCOUNTER — Encounter: Payer: Self-pay | Admitting: Emergency Medicine

## 2020-04-06 ENCOUNTER — Emergency Department
Admission: EM | Admit: 2020-04-06 | Discharge: 2020-04-07 | Disposition: A | Discharge status: Home / Self Care | Payer: Medicare Other | Attending: Emergency Medicine | Admitting: Emergency Medicine

## 2020-04-06 DIAGNOSIS — R1030 Lower abdominal pain, unspecified: Secondary | ICD-10-CM | POA: Diagnosis present

## 2020-04-06 DIAGNOSIS — Z79899 Other long term (current) drug therapy: Secondary | ICD-10-CM | POA: Diagnosis not present

## 2020-04-06 DIAGNOSIS — R1084 Generalized abdominal pain: Secondary | ICD-10-CM

## 2020-04-06 DIAGNOSIS — R000 Tachycardia, unspecified: Secondary | ICD-10-CM | POA: Diagnosis not present

## 2020-04-06 DIAGNOSIS — Z87891 Personal history of nicotine dependence: Secondary | ICD-10-CM | POA: Insufficient documentation

## 2020-04-06 DIAGNOSIS — I10 Essential (primary) hypertension: Secondary | ICD-10-CM | POA: Insufficient documentation

## 2020-04-06 LAB — URINALYSIS, COMPLETE (UACMP) WITH MICROSCOPIC
Bacteria, UA: NONE SEEN
Bilirubin Urine: NEGATIVE
Glucose, UA: NEGATIVE mg/dL
Hgb urine dipstick: NEGATIVE
Ketones, ur: NEGATIVE mg/dL
Leukocytes,Ua: NEGATIVE
Nitrite: NEGATIVE
Protein, ur: 30 mg/dL — AB
Specific Gravity, Urine: 1.036 — ABNORMAL HIGH (ref 1.005–1.030)
pH: 5 (ref 5.0–8.0)

## 2020-04-06 LAB — COMPREHENSIVE METABOLIC PANEL
ALT: 15 U/L (ref 0–44)
AST: 22 U/L (ref 15–41)
Albumin: 4.3 g/dL (ref 3.5–5.0)
Alkaline Phosphatase: 77 U/L (ref 38–126)
Anion gap: 9 (ref 5–15)
BUN: 14 mg/dL (ref 8–23)
CO2: 29 mmol/L (ref 22–32)
Calcium: 9.7 mg/dL (ref 8.9–10.3)
Chloride: 99 mmol/L (ref 98–111)
Creatinine, Ser: 1.57 mg/dL — ABNORMAL HIGH (ref 0.61–1.24)
GFR, Estimated: 49 mL/min — ABNORMAL LOW (ref >60)
Glucose, Bld: 106 mg/dL — ABNORMAL HIGH (ref 70–99)
Potassium: 3.5 mmol/L (ref 3.5–5.1)
Sodium: 137 mmol/L (ref 135–145)
Total Bilirubin: 1.1 mg/dL (ref 0.3–1.2)
Total Protein: 8.1 g/dL (ref 6.5–8.1)

## 2020-04-06 LAB — CBC
HCT: 44.4 % (ref 39.0–52.0)
Hemoglobin: 14.9 g/dL (ref 13.0–17.0)
MCH: 30.8 pg (ref 26.0–34.0)
MCHC: 33.6 g/dL (ref 30.0–36.0)
MCV: 91.9 fL (ref 80.0–100.0)
Platelets: 281 10*3/uL (ref 150–400)
RBC: 4.83 MIL/uL (ref 4.22–5.81)
RDW: 15.5 % (ref 11.5–15.5)
WBC: 13.5 10*3/uL — ABNORMAL HIGH (ref 4.0–10.5)
nRBC: 0 % (ref 0.0–0.2)

## 2020-04-06 LAB — LIPASE, BLOOD: Lipase: 30 U/L (ref 11–51)

## 2020-04-06 IMAGING — CT CT ABD-PELV W/ CM
2 of 5 series · 15 of 46 positions shown, 17 images · IV contrast (APPLIED)
Comparison: [DATE], MRI [DATE]

CLINICAL DATA: Lower abdominal pain

EXAM:
CT ABDOMEN AND PELVIS WITH CONTRAST
TECHNIQUE: Multidetector CT imaging of the abdomen and pelvis was performed
using the standard protocol following bolus administration of
intravenous contrast.
CONTRAST:  100mL OMNIPAQUE IOHEXOL 300 MG/ML  SOLN

[Series 2: axial st · axial · 0.79mm/px · z∈[-944,-529]mm · 12 of 95 slices shown, 14 images]
[im 6/95  soft-tissue]
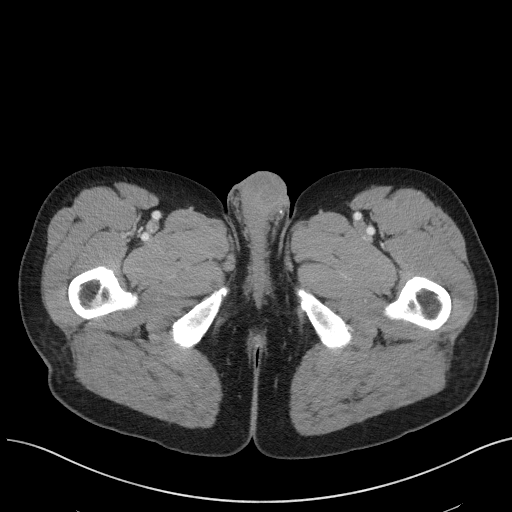
[im 6/95  bone]
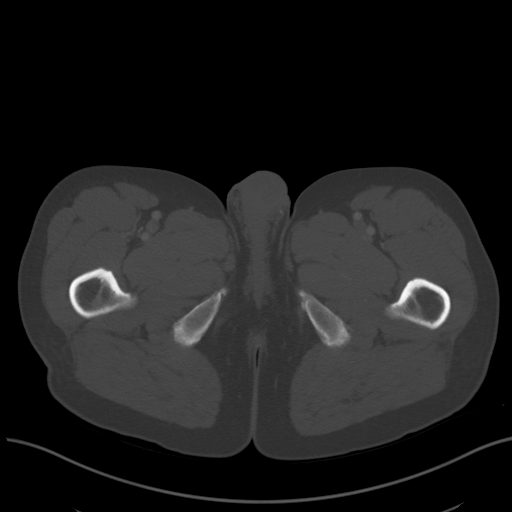
[im 16/95  soft-tissue]
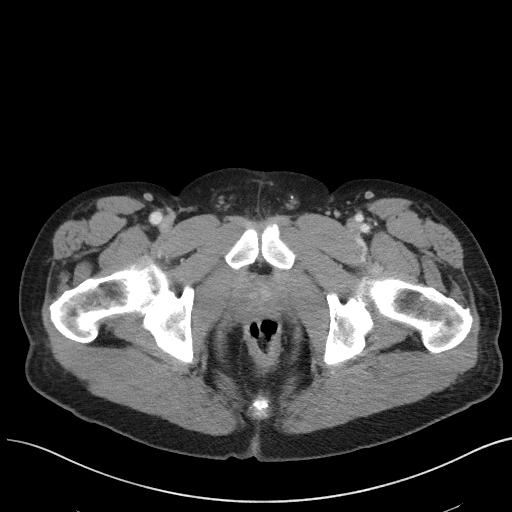
[im 21/95  soft-tissue]
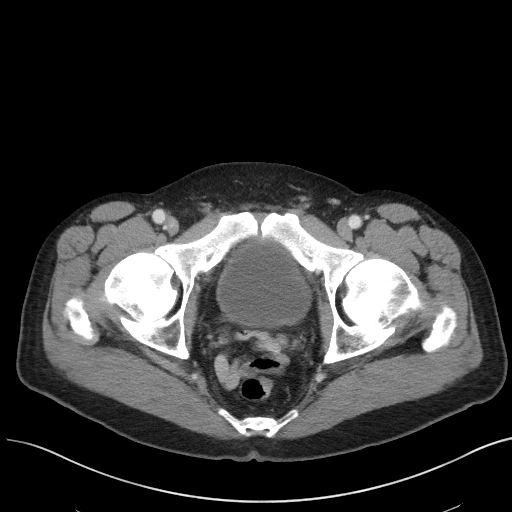
[im 27/95  soft-tissue]
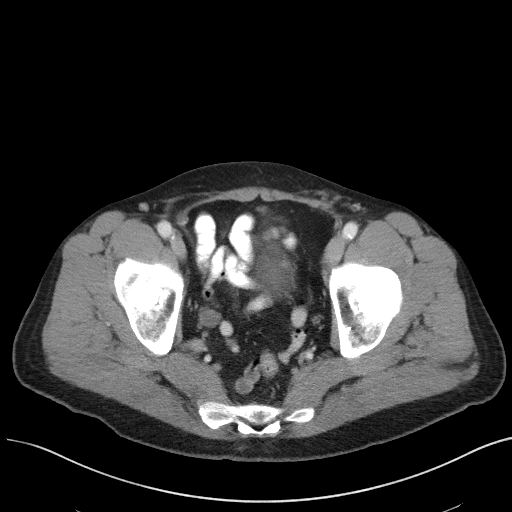
[im 37/95  soft-tissue]
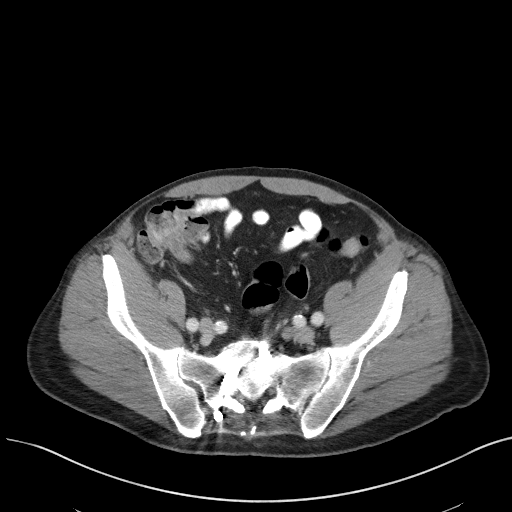
[im 42/95  soft-tissue]
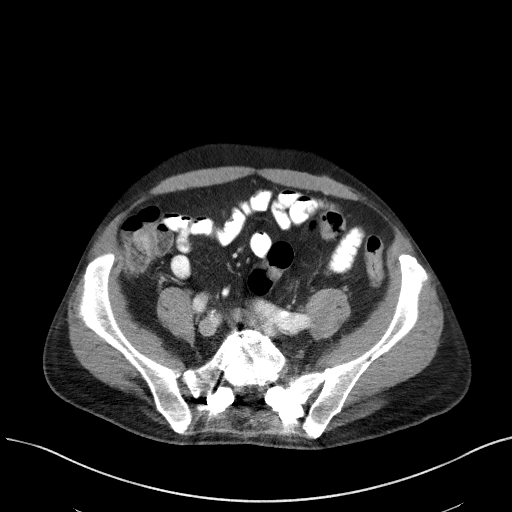
[im 53/95  soft-tissue]
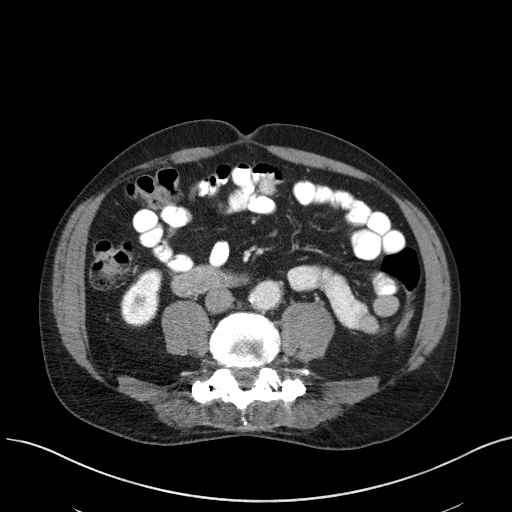
[im 58/95  soft-tissue]
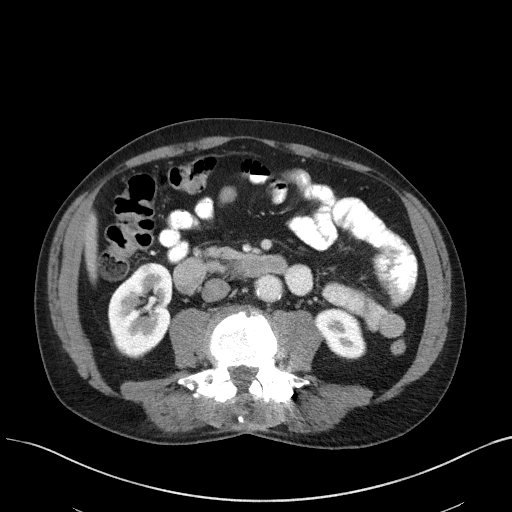
[im 68/95  soft-tissue]
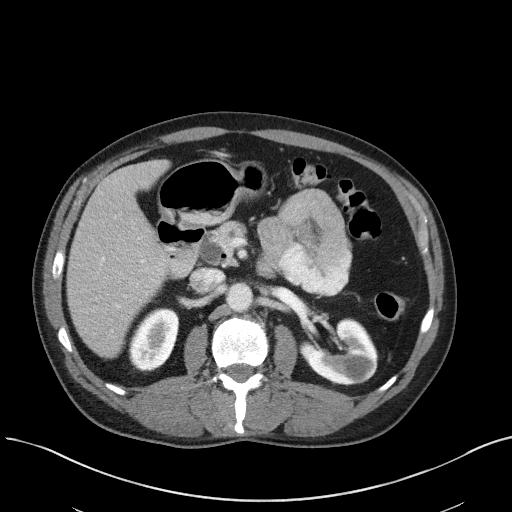
[im 68/95  bone]
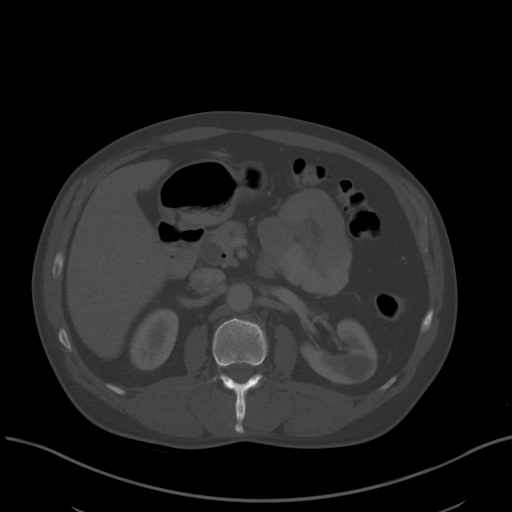
[im 74/95  soft-tissue]
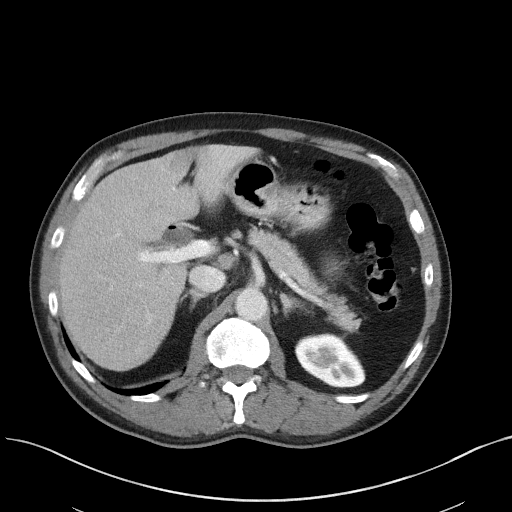
[im 79/95  soft-tissue]
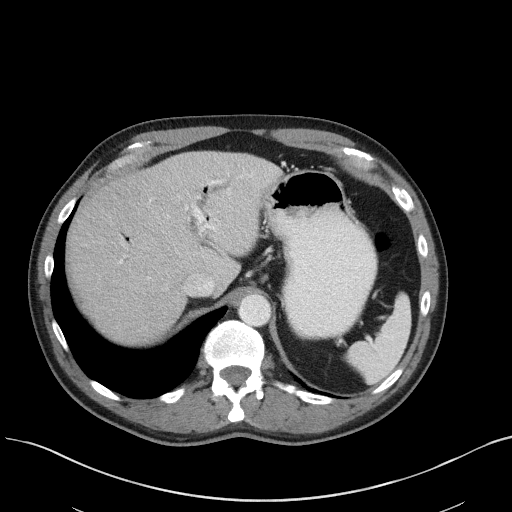
[im 89/95  soft-tissue]
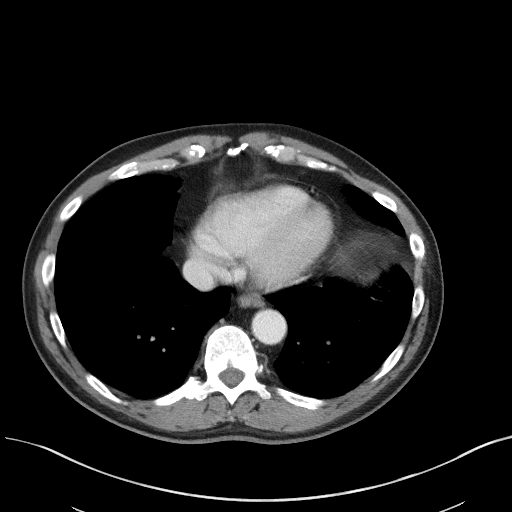

[Series 5: coronal st · coronal · 0.66mm/px · 3 of 92 slices shown]
[im 31/92  soft-tissue]
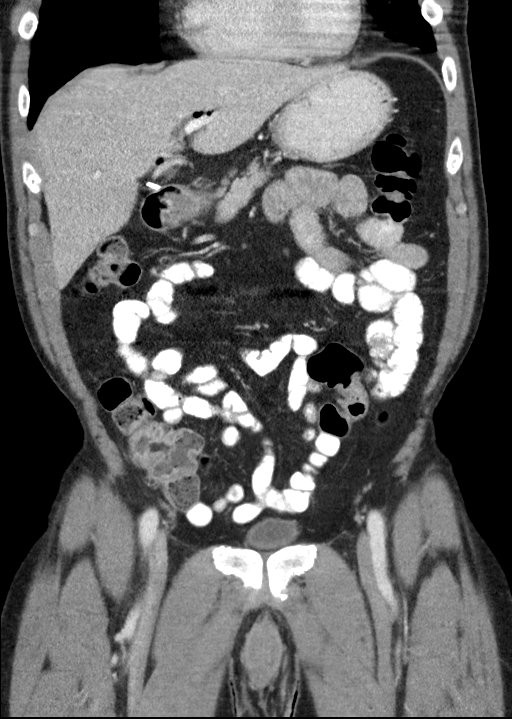
[im 41/92  soft-tissue]
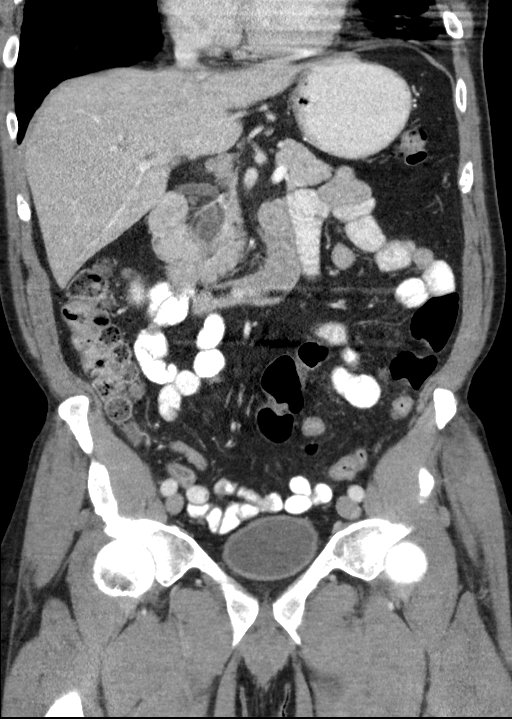
[im 51/92  soft-tissue]
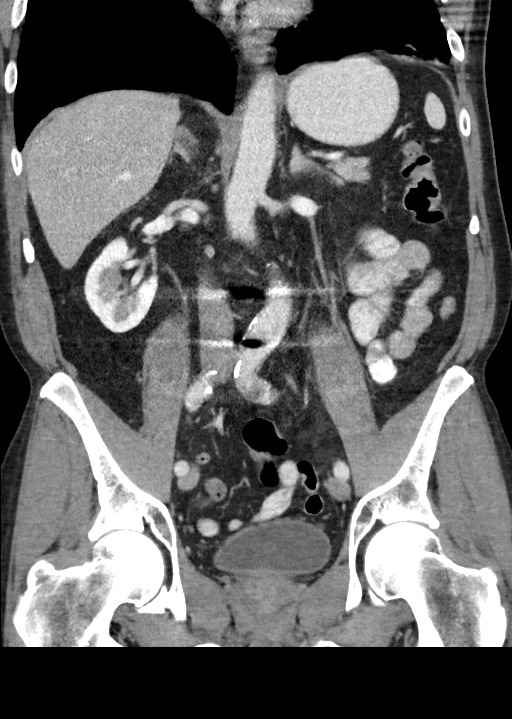

[15 of 46 positions shown; findings below may reference images not displayed]

FINDINGS: Lower chest: The visualized lung bases are clear. Visualized heart
and pericardium are unremarkable.

Hepatobiliary: Mild pneumobilia is present compatible with probable
prior sphincterotomy. Marked extrahepatic biliary ductal dilation is
again identified, stable since prior examination. Cholecystectomy
has been performed. Liver is otherwise unremarkable.

Pancreas: Unremarkable

Spleen: Unremarkable

Adrenals/Urinary Tract: The adrenal glands are unremarkable. The
kidneys are normal in size and position. 5 mm proteinaceous cyst
noted within the a lower pole of the right kidney, better
characterized on prior MRI examination of [DATE]. Multiple
simple cortical cyst noted within the lower pole of the left kidney.
The kidneys are otherwise unremarkable. Bladder unremarkable peer

Stomach/Bowel: Stomach is within normal limits. Appendix appears
normal. No evidence of bowel wall thickening, distention, or
inflammatory changes. No free intraperitoneal gas or fluid.

Vascular/Lymphatic: Mild aortoiliac atherosclerotic calcification.
No aortic aneurysm. No pathologic adenopathy within the abdomen and
pelvis.

Reproductive: Prostate is unremarkable.

Other: Rectum unremarkable

Musculoskeletal: L3-S1 posterior lumbar fusion with instrumentation
with resection of the spinous processes of L4 and L5 has been
performed. Degenerative changes are noted throughout the remainder
of the lumbar spine. No acute bone abnormality.
IMPRESSION: No radiographic explanation for the patient's reported abdominal
pain.

Stable marked extrahepatic biliary ductal dilation, possibly
representing post cholecystectomy change. Pneumobilia noted, new
since prior examination, likely result of interval sphincterotomy.

Aortic Atherosclerosis ([OJ]-[OJ]).

## 2020-04-06 MED ORDER — ONDANSETRON HCL 4 MG/2ML IJ SOLN
4.0000 mg | Freq: Once | INTRAMUSCULAR | Status: AC
  Administered 2020-04-06: 4 mg via INTRAVENOUS
  Filled 2020-04-06: qty 2

## 2020-04-06 MED ORDER — SODIUM CHLORIDE 0.9 % IV BOLUS
1000.0000 mL | Freq: Once | INTRAVENOUS | Status: AC
  Administered 2020-04-06: 1000 mL via INTRAVENOUS

## 2020-04-06 MED ORDER — HYDROMORPHONE HCL 1 MG/ML IJ SOLN
0.5000 mg | Freq: Once | INTRAMUSCULAR | Status: AC
  Administered 2020-04-06: 0.5 mg via INTRAVENOUS
  Filled 2020-04-06: qty 1

## 2020-04-06 MED ORDER — IOHEXOL 300 MG/ML  SOLN
100.0000 mL | Freq: Once | INTRAMUSCULAR | Status: AC | PRN
  Administered 2020-04-06: 100 mL via INTRAVENOUS

## 2020-04-06 MED ORDER — DICYCLOMINE HCL 10 MG PO CAPS
10.0000 mg | ORAL_CAPSULE | Freq: Once | ORAL | Status: AC
  Administered 2020-04-07: 10 mg via ORAL
  Filled 2020-04-06: qty 1

## 2020-04-06 MED ORDER — IOHEXOL 9 MG/ML PO SOLN
500.0000 mL | Freq: Two times a day (BID) | ORAL | Status: DC | PRN
  Administered 2020-04-06 (×2): 500 mL via ORAL

## 2020-04-06 NOTE — ED Triage Notes (Signed)
FIRST NURSE NOTE: Pt reports "stomach pain" states might be his pancreas. Pt is alert and oriented on arrival. No distress noted.

## 2020-04-06 NOTE — ED Notes (Signed)
Pt unable to void after triage.

## 2020-04-06 NOTE — Discharge Instructions (Signed)
Please return for worsening pain, fever or vomiting.  Please follow-up with Dr. Allen Norris, the gastroenterologist who saw you earlier.  Give his office a call and they should get it to get you in fairly quickly.

## 2020-04-06 NOTE — ED Provider Notes (Signed)
[facility] Emergency Department Provider Note   ____________________________________________   First MD Initiated Contact with Patient 04/06/20 2058     (approximate)  I have reviewed the triage vital signs and the nursing notes.   HISTORY  Chief Complaint Abdominal Pain    HPI Earl Murray is a 64 y.o. male patient reports some lower abdominal pain and upper abdominal pain as well as started on Monday.  Seems to be getting worse.  It is sharp and stabbing.  He does not have nausea vomiting or diarrhea.  His last stool was today.  He said he had something previously that they thought was his gallbladder and indeed he did have an admission in June for choledocholithiasis.  His lab work is normal except for a white count is elevated and GFR which is 49 normally his GFR is greater than 60.  Additionally he is tachycardic with a pulse rate of 121.        Past Medical History:  Diagnosis Date  . Hypertension   . Neck injury     Patient Active Problem List   Diagnosis Date Noted  . Abnormal magnetic resonance imaging of liver   . Choledocholithiasis 10/25/2019  . Hypertension   . Hallux valgus, acquired 04/20/2016  . 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 Stress reaction 04/20/2016    Past Surgical History:  Procedure Laterality Date  . BACK SURGERY    . CHOLECYSTECTOMY    . ELBOW SURGERY    . ERCP N/A 10/27/2019   Procedure: ENDOSCOPIC RETROGRADE CHOLANGIOPANCREATOGRAPHY (ERCP);  Surgeon: Lucilla Lame, MD;  Location: [facility];  Service: Endoscopy;  Laterality: N/A;  . FOOT SURGERY    . HERNIA REPAIR     4 hernia repairs    Prior to Admission medications   Medication Sig Start Date End Date Taking? Authorizing Provider  amLODipine (NORVASC) 10 MG tablet Take 10 mg by mouth daily.     [provider]  baclofen (LIORESAL) 10 MG tablet Take 1 tablet (10 mg total) by mouth 3 (three) times daily as needed (Back pain). 03/04/19   Coral Spikes, DO  hydrochlorothiazide (HYDRODIURIL) 25 MG tablet Take 1 tablet (25 mg total) by mouth daily. 08/15/18   Coral Spikes, DO  gabapentin (NEURONTIN) 300 MG capsule gabapentin 300 mg capsule  one tab bid-tid  03/01/19  [provider]  ipratropium (ATROVENT) 0.06 % nasal spray Place 2 sprays into both nostrils 4 (four) times daily as needed for rhinitis. 10/22/19 03/16/20  Karen Kitchens, NP  levocetirizine (XYZAL) 5 MG tablet Take 1 tablet (5 mg total) by mouth every evening. 09/30/19 03/16/20  Coral Spikes, DO    Allergies Tylenol with codeine #3 [acetaminophen-codeine], Motrin [ibuprofen], and Tramadol  Family History  Problem Relation Age of Onset  . Hypertension Mother   . Heart disease Mother   . Colon cancer Father     Social History Social History   Tobacco Use  . Smoking status: Former Smoker    Types: Cigarettes    Quit date: 05/2018    Years since quitting: 1.8  . Smokeless tobacco: Former Network engineer  . Vaping Use: Never used  Substance Use Topics  . Alcohol use: Not Currently  . Drug use: Not Currently    Review of Systems  Constitutional: No fever/chills Eyes: No visual changes. ENT: No sore throat. Cardiovascular: Denies chest pain. Respiratory: Denies shortness of breath. Gastrointestinal:  abdominal pain.  Some  nausea, no vomiting.  No diarrhea.  No constipation. Genitourinary: Negative for dysuria. Musculoskeletal: Negative for back pain. Skin: Negative for rash. Neurological: Negative for headaches, focal weakness or   ____________________________________________   PHYSICAL EXAM:  VITAL SIGNS: ED Triage Vitals [04/06/20 1913]  Enc Vitals Group     BP (!) 140/96     Pulse Rate (!) 121     Resp 18     Temp 98.9 F (37.2 C)     Temp Source Oral     SpO2 96 %     Weight 200 lb (90.7 kg)     Height 6\' 5"  (1.956 m)     Head Circumference      Peak Flow      Pain Score 10     Pain Loc      Pain Edu?      Excl. in [place]?      Constitutional: Alert and oriented. Well appearing and in no acute distress. Eyes: Conjunctivae are normal. PER Head: Atraumatic. Nose: No congestion/rhinnorhea. Mouth/Throat: Mucous membranes are moist.  Oropharynx non-erythematous. Neck: No stridor.  Cardiovascular: Normal rate, regular rhythm. Grossly normal heart sounds.  Good peripheral circulation. Respiratory: Normal respiratory effort.  No retractions. Lungs CTAB. Gastrointestinal: Soft tender to palpation from the umbilicus down to the pubis and to both sides right and left lower quadrant. No distention. No abdominal bruits. No  Musculoskeletal: No lower extremity tenderness nor edema.   Neurologic:  Normal speech and language. No gross focal neurologic deficits are appreciated Skin:  Skin is warm, dry and intact. No rash noted.   ____________________________________________   LABS (all labs ordered are listed, but only abnormal results are displayed)  Labs Reviewed  COMPREHENSIVE METABOLIC PANEL - Abnormal; Notable for the following components:      Result Value   Glucose, Bld 106 (*)    Creatinine, Ser 1.57 (*)    GFR, Estimated 49 (*)    All other components within normal limits  CBC - Abnormal; Notable for the following components:   WBC 13.5 (*)    All other components within normal limits  URINALYSIS, COMPLETE (UACMP) WITH MICROSCOPIC - Abnormal; Notable for the following components:   Color, Urine YELLOW (*)    APPearance HAZY (*)    Specific Gravity, Urine 1.036 (*)    Protein, ur 30 (*)    All other components within normal limits  LIPASE, BLOOD  BASIC METABOLIC PANEL   ____________________________________________  EKG   ____________________________________________  RADIOLOGY Gertha Calkin, personally viewed and evaluated these images (plain radiographs) as part of my medical decision making, as well as reviewing the written report by the radiologist.  ED MD interpretation:    Official  radiology report(s): CT ABDOMEN PELVIS W CONTRAST  Result Date: 04/06/2020 CLINICAL DATA:  Lower abdominal pain EXAM: CT ABDOMEN AND PELVIS WITH CONTRAST TECHNIQUE: Multidetector CT imaging of the abdomen and pelvis was performed using the standard protocol following bolus administration of intravenous contrast. CONTRAST:  156mL OMNIPAQUE IOHEXOL 300 MG/ML  SOLN COMPARISON:  10/25/2019, MRI 11/25/2019 FINDINGS: Lower chest: The visualized lung bases are clear. Visualized heart and pericardium are unremarkable. Hepatobiliary: Mild pneumobilia is present compatible with probable prior sphincterotomy. Marked extrahepatic biliary ductal dilation is again identified, stable since prior examination. Cholecystectomy has been performed. Liver is otherwise unremarkable. Pancreas: Unremarkable Spleen: Unremarkable Adrenals/Urinary Tract: The adrenal glands are unremarkable. The kidneys are normal in size and position. 5 mm proteinaceous cyst noted within the a lower  pole of the right kidney, better characterized on prior MRI examination of 11/25/2019. Multiple simple cortical cyst noted within the lower pole of the left kidney. The kidneys are otherwise unremarkable. Bladder unremarkable peer Stomach/Bowel: Stomach is within normal limits. Appendix appears normal. No evidence of bowel wall thickening, distention, or inflammatory changes. No free intraperitoneal gas or fluid. Vascular/Lymphatic: Mild aortoiliac atherosclerotic calcification. No aortic aneurysm. No pathologic adenopathy within the abdomen and pelvis. Reproductive: Prostate is unremarkable. Other: Rectum unremarkable Musculoskeletal: L3-S1 posterior lumbar fusion with instrumentation with resection of the spinous processes of L4 and L5 has been performed. Degenerative changes are noted throughout the remainder of the lumbar spine. No acute bone abnormality. IMPRESSION: No radiographic explanation for the patient's reported abdominal pain. Stable marked  extrahepatic biliary ductal dilation, possibly representing post cholecystectomy change. Pneumobilia noted, new since prior examination, likely result of interval sphincterotomy. Aortic Atherosclerosis (ICD10-I70.0). Electronically Signed   By: Fidela Salisbury MD   On: 04/06/2020 22:57    ____________________________________________   PROCEDURES  Procedure(s) performed (including Critical Care):  Procedures   ____________________________________________   INITIAL IMPRESSION / ASSESSMENT AND PLAN / ED COURSE  Discussed the patient with Dr. Allen Norris, gastroenterology.  He is the doctor that did his ERCP for the retained stone earlier.  He will follow him up in the office.  I have given this gentleman 1 L of IV fluid we will repeat his BMP and make sure his creatinine is coming down and his GFR is going up some.  Plan on discharging him for follow-up with Dr. Verl Blalock.  I am uncertain what the etiology of this gentlemen's abdominal pain is.  His lab work is within normal limits except for the white count which is somewhat elevated at 13,000.  The CT is not showing any cause for his pain.  Patient reports his pain has been coming and going since the initial visit.  There does not seem to be any evidence of pancreatitis or gallbladder disease currently there is not any sign of appendicitis or obstruction or diverticulitis or anything else.  He does have a somewhat elevated white blood count however.  Most of his white blood counts in the past have been elevated there is only 1 that was somewhat normal at 10.5.              ____________________________________________   FINAL CLINICAL IMPRESSION(S) / ED DIAGNOSES  Final diagnoses:  Generalized abdominal pain     ED Discharge Orders    None      *Please note:  Earl Murray was evaluated in Emergency Department on 04/06/2020 for the symptoms described in the history of present illness. He was evaluated in the context of the global COVID-19  pandemic, which necessitated consideration that the patient might be at risk for infection with the SARS-CoV-2 virus that causes COVID-19. Institutional protocols and algorithms that pertain to the evaluation of patients at risk for COVID-19 are in a state of rapid change based on information released by regulatory bodies including the CDC and federal and state organizations. These policies and algorithms were followed during the patient's care in the ED.  Some ED evaluations and interventions may be delayed as a result of limited staffing during and the pandemic.*   Note:  This document was prepared using Dragon voice recognition software and may include unintentional dictation errors.    Nena Polio, MD 04/06/20 [id number]

## 2020-04-06 NOTE — ED Triage Notes (Signed)
Patient with complaint of lower abdominal pain that started on Monday. Patient denies nausea/vomiting or urinary symptoms. Patient states that his last BM was today.

## 2020-04-07 DIAGNOSIS — R1084 Generalized abdominal pain: Secondary | ICD-10-CM | POA: Diagnosis not present

## 2020-04-07 LAB — BASIC METABOLIC PANEL
Anion gap: 12 (ref 5–15)
BUN: 15 mg/dL (ref 8–23)
CO2: 27 mmol/L (ref 22–32)
Calcium: 9 mg/dL (ref 8.9–10.3)
Chloride: 98 mmol/L (ref 98–111)
Creatinine, Ser: 1.47 mg/dL — ABNORMAL HIGH (ref 0.61–1.24)
GFR, Estimated: 53 mL/min — ABNORMAL LOW (ref >60)
Glucose, Bld: 89 mg/dL (ref 70–99)
Potassium: 3.3 mmol/L — ABNORMAL LOW (ref 3.5–5.1)
Sodium: 137 mmol/L (ref 135–145)

## 2020-04-19 ENCOUNTER — Other Ambulatory Visit: Payer: Self-pay

## 2020-04-19 DIAGNOSIS — R197 Diarrhea, unspecified: Secondary | ICD-10-CM | POA: Insufficient documentation

## 2020-04-19 DIAGNOSIS — Z5321 Procedure and treatment not carried out due to patient leaving prior to being seen by health care provider: Secondary | ICD-10-CM | POA: Diagnosis not present

## 2020-04-19 DIAGNOSIS — R1031 Right lower quadrant pain: Secondary | ICD-10-CM | POA: Insufficient documentation

## 2020-04-19 DIAGNOSIS — R110 Nausea: Secondary | ICD-10-CM | POA: Insufficient documentation

## 2020-04-19 LAB — CBC
HCT: 41.3 % (ref 39.0–52.0)
Hemoglobin: 13.7 g/dL (ref 13.0–17.0)
MCH: 31.6 pg (ref 26.0–34.0)
MCHC: 33.2 g/dL (ref 30.0–36.0)
MCV: 95.2 fL (ref 80.0–100.0)
Platelets: 294 10*3/uL (ref 150–400)
RBC: 4.34 MIL/uL (ref 4.22–5.81)
RDW: 15.9 % — ABNORMAL HIGH (ref 11.5–15.5)
WBC: 11.3 10*3/uL — ABNORMAL HIGH (ref 4.0–10.5)
nRBC: 0 % (ref 0.0–0.2)

## 2020-04-19 LAB — COMPREHENSIVE METABOLIC PANEL
ALT: 14 U/L (ref 0–44)
AST: 17 U/L (ref 15–41)
Albumin: 4.2 g/dL (ref 3.5–5.0)
Alkaline Phosphatase: 66 U/L (ref 38–126)
Anion gap: 8 (ref 5–15)
BUN: 16 mg/dL (ref 8–23)
CO2: 28 mmol/L (ref 22–32)
Calcium: 9.7 mg/dL (ref 8.9–10.3)
Chloride: 106 mmol/L (ref 98–111)
Creatinine, Ser: 1.33 mg/dL — ABNORMAL HIGH (ref 0.61–1.24)
GFR, Estimated: 59 mL/min — ABNORMAL LOW (ref >60)
Glucose, Bld: 84 mg/dL (ref 70–99)
Potassium: 4.2 mmol/L (ref 3.5–5.1)
Sodium: 142 mmol/L (ref 135–145)
Total Bilirubin: 0.9 mg/dL (ref 0.3–1.2)
Total Protein: 7.4 g/dL (ref 6.5–8.1)

## 2020-04-19 LAB — LIPASE, BLOOD: Lipase: 27 U/L (ref 11–51)

## 2020-04-19 NOTE — ED Triage Notes (Signed)
Pt comes into the ED via EMS from home with RLQ pain for the past week with nausea and diarrhea, denies vomiting.Earl Murray

## 2020-04-19 NOTE — ED Triage Notes (Signed)
Pt comes via EMS from home with c/o abdominal pain. Pt states mid lower pain. Pt states some diarrhea. Pt states this has been going on and has not gotten better.

## 2020-04-20 ENCOUNTER — Emergency Department
Admission: EM | Admit: 2020-04-20 | Discharge: 2020-04-20 | Disposition: A | Discharge status: Home / Self Care | Payer: Medicare Other | Attending: Emergency Medicine | Admitting: Emergency Medicine

## 2020-04-20 NOTE — ED Notes (Signed)
Pt called third time without answer.

## 2020-04-20 NOTE — ED Notes (Signed)
Pt called again without answer.  

## 2020-04-20 NOTE — ED Notes (Signed)
Called for room without answer.

## 2020-08-09 ENCOUNTER — Other Ambulatory Visit: Payer: Self-pay

## 2020-08-09 ENCOUNTER — Emergency Department
Admission: EM | Admit: 2020-08-09 | Discharge: 2020-08-10 | Disposition: A | Discharge status: Home / Self Care | Payer: Medicare Other | Attending: Emergency Medicine | Admitting: Emergency Medicine

## 2020-08-09 ENCOUNTER — Emergency Department: Payer: Medicare Other

## 2020-08-09 DIAGNOSIS — M5146 Schmorl's nodes, lumbar region: Secondary | ICD-10-CM | POA: Diagnosis not present

## 2020-08-09 DIAGNOSIS — R109 Unspecified abdominal pain: Secondary | ICD-10-CM | POA: Diagnosis not present

## 2020-08-09 DIAGNOSIS — I10 Essential (primary) hypertension: Secondary | ICD-10-CM | POA: Insufficient documentation

## 2020-08-09 DIAGNOSIS — Z87891 Personal history of nicotine dependence: Secondary | ICD-10-CM | POA: Diagnosis not present

## 2020-08-09 DIAGNOSIS — Y93H2 Activity, gardening and landscaping: Secondary | ICD-10-CM | POA: Diagnosis not present

## 2020-08-09 DIAGNOSIS — W172XXA Fall into hole, initial encounter: Secondary | ICD-10-CM | POA: Insufficient documentation

## 2020-08-09 DIAGNOSIS — M519 Unspecified thoracic, thoracolumbar and lumbosacral intervertebral disc disorder: Secondary | ICD-10-CM

## 2020-08-09 DIAGNOSIS — Y92007 Garden or yard of unspecified non-institutional (private) residence as the place of occurrence of the external cause: Secondary | ICD-10-CM | POA: Insufficient documentation

## 2020-08-09 DIAGNOSIS — S3992XA Unspecified injury of lower back, initial encounter: Secondary | ICD-10-CM | POA: Diagnosis present

## 2020-08-09 DIAGNOSIS — Z79899 Other long term (current) drug therapy: Secondary | ICD-10-CM | POA: Diagnosis not present

## 2020-08-09 DIAGNOSIS — M5416 Radiculopathy, lumbar region: Secondary | ICD-10-CM | POA: Diagnosis not present

## 2020-08-09 DIAGNOSIS — S39012A Strain of muscle, fascia and tendon of lower back, initial encounter: Secondary | ICD-10-CM | POA: Diagnosis not present

## 2020-08-09 LAB — URINALYSIS, COMPLETE (UACMP) WITH MICROSCOPIC
Bacteria, UA: NONE SEEN
Bilirubin Urine: NEGATIVE
Glucose, UA: NEGATIVE mg/dL
Ketones, ur: NEGATIVE mg/dL
Leukocytes,Ua: NEGATIVE
Nitrite: NEGATIVE
Protein, ur: NEGATIVE mg/dL
Specific Gravity, Urine: 1.031 — ABNORMAL HIGH (ref 1.005–1.030)
pH: 5 (ref 5.0–8.0)

## 2020-08-09 LAB — COMPREHENSIVE METABOLIC PANEL
ALT: 16 U/L (ref 0–44)
AST: 17 U/L (ref 15–41)
Albumin: 4.1 g/dL (ref 3.5–5.0)
Alkaline Phosphatase: 83 U/L (ref 38–126)
Anion gap: 8 (ref 5–15)
BUN: 16 mg/dL (ref 8–23)
CO2: 23 mmol/L (ref 22–32)
Calcium: 9.4 mg/dL (ref 8.9–10.3)
Chloride: 107 mmol/L (ref 98–111)
Creatinine, Ser: 1.13 mg/dL (ref 0.61–1.24)
GFR, Estimated: >60 mL/min (ref >60)
Glucose, Bld: 96 mg/dL (ref 70–99)
Potassium: 3.6 mmol/L (ref 3.5–5.1)
Sodium: 138 mmol/L (ref 135–145)
Total Bilirubin: 0.7 mg/dL (ref 0.3–1.2)
Total Protein: 7.4 g/dL (ref 6.5–8.1)

## 2020-08-09 LAB — CBC
HCT: 42.3 % (ref 39.0–52.0)
Hemoglobin: 14.3 g/dL (ref 13.0–17.0)
MCH: 30.9 pg (ref 26.0–34.0)
MCHC: 33.8 g/dL (ref 30.0–36.0)
MCV: 91.4 fL (ref 80.0–100.0)
Platelets: 316 10*3/uL (ref 150–400)
RBC: 4.63 MIL/uL (ref 4.22–5.81)
RDW: 15.2 % (ref 11.5–15.5)
WBC: 14.5 10*3/uL — ABNORMAL HIGH (ref 4.0–10.5)
nRBC: 0 % (ref 0.0–0.2)

## 2020-08-09 LAB — LIPASE, BLOOD: Lipase: 36 U/L (ref 11–51)

## 2020-08-09 IMAGING — MR MR LUMBAR SPINE W/O CM
5 series · 30 of 48 positions shown · non-contrast
Comparison: Previous radiograph from [DATE].

CLINICAL DATA: Initial evaluation for acute low back pain. History
of prior surgery.

EXAM:
MRI LUMBAR SPINE WITHOUT CONTRAST
TECHNIQUE: Multiplanar, multisequence MR imaging of the lumbar spine was
performed. No intravenous contrast was administered.

[Series 5: T2 · sagittal · 4.0mm · 0.81mm/px · 6 of 17 slices shown (1 of 2)]
[im 1/17]
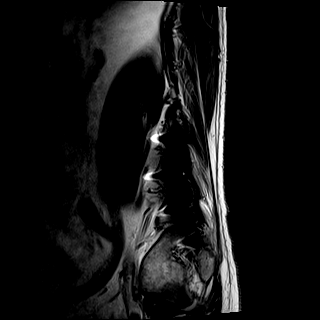
[im 4/17]
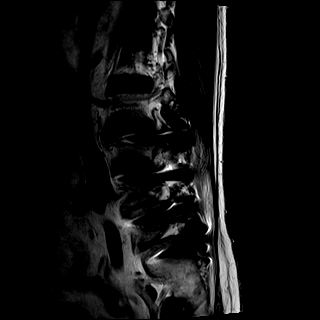
[im 7/17]
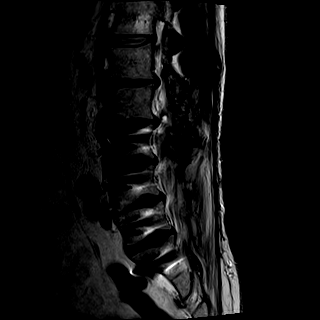
[im 10/17]
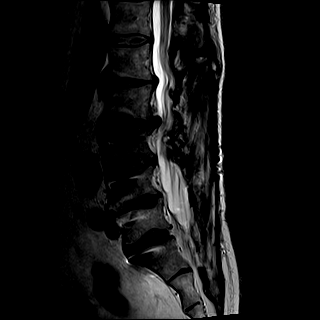
[im 13/17]
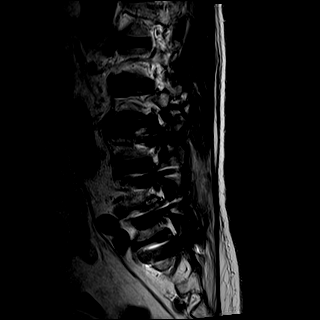
[im 17/17]
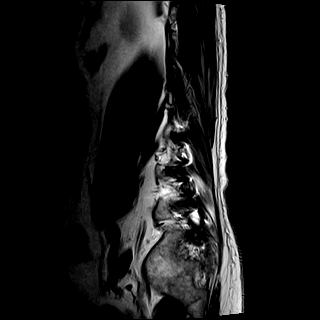

[Series 6: T1 · sagittal · 4.0mm · 0.81mm/px · 7 of 17 slices shown (1 of 2)]
[im 1/17]
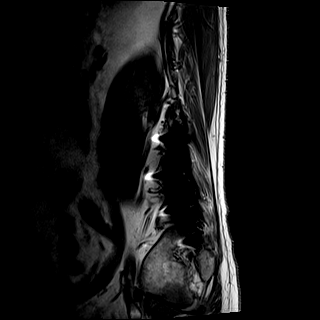
[im 3/17]
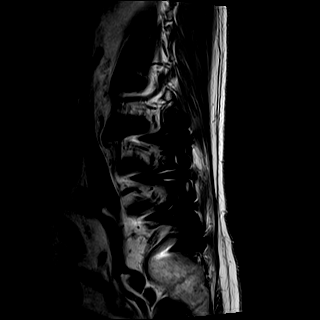
[im 6/17]
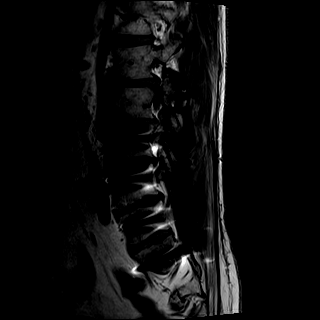
[im 9/17]
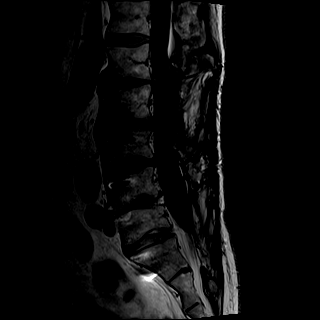
[im 11/17]
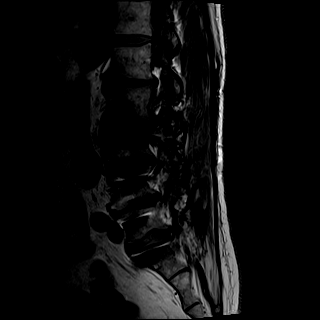
[im 14/17]
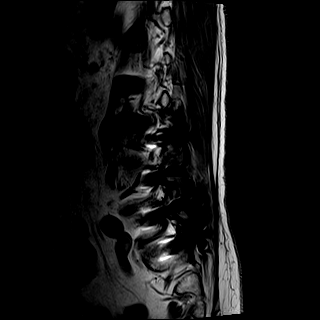
[im 17/17]
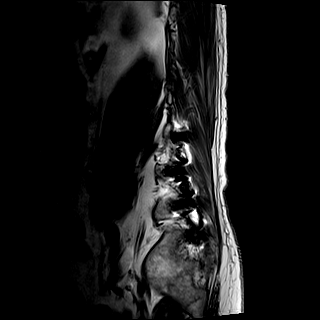

[Series 7: STIR · sagittal · 4.0mm · 0.41mm/px · 1 of 17 slices shown]
[im 1/17]
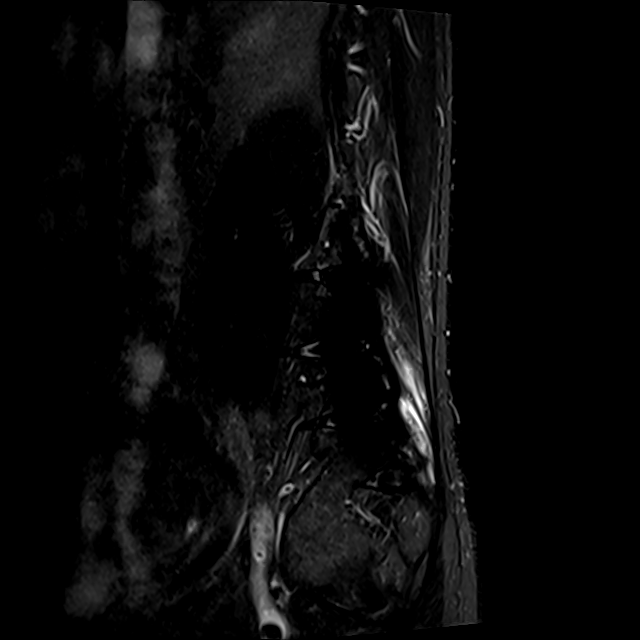

[Series 8: T2 · axial · 4.0mm · 0.78mm/px · z∈[-142,+66]mm · 8 of 36 slices shown (2 of 2)]
[im 1/36]
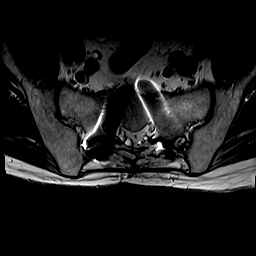
[im 6/36]
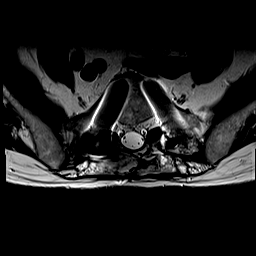
[im 11/36]
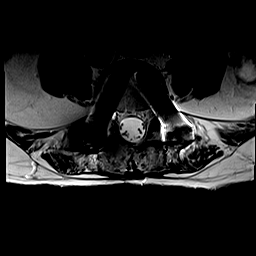
[im 17/36]
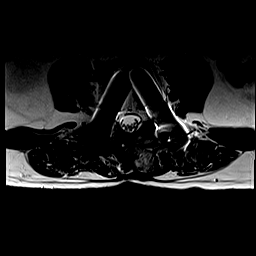
[im 19/36]
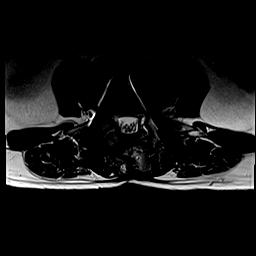
[im 25/36]
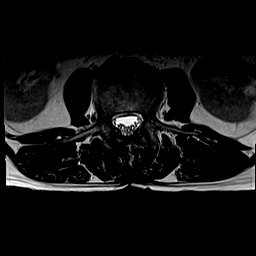
[im 30/36]
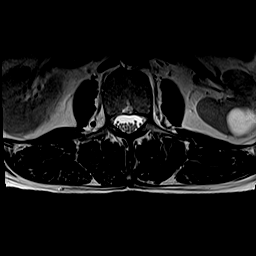
[im 36/36]
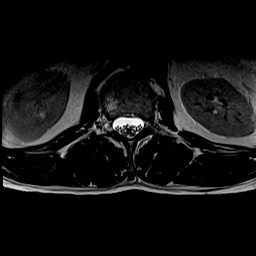

[Series 9: T1 · axial · 4.0mm · 0.39mm/px · z∈[-142,+66]mm · 8 of 36 slices shown (2 of 2)]
[im 1/36]
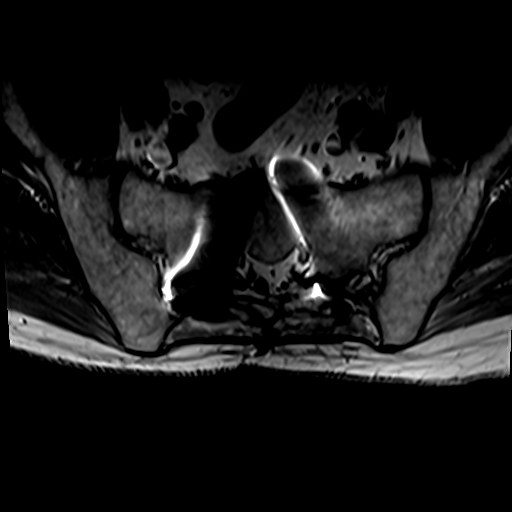
[im 6/36]
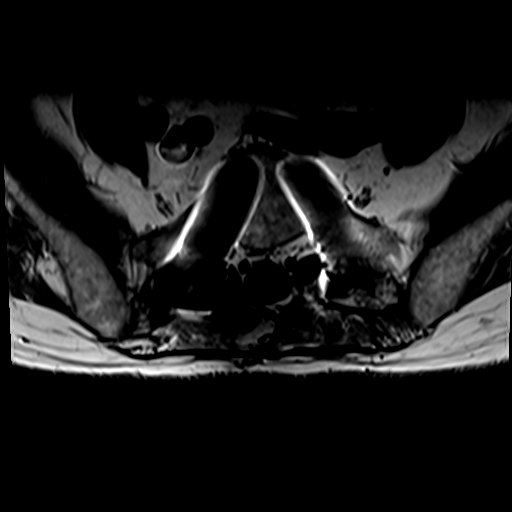
[im 11/36]
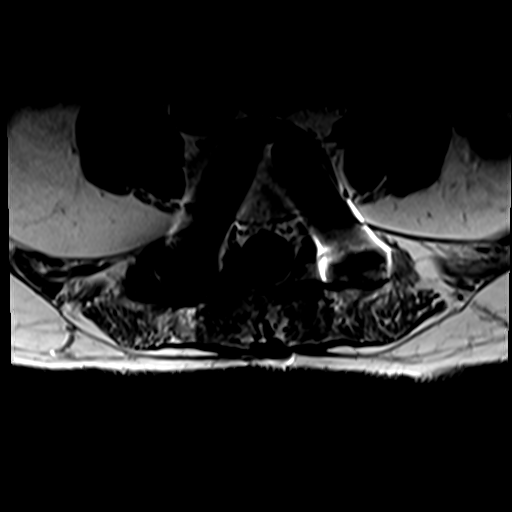
[im 17/36]
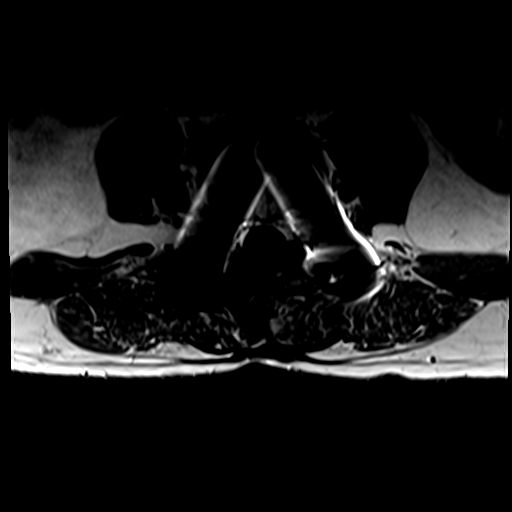
[im 19/36]
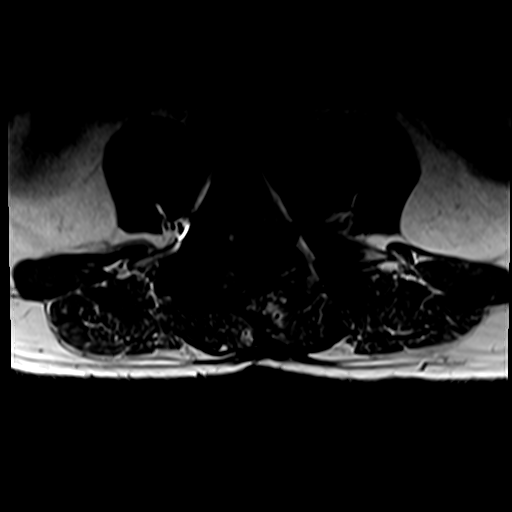
[im 25/36]
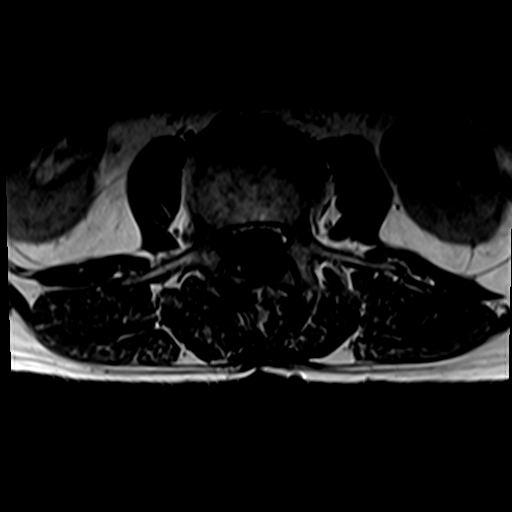
[im 30/36]
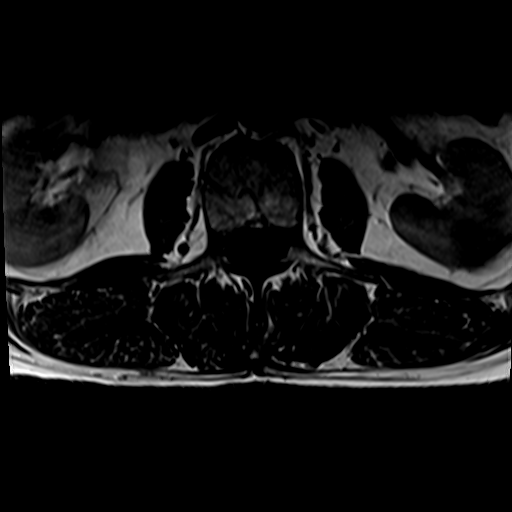
[im 36/36]
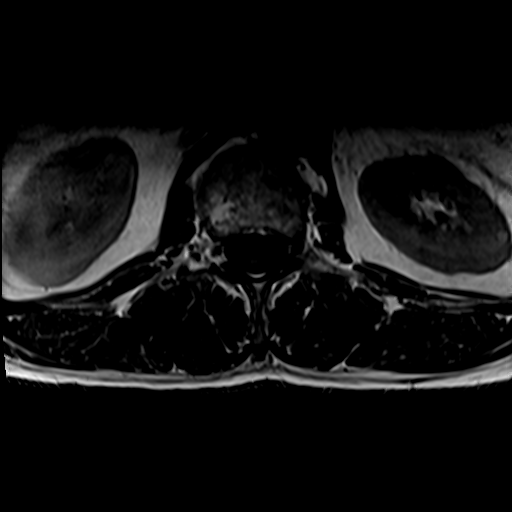

[30 of 48 positions shown; findings below may reference images not displayed]

FINDINGS: Segmentation: Standard. Lowest well-formed disc space labeled the
L5-S1 level.

Alignment: Straightening of the normal lumbar lordosis. Trace facet
mediated anterolisthesis of L2 on L3.

Vertebrae: Susceptibility artifact from prior posterior fusion at L3
through S1. Vertebral body height maintained without acute or
chronic fracture. Bone marrow signal intensity within normal limits.
Few scattered benign hemangiomata noted. No worrisome osseous
lesions. Endplate Schmorl's node with associated reactive marrow
edema noted about the L2-3 interspace. No other abnormal marrow
edema.

Conus medullaris and cauda equina: Conus extends to the T12 level.
Conus and cauda equina appear normal.

Paraspinal and other soft tissues: Chronic postoperative changes
present within the posterior paraspinous soft tissues. Paraspinous
soft tissues demonstrate no acute finding. Few benign appearing left
renal cysts noted, largest of which measures 2.7 cm. Visualized
visceral structures otherwise unremarkable.

Disc levels:

T12-L1: Unremarkable.

L1-2: Degenerative intervertebral disc space narrowing with diffuse
disc bulge and disc desiccation. Mild reactive endplate spurring.
Mild facet hypertrophy. Previous posterior decompression. No
residual spinal stenosis. Foramina remain patent.

L2-3: Trace anterolisthesis. Degenerative intervertebral disc space
narrowing with diffuse disc bulge and disc desiccation. Superimposed
broad-based left foraminal to extraforaminal disc protrusion (series
8, image 16). Protruding disc contacts the exiting left L2 nerve
root (series 6, image 13). Moderate right worse than left facet
hypertrophy. Prior posterior decompression. No significant spinal
stenosis. Mild right with moderate left L2 foraminal narrowing.

L3-4: Disc desiccation with mild disc bulge. Prior posterior fusion
with decompression. Bilateral facet hypertrophy. No residual spinal
stenosis. Foramina appear patent.

L4-5: Disc desiccation with mild disc bulge. Prior posterior fusion
and decompression. Residual facet hypertrophy. No spinal stenosis.
Foramina appear patent.

L5-S1: Disc desiccation without significant disc bulge. Prior
posterior decompression with fusion. No residual spinal stenosis.
Epidural lipomatosis. Foramina remain patent.
IMPRESSION: 1. No acute abnormality within the lumbar spine.
2. Prior posterior decompression with fusion at L1 through S1
without residual spinal stenosis.
3. Broad-based left foraminal to extraforaminal disc protrusion at
L2-3, contacting and potentially affecting the exiting left L2 nerve
root.
4. Endplate Schmorl's node deformity with associated reactive marrow
edema about the L2-3 interspace, which could contribute to
underlying back pain.

## 2020-08-09 MED ORDER — CYCLOBENZAPRINE HCL 10 MG PO TABS: 10.0000 mg | ORAL_TABLET | Freq: Three times a day (TID) | ORAL | 0 refills | Status: AC

## 2020-08-09 MED ORDER — HYDROMORPHONE HCL 1 MG/ML IJ SOLN
1.0000 mg | Freq: Once | INTRAMUSCULAR | Status: AC
  Administered 2020-08-09: 1 mg via INTRAVENOUS
  Filled 2020-08-09: qty 1

## 2020-08-09 MED ORDER — DEXAMETHASONE SODIUM PHOSPHATE 10 MG/ML IJ SOLN
10.0000 mg | Freq: Once | INTRAMUSCULAR | Status: AC
  Administered 2020-08-09: 10 mg via INTRAVENOUS
  Filled 2020-08-09: qty 1

## 2020-08-09 MED ORDER — OXYCODONE-ACETAMINOPHEN 5-325 MG PO TABS
2.0000 | ORAL_TABLET | Freq: Once | ORAL | Status: AC
  Administered 2020-08-10: 2 via ORAL
  Filled 2020-08-09: qty 2

## 2020-08-09 MED ORDER — PREDNISONE 10 MG (21) PO TBPK: ORAL_TABLET | ORAL | 0 refills | Status: DC

## 2020-08-09 MED ORDER — OXYCODONE HCL 5 MG PO TABS
5.0000 mg | ORAL_TABLET | Freq: Three times a day (TID) | ORAL | 0 refills | Status: DC | PRN
Start: 2020-08-09 — End: 2020-08-31

## 2020-08-09 NOTE — ED Notes (Signed)
Pt given pillow, warm blanket, and tv remote for comfort at this time. Pt reminded to make any needs known using call bell within reach.

## 2020-08-09 NOTE — Discharge Instructions (Addendum)
Take the oxycodone in addition to your usual meds for breakthrough pain  Take the prednisone pack for inflammation  Continue naproxen/alleve twice a day  Take the muscle relaxers as needed

## 2020-08-09 NOTE — ED Provider Notes (Signed)
Earl Murray  ____________________________________________   Event Date/Time   First MD Initiated Contact with Patient 08/09/20 2059     (approximate)  I have reviewed the triage vital signs and the nursing notes.   HISTORY  Chief Complaint Abdominal Pain and Back Pain    HPI Earl Murray is a 65 y.o. male with history of hypertension, prior lumbar surgeries, here with back pain.  The patient was mowing his lawn today when he stepped into a pothole.  He states he immediately experienced severe, aching, throbbing, 10 of 10, back pain.  He feels like his back pain began to "spasm "and that he could not walk.  He states he has been having some intermittent abdominal pain prior to this but states that this is a somewhat chronic issue for which she has been seen by GI and his PCP.  This not acutely worse.  He was well prior to the injury today.  He states he has some pain radiating down his bilateral lower legs, which does seem new.  He states he has had no overt weakness although he has had difficulty walking due to pain.  No loss of bowel or bladder function.  No fevers.  Other complaints.        Past Medical History:  Diagnosis Date  . Hypertension   . Neck injury     Patient Active Problem List   Diagnosis Date Noted  . Abnormal magnetic resonance imaging of liver   . Choledocholithiasis 10/25/2019  . Hypertension   . Hallux valgus, acquired 04/20/2016  . 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 Stress reaction 04/20/2016    Past Surgical History:  Procedure Laterality Date  . BACK SURGERY    . CHOLECYSTECTOMY    . ELBOW SURGERY    . ERCP N/A 10/27/2019   Procedure: ENDOSCOPIC RETROGRADE CHOLANGIOPANCREATOGRAPHY (ERCP);  Surgeon: Lucilla Lame, MD;  Location: [facility] ENDOSCOPY;  Service: Endoscopy;  Laterality: N/A;  . FOOT SURGERY    . HERNIA REPAIR     4 hernia repairs    Prior to Admission medications    Medication Sig Start Date End Date Taking? Authorizing Provider  cyclobenzaprine (FLEXERIL) 10 MG tablet Take 1 tablet (10 mg total) by mouth 3 (three) times daily for 7 days. 08/09/20 08/16/20 Yes Duffy Bruce, MD  oxyCODONE (ROXICODONE) 5 MG immediate release tablet Take 1-2 tablets (5-10 mg total) by mouth every 8 (eight) hours as needed for moderate pain or severe pain. 08/09/20 08/09/21 Yes Duffy Bruce, MD  predniSONE (STERAPRED UNI-PAK 21 TAB) 10 MG (21) TBPK tablet Take as outlined on box 08/09/20  Yes Duffy Bruce, MD  amLODipine (NORVASC) 10 MG tablet Take 10 mg by mouth daily.     [provider]  baclofen (LIORESAL) 10 MG tablet Take 1 tablet (10 mg total) by mouth 3 (three) times daily as needed (Back pain). 03/04/19   Coral Spikes, DO  hydrochlorothiazide (HYDRODIURIL) 25 MG tablet Take 1 tablet (25 mg total) by mouth daily. 08/15/18   Coral Spikes, DO  gabapentin (NEURONTIN) 300 MG capsule gabapentin 300 mg capsule  one tab bid-tid  03/01/19  [provider]  ipratropium (ATROVENT) 0.06 % nasal spray Place 2 sprays into both nostrils 4 (four) times daily as needed for rhinitis. 10/22/19 03/16/20  Karen Kitchens, NP  levocetirizine (XYZAL) 5 MG tablet Take 1 tablet (5 mg total) by mouth every evening. 09/30/19 03/16/20  Coral Spikes, DO  Allergies Tylenol with codeine #3 [acetaminophen-codeine], Motrin [ibuprofen], and Tramadol  Family History  Problem Relation Age of Onset  . Hypertension Mother   . Heart disease Mother   . Colon cancer Father     Social History Social History   Tobacco Use  . Smoking status: Former Smoker    Types: Cigarettes    Quit date: 05/2018    Years since quitting: 2.2  . Smokeless tobacco: Former Network engineer  . Vaping Use: Never used  Substance Use Topics  . Alcohol use: Not Currently  . Drug use: Not Currently    Review of Systems  Review of Systems  Constitutional: Negative for chills and fever.  HENT:  Negative for sore throat.   Respiratory: Negative for shortness of breath.   Cardiovascular: Negative for chest pain.  Gastrointestinal: Negative for abdominal pain.  Genitourinary: Negative for flank pain.  Musculoskeletal: Positive for back pain. Negative for neck pain.  Skin: Negative for rash and wound.  Allergic/Immunologic: Negative for immunocompromised state.  Neurological: Negative for weakness and numbness.  Hematological: Does not bruise/bleed easily.     ____________________________________________  PHYSICAL EXAM:      VITAL SIGNS: ED Triage Vitals  Enc Vitals Group     BP 08/09/20 1813 (!) 145/98     Pulse Rate 08/09/20 1813 95     Resp 08/09/20 1813 18     Temp 08/09/20 1813 99 F (37.2 C)     Temp Source 08/09/20 1813 Oral     SpO2 08/09/20 1813 96 %     Weight 08/09/20 1812 200 lb (90.7 kg)     Height 08/09/20 1812 6\' 5"  (1.956 m)     Head Circumference --      Peak Flow --      Pain Score 08/09/20 1812 10     Pain Loc --      Pain Edu? --      Excl. in [place]? --      Physical Exam Vitals and nursing Murray reviewed.  Constitutional:      General: He is not in acute distress.    Appearance: He is well-developed.  HENT:     Head: Normocephalic and atraumatic.  Eyes:     Conjunctiva/sclera: Conjunctivae normal.  Cardiovascular:     Rate and Rhythm: Normal rate and regular rhythm.     Heart sounds: Normal heart sounds. No murmur heard. No friction rub.  Pulmonary:     Effort: Pulmonary effort is normal. No respiratory distress.     Breath sounds: Normal breath sounds. No wheezing or rales.  Abdominal:     General: There is no distension.     Palpations: Abdomen is soft.     Tenderness: There is no abdominal tenderness.  Musculoskeletal:     Cervical back: Neck supple.  Skin:    General: Skin is warm.     Capillary Refill: Capillary refill takes less than 2 seconds.     Findings: No rash.  Neurological:     Mental Status: He is alert and oriented  to person, place, and time.     Motor: No abnormal muscle tone.      Spine Exam: Inspection/Palpation: Moderate lumbar TTP diffusely, worse in lower lumbar spine. No step off or deformity. Strength: 5/5 throughout LE bilaterally (hip flexion/extension, adduction/abduction; knee flexion/extension; foot dorsiflexion/plantarflexion, inversion/eversion; great toe inversion) Sensation: Intact to light touch in proximal and distal LE bilaterally Reflexes: 2+ quadriceps and achilles reflexes  ____________________________________________  LABS (all labs ordered are listed, but only abnormal results are displayed)  Labs Reviewed  CBC - Abnormal; Notable for the following components:      Result Value   WBC 14.5 (*)    All other components within normal limits  URINALYSIS, COMPLETE (UACMP) WITH MICROSCOPIC - Abnormal; Notable for the following components:   Color, Urine YELLOW (*)    APPearance CLEAR (*)    Specific Gravity, Urine 1.031 (*)    Hgb urine dipstick SMALL (*)    All other components within normal limits  LIPASE, BLOOD  COMPREHENSIVE METABOLIC PANEL    ____________________________________________  EKG:  ________________________________________  RADIOLOGY All imaging, including plain films, CT scans, and ultrasounds, independently reviewed by me, and interpretations confirmed via formal radiology reads.  ED MD interpretation:   MR Spine: No acute abnormality or fracture, broad-based L2 disc protrusion, endplate Schmorl's node with reactive marrow which could correlate with pain  Official radiology report(s): MR LUMBAR SPINE WO CONTRAST  Result Date: 08/09/2020 CLINICAL DATA:  Initial evaluation for acute low back pain. History of prior surgery. EXAM: MRI LUMBAR SPINE WITHOUT CONTRAST TECHNIQUE: Multiplanar, multisequence MR imaging of the lumbar spine was performed. No intravenous contrast was administered. COMPARISON:  Previous radiograph from 03/03/2019. FINDINGS:  Segmentation: Standard. Lowest well-formed disc space labeled the L5-S1 level. Alignment: Straightening of the normal lumbar lordosis. Trace facet mediated anterolisthesis of L2 on L3. Vertebrae: Susceptibility artifact from prior posterior fusion at L3 through S1. Vertebral body height maintained without acute or chronic fracture. Bone marrow signal intensity within normal limits. Few scattered benign hemangiomata noted. No worrisome osseous lesions. Endplate Schmorl's node with associated reactive marrow edema noted about the L2-3 interspace. No other abnormal marrow edema. Conus medullaris and cauda equina: Conus extends to the T12 level. Conus and cauda equina appear normal. Paraspinal and other soft tissues: Chronic postoperative changes present within the posterior paraspinous soft tissues. Paraspinous soft tissues demonstrate no acute finding. Few benign appearing left renal cysts noted, largest of which measures 2.7 cm. Visualized visceral structures otherwise unremarkable. Disc levels: T12-L1: Unremarkable. L1-2: Degenerative intervertebral disc space narrowing with diffuse disc bulge and disc desiccation. Mild reactive endplate spurring. Mild facet hypertrophy. Previous posterior decompression. No residual spinal stenosis. Foramina remain patent. L2-3: Trace anterolisthesis. Degenerative intervertebral disc space narrowing with diffuse disc bulge and disc desiccation. Superimposed broad-based left foraminal to extraforaminal disc protrusion (series 8, image 16). Protruding disc contacts the exiting left L2 nerve root (series 6, image 13). Moderate right worse than left facet hypertrophy. Prior posterior decompression. No significant spinal stenosis. Mild right with moderate left L2 foraminal narrowing. L3-4: Disc desiccation with mild disc bulge. Prior posterior fusion with decompression. Bilateral facet hypertrophy. No residual spinal stenosis. Foramina appear patent. L4-5: Disc desiccation with mild disc  bulge. Prior posterior fusion and decompression. Residual facet hypertrophy. No spinal stenosis. Foramina appear patent. L5-S1: Disc desiccation without significant disc bulge. Prior posterior decompression with fusion. No residual spinal stenosis. Epidural lipomatosis. Foramina remain patent. IMPRESSION: 1. No acute abnormality within the lumbar spine. 2. Prior posterior decompression with fusion at L1 through S1 without residual spinal stenosis. 3. Broad-based left foraminal to extraforaminal disc protrusion at L2-3, contacting and potentially affecting the exiting left L2 nerve root. 4. Endplate Schmorl's node deformity with associated reactive marrow edema about the L2-3 interspace, which could contribute to underlying back pain. Electronically Signed   By: Jeannine Boga M.D.   On: 08/09/2020 23:39    ____________________________________________  PROCEDURES  Procedure(s) performed (including Critical Care):  Procedures  ____________________________________________  INITIAL IMPRESSION / MDM / ASSESSMENT AND PLAN / ED COURSE  As part of my medical decision making, I reviewed the following data within the [place] notes reviewed and incorporated, Old chart reviewed, Notes from prior ED visits, and [place] Controlled Substance Database       *Anselm Aumiller was evaluated in Emergency Department on 08/10/2020 for the symptoms described in the history of present illness. He was evaluated in the context of the global COVID-19 pandemic, which necessitated consideration that the patient might be at risk for infection with the SARS-CoV-2 virus that causes COVID-19. Institutional protocols and algorithms that pertain to the evaluation of patients at risk for COVID-19 are in a state of rapid change based on information released by regulatory bodies including the CDC and federal and state organizations. These policies and algorithms were followed during the patient's care in the  ED.  Some ED evaluations and interventions may be delayed as a result of limited staffing during the pandemic.*     Medical Decision Making: 65 year old male here with back pain after stepping into a pothole.  Distal strength and sensation is intact, but he does endorse new radicular symptoms.  Given this and his history, MRI obtained which fortunately shows no evidence of cord compression or acute fracture.  He does have some endplate edema at L2 and L3, as well as broad-based left-sided disc bulge, which correlates with his pain.  No apparent emergent surgical abnormality.  Will start on prednisone, give brief course of additional analgesia, and refer for spine specialist follow-up.  No lower extremity weakness, loss of bowel or bladder, or signs of cauda equina.  No other red flags.  ____________________________________________  FINAL CLINICAL IMPRESSION(S) / ED DIAGNOSES  Final diagnoses:  Lumbar radiculopathy  Schmorl's node  Strain of lumbar region, initial encounter     MEDICATIONS GIVEN DURING THIS VISIT:  Medications  HYDROmorphone (DILAUDID) injection 1 mg (1 mg Intravenous Given 08/09/20 2216)  dexamethasone (DECADRON) injection 10 mg (10 mg Intravenous Given 08/09/20 2216)  oxyCODONE-acetaminophen (PERCOCET/ROXICET) 5-325 MG per tablet 2 tablet (2 tablets Oral Given 08/10/20 0000)     ED Discharge Orders         Ordered    predniSONE (STERAPRED UNI-PAK 21 TAB) 10 MG (21) TBPK tablet        08/09/20 2355    cyclobenzaprine (FLEXERIL) 10 MG tablet  3 times daily        08/09/20 2355    oxyCODONE (ROXICODONE) 5 MG immediate release tablet  Every 8 hours PRN        08/09/20 2355           Murray:  This document was prepared using Dragon voice recognition software and may include unintentional dictation errors.   Duffy Bruce, MD 08/10/20 [phone]

## 2020-08-09 NOTE — ED Notes (Signed)
ED Provider Isaacs at bedside.

## 2020-08-09 NOTE — ED Triage Notes (Signed)
Pt states lower abd and low back pain. States was cutting grass and pain began. Hx back surgeries. Diarrhea x 5  Today. Denies blood stool. Denies NV& fevers. Denies urinary symptoms.

## 2020-08-10 DIAGNOSIS — S39012A Strain of muscle, fascia and tendon of lower back, initial encounter: Secondary | ICD-10-CM | POA: Diagnosis not present

## 2020-08-30 ENCOUNTER — Emergency Department
Admission: EM | Admit: 2020-08-30 | Discharge: 2020-08-30 | Disposition: A | Discharge status: Home / Self Care | Payer: Medicare Other | Attending: Emergency Medicine | Admitting: Emergency Medicine

## 2020-08-30 ENCOUNTER — Other Ambulatory Visit: Payer: Self-pay

## 2020-08-30 ENCOUNTER — Encounter: Payer: Self-pay | Admitting: Emergency Medicine

## 2020-08-30 ENCOUNTER — Emergency Department: Payer: Medicare Other

## 2020-08-30 DIAGNOSIS — M5440 Lumbago with sciatica, unspecified side: Secondary | ICD-10-CM | POA: Diagnosis not present

## 2020-08-30 DIAGNOSIS — Z79899 Other long term (current) drug therapy: Secondary | ICD-10-CM | POA: Diagnosis not present

## 2020-08-30 DIAGNOSIS — I10 Essential (primary) hypertension: Secondary | ICD-10-CM | POA: Insufficient documentation

## 2020-08-30 DIAGNOSIS — Z87891 Personal history of nicotine dependence: Secondary | ICD-10-CM | POA: Insufficient documentation

## 2020-08-30 DIAGNOSIS — M549 Dorsalgia, unspecified: Secondary | ICD-10-CM | POA: Diagnosis present

## 2020-08-30 LAB — URINALYSIS, COMPLETE (UACMP) WITH MICROSCOPIC
Bacteria, UA: NONE SEEN
Bilirubin Urine: NEGATIVE
Glucose, UA: NEGATIVE mg/dL
Hgb urine dipstick: NEGATIVE
Ketones, ur: NEGATIVE mg/dL
Leukocytes,Ua: NEGATIVE
Nitrite: NEGATIVE
Protein, ur: NEGATIVE mg/dL
Specific Gravity, Urine: 1.033 — ABNORMAL HIGH (ref 1.005–1.030)
pH: 5 (ref 5.0–8.0)

## 2020-08-30 LAB — CBC WITH DIFFERENTIAL/PLATELET
Abs Immature Granulocytes: 0.03 10*3/uL (ref 0.00–0.07)
Basophils Absolute: 0.1 10*3/uL (ref 0.0–0.1)
Basophils Relative: 1 %
Eosinophils Absolute: 0.3 10*3/uL (ref 0.0–0.5)
Eosinophils Relative: 2 %
HCT: 40.5 % (ref 39.0–52.0)
Hemoglobin: 13.7 g/dL (ref 13.0–17.0)
Immature Granulocytes: 0 %
Lymphocytes Relative: 42 %
Lymphs Abs: 4.9 10*3/uL — ABNORMAL HIGH (ref 0.7–4.0)
MCH: 30.7 pg (ref 26.0–34.0)
MCHC: 33.8 g/dL (ref 30.0–36.0)
MCV: 90.8 fL (ref 80.0–100.0)
Monocytes Absolute: 0.8 10*3/uL (ref 0.1–1.0)
Monocytes Relative: 7 %
Neutro Abs: 5.7 10*3/uL (ref 1.7–7.7)
Neutrophils Relative %: 48 %
Platelets: 251 10*3/uL (ref 150–400)
RBC: 4.46 MIL/uL (ref 4.22–5.81)
RDW: 15.9 % — ABNORMAL HIGH (ref 11.5–15.5)
WBC: 11.8 10*3/uL — ABNORMAL HIGH (ref 4.0–10.5)
nRBC: 0 % (ref 0.0–0.2)

## 2020-08-30 LAB — COMPREHENSIVE METABOLIC PANEL
ALT: 19 U/L (ref 0–44)
AST: 20 U/L (ref 15–41)
Albumin: 4 g/dL (ref 3.5–5.0)
Alkaline Phosphatase: 86 U/L (ref 38–126)
Anion gap: 8 (ref 5–15)
BUN: 15 mg/dL (ref 8–23)
CO2: 27 mmol/L (ref 22–32)
Calcium: 9.5 mg/dL (ref 8.9–10.3)
Chloride: 106 mmol/L (ref 98–111)
Creatinine, Ser: 1.1 mg/dL (ref 0.61–1.24)
GFR, Estimated: >60 mL/min (ref >60)
Glucose, Bld: 99 mg/dL (ref 70–99)
Potassium: 3.8 mmol/L (ref 3.5–5.1)
Sodium: 141 mmol/L (ref 135–145)
Total Bilirubin: 0.5 mg/dL (ref 0.3–1.2)
Total Protein: 7.4 g/dL (ref 6.5–8.1)

## 2020-08-30 IMAGING — MR MR LUMBAR SPINE W/O CM
5 series · 31 of 48 positions shown · non-contrast
Comparison: [DATE]

CLINICAL DATA: Low back pain.

EXAM:
MRI LUMBAR SPINE WITHOUT CONTRAST
TECHNIQUE: Multiplanar, multisequence MR imaging of the lumbar spine was
performed. No intravenous contrast was administered.

[Series 5: T2 · sagittal · 4.0mm · 0.88mm/px · 6 of 17 slices shown (1 of 2)]
[im 1/17]
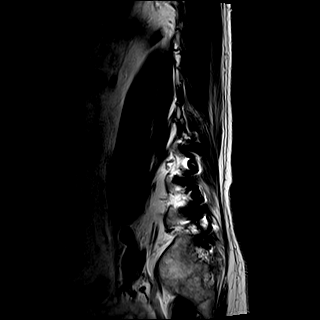
[im 4/17]
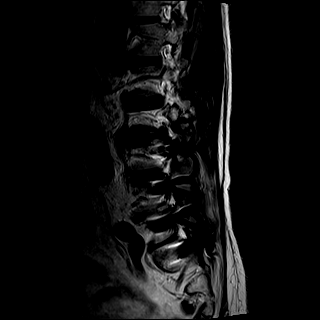
[im 7/17]
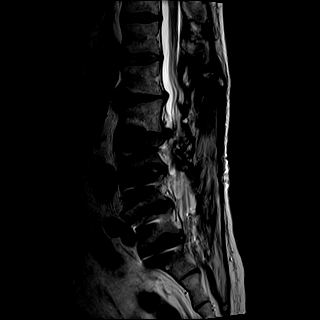
[im 10/17]
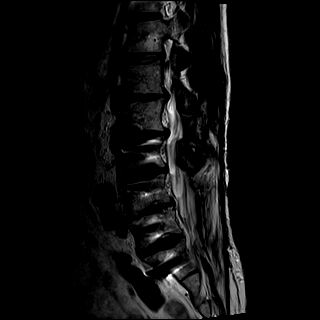
[im 13/17]
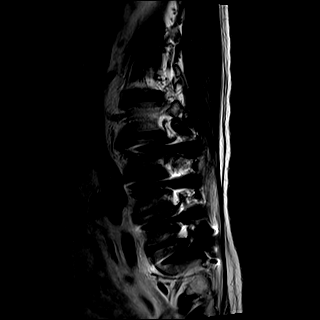
[im 17/17]
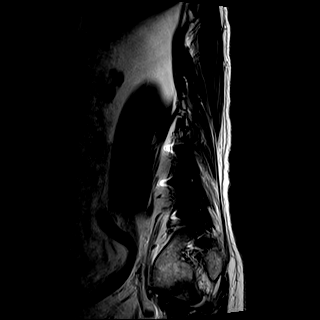

[Series 6: T1 · sagittal · 4.0mm · 0.88mm/px · 7 of 17 slices shown (1 of 2)]
[im 1/17]
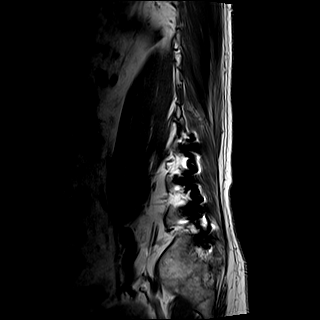
[im 3/17]
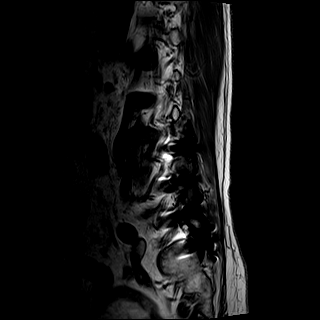
[im 6/17]
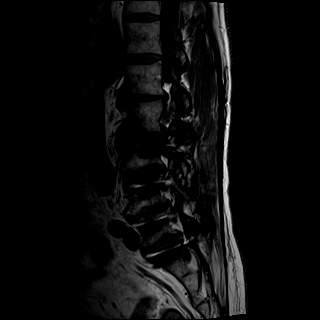
[im 9/17]
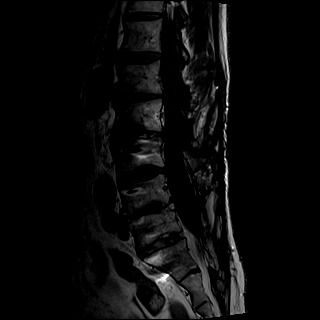
[im 11/17]
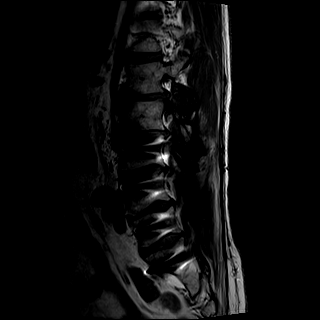
[im 14/17]
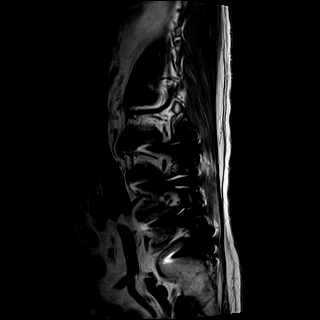
[im 17/17]
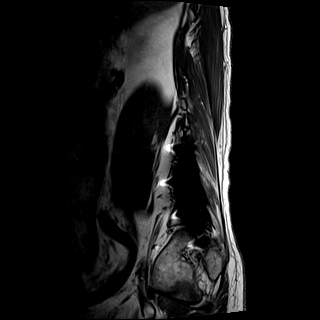

[Series 7: STIR · sagittal · 4.0mm · 0.44mm/px · 2 of 17 slices shown]
[im 1/17]
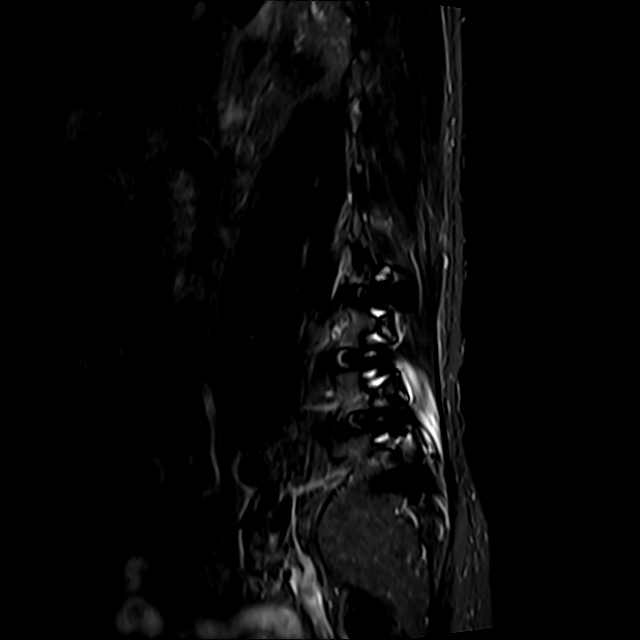
[im 3/17]
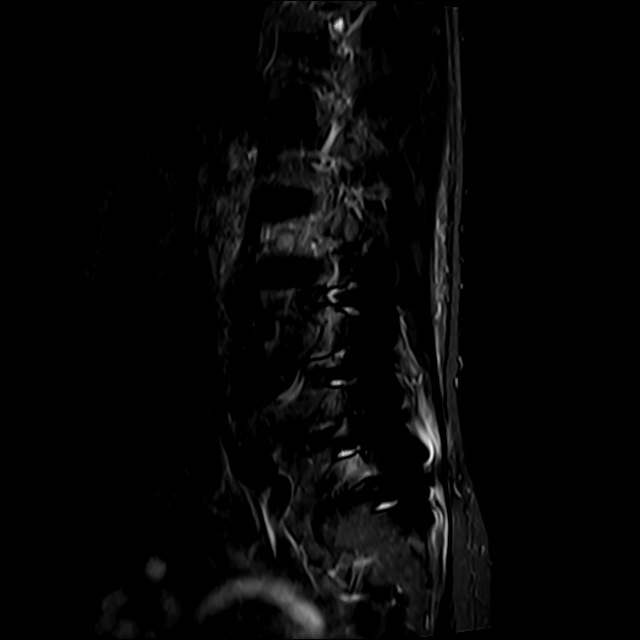

[Series 8: T2 · axial · 4.0mm · 0.78mm/px · z∈[-160,+46]mm · 8 of 36 slices shown (2 of 2)]
[im 1/36]
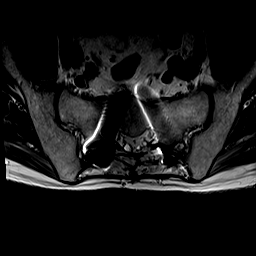
[im 6/36]
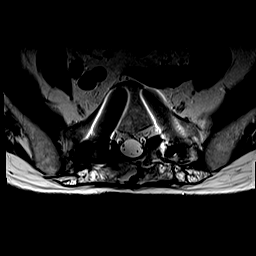
[im 11/36]
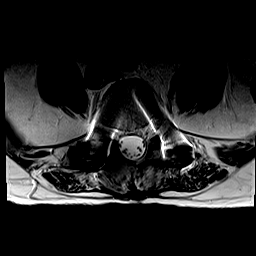
[im 17/36]
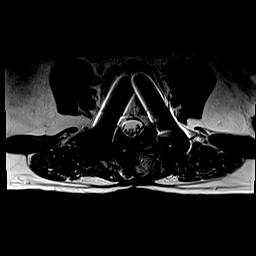
[im 19/36]
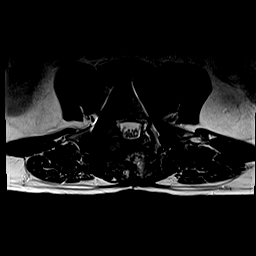
[im 25/36]
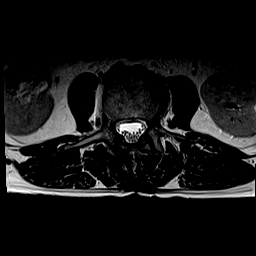
[im 30/36]
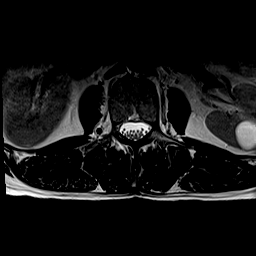
[im 36/36]
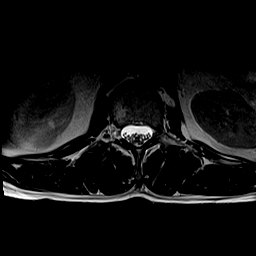

[Series 9: T1 · axial · 4.0mm · 0.39mm/px · z∈[-160,+46]mm · 8 of 36 slices shown (2 of 2)]
[im 1/36]
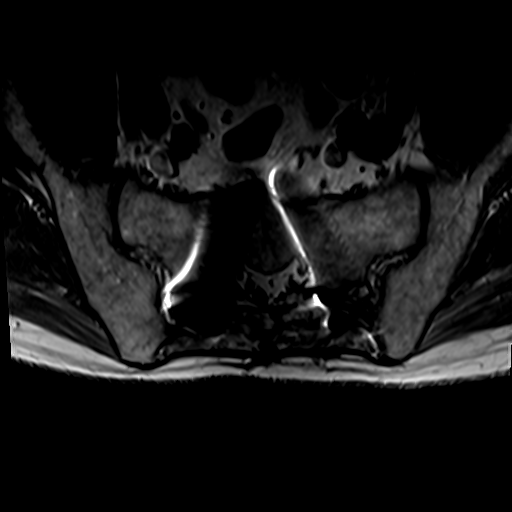
[im 6/36]
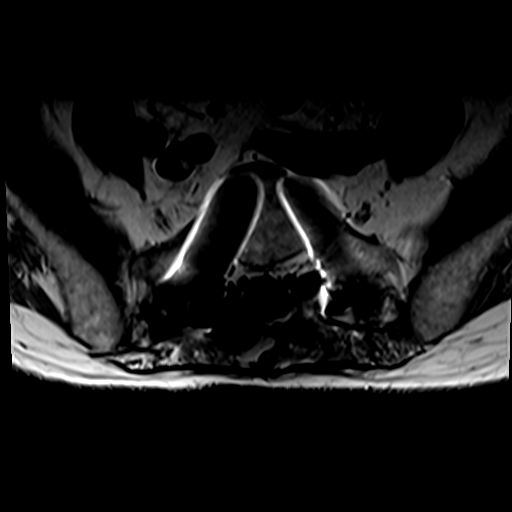
[im 11/36]
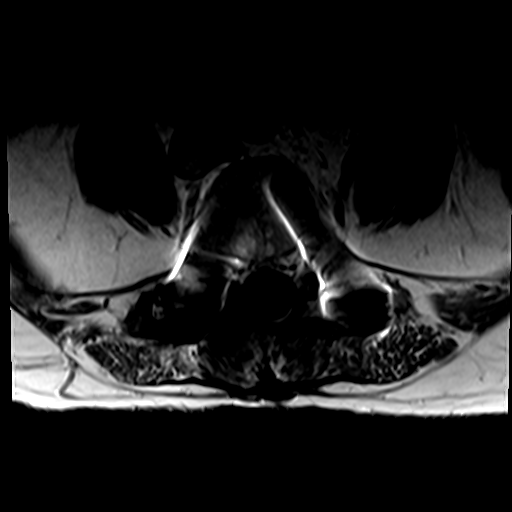
[im 17/36]
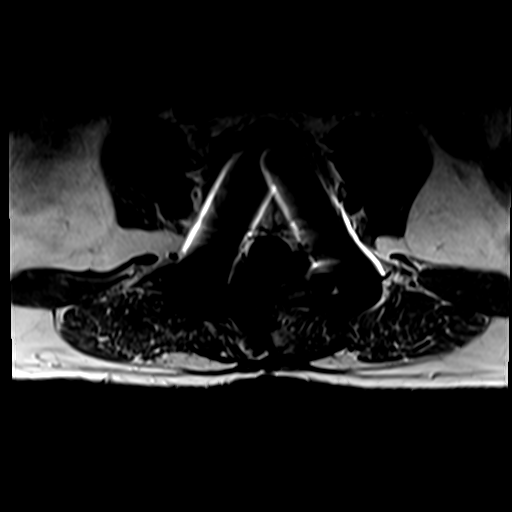
[im 19/36]
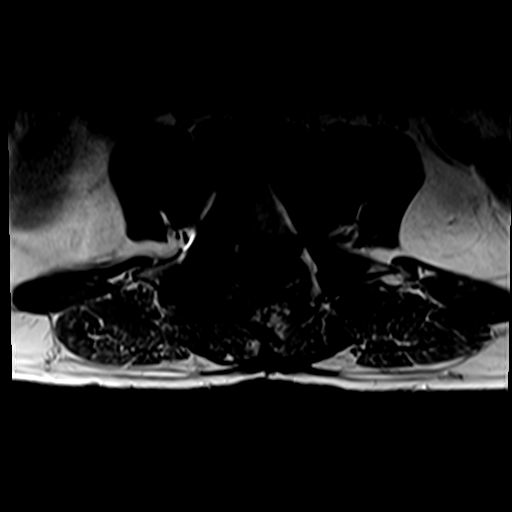
[im 25/36]
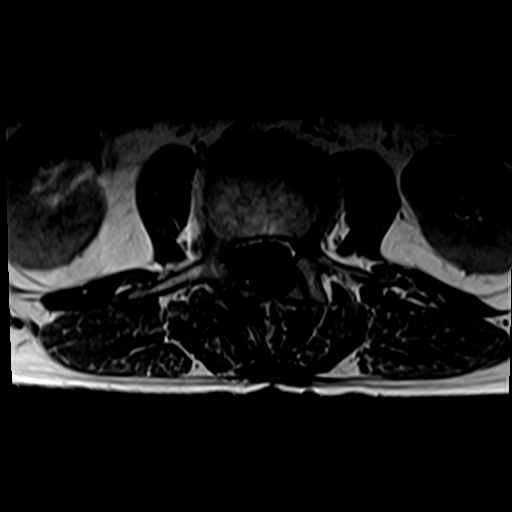
[im 30/36]
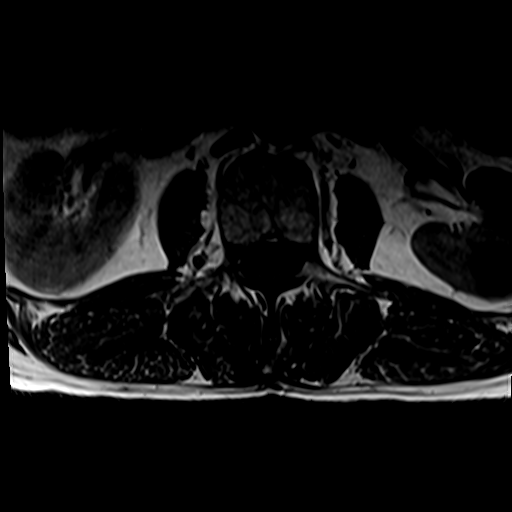
[im 36/36]
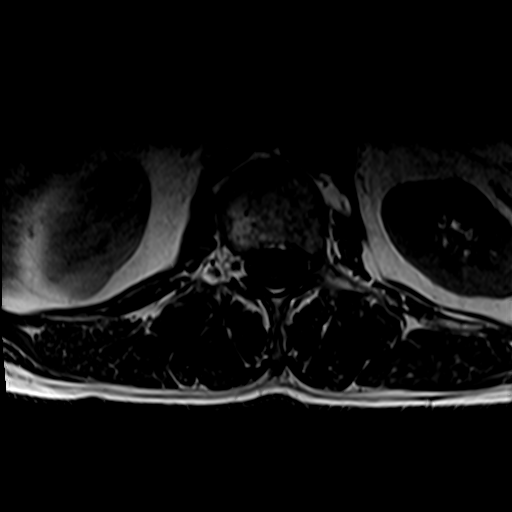

[31 of 48 positions shown; findings below may reference images not displayed]

FINDINGS: Segmentation: There are five lumbar type vertebral bodies. The last
full intervertebral disc space is labeled L5-S1.

Alignment:  Normal

Vertebrae: No acute bony findings or bone lesions. Stable artifact
associated with the fusion hardware from L3-S1.

Conus medullaris and cauda equina: Conus extends to the T12-L1
level. Conus and cauda equina appear normal.

Paraspinal and other soft tissues: No significant paraspinal or
retroperitoneal findings. Left renal cyst is noted.

Disc levels:

T12-L1: No significant findings.

L1-2: Decompressive laminectomy noted. Stable degenerative disc
disease with a bulging annulus and osteophytic ridging. There is
mild flattening of the ventral thecal sac and mild lateral recess
encroachment but no significant spinal or foraminal stenosis.

L2-3: Decompressive laminectomy noted. Diffuse bulging degenerated
annulus, osteophytic ridging and facet disease contributing to mild
bilateral lateral recess stenosis, left greater than right. There is
also a stable broad-based left foraminal disc protrusion which is
displacing the left L2 nerve root.

L3-4: Wide decompressive laminectomy with posterior fusion hardware.
No spinal or foraminal stenosis.

L4-5: Wide decompressive laminectomy. Posterior fusion hardware. No
spinal or foraminal stenosis.

L5-S1: Wide decompressive laminectomy with posterior fusion
hardware. No spinal or foraminal stenosis.
IMPRESSION: 1. Stable postoperative changes with wide decompressive laminectomy
from L1-2 down through L5-S1. No significant spinal stenosis.
2. Stable broad-based left foraminal disc protrusion at L2-3
displacing the left L2 nerve root.

## 2020-08-30 MED ORDER — MORPHINE SULFATE (PF) 4 MG/ML IV SOLN
4.0000 mg | Freq: Once | INTRAVENOUS | Status: AC
  Administered 2020-08-30: 4 mg via INTRAVENOUS
  Filled 2020-08-30: qty 1

## 2020-08-30 MED ORDER — METHYLPREDNISOLONE SODIUM SUCC 125 MG IJ SOLR
125.0000 mg | Freq: Once | INTRAMUSCULAR | Status: AC
  Administered 2020-08-30: 125 mg via INTRAVENOUS
  Filled 2020-08-30: qty 2

## 2020-08-30 MED ORDER — PREDNISONE 10 MG (21) PO TBPK: ORAL_TABLET | ORAL | 0 refills | Status: DC

## 2020-08-30 NOTE — ED Provider Notes (Signed)
[facility]-EMERGENCY DEPARTMENT  ____________________________________________  Time seen: Approximately 7:09 PM  I have reviewed the triage vital signs and the nursing notes.   HISTORY  Chief Complaint Back Pain (/)   Historian Patient    HPI Earl Murray is a 65 y.o. male presents to the emergency department with acute onset of bowel and bladder incontinence for the past 6 days.  Patient phoned his neurosurgeon, Dr. Cari Caraway regarding new onset symptoms who referred him to the emergency department for MRI lumbar spine.  Patient was seen and evaluated on 08/09/2020 for lumbar radiculopathy he had an MRI lumbar spine at that time which showed prior posterior decompression with fusion at L1-S1 and a broad-based left foraminal to extraforaminal disc protrusion at L2-L3 potentially affecting exiting left L2 nerve root.  Patient was sent home on Flexeril and a tapered steroid.   Past Medical History:  Diagnosis Date  . Hypertension   . Neck injury      Immunizations up to date:  Yes.     Past Medical History:  Diagnosis Date  . Hypertension   . Neck injury     Patient Active Problem List   Diagnosis Date Noted  . Abnormal magnetic resonance imaging of liver   . Choledocholithiasis 10/25/2019  . Hypertension   . Hallux valgus, acquired 04/20/2016  . 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 Stress reaction 04/20/2016    Past Surgical History:  Procedure Laterality Date  . BACK SURGERY    . CHOLECYSTECTOMY    . ELBOW SURGERY    . ERCP N/A 10/27/2019   Procedure: ENDOSCOPIC RETROGRADE CHOLANGIOPANCREATOGRAPHY (ERCP);  Surgeon: Lucilla Lame, MD;  Location: [facility] ENDOSCOPY;  Service: Endoscopy;  Laterality: N/A;  . FOOT SURGERY    . HERNIA REPAIR     4 hernia repairs    Prior to Admission medications   Medication Sig Start Date End Date Taking? Authorizing Provider  predniSONE (STERAPRED UNI-PAK 21 TAB) 10 MG (21) TBPK tablet Take 6 tablets the first day, take 5 tablets the  second day, take 4 tablets the third day, take 3 tablets the fourth day, take 2 tablets the fifth day, take 1 tablet the sixth day. 08/30/20  Yes Vallarie Mare M, PA-C  amLODipine (NORVASC) 10 MG tablet Take 10 mg by mouth daily.     [provider]  baclofen (LIORESAL) 10 MG tablet Take 1 tablet (10 mg total) by mouth 3 (three) times daily as needed (Back pain). 03/04/19   Coral Spikes, DO  hydrochlorothiazide (HYDRODIURIL) 25 MG tablet Take 1 tablet (25 mg total) by mouth daily. 08/15/18   Coral Spikes, DO  oxyCODONE (ROXICODONE) 5 MG immediate release tablet Take 1-2 tablets (5-10 mg total) by mouth every 8 (eight) hours as needed for moderate pain or severe pain. 08/09/20 08/09/21  Duffy Bruce, MD  gabapentin (NEURONTIN) 300 MG capsule gabapentin 300 mg capsule  one tab bid-tid  03/01/19  [provider]  ipratropium (ATROVENT) 0.06 % nasal spray Place 2 sprays into both nostrils 4 (four) times daily as needed for rhinitis. 10/22/19 03/16/20  Karen Kitchens, NP  levocetirizine (XYZAL) 5 MG tablet Take 1 tablet (5 mg total) by mouth every evening. 09/30/19 03/16/20  Coral Spikes, DO    Allergies Tylenol with codeine #3 [acetaminophen-codeine], Motrin [ibuprofen], and Tramadol  Family History  Problem Relation Age of Onset  . Hypertension Mother   . Heart disease Mother   . Colon cancer Father     Social  History Social History   Tobacco Use  . Smoking status: Former Smoker    Types: Cigarettes    Quit date: 05/2018    Years since quitting: 2.2  . Smokeless tobacco: Former Network engineer  . Vaping Use: Never used  Substance Use Topics  . Alcohol use: Not Currently  . Drug use: Not Currently     Review of Systems  Constitutional: No fever/chills Eyes:  No discharge ENT: No upper respiratory complaints. Respiratory: no cough. No SOB/ use of accessory muscles to breath Gastrointestinal:   No nausea, no vomiting.  No diarrhea.  No  constipation. Musculoskeletal: Patient has low back pain.  Skin: Negative for rash, abrasions, lacerations, ecchymosis.    ____________________________________________   PHYSICAL EXAM:  VITAL SIGNS: ED Triage Vitals  Enc Vitals Group     BP 08/30/20 1852 (!) 159/87     Pulse Rate 08/30/20 1852 99     Resp 08/30/20 1852 18     Temp 08/30/20 1852 98 F (36.7 C)     Temp Source 08/30/20 1852 Oral     SpO2 08/30/20 1852 100 %     Weight 08/30/20 1849 200 lb 9.9 oz (91 kg)     Height 08/30/20 1849 6\' 5"  (1.956 m)     Head Circumference --      Peak Flow --      Pain Score --      Pain Loc --      Pain Edu? --      Excl. in [place]? --      Constitutional: Alert and oriented. Well appearing and in no acute distress. Eyes: Conjunctivae are normal. PERRL. EOMI. Head: Atraumatic. ENT:      Nose: No congestion/rhinnorhea.      Mouth/Throat: Mucous membranes are moist.  Neck: No stridor.  No cervical spine tenderness to palpation. Cardiovascular: Normal rate, regular rhythm. Normal S1 and S2.  Good peripheral circulation. Respiratory: Normal respiratory effort without tachypnea or retractions. Lungs CTAB. Good air entry to the bases with no decreased or absent breath sounds Gastrointestinal: Bowel sounds x 4 quadrants. Soft and nontender to palpation. No guarding or rigidity. No distention. Musculoskeletal: Patient has symmetric strength in upper and lower extremities.  Full range of motion to all extremities. No obvious deformities noted Neurologic:  Normal for age. No gross focal neurologic deficits are appreciated.  Patient can easily move from a sitting to a supine position. Skin:  Skin is warm, dry and intact. No rash noted. Psychiatric: Mood and affect are normal for age. Speech and behavior are normal.   ____________________________________________   LABS (all labs ordered are listed, but only abnormal results are displayed)  Labs Reviewed  URINALYSIS, COMPLETE (UACMP)  WITH MICROSCOPIC - Abnormal; Notable for the following components:      Result Value   Color, Urine YELLOW (*)    APPearance HAZY (*)    Specific Gravity, Urine 1.033 (*)    All other components within normal limits  CBC WITH DIFFERENTIAL/PLATELET - Abnormal; Notable for the following components:   WBC 11.8 (*)    RDW 15.9 (*)    Lymphs Abs 4.9 (*)    All other components within normal limits  COMPREHENSIVE METABOLIC PANEL   ____________________________________________  EKG   ____________________________________________  RADIOLOGY Unk Pinto, personally viewed and evaluated these images (plain radiographs) as part of my medical decision making, as well as reviewing the written report by the radiologist.    MR LUMBAR SPINE  WO CONTRAST  Result Date: 08/30/2020 CLINICAL DATA:  Low back pain. EXAM: MRI LUMBAR SPINE WITHOUT CONTRAST TECHNIQUE: Multiplanar, multisequence MR imaging of the lumbar spine was performed. No intravenous contrast was administered. COMPARISON:  08/09/2020 FINDINGS: Segmentation: There are five lumbar type vertebral bodies. The last full intervertebral disc space is labeled L5-S1. Alignment:  Normal Vertebrae: No acute bony findings or bone lesions. Stable artifact associated with the fusion hardware from L3-S1. Conus medullaris and cauda equina: Conus extends to the T12-L1 level. Conus and cauda equina appear normal. Paraspinal and other soft tissues: No significant paraspinal or retroperitoneal findings. Left renal cyst is noted. Disc levels: T12-L1: No significant findings. L1-2: Decompressive laminectomy noted. Stable degenerative disc disease with a bulging annulus and osteophytic ridging. There is mild flattening of the ventral thecal sac and mild lateral recess encroachment but no significant spinal or foraminal stenosis. L2-3: Decompressive laminectomy noted. Diffuse bulging degenerated annulus, osteophytic ridging and facet disease contributing to mild  bilateral lateral recess stenosis, left greater than right. There is also a stable broad-based left foraminal disc protrusion which is displacing the left L2 nerve root. L3-4: Wide decompressive laminectomy with posterior fusion hardware. No spinal or foraminal stenosis. L4-5: Wide decompressive laminectomy. Posterior fusion hardware. No spinal or foraminal stenosis. L5-S1: Wide decompressive laminectomy with posterior fusion hardware. No spinal or foraminal stenosis. IMPRESSION: 1. Stable postoperative changes with wide decompressive laminectomy from L1-2 down through L5-S1. No significant spinal stenosis. 2. Stable broad-based left foraminal disc protrusion at L2-3 displacing the left L2 nerve root. Electronically Signed   By: Marijo Sanes M.D.   On: 08/30/2020 20:00    ____________________________________________    PROCEDURES  Procedure(s) performed:     Procedures     Medications  morphine 4 MG/ML injection 4 mg (4 mg Intravenous Given 08/30/20 2020)  methylPREDNISolone sodium succinate (SOLU-MEDROL) 125 mg/2 mL injection 125 mg (125 mg Intravenous Given 08/30/20 2040)     ____________________________________________   INITIAL IMPRESSION / ASSESSMENT AND PLAN / ED COURSE  Pertinent labs & imaging results that were available during my care of the patient were reviewed by me and considered in my medical decision making (see chart for details).      Assessment and Plan: Low back pain:  65 year old male presents to the emergency department with new onset bowel and bladder incontinence for the past 5 to 6 days with worsening low back pain.  Patient was hypertensive at triage but vital signs were otherwise reassuring  Patient had symmetric strength in the upper and lower extremities and was observed standing and ambulating in emergency department.  Repeat MRI was consistent with MRI that was obtained on 08/09/2020.  I reached out to neurosurgeon on-call, Dr. Cari Caraway who was  reassured by MRI results.  Patient was given IV Solu-Medrol in the emergency department as well as morphine for pain.  He was restarted on a 6-day taper of prednisone and advised to follow-up with Dr. Cari Caraway as an outpatient.    ____________________________________________  FINAL CLINICAL IMPRESSION(S) / ED DIAGNOSES  Final diagnoses:  Acute bilateral low back pain with sciatica, sciatica laterality unspecified      NEW MEDICATIONS STARTED DURING THIS VISIT:  ED Discharge Orders         Ordered    predniSONE (STERAPRED UNI-PAK 21 TAB) 10 MG (21) TBPK tablet        08/30/20 2045              This chart was dictated using voice recognition  software/Dragon. Despite best efforts to proofread, errors can occur which can change the meaning. Any change was purely unintentional.     Karren Cobble 08/30/20 2232    Nance Pear, MD 08/30/20 [phone]

## 2020-08-30 NOTE — Discharge Instructions (Signed)
Take tapered steroid as directed.  

## 2020-08-30 NOTE — ED Triage Notes (Signed)
Presents with lower back pain  States he had MRI about 3 weeks ago and was told he had bulging disc  States he has had couple of episodes of uncontrolled bowel over the past week  Ambulates well to treatment room

## 2020-08-30 NOTE — ED Notes (Signed)
Pt presents c/o of lower back pain per triage note. Pt denied any other complaints at this time.

## 2020-08-31 ENCOUNTER — Other Ambulatory Visit: Payer: Self-pay

## 2020-08-31 ENCOUNTER — Encounter: Payer: Self-pay | Admitting: Emergency Medicine

## 2020-08-31 ENCOUNTER — Ambulatory Visit
Admission: EM | Admit: 2020-08-31 | Discharge: 2020-08-31 | Disposition: A | Discharge status: Home / Self Care | Payer: Medicare Other | Attending: Family Medicine | Admitting: Family Medicine

## 2020-08-31 DIAGNOSIS — M545 Low back pain, unspecified: Secondary | ICD-10-CM | POA: Diagnosis not present

## 2020-08-31 DIAGNOSIS — G8929 Other chronic pain: Secondary | ICD-10-CM | POA: Diagnosis not present

## 2020-08-31 MED ORDER — HYDROCODONE-ACETAMINOPHEN 5-325 MG PO TABS
1.0000 | ORAL_TABLET | Freq: Three times a day (TID) | ORAL | 0 refills | Status: AC | PRN
Start: 2020-08-31 — End: 2020-09-02

## 2020-08-31 NOTE — ED Triage Notes (Signed)
Patient states that on March 18 he injured his back and felt a pop in his lower back.  Patient states that he went to the ED on March 18 and yesterday.  Patient states that he had an MRI done.  Patient states that he has 2 herniated disc.  Patient states that he had a shot in the ED and was placed on Prednisone.  Patient states that he has been unable to get in tough with his pain doctor.

## 2020-08-31 NOTE — Discharge Instructions (Signed)
Stop the percocet.   Use the vicodin as directed.  Follow up with your physician.  Take care  Dr. Lacinda Axon

## 2020-09-01 NOTE — ED Provider Notes (Addendum)
[facility]    CSN: [id number] Arrival date & time: 08/31/20  0951  History   Chief Complaint Chief Complaint  Patient presents with  . Back Pain   HPI  65 year old male presents with back pain.  Patient reports that he injured his low back on March 18.  He has recently been seen in the ER.  Most recently on 4/8.  He has had recent MRI.  Patient is on chronic narcotics.  He states that his Percocet is now making his stomach upset.  He is complaining of severe pain.  He has an upcoming appointment with his neurosurgeon.  He states that he would like something different for pain while awaiting his appointment on Monday.  Past Medical History:  Diagnosis Date  . Hypertension   . Neck injury     Patient Active Problem List   Diagnosis Date Noted  . Abnormal magnetic resonance imaging of liver   . Choledocholithiasis 10/25/2019  . Hypertension   . Hallux valgus, acquired 04/20/2016  . 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 Stress reaction 04/20/2016    Past Surgical History:  Procedure Laterality Date  . BACK SURGERY    . CHOLECYSTECTOMY    . ELBOW SURGERY    . ERCP N/A 10/27/2019   Procedure: ENDOSCOPIC RETROGRADE CHOLANGIOPANCREATOGRAPHY (ERCP);  Surgeon: Lucilla Lame, MD;  Location: [facility] ENDOSCOPY;  Service: Endoscopy;  Laterality: N/A;  . FOOT SURGERY    . HERNIA REPAIR     4 hernia repairs       Home Medications    Prior to Admission medications   Medication Sig Start Date End Date Taking? Authorizing Provider  amLODipine (NORVASC) 10 MG tablet Take 10 mg by mouth daily.    Yes [provider]  baclofen (LIORESAL) 10 MG tablet Take 1 tablet (10 mg total) by mouth 3 (three) times daily as needed (Back pain). 03/04/19  Yes Mckinzie Saksa G, DO  hydrochlorothiazide (HYDRODIURIL) 25 MG tablet Take 1 tablet (25 mg total) by mouth daily. 08/15/18  Yes Jaslyn Bansal G, DO  HYDROcodone-acetaminophen (NORCO/VICODIN) 5-325 MG tablet Take 1-2 tablets by mouth  every 8 (eight) hours as needed for up to 2 days. 08/31/20 09/02/20 Yes Delvin Hedeen G, DO  predniSONE (STERAPRED UNI-PAK 21 TAB) 10 MG (21) TBPK tablet Take 6 tablets the first day, take 5 tablets the second day, take 4 tablets the third day, take 3 tablets the fourth day, take 2 tablets the fifth day, take 1 tablet the sixth day. 08/30/20  Yes Vallarie Mare M, PA-C  gabapentin (NEURONTIN) 300 MG capsule gabapentin 300 mg capsule  one tab bid-tid  03/01/19  [provider]  ipratropium (ATROVENT) 0.06 % nasal spray Place 2 sprays into both nostrils 4 (four) times daily as needed for rhinitis. 10/22/19 03/16/20  Karen Kitchens, NP  levocetirizine (XYZAL) 5 MG tablet Take 1 tablet (5 mg total) by mouth every evening. 09/30/19 03/16/20  Coral Spikes, DO    Family History Family History  Problem Relation Age of Onset  . Hypertension Mother   . Heart disease Mother   . Colon cancer Father     Social History Social History   Tobacco Use  . Smoking status: Former Smoker    Types: Cigarettes    Quit date: 05/2018    Years since quitting: 2.2  . Smokeless tobacco: Former Network engineer  . Vaping Use: Never used  Substance Use Topics  . Alcohol use: Not Currently  .  Drug use: Not Currently     Allergies   Tylenol with codeine #3 [acetaminophen-codeine], Motrin [ibuprofen], and Tramadol   Review of Systems Review of Systems Per HPI  Physical Exam Triage Vital Signs ED Triage Vitals  Enc Vitals Group     BP 08/31/20 1008 (!) 155/86     Pulse Rate 08/31/20 1008 100     Resp 08/31/20 1008 16     Temp 08/31/20 1008 98.2 F (36.8 C)     Temp Source 08/31/20 1008 Oral     SpO2 08/31/20 1008 99 %     Weight 08/31/20 1006 200 lb (90.7 kg)     Height 08/31/20 1006 6\' 5"  (1.956 m)     Head Circumference --      Peak Flow --      Pain Score 08/31/20 1120 1     Pain Loc --      Pain Edu? --      Excl. in [place]? --    Updated Vital Signs BP (!) 155/86 (BP Location: Right Arm)    Pulse 100   Temp 98.2 F (36.8 C) (Oral)   Resp 16   Ht 6\' 5"  (1.956 m)   Wt 90.7 kg   SpO2 99%   BMI 23.72 kg/m   Visual Acuity Right Eye Distance:   Left Eye Distance:   Bilateral Distance:    Right Eye Near:   Left Eye Near:    Bilateral Near:     Physical Exam Vitals and nursing note reviewed.  Constitutional:      General: He is not in acute distress.    Appearance: Normal appearance. He is not ill-appearing.  HENT:     Head: Normocephalic and atraumatic.  Eyes:     General:        Right eye: No discharge.        Left eye: No discharge.     Conjunctiva/sclera: Conjunctivae normal.  Pulmonary:     Effort: Pulmonary effort is normal. No respiratory distress.  Musculoskeletal:     Comments: Lumbar spine - paraspinal muscular tenderness to palpation.  Neurological:     Mental Status: He is alert.  Psychiatric:        Mood and Affect: Mood normal.        Behavior: Behavior normal.    UC Treatments / Results  Labs (all labs ordered are listed, but only abnormal results are displayed) Labs Reviewed - No data to display  EKG   Radiology MR LUMBAR SPINE WO CONTRAST  Result Date: 08/30/2020 CLINICAL DATA:  Low back pain. EXAM: MRI LUMBAR SPINE WITHOUT CONTRAST TECHNIQUE: Multiplanar, multisequence MR imaging of the lumbar spine was performed. No intravenous contrast was administered. COMPARISON:  08/09/2020 FINDINGS: Segmentation: There are five lumbar type vertebral bodies. The last full intervertebral disc space is labeled L5-S1. Alignment:  Normal Vertebrae: No acute bony findings or bone lesions. Stable artifact associated with the fusion hardware from L3-S1. Conus medullaris and cauda equina: Conus extends to the T12-L1 level. Conus and cauda equina appear normal. Paraspinal and other soft tissues: No significant paraspinal or retroperitoneal findings. Left renal cyst is noted. Disc levels: T12-L1: No significant findings. L1-2: Decompressive laminectomy noted.  Stable degenerative disc disease with a bulging annulus and osteophytic ridging. There is mild flattening of the ventral thecal sac and mild lateral recess encroachment but no significant spinal or foraminal stenosis. L2-3: Decompressive laminectomy noted. Diffuse bulging degenerated annulus, osteophytic ridging and facet disease contributing to mild bilateral  lateral recess stenosis, left greater than right. There is also a stable broad-based left foraminal disc protrusion which is displacing the left L2 nerve root. L3-4: Wide decompressive laminectomy with posterior fusion hardware. No spinal or foraminal stenosis. L4-5: Wide decompressive laminectomy. Posterior fusion hardware. No spinal or foraminal stenosis. L5-S1: Wide decompressive laminectomy with posterior fusion hardware. No spinal or foraminal stenosis. IMPRESSION: 1. Stable postoperative changes with wide decompressive laminectomy from L1-2 down through L5-S1. No significant spinal stenosis. 2. Stable broad-based left foraminal disc protrusion at L2-3 displacing the left L2 nerve root. Electronically Signed   By: Marijo Sanes M.D.   On: 08/30/2020 20:00    Procedures Procedures (including critical care time)  Medications Ordered in UC Medications - No data to display  Initial Impression / Assessment and Plan / UC Course  I have reviewed the triage vital signs and the nursing notes.  Pertinent labs & imaging results that were available during my care of the patient were reviewed by me and considered in my medical decision making (see chart for details).    65 year old male presents with acute on chronic low back pain.  Patient states that his chronic Percocet is making his stomach upset.  He is followed by provider who provides his chronic pain medication.  I have stopped the Percocet.  Giving a brief supply of hydrocodone.  Advise follow-up with his physicians.  Final Clinical Impressions(s) / UC Diagnoses   Final diagnoses:  Chronic  low back pain, unspecified back pain laterality, unspecified whether sciatica present     Discharge Instructions     Stop the percocet.   Use the vicodin as directed.  Follow up with your physician.  Take care  Dr. Lacinda Axon    ED Prescriptions    Medication Sig Dispense Auth. Provider   HYDROcodone-acetaminophen (NORCO/VICODIN) 5-325 MG tablet Take 1-2 tablets by mouth every 8 (eight) hours as needed for up to 2 days. 12 tablet Thersa Salt G, DO     I have reviewed the PDMP during this encounter.   Coral Spikes, DO 09/01/20 [place], [place], DO 09/01/20 [phone]

## 2020-09-19 ENCOUNTER — Other Ambulatory Visit: Payer: Self-pay

## 2020-09-19 ENCOUNTER — Emergency Department: Payer: Medicare Other

## 2020-09-19 ENCOUNTER — Emergency Department
Admission: EM | Admit: 2020-09-19 | Discharge: 2020-09-19 | Disposition: A | Discharge status: Home / Self Care | Payer: Medicare Other | Attending: Emergency Medicine | Admitting: Emergency Medicine

## 2020-09-19 DIAGNOSIS — I10 Essential (primary) hypertension: Secondary | ICD-10-CM | POA: Insufficient documentation

## 2020-09-19 DIAGNOSIS — M549 Dorsalgia, unspecified: Secondary | ICD-10-CM

## 2020-09-19 DIAGNOSIS — Z87891 Personal history of nicotine dependence: Secondary | ICD-10-CM | POA: Insufficient documentation

## 2020-09-19 DIAGNOSIS — M542 Cervicalgia: Secondary | ICD-10-CM | POA: Insufficient documentation

## 2020-09-19 DIAGNOSIS — Z9889 Other specified postprocedural states: Secondary | ICD-10-CM | POA: Diagnosis not present

## 2020-09-19 DIAGNOSIS — M545 Low back pain, unspecified: Secondary | ICD-10-CM | POA: Diagnosis not present

## 2020-09-19 DIAGNOSIS — R200 Anesthesia of skin: Secondary | ICD-10-CM | POA: Diagnosis not present

## 2020-09-19 DIAGNOSIS — Z79899 Other long term (current) drug therapy: Secondary | ICD-10-CM | POA: Insufficient documentation

## 2020-09-19 DIAGNOSIS — Y92828 Other wilderness area as the place of occurrence of the external cause: Secondary | ICD-10-CM | POA: Diagnosis not present
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
## 2020-09-19 IMAGING — CT CT HEAD W/O CM
3 series · 14 of 47 positions shown, 16 images · non-contrast
Comparison: [DATE]

CLINICAL DATA: Head and neck trauma after flipping over riding
mower. Headache and neck pain. No loss of consciousness.

EXAM:
CT HEAD WITHOUT CONTRAST
CT CERVICAL SPINE WITHOUT CONTRAST
TECHNIQUE: Multidetector CT imaging of the head and cervical spine was
performed following the standard protocol without intravenous
contrast. Multiplanar CT image reconstructions of the cervical spine
were also generated.

[Series 2: head wo · axial · 0.45mm/px · z∈[-87,+43]mm · 8 of 32 slices shown, 10 images]
[im 3/32  brain]
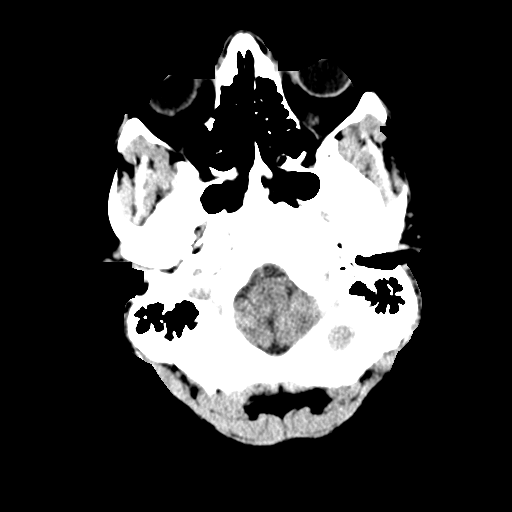
[im 3/32  bone]
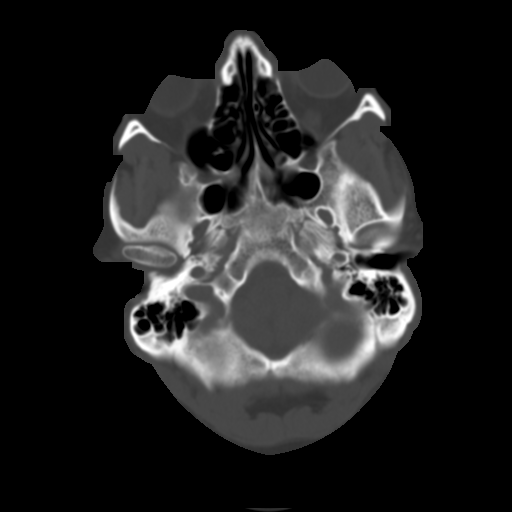
[im 7/32  brain]
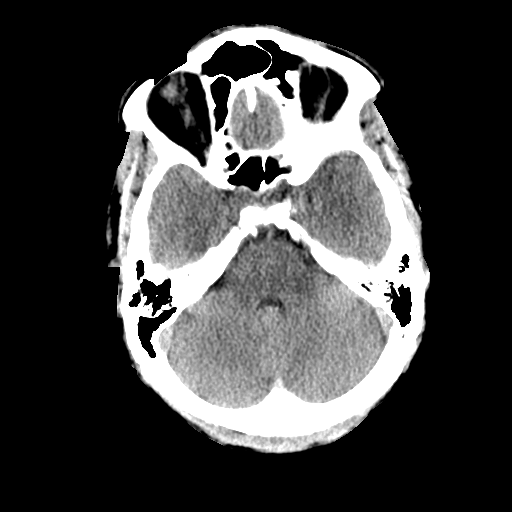
[im 10/32  brain]
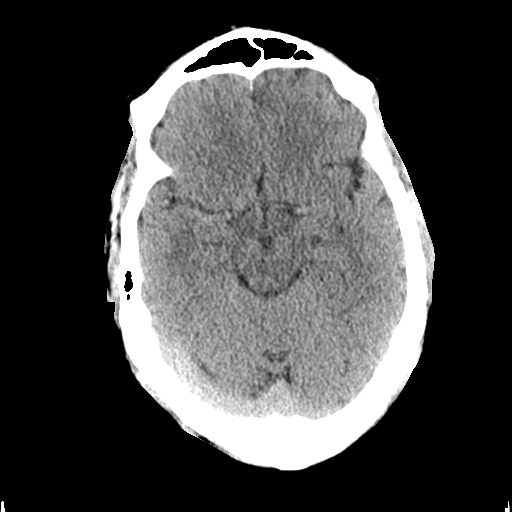
[im 14/32  brain]
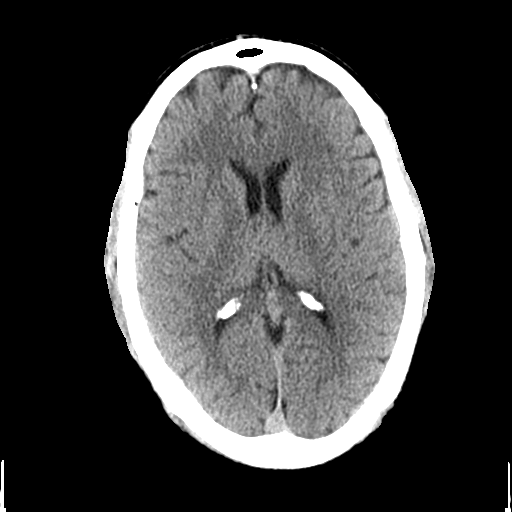
[im 18/32  brain]
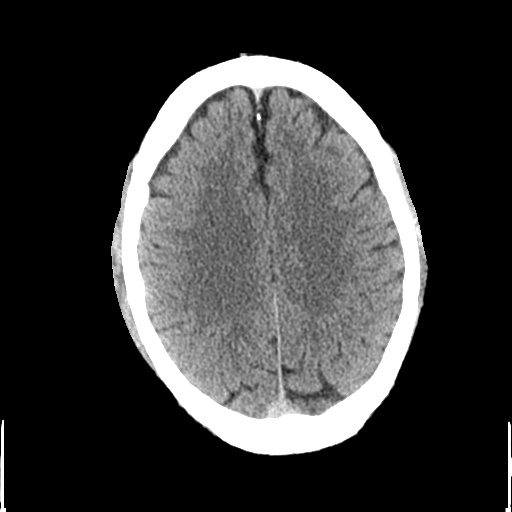
[im 18/32  bone]
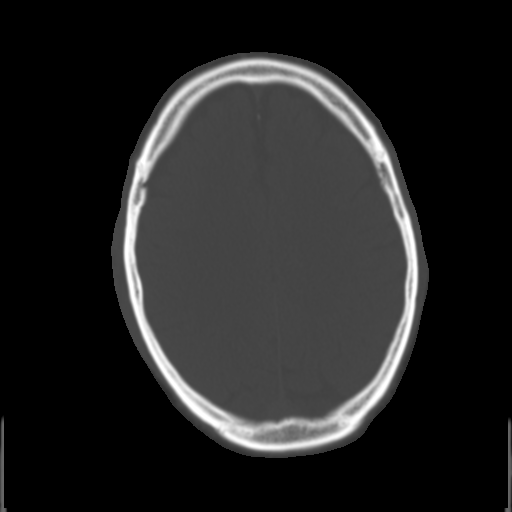
[im 22/32  brain]
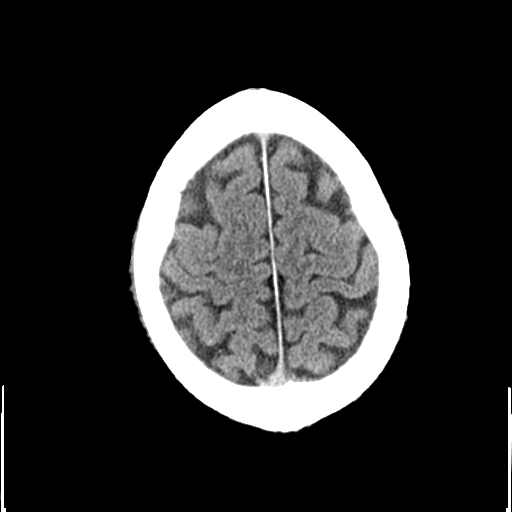
[im 25/32  brain]
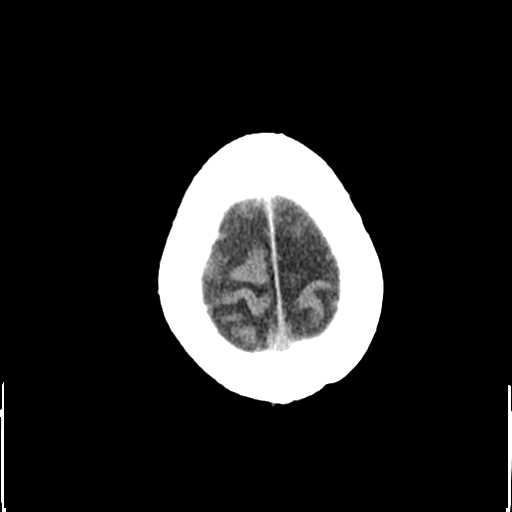
[im 29/32  brain]
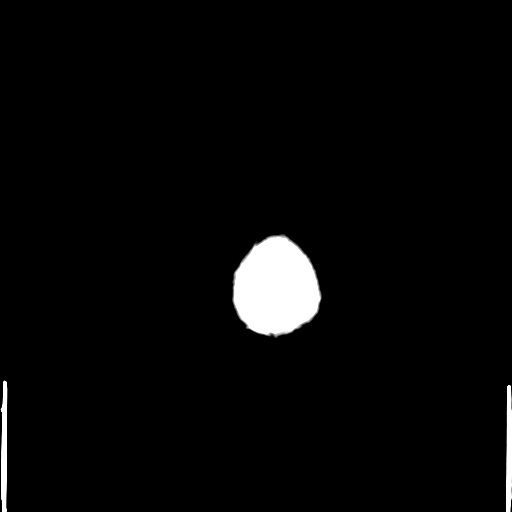

[Series 4: coronal soft tissue · coronal · 0.31mm/px · 3 of 69 slices shown]
[im 23/69  brain]
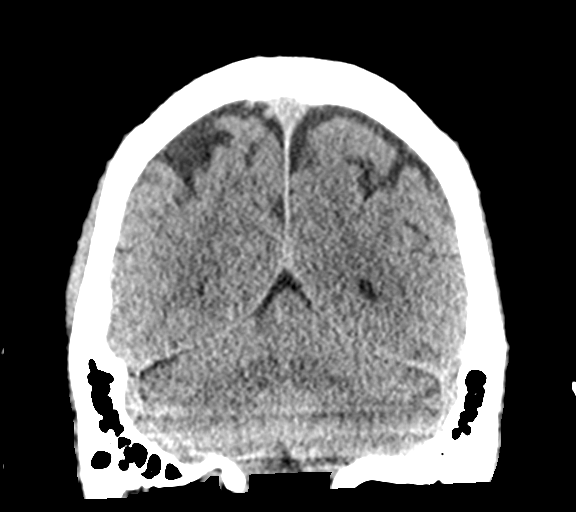
[im 31/69  brain]
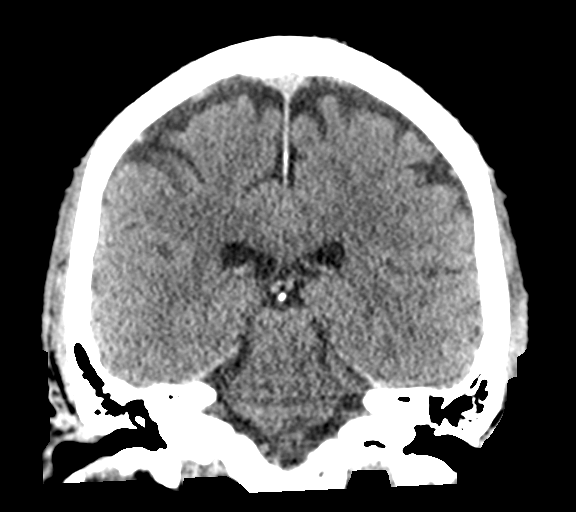
[im 38/69  brain]
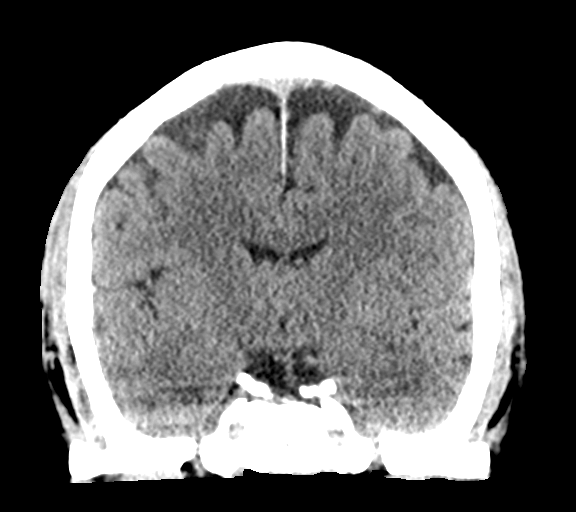

[Series 5: sagittal soft tissue · sagittal · 0.31mm/px · 3 of 56 slices shown]
[im 19/56  brain]
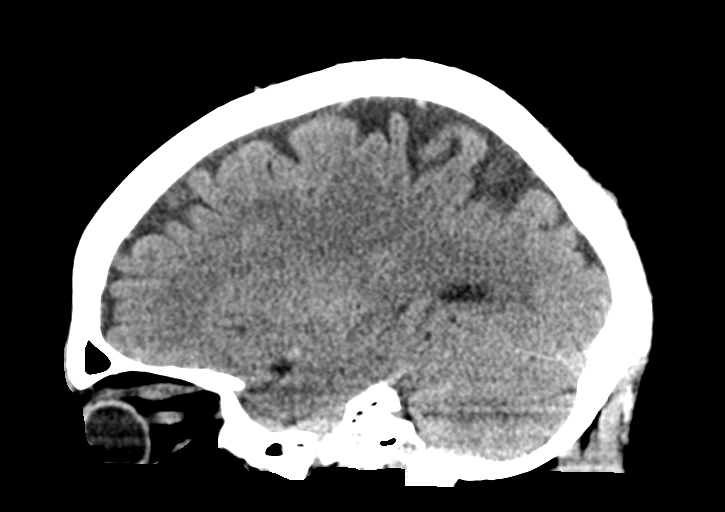
[im 28/56  brain]
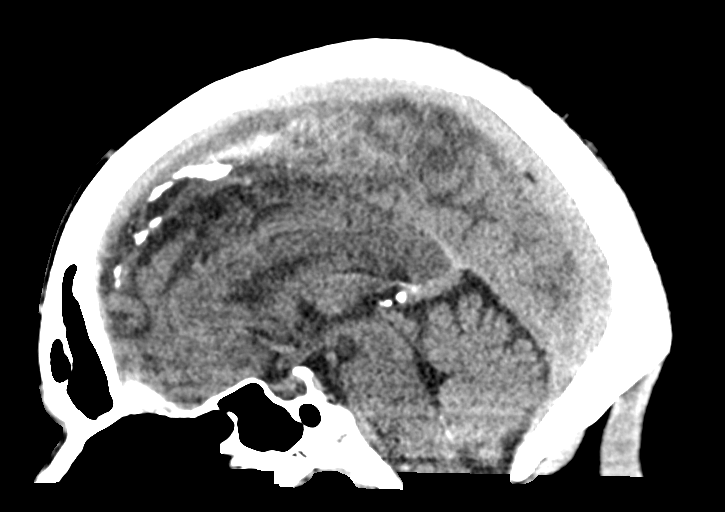
[im 37/56  brain]
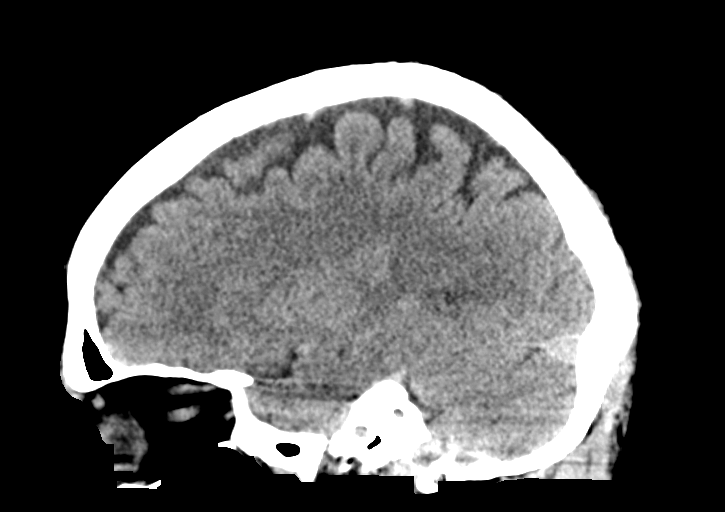

[14 of 47 positions shown; findings below may reference images not displayed]

FINDINGS: CT HEAD FINDINGS

Brain: No evidence of acute infarction, hemorrhage, hydrocephalus,
extra-axial collection or mass lesion/mass effect. Mild cerebral
atrophy.

Vascular: No hyperdense vessel or unexpected calcification.

Skull: Calvarium appears intact.

Sinuses/Orbits: Paranasal sinuses and mastoid air cells are clear.

Other: None.

CT CERVICAL SPINE FINDINGS

Alignment: Reversal of the usual cervical lordosis, unchanged since
prior study. This is likely due to patient positioning or
degenerative change although muscle spasm could also have this
appearance. No anterior subluxations. Normal alignment of the
posterior elements and facet joints.

Skull base and vertebrae: Skull base appears intact. No vertebral
compression deformities. No focal bone lesion or bone destruction.
Bone cortex appears intact.

Soft tissues and spinal canal: No prevertebral soft tissue swelling.
No abnormal paraspinal soft tissue mass or infiltration.

Disc levels: Moderate degenerative changes with disc space narrowing
and endplate hypertrophic change at C5-6, C6-7, and C7-T1 levels.

Upper chest: Visualized lung apices are clear.

Other: None.
IMPRESSION: 1. No acute intracranial abnormalities. Mild cerebral atrophy.
2. Nonspecific reversal of the usual cervical lordosis. Degenerative
changes in the cervical spine. No acute displaced fractures
identified.

## 2020-09-19 MED ORDER — MORPHINE SULFATE (PF) 4 MG/ML IV SOLN
4.0000 mg | Freq: Once | INTRAVENOUS | Status: AC
  Administered 2020-09-19: 4 mg via INTRAMUSCULAR
  Filled 2020-09-19: qty 1

## 2020-09-19 MED ORDER — OXYCODONE-ACETAMINOPHEN 5-325 MG PO TABS
1.0000 | ORAL_TABLET | ORAL | 0 refills | Status: DC | PRN
Start: 2020-09-19 — End: 2020-10-03

## 2020-09-19 NOTE — ED Notes (Signed)
Patient instructed to wait 15 minutes after receiving IM injection. Daughter called for ride home. Will review discharge paperwork.

## 2020-09-19 NOTE — ED Triage Notes (Addendum)
Pt has hx of chronic back pain, was on a ride mower yesterday and he turned it over going down a hill. No is having worsening lower back pain and neck pain. Pt also co pain to head, no loc.

## 2020-09-19 NOTE — ED Provider Notes (Signed)
[facility] Emergency Department Provider Note  Time seen: 9:51 PM  I have reviewed the triage vital signs and the nursing notes.   HISTORY  Chief Complaint Back Pain   HPI Earl Murray is a 65 y.o. male with a past medical history of hypertension, chronic back pain, presents to the emergency department for worsening back pain.  According to the patient he has a history of chronic back pain and has had multiple lower back surgeries.  States he was on his riding lawnmower yesterday when he was going down a hill and the mower almost tipped over causing him to fall off the lawnmower.  Patient states since that time he has had worsening neck and back pain.  Denies LOC.  Patient does state some lower extremity numbness but states has been ongoing for the past month or 2 and he has a follow-up appointment with neurosurgery in 2 weeks.  Denies any incontinence.  Has been ambulatory since the event.  Here the patient appears well, no distress.  C-collar in place.   Past Medical History:  Diagnosis Date  . Hypertension   . Neck injury     Patient Active Problem List   Diagnosis Date Noted  . Abnormal magnetic resonance imaging of liver   . Choledocholithiasis 10/25/2019  . Hypertension   . Hallux valgus, acquired 04/20/2016  . 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 Stress reaction 04/20/2016    Past Surgical History:  Procedure Laterality Date  . BACK SURGERY    . CHOLECYSTECTOMY    . ELBOW SURGERY    . ERCP N/A 10/27/2019   Procedure: ENDOSCOPIC RETROGRADE CHOLANGIOPANCREATOGRAPHY (ERCP);  Surgeon: Lucilla Lame, MD;  Location: [facility] ENDOSCOPY;  Service: Endoscopy;  Laterality: N/A;  . FOOT SURGERY    . HERNIA REPAIR     4 hernia repairs    Prior to Admission medications   Medication Sig Start Date End Date Taking? Authorizing Provider  amLODipine (NORVASC) 10 MG tablet Take 10 mg by mouth daily.     [provider]  baclofen (LIORESAL) 10 MG tablet  Take 1 tablet (10 mg total) by mouth 3 (three) times daily as needed (Back pain). 03/04/19   Coral Spikes, DO  hydrochlorothiazide (HYDRODIURIL) 25 MG tablet Take 1 tablet (25 mg total) by mouth daily. 08/15/18   Coral Spikes, DO  predniSONE (STERAPRED UNI-PAK 21 TAB) 10 MG (21) TBPK tablet Take 6 tablets the first day, take 5 tablets the second day, take 4 tablets the third day, take 3 tablets the fourth day, take 2 tablets the fifth day, take 1 tablet the sixth day. 08/30/20   Lannie Fields, PA-C  gabapentin (NEURONTIN) 300 MG capsule gabapentin 300 mg capsule  one tab bid-tid  03/01/19  [provider]  ipratropium (ATROVENT) 0.06 % nasal spray Place 2 sprays into both nostrils 4 (four) times daily as needed for rhinitis. 10/22/19 03/16/20  Karen Kitchens, NP  levocetirizine (XYZAL) 5 MG tablet Take 1 tablet (5 mg total) by mouth every evening. 09/30/19 03/16/20  Coral Spikes, DO    Allergies  Allergen Reactions  . Tylenol With Codeine #3 [Acetaminophen-Codeine] Itching  . Motrin [Ibuprofen] Rash  . Tramadol Rash    Family History  Problem Relation Age of Onset  . Hypertension Mother   . Heart disease Mother   . Colon cancer Father     Social History Social History   Tobacco Use  . Smoking status: Former Smoker  Types: Cigarettes    Quit date: 05/2018    Years since quitting: 2.3  . Smokeless tobacco: Former Network engineer  . Vaping Use: Never used  Substance Use Topics  . Alcohol use: Not Currently  . Drug use: Not Currently    Review of Systems Constitutional: Negative for fever. Cardiovascular: Negative for chest pain. Respiratory: Negative for shortness of breath. Gastrointestinal: Negative for abdominal pain Musculoskeletal: Lower back pain.  Neck pain.  Describes lower back pain is moderate worse with movement.  Neck pain is mild to moderate worse with movement Neurological: Negative for headache.  States some numbness in both of his legs which has been  an ongoing issue for the past 2 months.  No acute worsening.  No incontinence. All other ROS negative  ____________________________________________   PHYSICAL EXAM:  VITAL SIGNS: ED Triage Vitals  Enc Vitals Group     BP 09/19/20 1945 (!) 172/106     Pulse Rate 09/19/20 1945 96     Resp 09/19/20 1945 20     Temp 09/19/20 1945 98.6 F (37 C)     Temp Source 09/19/20 1945 Oral     SpO2 09/19/20 1945 97 %     Weight 09/19/20 1936 182 lb (82.6 kg)     Height 09/19/20 1936 5\' 11"  (1.803 m)     Head Circumference --      Peak Flow --      Pain Score 09/19/20 1936 8     Pain Loc --      Pain Edu? --      Excl. in [place]? --    Constitutional: Alert and oriented. Well appearing and in no distress. Eyes: Normal exam ENT      Head: Normocephalic and atraumatic.      Mouth/Throat: Mucous membranes are moist. Cardiovascular: Normal rate, regular rhythm.  Respiratory: Normal respiratory effort without tachypnea nor retractions. Breath sounds are clear Gastrointestinal: Soft and nontender. No distention.   Musculoskeletal:  Patient has scarring to lower back from prior back surgeries.  Mild to moderate tenderness across the lumbar spine mostly in the paraspinal muscles.  Mild C-spine tenderness.  C-collar in place. Neurologic:  Normal speech and language. No gross focal neurologic deficits Skin:  Skin is warm, dry and intact.  Psychiatric: Mood and affect are normal.   ____________________________________________     RADIOLOGY  CT scans of the head and neck are negative for acute abnormality.  ____________________________________________   INITIAL IMPRESSION / ASSESSMENT AND PLAN / ED COURSE  Pertinent labs & imaging results that were available during my care of the patient were reviewed by me and considered in my medical decision making (see chart for details).   Patient presents emergency department for worsening neck and back pain after falling off his lawn more yesterday.   Patient does have a history of neck and back pain chronically.  Patient states some numbness in both of his legs which has been an ongoing issue for the past several months has follow-up appoint with Dr. Cari Caraway on 12 May.  Suspect likely exacerbation of chronic back pain.  We will treat with pain medication to be used as needed for the next several days.  I discussed return precautions for any increased weakness numbness or incontinence.  Patient agreeable to plan of care.  Earl Murray was evaluated in Emergency Department on 09/19/2020 for the symptoms described in the history of present illness. He was evaluated in the context of the global COVID-19 pandemic,  which necessitated consideration that the patient might be at risk for infection with the SARS-CoV-2 virus that causes COVID-19. Institutional protocols and algorithms that pertain to the evaluation of patients at risk for COVID-19 are in a state of rapid change based on information released by regulatory bodies including the CDC and federal and state organizations. These policies and algorithms were followed during the patient's care in the ED.  ____________________________________________   FINAL CLINICAL IMPRESSION(S) / ED DIAGNOSES  Acute on chronic back pain   Harvest Dark, MD 09/19/20 2227

## 2020-09-19 NOTE — ED Notes (Addendum)
C collar placed in triage

## 2020-10-03 ENCOUNTER — Ambulatory Visit (INDEPENDENT_AMBULATORY_CARE_PROVIDER_SITE_OTHER): Admit: 2020-10-03 | Discharge: 2020-10-03 | Disposition: A | Discharge status: Home / Self Care | Payer: Medicare Other

## 2020-10-03 ENCOUNTER — Other Ambulatory Visit: Payer: Self-pay

## 2020-10-03 ENCOUNTER — Ambulatory Visit (INDEPENDENT_AMBULATORY_CARE_PROVIDER_SITE_OTHER): Payer: Medicare Other

## 2020-10-03 ENCOUNTER — Ambulatory Visit
Admission: EM | Admit: 2020-10-03 | Discharge: 2020-10-03 | Disposition: A | Discharge status: Home / Self Care | Payer: Medicare Other | Attending: Emergency Medicine | Admitting: Emergency Medicine

## 2020-10-03 DIAGNOSIS — M542 Cervicalgia: Secondary | ICD-10-CM

## 2020-10-03 DIAGNOSIS — W19XXXA Unspecified fall, initial encounter: Secondary | ICD-10-CM

## 2020-10-03 DIAGNOSIS — M25561 Pain in right knee: Secondary | ICD-10-CM

## 2020-10-03 DIAGNOSIS — S8991XA Unspecified injury of right lower leg, initial encounter: Secondary | ICD-10-CM

## 2020-10-03 IMAGING — CT CT CERVICAL SPINE W/O CM
2 series · 10 of 14 positions shown, 12 images · non-contrast
Comparison: Cervical spine CT dated [DATE].

CLINICAL DATA: 65-year-old male with neck trauma.

EXAM:
CT CERVICAL SPINE WITHOUT CONTRAST
TECHNIQUE: Multidetector CT imaging of the cervical spine was performed without
intravenous contrast. Multiplanar CT image reconstructions were also
generated.

[Series 3: c spine soft · axial · 0.35mm/px · z∈[-323,-203]mm · 4 of 102 slices shown]
[im 21/102  soft-tissue]
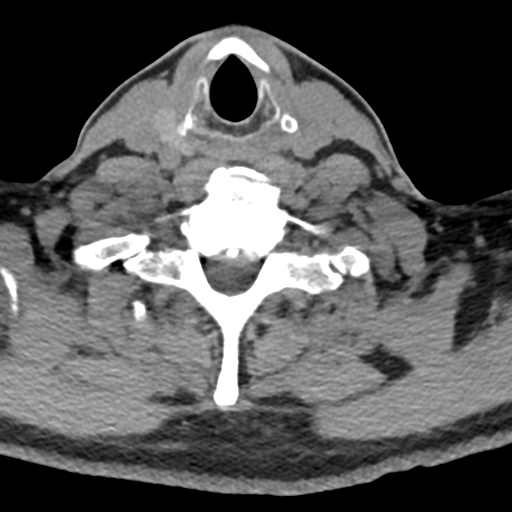
[im 41/102  soft-tissue]
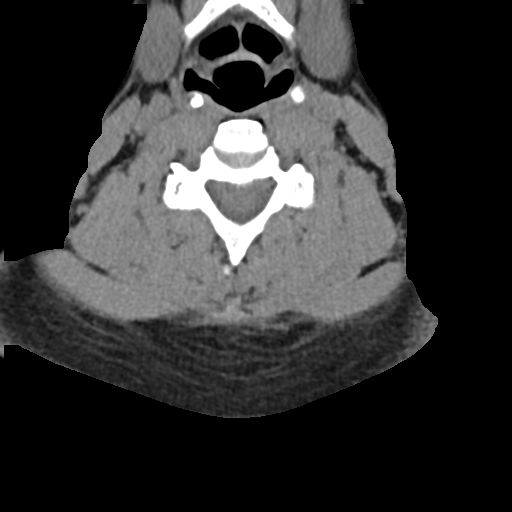
[im 61/102  soft-tissue]
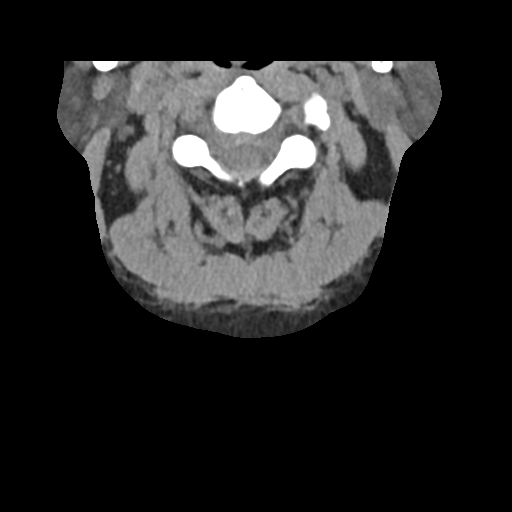
[im 81/102  soft-tissue]
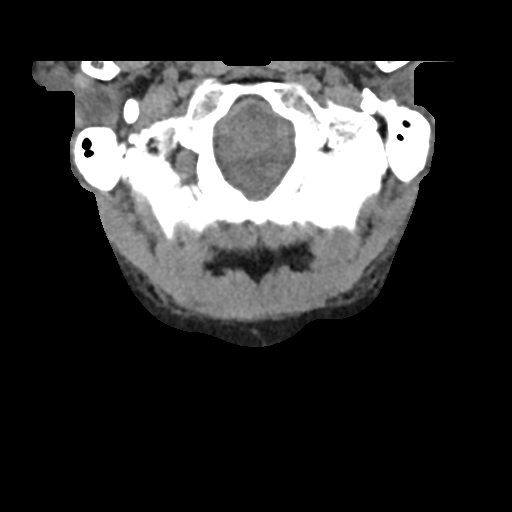

[Series 6: orthogonal bone · axial · 0.23mm/px · z∈[-376,-200]mm · 6 of 133 slices shown, 8 images]
[im 19/133  soft-tissue]
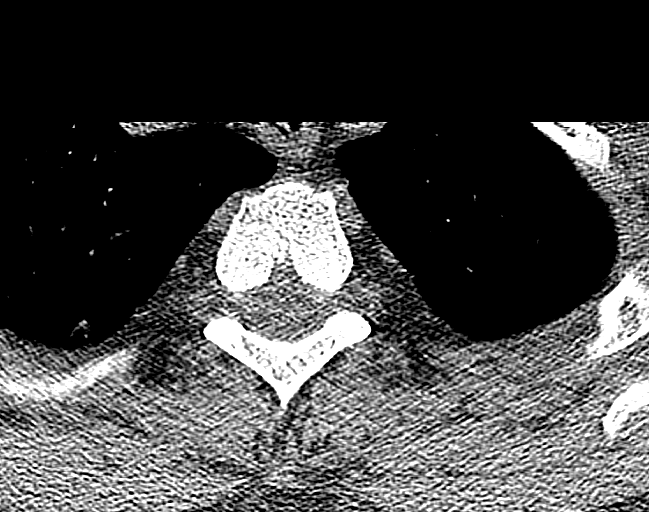
[im 19/133  bone]
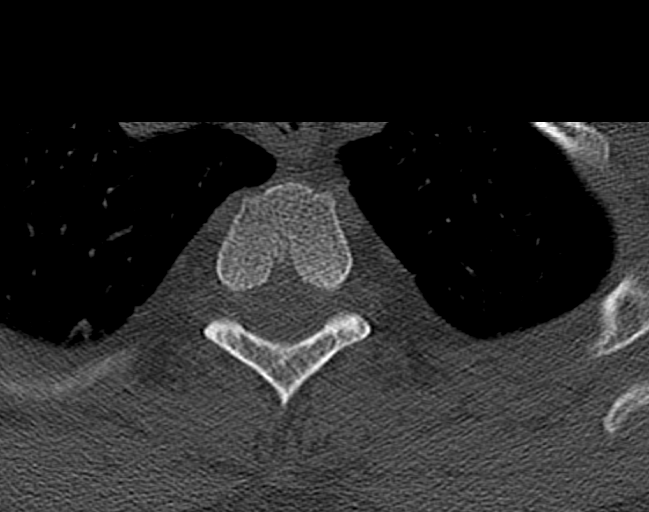
[im 38/133  bone]
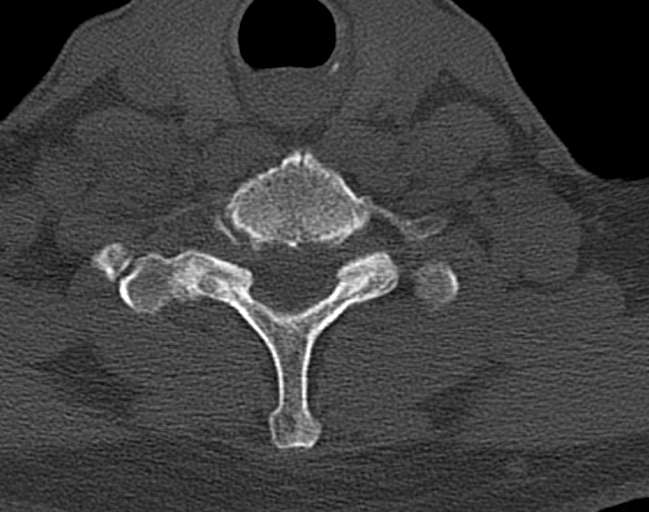
[im 57/133  bone]
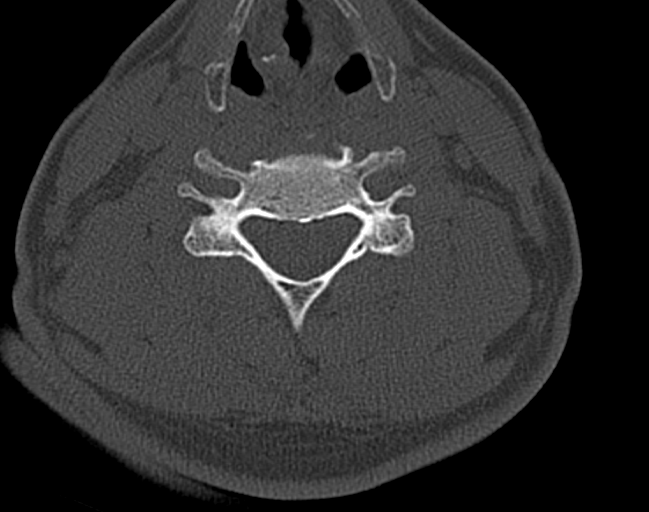
[im 76/133  bone]
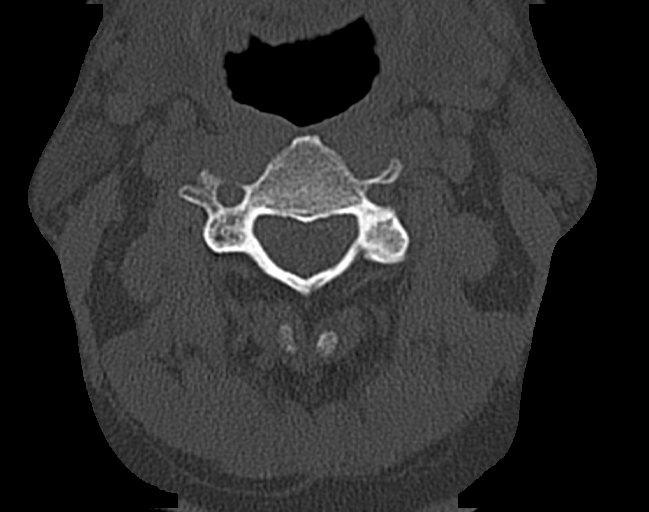
[im 95/133  soft-tissue]
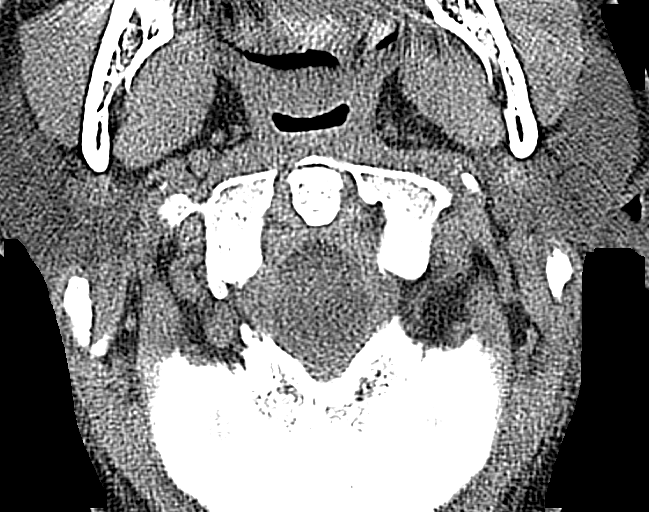
[im 95/133  bone]
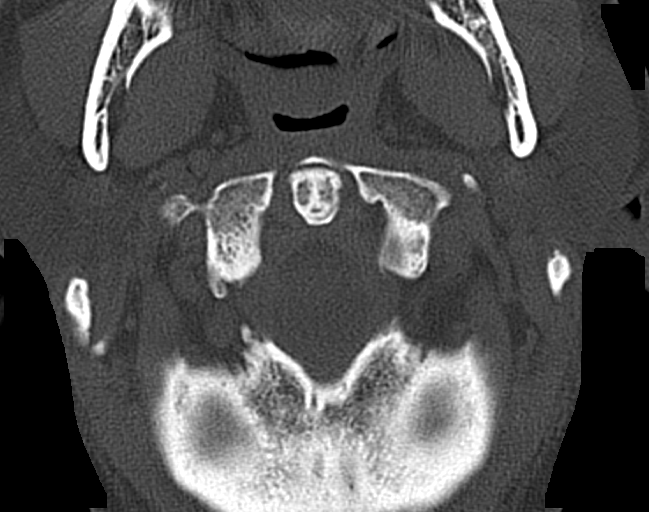
[im 114/133  bone]
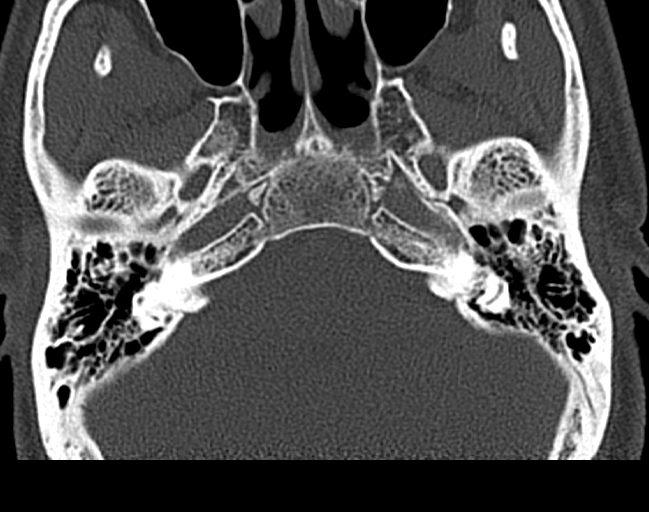

[10 of 14 positions shown; findings below may reference images not displayed]

FINDINGS: Alignment: No acute subluxation.

Skull base and vertebrae: No acute fracture.

Soft tissues and spinal canal: No prevertebral fluid or swelling. No
visible canal hematoma.

Disc levels: Multilevel degenerative changes with endplate
irregularity and disc space narrowing and spurring.

Upper chest: Negative.

Other: None
IMPRESSION: 1. No acute/traumatic cervical spine pathology.
2. Multilevel degenerative changes.

## 2020-10-03 IMAGING — CR DG KNEE COMPLETE 4+V*R*
4 series · 4 of 4 positions shown · non-contrast
Comparison: None.

CLINICAL DATA: Right knee injury.  Right knee pain.

EXAM:
RIGHT KNEE - COMPLETE 4+ VIEW

[knee ap]
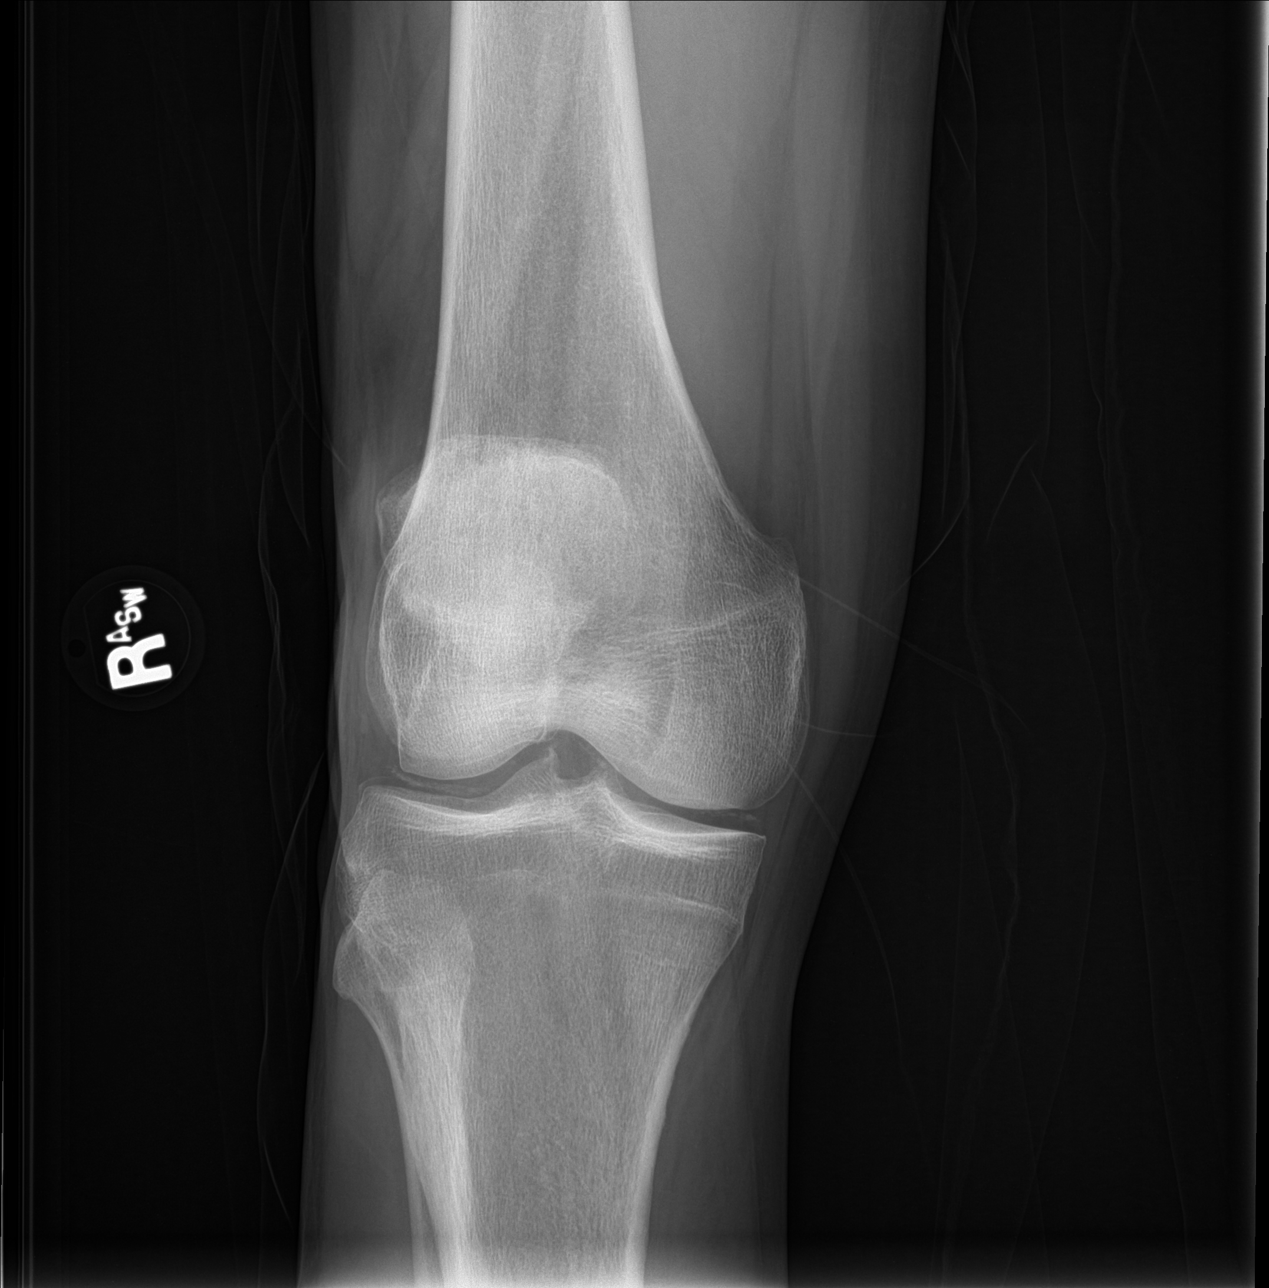

[knee lat]
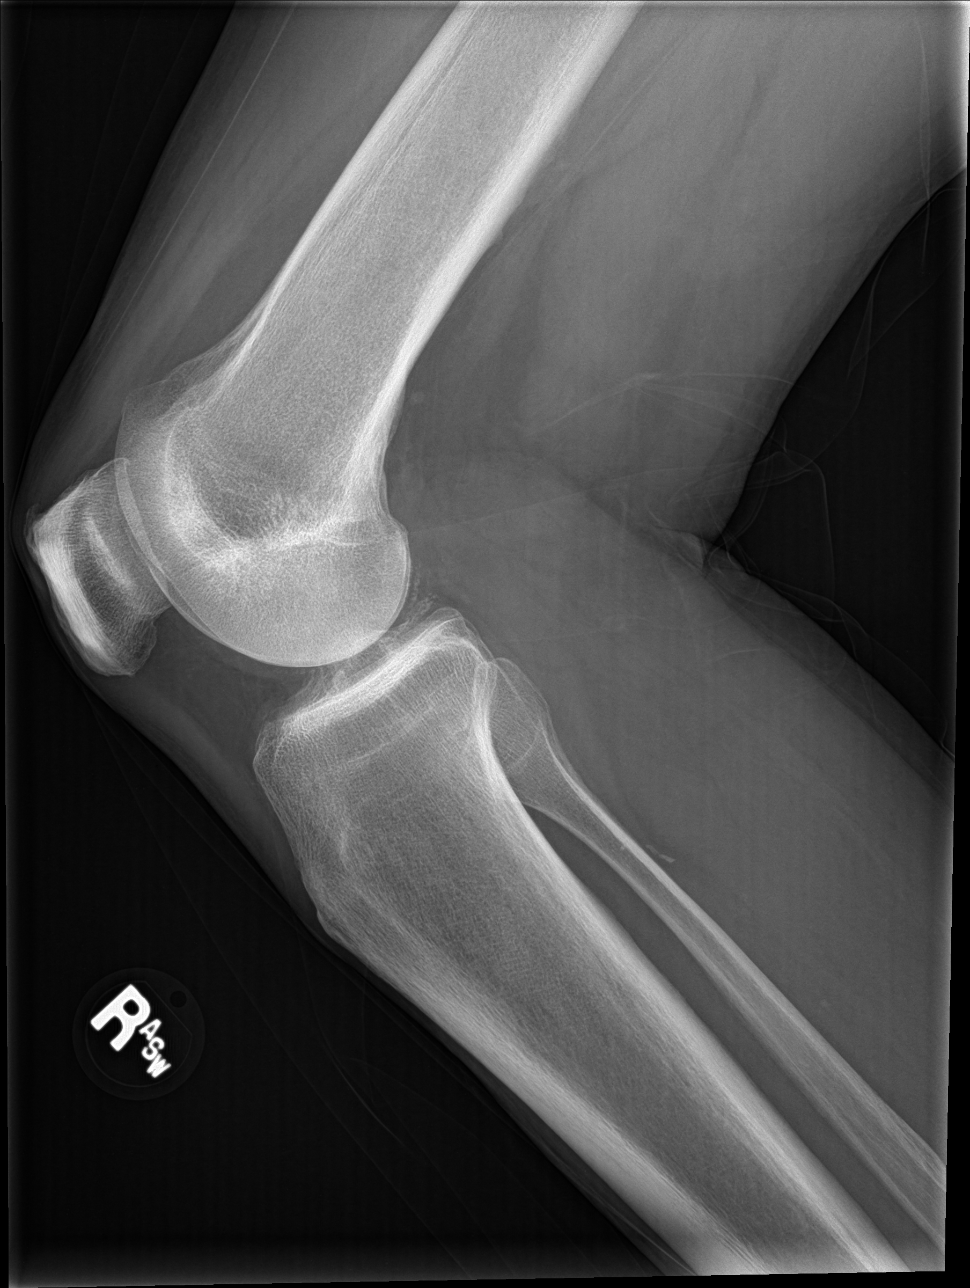

[knee obl (1 of 2)]
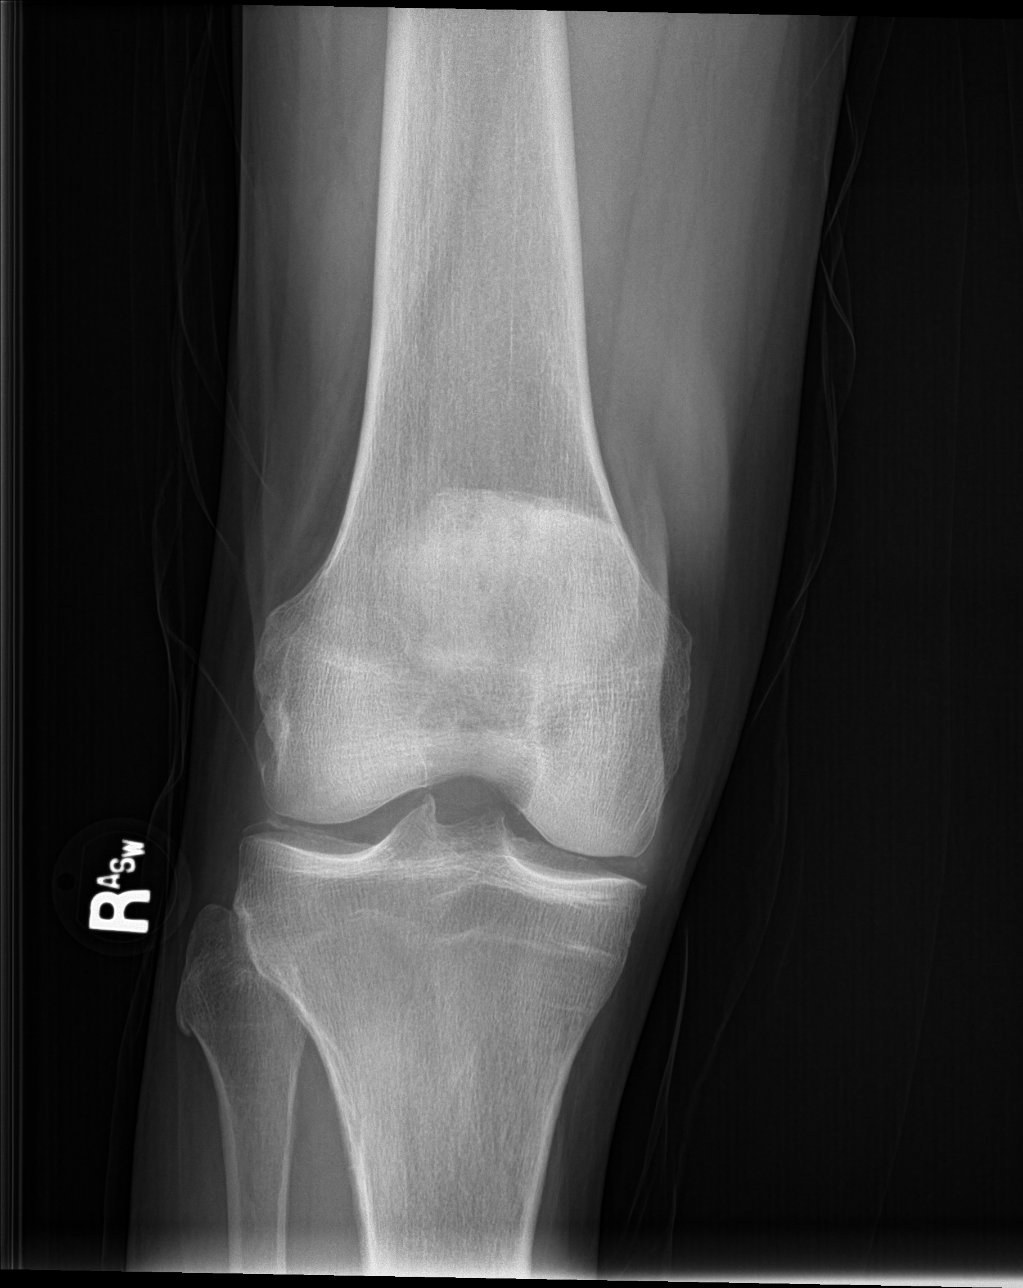

[knee obl (2 of 2)]
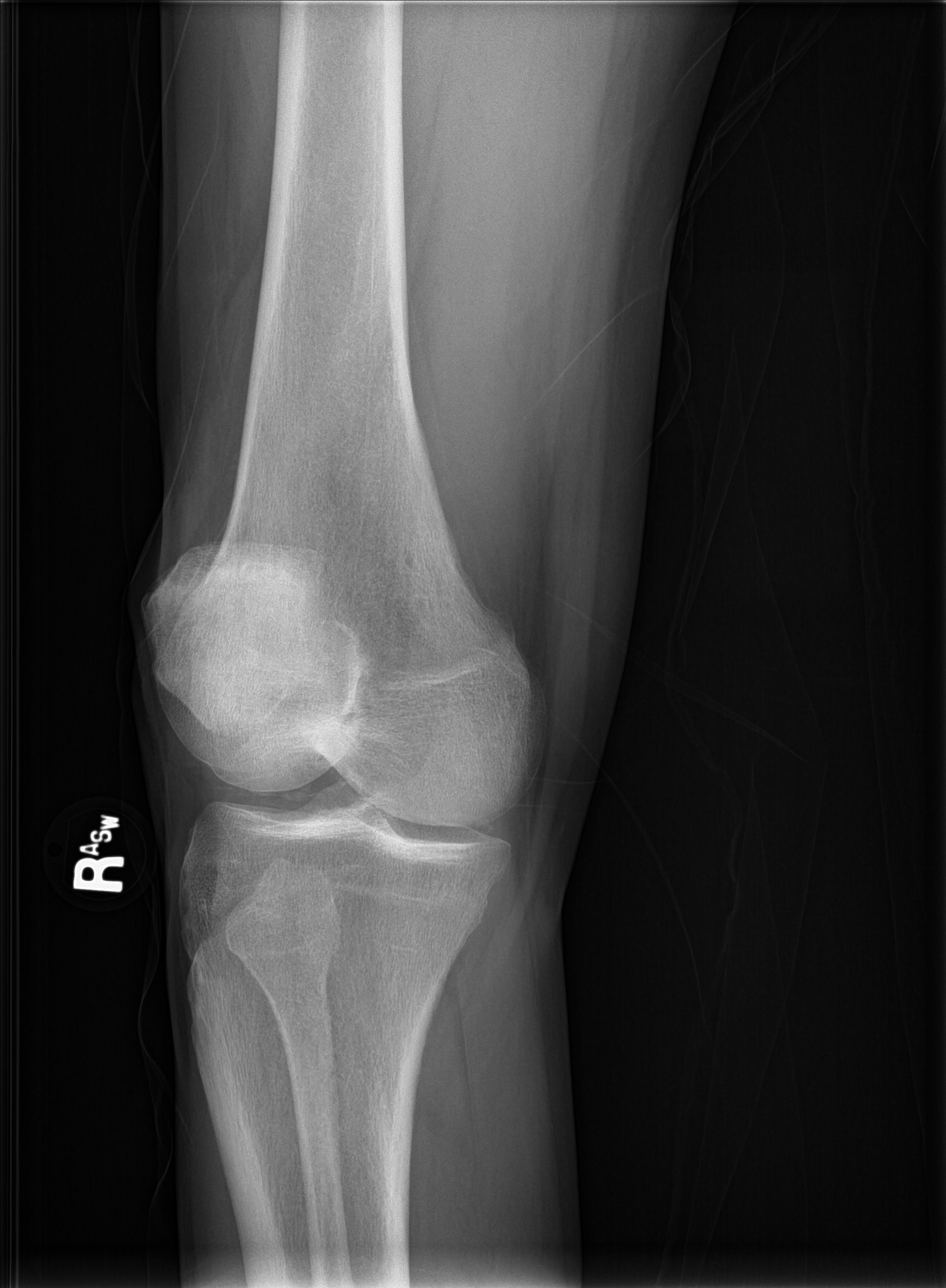

[4 of 4 positions shown; findings below may reference images not displayed]

FINDINGS: No evidence of fracture, dislocation, or joint effusion. Trace
patellofemoral spurring. The joint spaces are preserved. Small
quadriceps tendon enthesophyte. Medial and lateral
chondrocalcinosis. Soft tissues are unremarkable.
IMPRESSION: 1. No acute fracture or subluxation.
2. Chondrocalcinosis.
3. Trace patellofemoral spurring.

## 2020-10-03 IMAGING — CR DG CERVICAL SPINE COMPLETE 4+V
6 series · 6 of 6 positions shown · non-contrast
Comparison: CT [DATE]

CLINICAL DATA: Fall

EXAM:
CERVICAL SPINE - COMPLETE 4+ VIEW

[c-spine lat]
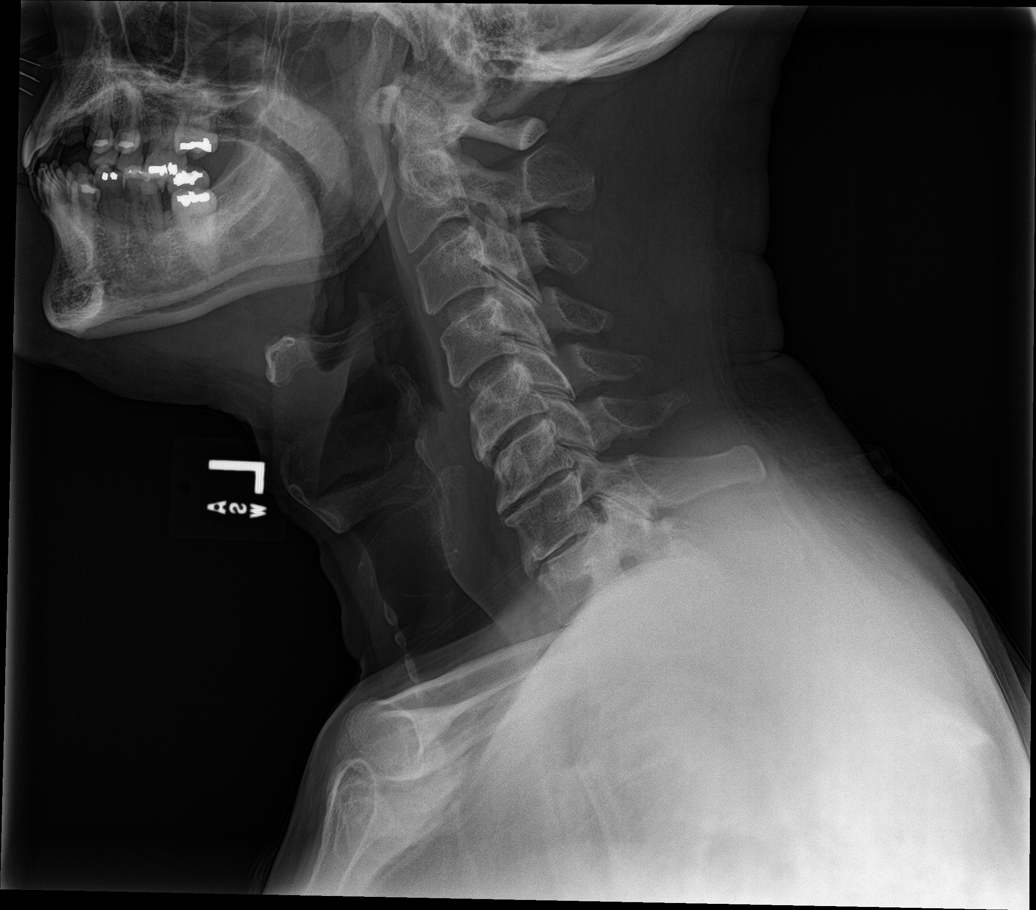

[c-spine obl (1 of 2)]
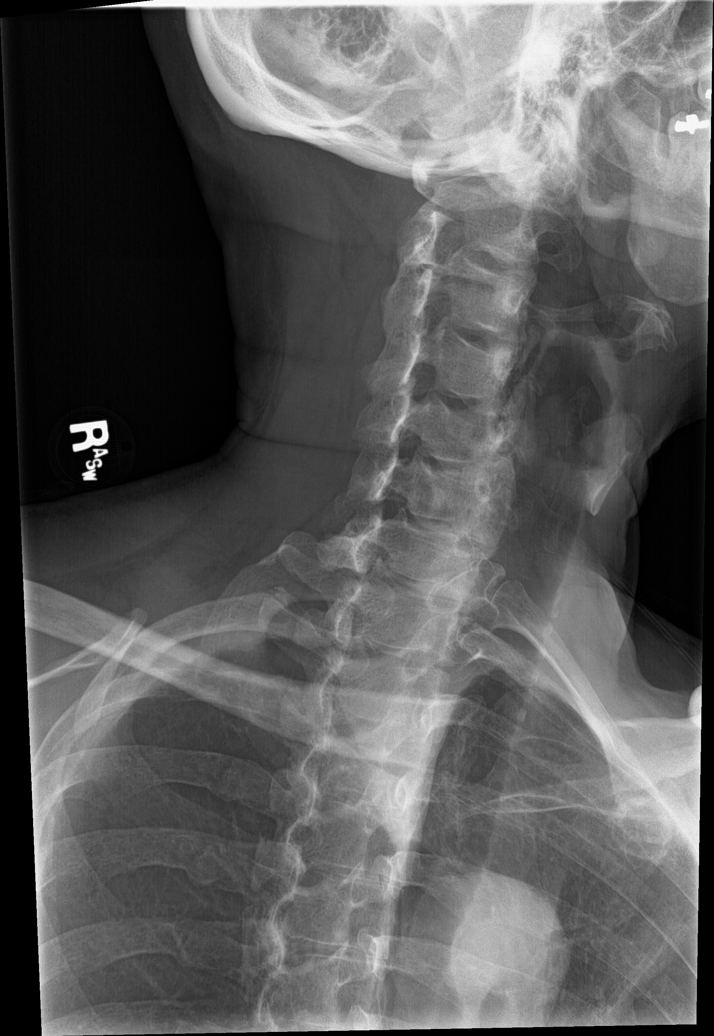

[c-spine obl (2 of 2)]
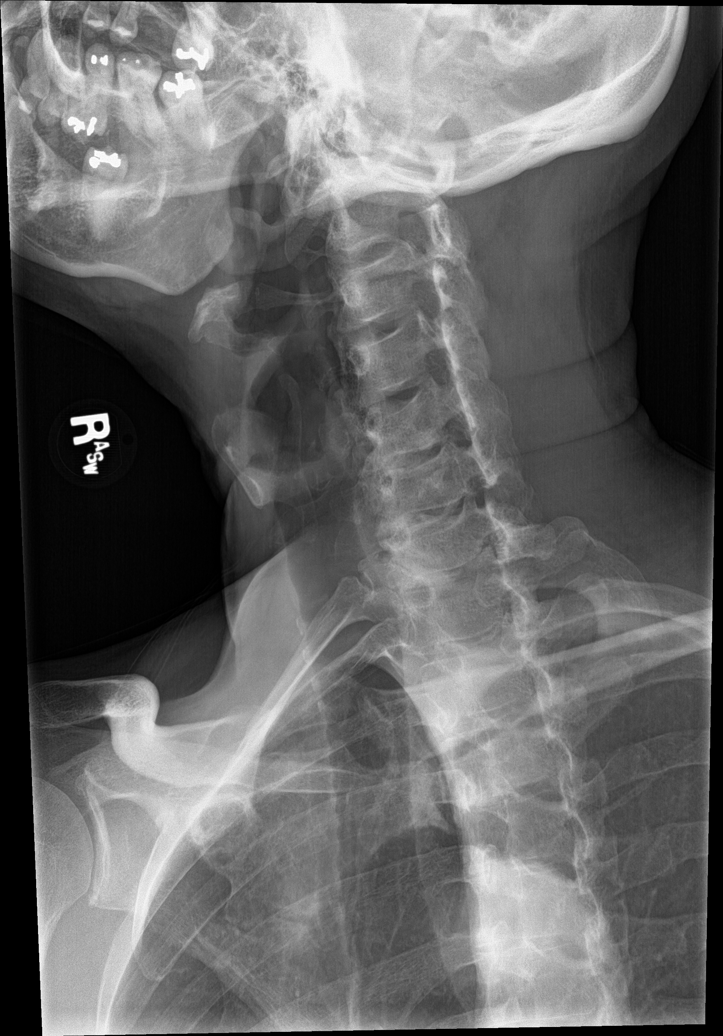

[c-spine ap]
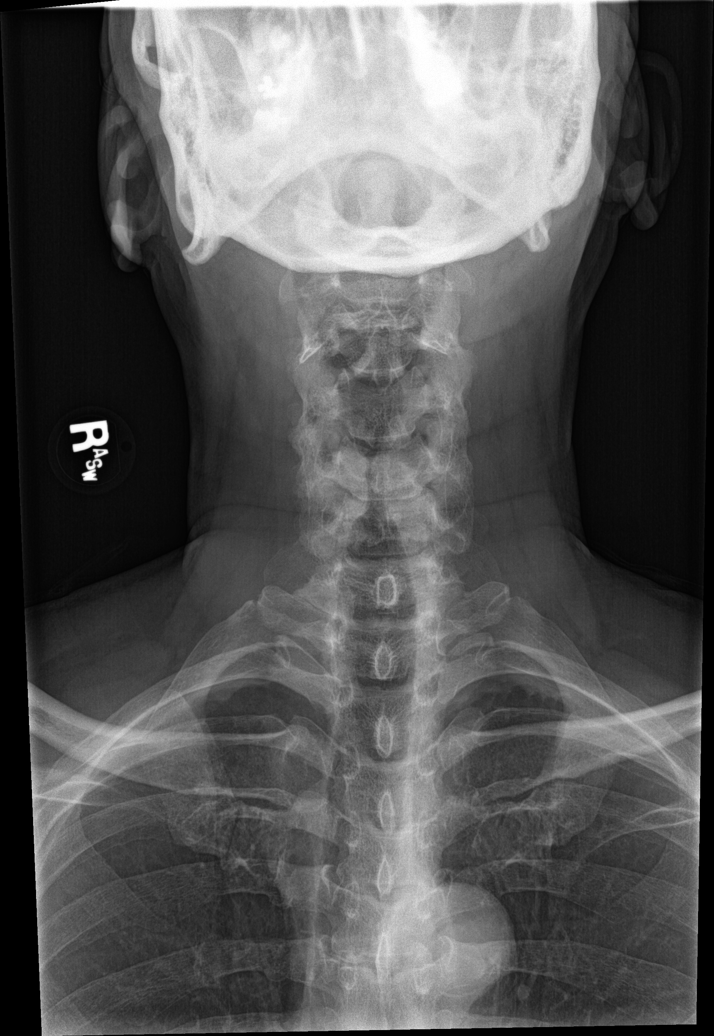

[c-spine open mouth]
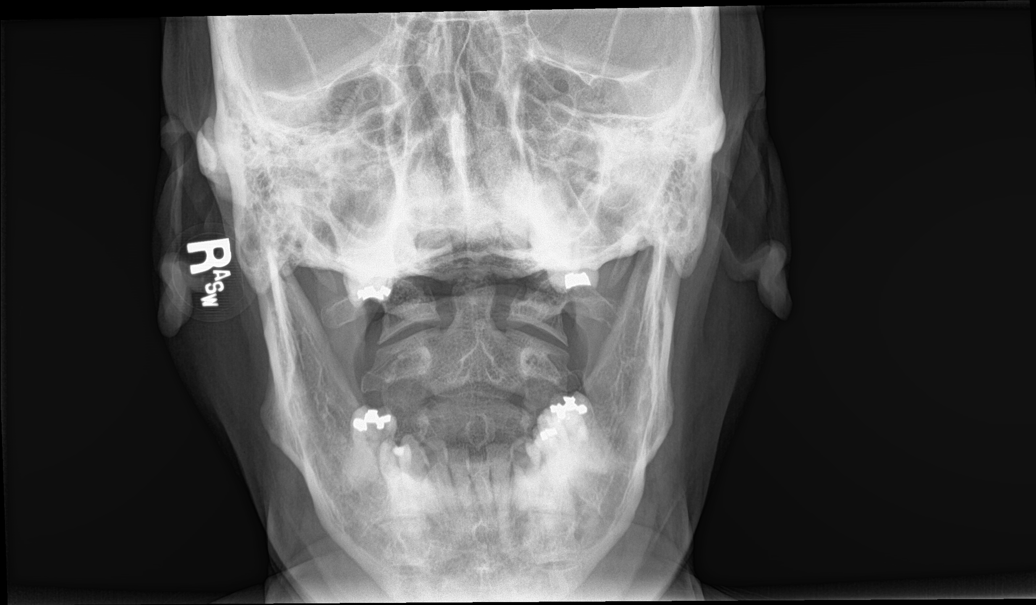

[ct-spine swimmers]
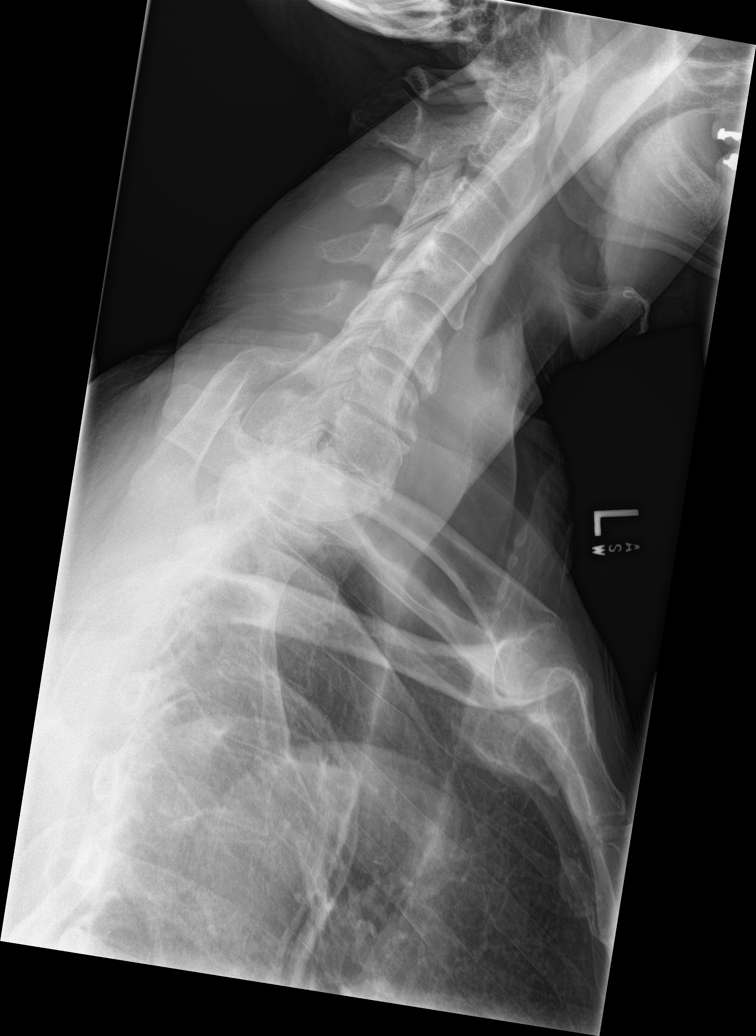

[6 of 6 positions shown; findings below may reference images not displayed]

FINDINGS: Straightening of the cervical spine. Vertebral body heights are
maintained. Moderate degenerative changes C5-C6, C6-C7 and C7-T1.
Dens and lateral masses are within normal limits
IMPRESSION: Straightening of the cervical spine with degenerative changes. No
acute osseous abnormality

## 2020-10-03 MED ORDER — HYDROCODONE-ACETAMINOPHEN 5-325 MG PO TABS: 1.0000 | ORAL_TABLET | Freq: Four times a day (QID) | ORAL | 0 refills | Status: DC | PRN

## 2020-10-03 MED ORDER — TIZANIDINE HCL 4 MG PO TABS: 4.0000 mg | ORAL_TABLET | Freq: Three times a day (TID) | ORAL | 0 refills | Status: DC | PRN

## 2020-10-03 NOTE — ED Triage Notes (Signed)
Pt c/o injury to his neck and right knee today, he fell off of a riding lawn mower. Pt states the mower did not roll over him. Pt states his neck "keeps popping" and a sharp pain in his knee.

## 2020-10-03 NOTE — Discharge Instructions (Signed)
Continue Celebrex.  Try Zanaflex instead of Flexeril or baclofen.  Take a Tylenol containing product 3 or 4 times a day.  Either 1000 mg of Tylenol for mild to moderate pain or 1-2 Norco for severe pain.  Follow-up with your doctor as scheduled.  I suspect that you have injured the meniscus of your knee.  Ice, Celebrex, Tylenol, rest.  Follow-up with orthopedics if not getting any better in about a week

## 2020-10-03 NOTE — ED Provider Notes (Signed)
HPI  SUBJECTIVE:  Earl Murray is a 65 y.o. male who presents with neck and right knee pain after falling off of a riding lawnmower 3 days ago.  States that the lawnmower tipped over, and he fell 4-5 feet down the hill.  States that the lawnmower did not land on him.  He is not sure if he landed on his head or neck.  He denies loss of consciousness.  He now has constant, sharp, midline neck pain and a headache.  He states that this is similar quality to previous neck pain but is more intense than usual.  He reports popping in his neck, but this is not new.  No new or different numbness or tingling in his arm or legs, extremity weakness.  No nausea, vomiting, visual changes, chest pain or shortness of breath.  He has been taking Flexeril, Aleve, ice, heat, and baclofen.  Ice and heat help.  Symptoms are worse with turning his head and lying down.  He also reports anterior constant sharp right knee pain.  He reports swelling, which has resolved.  No distal numbness or tingling, but he states that it is giving way secondary to weakness and pain.  He has been ambulatory on it since the accident.  He has tried Aleve, ice, heat without improvement in his symptoms.  Symptoms worse with walking.  Patient has a past medical history of chronic back pain status post multiple surgeries, hypertension.  No history of diabetes, chronic kidney disease, osteoporosis.  No previous history of neck injury although he has been told that he has had "bone spurs in his neck".  He has an appointment with his PMD at the [place] on 5/19.  Patient was seen in the ED on 4/28 for back and neck pain after falling off his lawnmower.  CT scans were negative.   Past Medical History:  Diagnosis Date  . Hypertension   . Neck injury     Past Surgical History:  Procedure Laterality Date  . BACK SURGERY    . CHOLECYSTECTOMY    . ELBOW SURGERY    . ERCP N/A 10/27/2019   Procedure: ENDOSCOPIC RETROGRADE CHOLANGIOPANCREATOGRAPHY (ERCP);   Surgeon: Lucilla Lame, MD;  Location: [facility] ENDOSCOPY;  Service: Endoscopy;  Laterality: N/A;  . FOOT SURGERY    . HERNIA REPAIR     4 hernia repairs    Family History  Problem Relation Age of Onset  . Hypertension Mother   . Heart disease Mother   . Colon cancer Father     Social History   Tobacco Use  . Smoking status: Former Smoker    Types: Cigarettes    Quit date: 05/2018    Years since quitting: 2.3  . Smokeless tobacco: Former Network engineer  . Vaping Use: Never used  Substance Use Topics  . Alcohol use: Not Currently  . Drug use: Not Currently    No current facility-administered medications for this encounter.  Current Outpatient Medications:  .  amLODipine (NORVASC) 10 MG tablet, Take 10 mg by mouth daily. , Disp: , Rfl:  .  celecoxib (CELEBREX) 200 MG capsule, Take 200 mg by mouth daily., Disp: , Rfl:  .  hydrochlorothiazide (HYDRODIURIL) 25 MG tablet, Take 1 tablet (25 mg total) by mouth daily., Disp: 90 tablet, Rfl: 1 .  HYDROcodone-acetaminophen (NORCO/VICODIN) 5-325 MG tablet, Take 1-2 tablets by mouth every 6 (six) hours as needed for moderate pain or severe pain., Disp: 12 tablet, Rfl: 0 .  tiZANidine (ZANAFLEX)  4 MG tablet, Take 1 tablet (4 mg total) by mouth every 8 (eight) hours as needed for muscle spasms., Disp: 30 tablet, Rfl: 0  Allergies  Allergen Reactions  . Duloxetine Other (See Comments)  . Lisinopril Cough  . Tylenol With Codeine #3 [Acetaminophen-Codeine] Itching  . Motrin [Ibuprofen] Rash  . Tramadol Rash     ROS  As noted in HPI.   Physical Exam  BP (!) 163/94 (BP Location: Left Arm)   Pulse 94   Temp 99 F (37.2 C) (Oral)   Resp 18   Ht 6\' 5"  (1.956 m)   Wt 90.7 kg   SpO2 99%   BMI 23.72 kg/m   Constitutional: Well developed, well nourished, no acute distress Eyes:  EOMI, conjunctiva normal bilaterally HENT: Normocephalic, atraumatic,mucus membranes moist Neck: Diffuse C-spine tenderness.  Bilateral trapezial  tenderness, muscle spasm.  Patient able to flex/extend his neck, rotate his head 45 degrees to the left and right. Respiratory: Normal inspiratory effort Cardiovascular: Normal rate GI: nondistended skin: No rash, skin intact Musculoskeletal: no deformities R Knee ROM baseline for Pt, Flexion  intact, Patella tender, Patellar tendon tender, Medial joint NT, Lateral joint NT, Popliteal region NT, Varus MCL stress testing stable, Valgus LCL stress testing stable, McMurray's testing  abnormal, Lachman's negative. Distal NVI with intact baseline sensation / motor / pulse distal to knee.  No effusion. No erythema. No increased temperature. No crepitus.  Neurologic: Alert & oriented x 3, sensation upper lower extremities intact and equal bilaterally.  Strength upper lower extremities 5/5 and equal bilaterally.  BR/patellar reflexes 1/1 and equal bilaterally. Psychiatric: Speech and behavior appropriate   ED Course   Medications - No data to display  Orders Placed This Encounter  Procedures  . DG Cervical Spine Complete    Standing Status:   Standing    Number of Occurrences:   1    Order Specific Question:   Reason for Exam (SYMPTOM  OR DIAGNOSIS REQUIRED)    Answer:   fall 10/03/20  . DG Knee Complete 4 Views Right    Standing Status:   Standing    Number of Occurrences:   1    Order Specific Question:   Reason for Exam (SYMPTOM  OR DIAGNOSIS REQUIRED)    Answer:   fall 10/03/20  . CT Cervical Spine Wo Contrast    Standing Status:   Standing    Number of Occurrences:   1    No results found for this or any previous visit (from the past 24 hour(s)). DG Cervical Spine Complete  Result Date: 10/03/2020 CLINICAL DATA:  Fall EXAM: CERVICAL SPINE - COMPLETE 4+ VIEW COMPARISON:  CT 10/23/2017 FINDINGS: Straightening of the cervical spine. Vertebral body heights are maintained. Moderate degenerative changes C5-C6, C6-C7 and C7-T1. Dens and lateral masses are within normal limits IMPRESSION:  Straightening of the cervical spine with degenerative changes. No acute osseous abnormality Electronically Signed   By: Donavan Foil M.D.   On: 10/03/2020 20:02   CT Cervical Spine Wo Contrast  Result Date: 10/03/2020 CLINICAL DATA:  65 year old male with neck trauma. EXAM: CT CERVICAL SPINE WITHOUT CONTRAST TECHNIQUE: Multidetector CT imaging of the cervical spine was performed without intravenous contrast. Multiplanar CT image reconstructions were also generated. COMPARISON:  Cervical spine CT dated 09/19/2020. FINDINGS: Alignment: No acute subluxation. Skull base and vertebrae: No acute fracture. Soft tissues and spinal canal: No prevertebral fluid or swelling. No visible canal hematoma. Disc levels: Multilevel degenerative changes with endplate irregularity and  disc space narrowing and spurring. Upper chest: Negative. Other: None IMPRESSION: 1. No acute/traumatic cervical spine pathology. 2. Multilevel degenerative changes. Electronically Signed   By: Anner Crete M.D.   On: 10/03/2020 21:25   DG Knee Complete 4 Views Right  Result Date: 10/03/2020 CLINICAL DATA:  Right knee injury.  Right knee pain. EXAM: RIGHT KNEE - COMPLETE 4+ VIEW COMPARISON:  None. FINDINGS: No evidence of fracture, dislocation, or joint effusion. Trace patellofemoral spurring. The joint spaces are preserved. Small quadriceps tendon enthesophyte. Medial and lateral chondrocalcinosis. Soft tissues are unremarkable. IMPRESSION: 1. No acute fracture or subluxation. 2. Chondrocalcinosis. 3. Trace patellofemoral spurring. Electronically Signed   By: Keith Rake M.D.   On: 10/03/2020 20:03    ED Clinical Impression  1. Neck pain   2. Injury of right knee, initial encounter      ED Assessment/Plan  Previous records reviewed.  As noted in HPI.  Reviewed imaging independently.  C-spine: Straightening the cervical spine with degenerative changes.  No acute osseous abnormality.  Knee: Trace patellofemoral spurring.  No  fracture or subluxation.  See radiology report for full details.  While there are no acute abnormalities on C-spine films, will obtain CT of the C-spine as it is the gold standard to absolutely rule out fracture.  Discussed this with patient.  He agrees with this  [employer] narcotic database reviewed.  No opiate prescriptions on record in the past 2 years although I see a recent Percocet prescription from the ED on 09/19/2020  1.  Neck pain: No fracture on CT.  He has bilateral trapezial spasms.  He is to continue Celebrex, we can try Zanaflex instead of Flexeril or baclofen.  He can add Tylenol to his pain regimen.  Will send home with a short course of Norco.  He has follow-up with his PMD on 5/18.  Of note, he saw neurosurgery today.  2.  Knee pain: Suspect meniscal injury.  There is no acute abnormalities on x-rays.   will have him follow-up with orthopedics if not better with NSAIDs, ice.   Discussed , imaging, MDM, treatment plan, and plan for follow-up with patient. Discussed sn/sx that should prompt return to the ED. patient agrees with plan.   Meds ordered this encounter  Medications  . tiZANidine (ZANAFLEX) 4 MG tablet    Sig: Take 1 tablet (4 mg total) by mouth every 8 (eight) hours as needed for muscle spasms.    Dispense:  30 tablet    Refill:  0  . HYDROcodone-acetaminophen (NORCO/VICODIN) 5-325 MG tablet    Sig: Take 1-2 tablets by mouth every 6 (six) hours as needed for moderate pain or severe pain.    Dispense:  12 tablet    Refill:  0      *This clinic note was created using [employer]. Therefore, there may be occasional mistakes despite careful proofreading.  ?    Melynda Ripple, MD 10/04/20 [phone]

## 2020-10-04 ENCOUNTER — Other Ambulatory Visit: Payer: Self-pay | Admitting: Neurosurgery

## 2020-10-04 DIAGNOSIS — M438X9 Other specified deforming dorsopathies, site unspecified: Secondary | ICD-10-CM

## 2020-10-04 DIAGNOSIS — M419 Scoliosis, unspecified: Secondary | ICD-10-CM

## 2020-10-08 ENCOUNTER — Ambulatory Visit
Admission: RE | Admit: 2020-10-08 | Discharge: 2020-10-08 | Disposition: A | Discharge status: Home / Self Care | Payer: Medicare (Managed Care) | Source: Home / Self Care | Attending: Neurosurgery | Admitting: Neurosurgery

## 2020-10-08 ENCOUNTER — Emergency Department: Payer: Medicare (Managed Care)

## 2020-10-08 ENCOUNTER — Other Ambulatory Visit: Payer: Self-pay | Admitting: Neurosurgery

## 2020-10-08 ENCOUNTER — Emergency Department
Admission: EM | Admit: 2020-10-08 | Discharge: 2020-10-09 | Disposition: A | Discharge status: Home / Self Care | Payer: Medicare (Managed Care) | Attending: Emergency Medicine | Admitting: Emergency Medicine

## 2020-10-08 ENCOUNTER — Other Ambulatory Visit: Payer: Self-pay

## 2020-10-08 ENCOUNTER — Encounter: Payer: Self-pay | Admitting: *Deleted

## 2020-10-08 ENCOUNTER — Ambulatory Visit
Admission: RE | Admit: 2020-10-08 | Discharge: 2020-10-08 | Disposition: A | Discharge status: Home / Self Care | Payer: Medicare (Managed Care) | Source: Ambulatory Visit | Attending: Neurosurgery | Admitting: Neurosurgery

## 2020-10-08 DIAGNOSIS — M545 Low back pain, unspecified: Secondary | ICD-10-CM | POA: Insufficient documentation

## 2020-10-08 DIAGNOSIS — G8929 Other chronic pain: Secondary | ICD-10-CM

## 2020-10-08 DIAGNOSIS — I10 Essential (primary) hypertension: Secondary | ICD-10-CM | POA: Insufficient documentation

## 2020-10-08 DIAGNOSIS — Z87891 Personal history of nicotine dependence: Secondary | ICD-10-CM | POA: Insufficient documentation

## 2020-10-08 DIAGNOSIS — R1084 Generalized abdominal pain: Secondary | ICD-10-CM | POA: Diagnosis not present

## 2020-10-08 DIAGNOSIS — Z79899 Other long term (current) drug therapy: Secondary | ICD-10-CM | POA: Insufficient documentation

## 2020-10-08 LAB — URINALYSIS, COMPLETE (UACMP) WITH MICROSCOPIC
Bacteria, UA: NONE SEEN
Bilirubin Urine: NEGATIVE
Glucose, UA: NEGATIVE mg/dL
Ketones, ur: NEGATIVE mg/dL
Leukocytes,Ua: NEGATIVE
Nitrite: NEGATIVE
Protein, ur: NEGATIVE mg/dL
Specific Gravity, Urine: 1.02 (ref 1.005–1.030)
pH: 5 (ref 5.0–8.0)

## 2020-10-08 LAB — COMPREHENSIVE METABOLIC PANEL
ALT: 16 U/L (ref 0–44)
AST: 21 U/L (ref 15–41)
Albumin: 4.4 g/dL (ref 3.5–5.0)
Alkaline Phosphatase: 84 U/L (ref 38–126)
Anion gap: 9 (ref 5–15)
BUN: 24 mg/dL — ABNORMAL HIGH (ref 8–23)
CO2: 24 mmol/L (ref 22–32)
Calcium: 9.7 mg/dL (ref 8.9–10.3)
Chloride: 104 mmol/L (ref 98–111)
Creatinine, Ser: 1.56 mg/dL — ABNORMAL HIGH (ref 0.61–1.24)
GFR, Estimated: 49 mL/min — ABNORMAL LOW (ref >60)
Glucose, Bld: 104 mg/dL — ABNORMAL HIGH (ref 70–99)
Potassium: 3.7 mmol/L (ref 3.5–5.1)
Sodium: 137 mmol/L (ref 135–145)
Total Bilirubin: 0.9 mg/dL (ref 0.3–1.2)
Total Protein: 8 g/dL (ref 6.5–8.1)

## 2020-10-08 LAB — CBC
HCT: 42.3 % (ref 39.0–52.0)
Hemoglobin: 14.5 g/dL (ref 13.0–17.0)
MCH: 31 pg (ref 26.0–34.0)
MCHC: 34.3 g/dL (ref 30.0–36.0)
MCV: 90.6 fL (ref 80.0–100.0)
Platelets: 272 10*3/uL (ref 150–400)
RBC: 4.67 MIL/uL (ref 4.22–5.81)
RDW: 15 % (ref 11.5–15.5)
WBC: 14.3 10*3/uL — ABNORMAL HIGH (ref 4.0–10.5)
nRBC: 0 % (ref 0.0–0.2)

## 2020-10-08 LAB — LIPASE, BLOOD: Lipase: 28 U/L (ref 11–51)

## 2020-10-08 IMAGING — CT CT L SPINE W/O CM
2 of 4 series · 9 of 33 positions shown, 11 images · non-contrast
Comparison: Contemporary scoliosis radiographs, CT abdomen pelvis
[DATE], lumbar MRI [DATE]

CLINICAL DATA: Periumbilical pain, 2 years of similar symptoms,
history of back surgery

EXAM:
CT ABDOMEN AND PELVIS WITHOUT CONTRAST
CT LUMBAR SPINE WITHOUT CONTRAST
TECHNIQUE: Multidetector CT imaging of the abdomen and pelvis was performed
following the standard protocol without IV contrast.
Multiplanar CT images of the lumbar spine were reconstructed from
contemporary CT of the Chest, Abdomen, and Pelvis

[Series 2: axial st · axial · 0.28mm/px · z∈[-471,-237]mm · 6 of 165 slices shown, 8 images]
[im 24/165  soft-tissue]
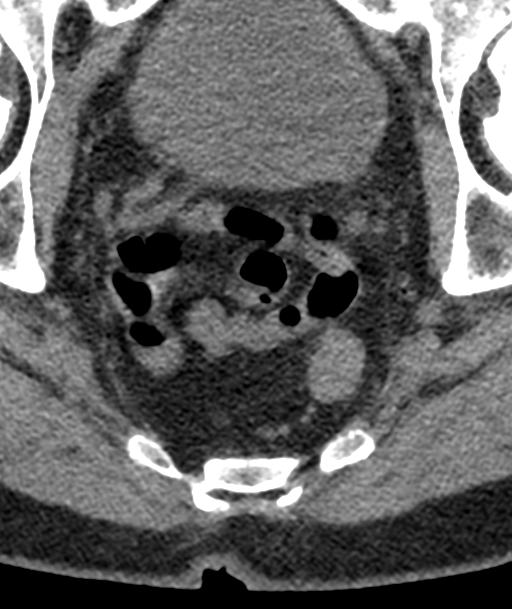
[im 24/165  bone]
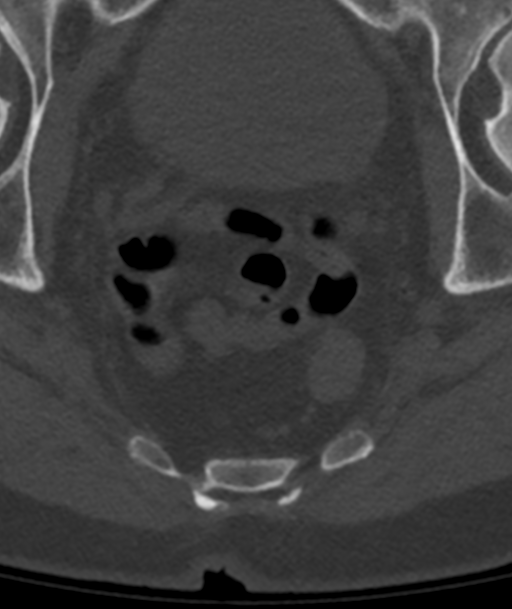
[im 47/165  bone]
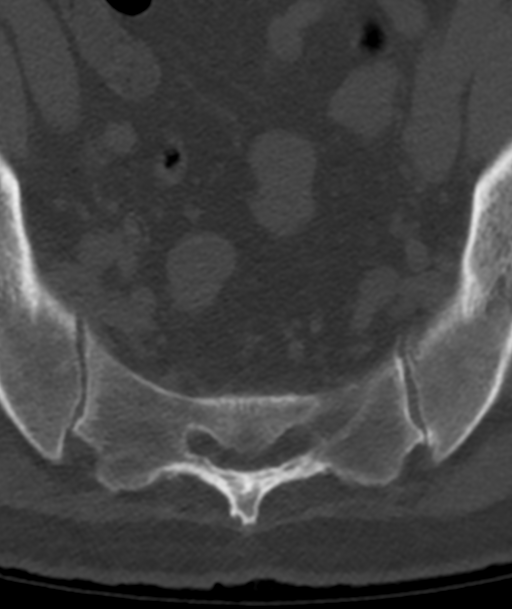
[im 71/165  bone]
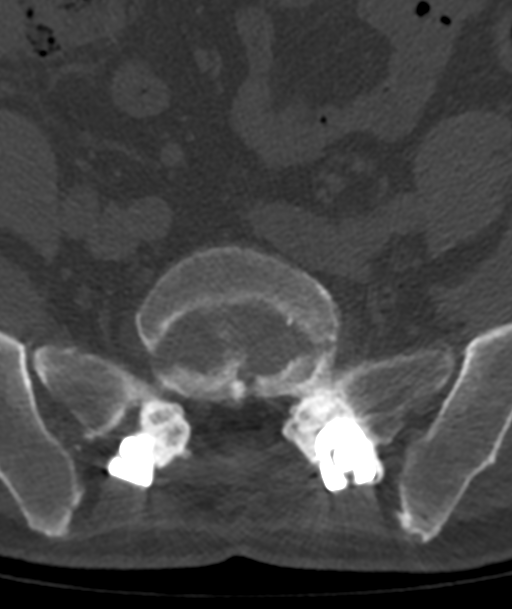
[im 94/165  bone]
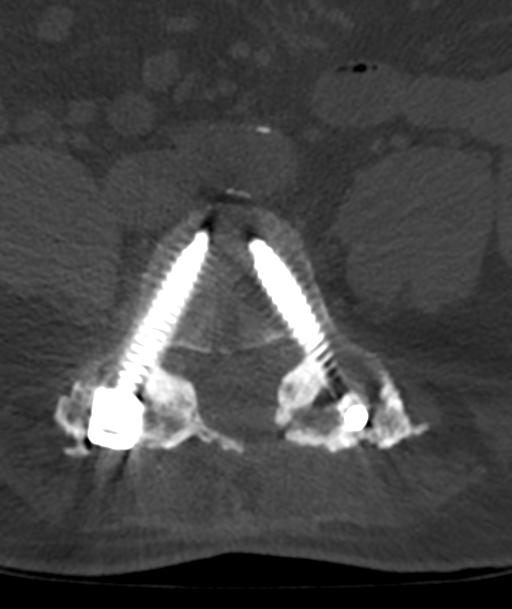
[im 118/165  soft-tissue]
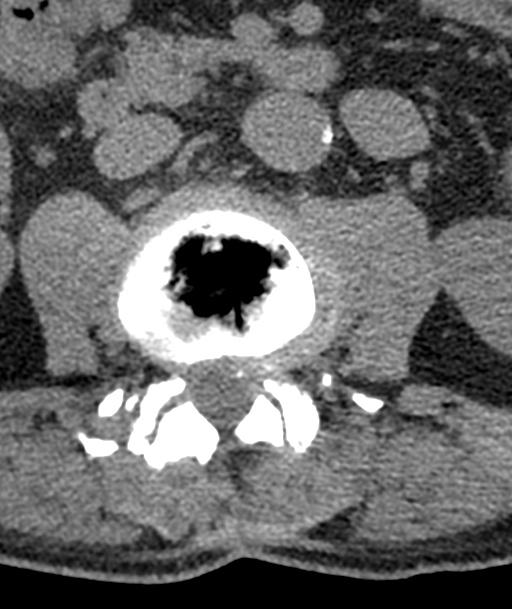
[im 118/165  bone]
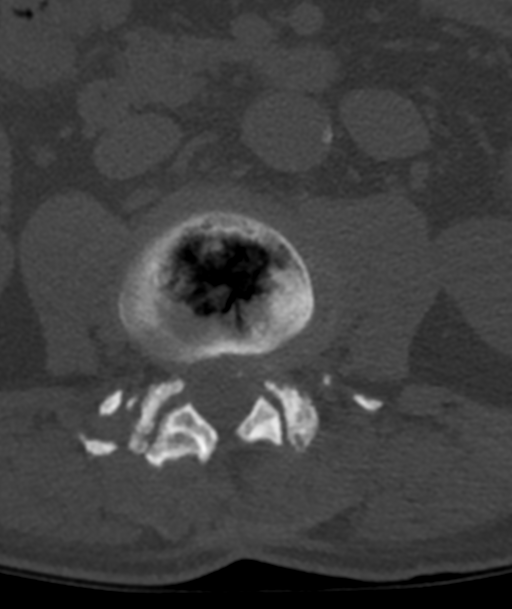
[im 141/165  bone]
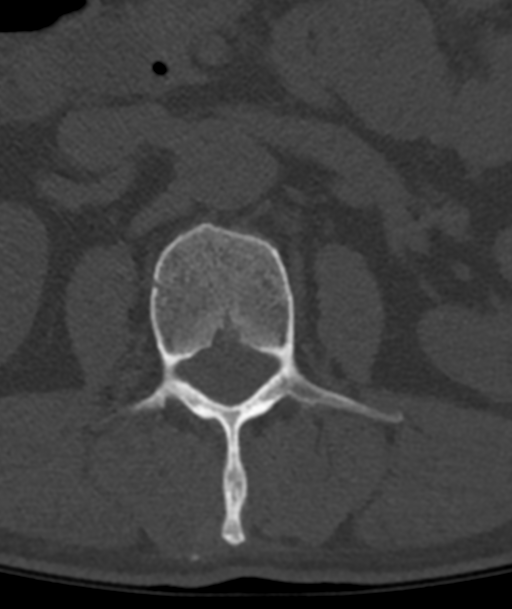

[Series 4: coronal bn · coronal · 0.32mm/px · 3 of 85 slices shown]
[im 17/85  bone]
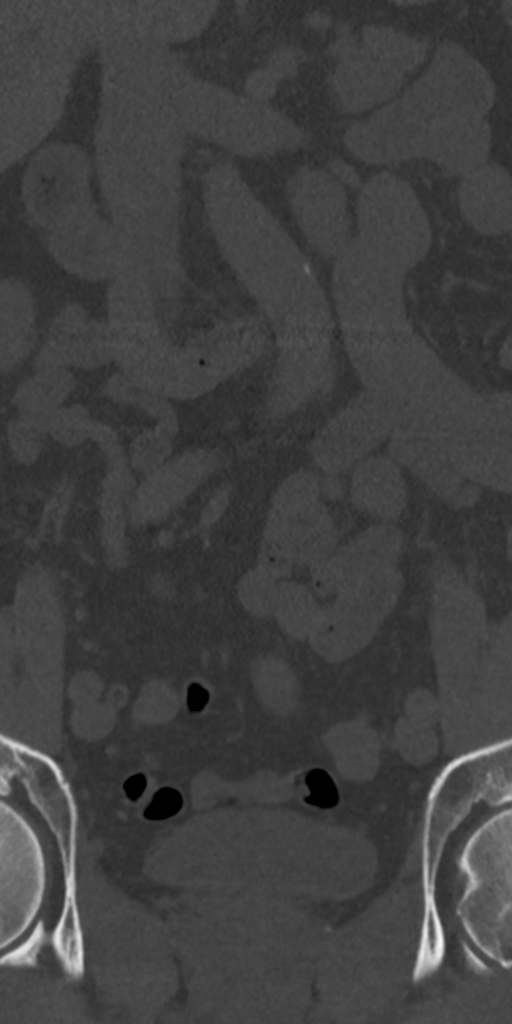
[im 34/85  bone]
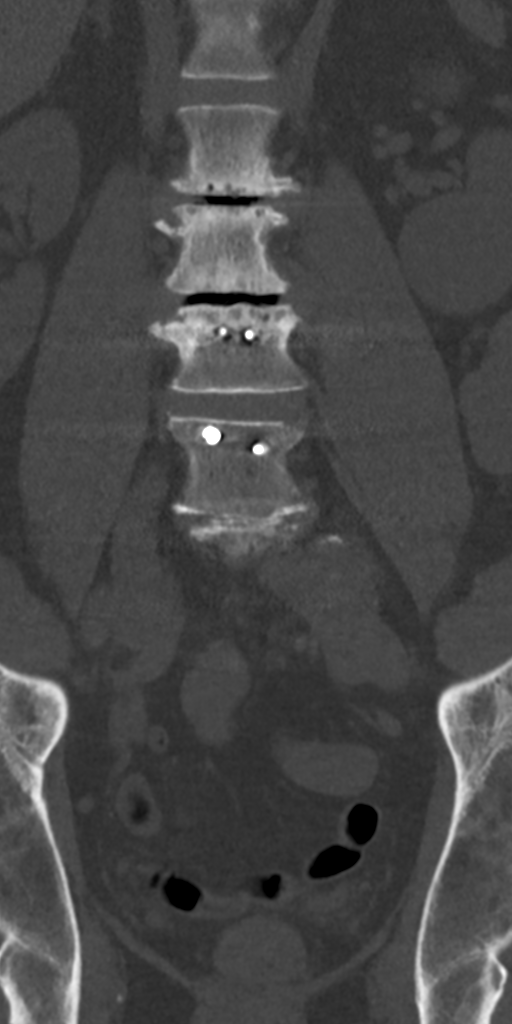
[im 51/85  bone]
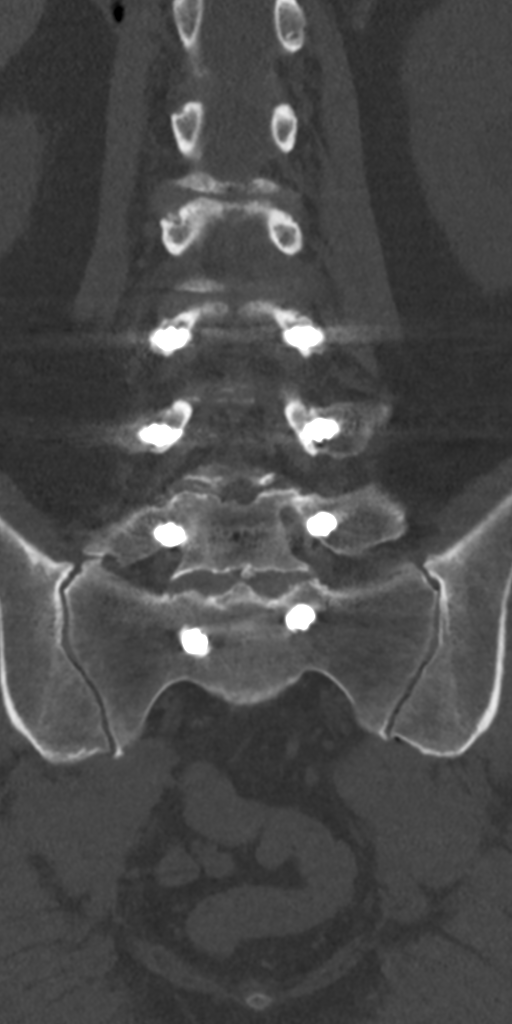

[9 of 33 positions shown; findings below may reference images not displayed]

FINDINGS: Lower chest: Atelectatic changes are present in the otherwise clear
lung bases. Normal heart size. No pericardial effusion.

Hepatobiliary: Smooth liver surface contour. Normal hepatic
attenuation. Focal fatty infiltration at the falciform ligament. No
concerning focal liver lesion within the limitations of an
unenhanced exam. Prior cholecystectomy. Anti dependent pneumobilia
noted within the left lobe liver with a markedly dilated common bile
duct measuring up to 18 mm in diameter though this is an unchanged
finding from comparison CT. No visible calcified gallstones.

Pancreas: No pancreatic ductal dilatation or surrounding
inflammatory changes.

Spleen: Normal in size. No concerning splenic lesions.

Adrenals/Urinary Tract: Normal adrenal glands. Fluid attenuation
cyst in the upper pole left kidney is unchanged from comparison. No
hyperdense focus in the anterior right kidney measuring up to 7 mm
in size is not significantly changed comparison CT in [VI]. Favor
benign hyperdense/proteinaceous cyst no other visible or contour
deforming renal lesions. No hydronephrosis or urolithiasis. Urinary
bladder is unremarkable.

Stomach/Bowel: Distal esophagus, stomach and duodenal sweep are
unremarkable. No small bowel wall thickening or dilatation. No
evidence of obstruction. Retrocecal appendix without periappendiceal
inflammation or other features of acute appendicitis. No colonic
dilatation or wall thickening.

Vascular/Lymphatic: Atherosclerotic calcifications within the
abdominal aorta and branch vessels. No aneurysm or ectasia. No
enlarged abdominopelvic lymph nodes.

Reproductive: The prostate and seminal vesicles are unremarkable.

Other: No abdominopelvic free fluid or free gas. No bowel containing
hernias.

Musculoskeletal: For full findings of the spine, see below. Bones of
pelvis are intact and congruent. Mild degenerative changes of the
hips and pelvis are not significantly advanced from comparison CT
imaging. No suspicious lytic or blastic lesions. Musculature is
normal and symmetric.

CT LUMBAR SPINE FINDINGS

Segmentation: 5 normally formed lumbar type vertebral bodies. Lowest
fully formed disc space denoted as L5-S1 for prior numbering
convention.

Alignment: Mild straightening of the normal lumbar lordosis,
unchanged from comparison. No significant spondylolisthesis or
spondylolysis at the non instrumented levels.

Vertebrae: Posterior decompression with laminectomy L2 as well as
additional decompressive changes and instrumented fusion L3-S1 with
posterior fusion rods and bilateral transpedicular screws and bony
fusion across the posterior facets.

Paraspinal and other soft tissues: No paraspinal fluid, swelling,
gas or hemorrhage. No visible canal hematoma within the limitations
of this unenhanced CT imaging with notable streak artifact from
metallic spinal fixation hardware.

Disc levels:

Level by level evaluation of the lumbar spine below:

T12-L1: No significant spinal canal stenosis or foraminal narrowing.

L1-L2: L2 laminectomy. Disc height loss and global disc bulge. Mild
bilateral facet arthropathy. No significant residual spinal or
foraminal stenosis though some mild lateral recess narrowing is
again noted though better seen on comparison MR.

BETONKO2-L3: L2 laminectomy and decompressive changes L3. Global disc
bulge and bilateral facet arthropathy results in some residual
lateral foraminal stenosis and bilateral foraminal narrowing with
canal stenosis largely mitigated by the posterior decompression.

L3-L4: Wide decompressive laminectomy and posterior fusion. No
residual canal or foraminal narrowing.

L4-L5: Wide decompressive laminectomy and posterior fusion. No
residual canal or foraminal narrowing.

L5-S1: Wide decompressive laminectomy and posterior fusion. No
residual canal or foraminal narrowing.
IMPRESSION: CT abdomen and pelvis:

1. Chronic biliary ductal dilatation and pneumobilia, similar to
comparison exam and likely related to prior instrumentation and post
cholecystectomy reservoir effect. However, if there is clinical
concern for acute biliary pathology, consider serologic evaluation
and if abnormal, MRCP could be obtained.
2. No other acute CT abnormality to provide cause for patient's
symptoms.
3. High attenuation 7 mm focus in the upper pole right kidney.
Compatible with a proteinaceous/hemorrhagic cyst as characterized on
prior MR imaging. Additional simple appearing cysts.

CT lumbar spine:

1. No acute fracture or traumatic malalignment.
2. Prior decompressive changes and L3-S1 posterior lumbar fusion as
detailed above. No acute hardware complication.
3. Some mild residual lateral recess stenosis and foraminal
narrowing noted at L1-2, L2-3.

## 2020-10-08 IMAGING — CT CT ABD-PELV W/O CM
2 of 4 series · 13 of 46 positions shown, 15 images · non-contrast
Comparison: Contemporary scoliosis radiographs, CT abdomen pelvis
[DATE], lumbar MRI [DATE]

CLINICAL DATA: Periumbilical pain, 2 years of similar symptoms,
history of back surgery

EXAM:
CT ABDOMEN AND PELVIS WITHOUT CONTRAST
CT LUMBAR SPINE WITHOUT CONTRAST
TECHNIQUE: Multidetector CT imaging of the abdomen and pelvis was performed
following the standard protocol without IV contrast.
Multiplanar CT images of the lumbar spine were reconstructed from
contemporary CT of the Chest, Abdomen, and Pelvis

[Series 2: routine abd/(person_name) (person_name) · axial · 0.72mm/px · z∈[-541,-146]mm · 10 of 97 slices shown, 12 images]
[im 9/97  soft-tissue]
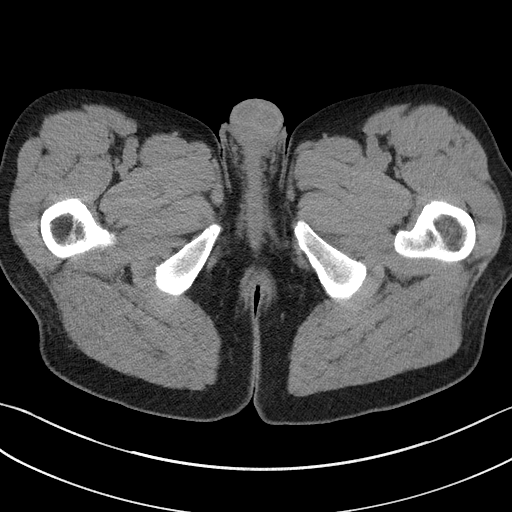
[im 9/97  bone]
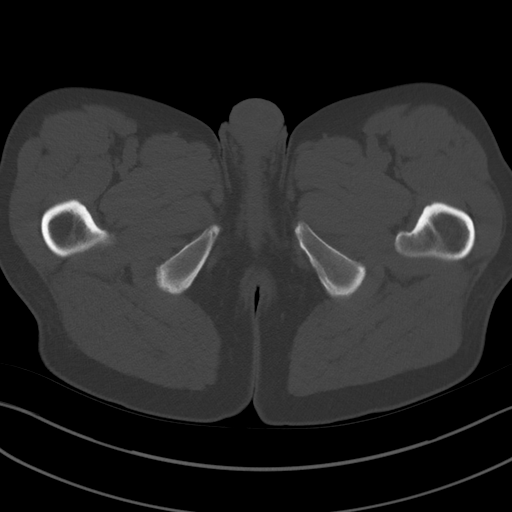
[im 17/97  soft-tissue]
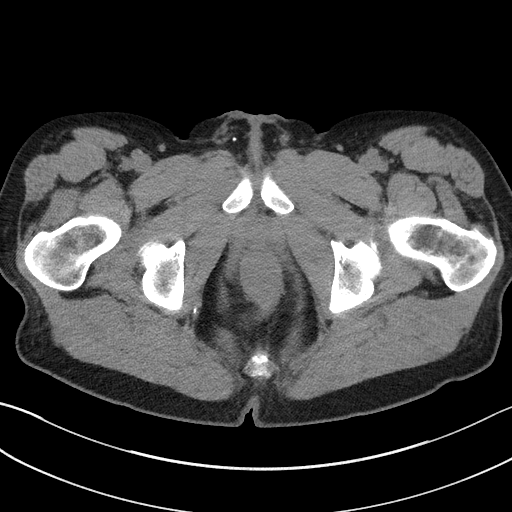
[im 26/97  soft-tissue]
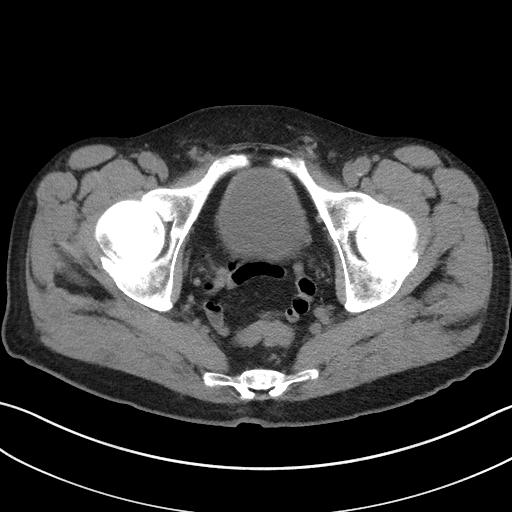
[im 34/97  soft-tissue]
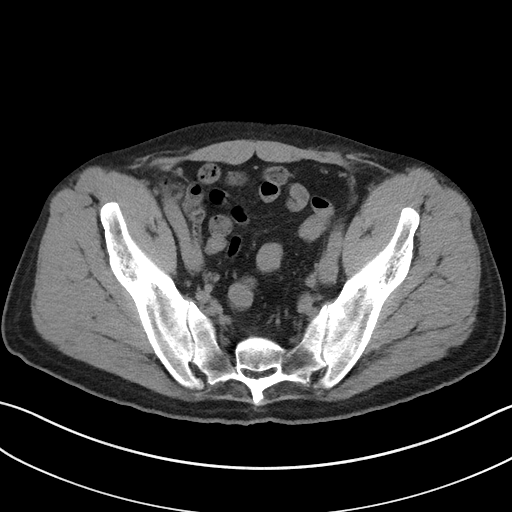
[im 42/97  soft-tissue]
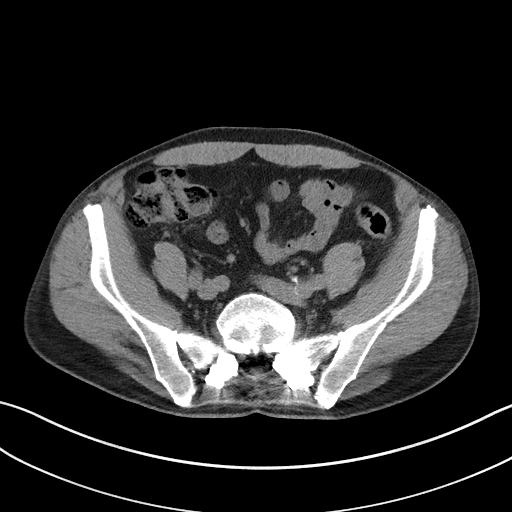
[im 55/97  soft-tissue]
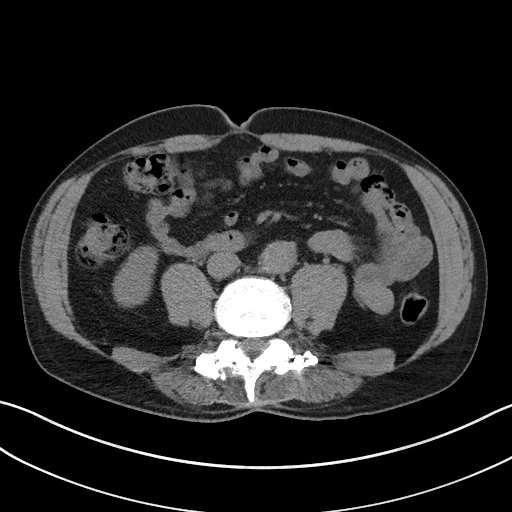
[im 63/97  soft-tissue]
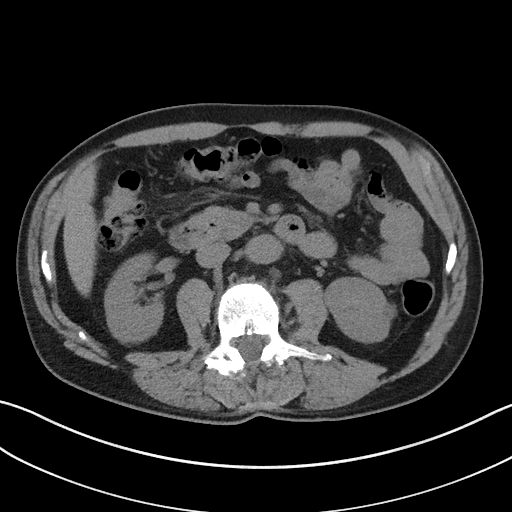
[im 71/97  soft-tissue]
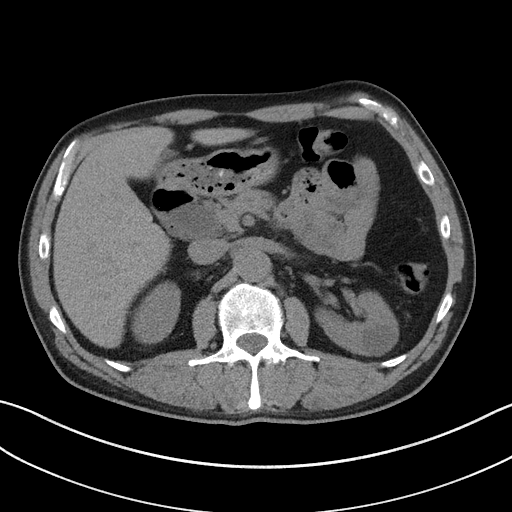
[im 80/97  soft-tissue]
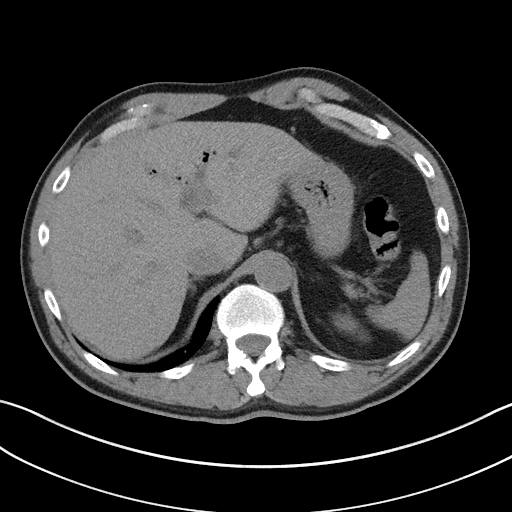
[im 80/97  bone]
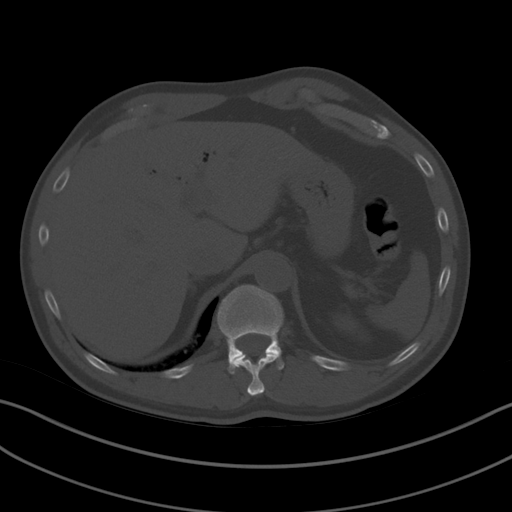
[im 88/97  soft-tissue]
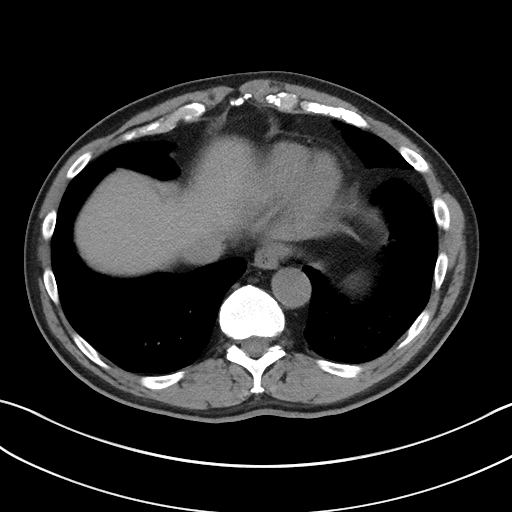

[Series 5: coronal st · coronal · 0.74mm/px · 3 of 90 slices shown]
[im 30/90  soft-tissue]
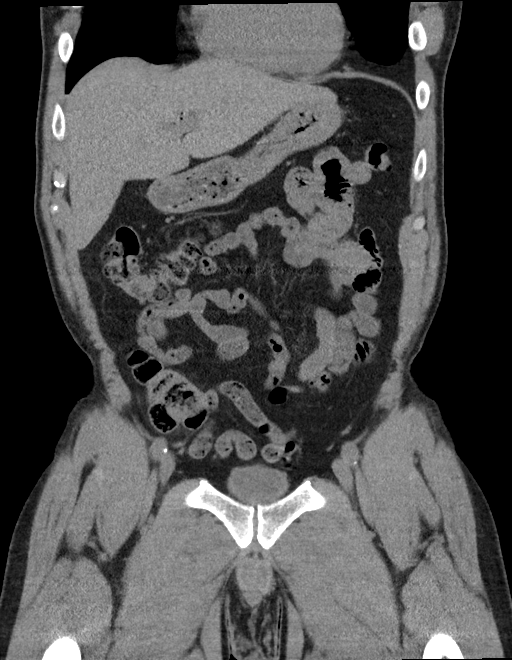
[im 40/90  soft-tissue]
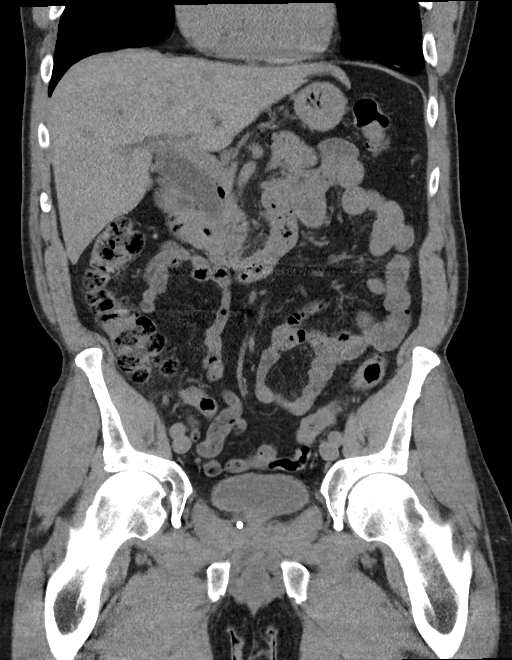
[im 50/90  soft-tissue]
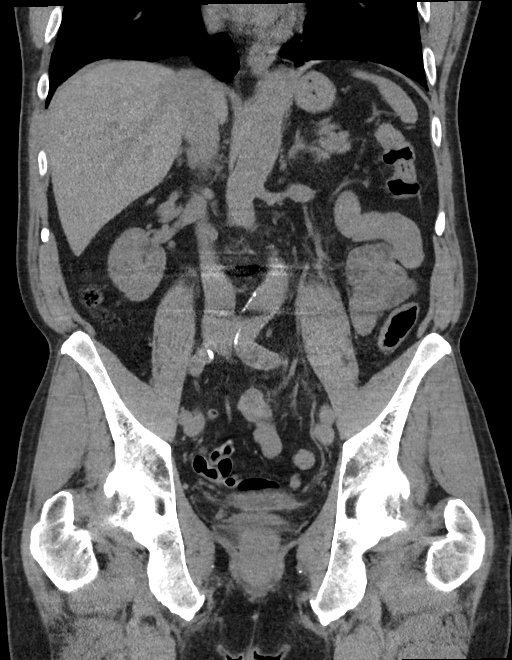

[13 of 46 positions shown; findings below may reference images not displayed]

FINDINGS: Lower chest: Atelectatic changes are present in the otherwise clear
lung bases. Normal heart size. No pericardial effusion.

Hepatobiliary: Smooth liver surface contour. Normal hepatic
attenuation. Focal fatty infiltration at the falciform ligament. No
concerning focal liver lesion within the limitations of an
unenhanced exam. Prior cholecystectomy. Anti dependent pneumobilia
noted within the left lobe liver with a markedly dilated common bile
duct measuring up to 18 mm in diameter though this is an unchanged
finding from comparison CT. No visible calcified gallstones.

Pancreas: No pancreatic ductal dilatation or surrounding
inflammatory changes.

Spleen: Normal in size. No concerning splenic lesions.

Adrenals/Urinary Tract: Normal adrenal glands. Fluid attenuation
cyst in the upper pole left kidney is unchanged from comparison. No
hyperdense focus in the anterior right kidney measuring up to 7 mm
in size is not significantly changed comparison CT in [VI]. Favor
benign hyperdense/proteinaceous cyst no other visible or contour
deforming renal lesions. No hydronephrosis or urolithiasis. Urinary
bladder is unremarkable.

Stomach/Bowel: Distal esophagus, stomach and duodenal sweep are
unremarkable. No small bowel wall thickening or dilatation. No
evidence of obstruction. Retrocecal appendix without periappendiceal
inflammation or other features of acute appendicitis. No colonic
dilatation or wall thickening.

Vascular/Lymphatic: Atherosclerotic calcifications within the
abdominal aorta and branch vessels. No aneurysm or ectasia. No
enlarged abdominopelvic lymph nodes.

Reproductive: The prostate and seminal vesicles are unremarkable.

Other: No abdominopelvic free fluid or free gas. No bowel containing
hernias.

Musculoskeletal: For full findings of the spine, see below. Bones of
pelvis are intact and congruent. Mild degenerative changes of the
hips and pelvis are not significantly advanced from comparison CT
imaging. No suspicious lytic or blastic lesions. Musculature is
normal and symmetric.

CT LUMBAR SPINE FINDINGS

Segmentation: 5 normally formed lumbar type vertebral bodies. Lowest
fully formed disc space denoted as L5-S1 for prior numbering
convention.

Alignment: Mild straightening of the normal lumbar lordosis,
unchanged from comparison. No significant spondylolisthesis or
spondylolysis at the non instrumented levels.

Vertebrae: Posterior decompression with laminectomy L2 as well as
additional decompressive changes and instrumented fusion L3-S1 with
posterior fusion rods and bilateral transpedicular screws and bony
fusion across the posterior facets.

Paraspinal and other soft tissues: No paraspinal fluid, swelling,
gas or hemorrhage. No visible canal hematoma within the limitations
of this unenhanced CT imaging with notable streak artifact from
metallic spinal fixation hardware.

Disc levels:

Level by level evaluation of the lumbar spine below:

T12-L1: No significant spinal canal stenosis or foraminal narrowing.

L1-L2: L2 laminectomy. Disc height loss and global disc bulge. Mild
bilateral facet arthropathy. No significant residual spinal or
foraminal stenosis though some mild lateral recess narrowing is
again noted though better seen on comparison MR.

BETONKO2-L3: L2 laminectomy and decompressive changes L3. Global disc
bulge and bilateral facet arthropathy results in some residual
lateral foraminal stenosis and bilateral foraminal narrowing with
canal stenosis largely mitigated by the posterior decompression.

L3-L4: Wide decompressive laminectomy and posterior fusion. No
residual canal or foraminal narrowing.

L4-L5: Wide decompressive laminectomy and posterior fusion. No
residual canal or foraminal narrowing.

L5-S1: Wide decompressive laminectomy and posterior fusion. No
residual canal or foraminal narrowing.
IMPRESSION: CT abdomen and pelvis:

1. Chronic biliary ductal dilatation and pneumobilia, similar to
comparison exam and likely related to prior instrumentation and post
cholecystectomy reservoir effect. However, if there is clinical
concern for acute biliary pathology, consider serologic evaluation
and if abnormal, MRCP could be obtained.
2. No other acute CT abnormality to provide cause for patient's
symptoms.
3. High attenuation 7 mm focus in the upper pole right kidney.
Compatible with a proteinaceous/hemorrhagic cyst as characterized on
prior MR imaging. Additional simple appearing cysts.

CT lumbar spine:

1. No acute fracture or traumatic malalignment.
2. Prior decompressive changes and L3-S1 posterior lumbar fusion as
detailed above. No acute hardware complication.
3. Some mild residual lateral recess stenosis and foraminal
narrowing noted at L1-2, L2-3.

## 2020-10-08 IMAGING — CR DG SCOLIOSIS EVAL COMPLETE SPINE 2-3V
2 series · 9 of 10 positions shown · non-contrast
Comparison: [DATE]

CLINICAL DATA: Chronic bilateral low back pain, previous back
surgery

EXAM:
DG SCOLIOSIS EVAL COMPLETE SPINE 2-3V

[Series 1: dg scoliosis eval complete spine 2 or 3  · 0.14mm/px · 3 of 4 slices shown]
[im 1/4]
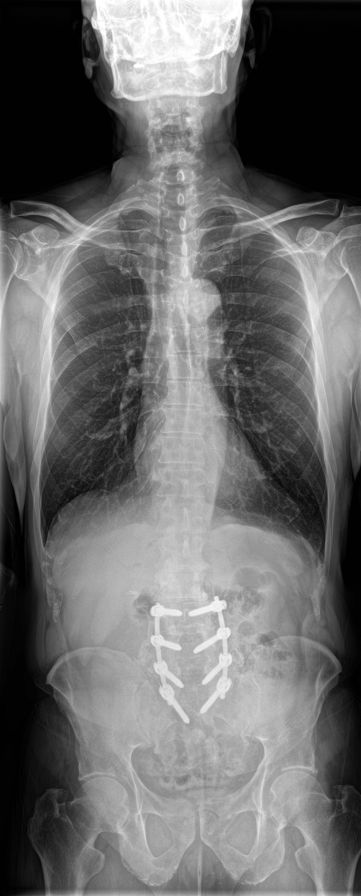
[im 2/4]
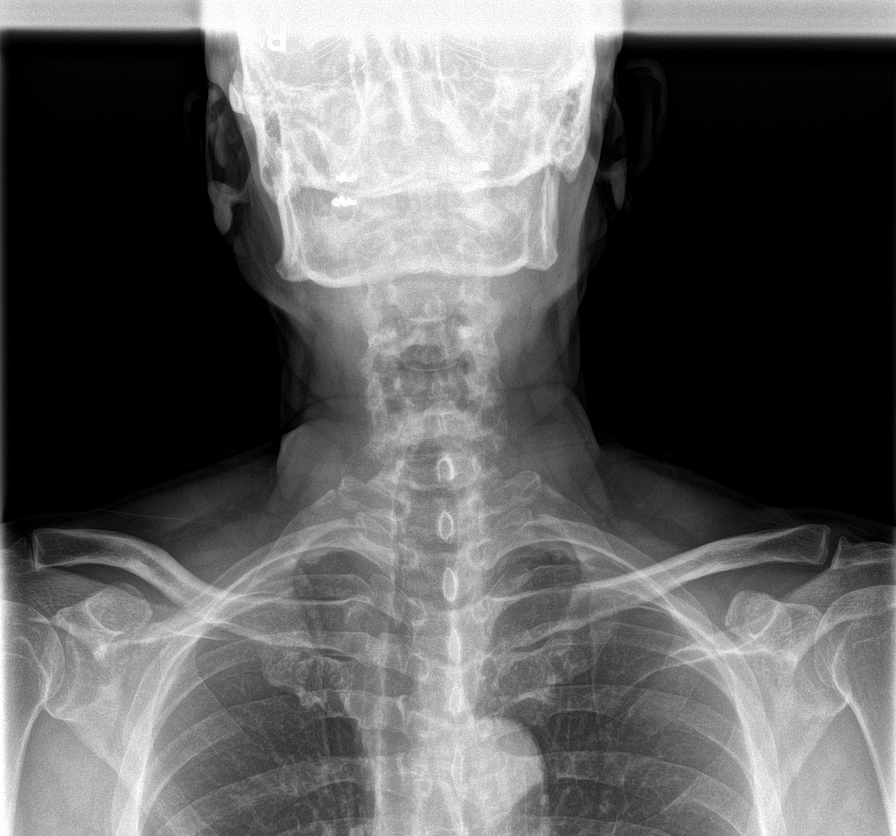
[im 4/4]
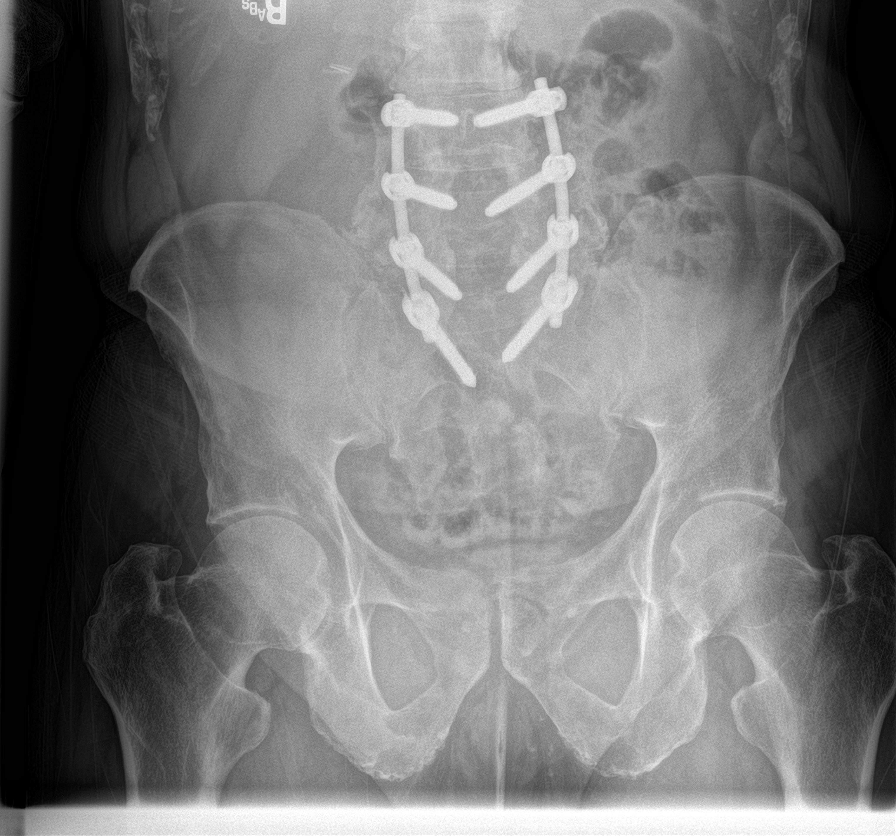

[Series 2: whole body lat · 0.14mm/px · 6 of 8 slices shown]
[im 1/8]
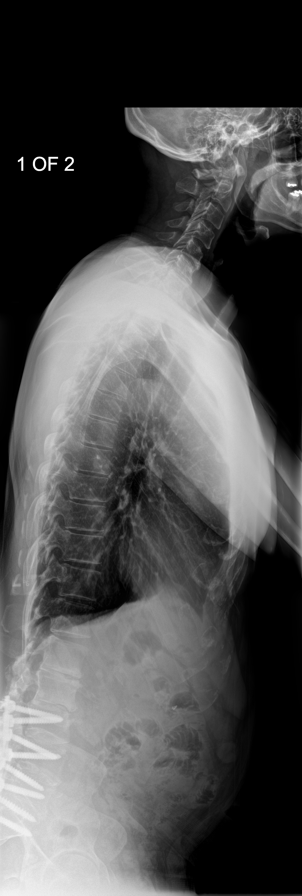
[im 2/8]
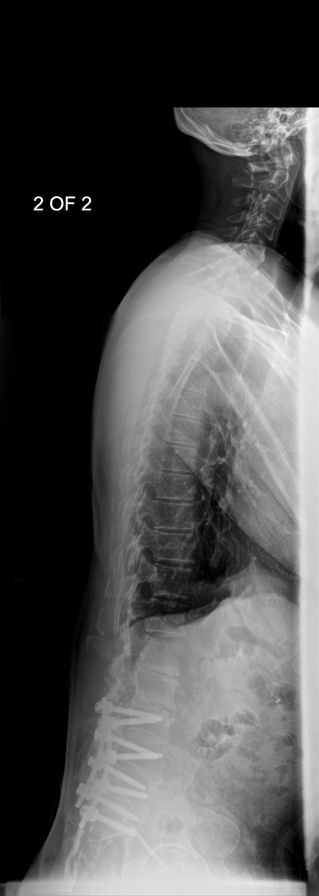
[im 3/8]
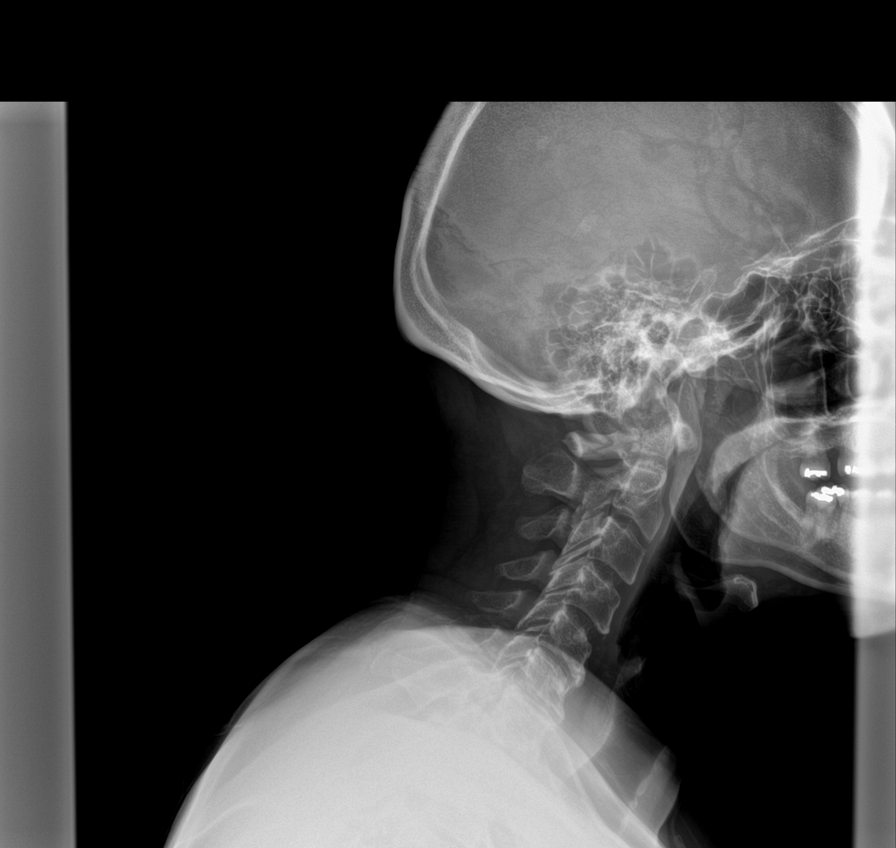
[im 4/8]
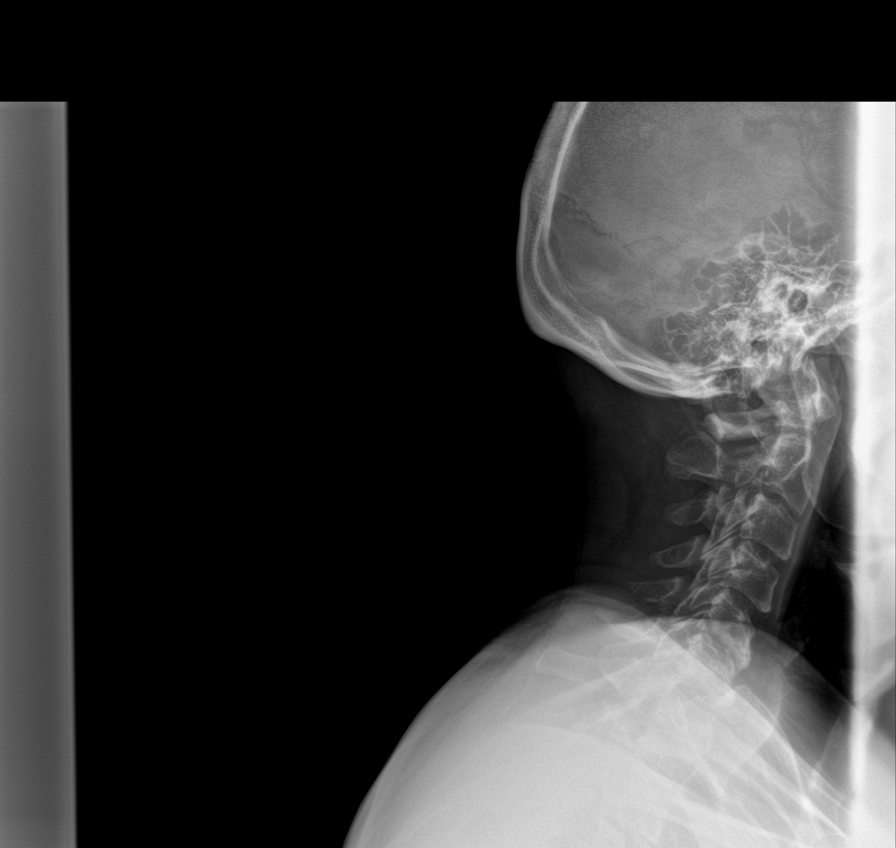
[im 5/8]
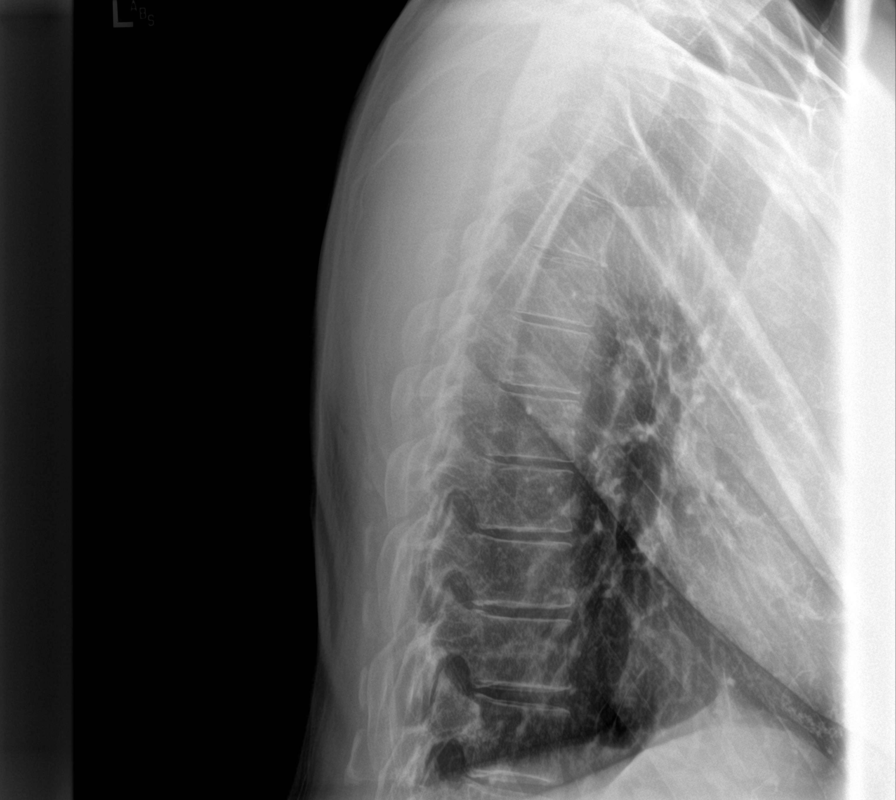
[im 6/8]
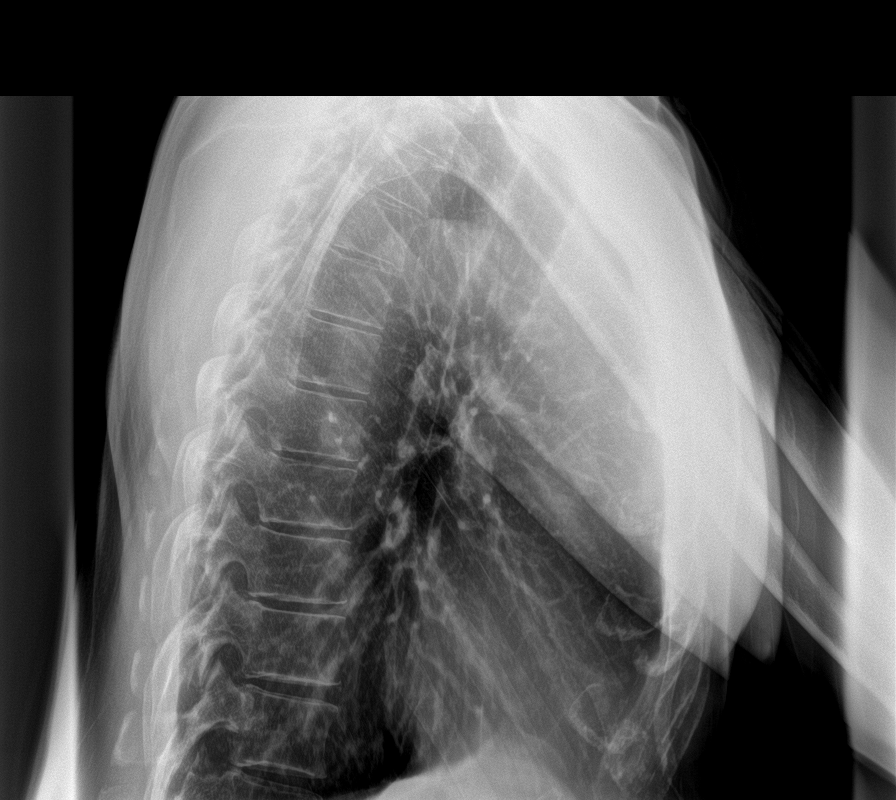

[9 of 10 positions shown; findings below may reference images not displayed]

FINDINGS: Frontal and lateral views of the spine are obtained. There is mild
left convex curvature of the midthoracic spine measuring
approximately 6 degrees, utilizing the superior endplate of T6 and
inferior endplate of T10 as bony landmarks. Otherwise alignment is
anatomic.

There is prominent cervical spondylosis greatest at C5-6, C6-7, and
C7-T1. No cervical spine fracture.

The thoracic disc spaces are well preserved.  No acute fracture.

Prior posterior fusion spanning L3 through S1, with stable
alignment. There is prominent spondylosis at L1-2 and L2-3, with
prominent disc space narrowing and osteophyte formation. No acute
fractures.
IMPRESSION: 1. Mild left convex midthoracic scoliosis measuring 6 degrees.
2. Prominent cervical and lumbar spondylosis as above.
3. Stable postsurgical changes L3 through S1.

## 2020-10-08 MED ORDER — FAMOTIDINE 20 MG PO TABS
40.0000 mg | ORAL_TABLET | Freq: Once | ORAL | Status: AC
  Administered 2020-10-08: 40 mg via ORAL
  Filled 2020-10-08: qty 2

## 2020-10-08 MED ORDER — OXYCODONE-ACETAMINOPHEN 5-325 MG PO TABS
1.0000 | ORAL_TABLET | Freq: Once | ORAL | Status: AC
  Administered 2020-10-08: 1 via ORAL
  Filled 2020-10-08: qty 1

## 2020-10-08 MED ORDER — KETOROLAC TROMETHAMINE 60 MG/2ML IM SOLN
15.0000 mg | Freq: Once | INTRAMUSCULAR | Status: AC
  Administered 2020-10-08: 15 mg via INTRAMUSCULAR
  Filled 2020-10-08: qty 2

## 2020-10-08 MED ORDER — SUCRALFATE 1 G PO TABS
1.0000 g | ORAL_TABLET | Freq: Once | ORAL | Status: AC
  Administered 2020-10-08: 1 g via ORAL
  Filled 2020-10-08: qty 1

## 2020-10-08 NOTE — ED Triage Notes (Signed)
Pt reports lower abd pain.  Diarrhea x 2 today  No vomiting.  Denies urinary sx.  Pt alert  Speech clear.

## 2020-10-09 NOTE — Discharge Instructions (Addendum)
Your CT scans were okay today.  Please continue to follow-up with your doctor for the chronic back pain.

## 2020-10-09 NOTE — ED Provider Notes (Signed)
[facility] Emergency Department Provider Note  ____________________________________________  Time seen: Approximately 12:38 AM  I have reviewed the triage vital signs and the nursing notes.   HISTORY  Chief Complaint Abdominal Pain    HPI Earl Murray is a 65 y.o. male with a history of hypertension, chronic back pain who comes ED complaining of generalized abdominal pain and low back pain across the whole lower back, worse with movement, nonradiating, no alleviating factors, moderate to severe in intensity.  He is following up with neurosurgery due to his back pain.  Denies seeing a pain management specialist.  Had a prescription for 12 pills of Norco filled about a week ago.  No lower extremity paresthesia or weakness, no bowel or bladder incontinence or retention, no other symptoms.  No trauma.      Past Medical History:  Diagnosis Date  . Hypertension   . Neck injury      Patient Active Problem List   Diagnosis Date Noted  . Abnormal magnetic resonance imaging of liver   . Choledocholithiasis 10/25/2019  . Hypertension   . Hallux valgus, acquired 04/20/2016  . 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 Stress reaction 04/20/2016     Past Surgical History:  Procedure Laterality Date  . BACK SURGERY    . CHOLECYSTECTOMY    . ELBOW SURGERY    . ERCP N/A 10/27/2019   Procedure: ENDOSCOPIC RETROGRADE CHOLANGIOPANCREATOGRAPHY (ERCP);  Surgeon: Lucilla Lame, MD;  Location: [facility] ENDOSCOPY;  Service: Endoscopy;  Laterality: N/A;  . FOOT SURGERY    . HERNIA REPAIR     4 hernia repairs     Prior to Admission medications   Medication Sig Start Date End Date Taking? Authorizing Provider  amLODipine (NORVASC) 10 MG tablet Take 10 mg by mouth daily.     [provider]  celecoxib (CELEBREX) 200 MG capsule Take 200 mg by mouth daily. 09/10/20   [provider]  hydrochlorothiazide (HYDRODIURIL) 25 MG tablet Take 1 tablet (25 mg total) by  mouth daily. 08/15/18   Coral Spikes, DO  HYDROcodone-acetaminophen (NORCO/VICODIN) 5-325 MG tablet Take 1-2 tablets by mouth every 6 (six) hours as needed for moderate pain or severe pain. 10/03/20   Melynda Ripple, MD  tiZANidine (ZANAFLEX) 4 MG tablet Take 1 tablet (4 mg total) by mouth every 8 (eight) hours as needed for muscle spasms. 10/03/20   Melynda Ripple, MD  gabapentin (NEURONTIN) 300 MG capsule gabapentin 300 mg capsule  one tab bid-tid  03/01/19  [provider]  ipratropium (ATROVENT) 0.06 % nasal spray Place 2 sprays into both nostrils 4 (four) times daily as needed for rhinitis. 10/22/19 03/16/20  Karen Kitchens, NP  levocetirizine (XYZAL) 5 MG tablet Take 1 tablet (5 mg total) by mouth every evening. 09/30/19 03/16/20  Coral Spikes, DO     Allergies Duloxetine, Lisinopril, Tylenol with codeine #3 [acetaminophen-codeine], Motrin [ibuprofen], and Tramadol   Family History  Problem Relation Age of Onset  . Hypertension Mother   . Heart disease Mother   . Colon cancer Father     Social History Social History   Tobacco Use  . Smoking status: Former Smoker    Types: Cigarettes    Quit date: 05/2018    Years since quitting: 2.3  . Smokeless tobacco: Former Network engineer  . Vaping Use: Never used  Substance Use Topics  . Alcohol use: Not Currently  . Drug use: Not Currently    Review of  Systems  Constitutional:   No fever or chills.  ENT:   No sore throat. No rhinorrhea. Cardiovascular:   No chest pain or syncope. Respiratory:   No dyspnea or cough. Gastrointestinal:   Generalized abdominal pain without vomiting and diarrhea.  Musculoskeletal:   Acute on chronic low back pain All other systems reviewed and are negative except as documented above in ROS and HPI.  ____________________________________________   PHYSICAL EXAM:  VITAL SIGNS: ED Triage Vitals  Enc Vitals Group     BP 10/08/20 2009 (!) 144/102     Pulse Rate 10/08/20 2009 92      Resp 10/08/20 2009 20     Temp 10/08/20 2009 98.7 F (37.1 C)     Temp Source 10/08/20 2009 Oral     SpO2 10/08/20 2009 96 %     Weight 10/08/20 2010 200 lb (90.7 kg)     Height 10/08/20 2010 6\' 5"  (1.956 m)     Head Circumference --      Peak Flow --      Pain Score 10/08/20 2015 10     Pain Loc --      Pain Edu? --      Excl. in [place]? --     Vital signs reviewed, nursing assessments reviewed.   Constitutional:   Alert and oriented. Non-toxic appearance. Eyes:   Conjunctivae are normal. EOMI. PERRL. ENT      Head:   Normocephalic and atraumatic.      Nose:   Wearing a mask.      Mouth/Throat:   Wearing a mask.      Neck:   No meningismus. Full ROM. Hematological/Lymphatic/Immunilogical:   No cervical lymphadenopathy. Cardiovascular:   RRR. Symmetric bilateral radial and DP pulses.  No murmurs. Cap refill less than 2 seconds. Respiratory:   Normal respiratory effort without tachypnea/retractions. Breath sounds are clear and equal bilaterally. No wheezes/rales/rhonchi. Gastrointestinal:   Soft and nontender. Non distended. There is no CVA tenderness.  No rebound, rigidity, or guarding. Genitourinary:   deferred Musculoskeletal:   Normal range of motion in all extremities. No joint effusions.  No lower extremity tenderness.  No edema.  No midline spinal tenderness. Neurologic:   Normal speech and language.  Motor grossly intact. No acute focal neurologic deficits are appreciated.  Skin:    Skin is warm, dry and intact. No rash noted.  No petechiae, purpura, or bullae.  ____________________________________________    LABS (pertinent positives/negatives) (all labs ordered are listed, but only abnormal results are displayed) Labs Reviewed  COMPREHENSIVE METABOLIC PANEL - Abnormal; Notable for the following components:      Result Value   Glucose, Bld 104 (*)    BUN 24 (*)    Creatinine, Ser 1.56 (*)    GFR, Estimated 49 (*)    All other components within normal limits  CBC  - Abnormal; Notable for the following components:   WBC 14.3 (*)    All other components within normal limits  URINALYSIS, COMPLETE (UACMP) WITH MICROSCOPIC - Abnormal; Notable for the following components:   Color, Urine YELLOW (*)    APPearance CLEAR (*)    Hgb urine dipstick SMALL (*)    All other components within normal limits  LIPASE, BLOOD   ____________________________________________   EKG    ____________________________________________    RADIOLOGY  CT ABDOMEN PELVIS WO CONTRAST  Result Date: 10/08/2020 CLINICAL DATA:  Periumbilical pain, 2 years of similar symptoms, history of back surgery EXAM: CT ABDOMEN AND PELVIS  WITHOUT CONTRAST CT LUMBAR SPINE WITHOUT CONTRAST TECHNIQUE: Multidetector CT imaging of the abdomen and pelvis was performed following the standard protocol without IV contrast. Multiplanar CT images of the lumbar spine were reconstructed from contemporary CT of the Chest, Abdomen, and Pelvis COMPARISON:  Contemporary scoliosis radiographs, CT abdomen pelvis 04/06/2020, lumbar MRI 08/30/2020 FINDINGS: Lower chest: Atelectatic changes are present in the otherwise clear lung bases. Normal heart size. No pericardial effusion. Hepatobiliary: Smooth liver surface contour. Normal hepatic attenuation. Focal fatty infiltration at the falciform ligament. No concerning focal liver lesion within the limitations of an unenhanced exam. Prior cholecystectomy. Anti dependent pneumobilia noted within the left lobe liver with a markedly dilated common bile duct measuring up to 18 mm in diameter though this is an unchanged finding from comparison CT. No visible calcified gallstones. Pancreas: No pancreatic ductal dilatation or surrounding inflammatory changes. Spleen: Normal in size. No concerning splenic lesions. Adrenals/Urinary Tract: Normal adrenal glands. Fluid attenuation cyst in the upper pole left kidney is unchanged from comparison. No hyperdense focus in the anterior right  kidney measuring up to 7 mm in size is not significantly changed comparison CT in 2021. Favor benign hyperdense/proteinaceous cyst no other visible or contour deforming renal lesions. No hydronephrosis or urolithiasis. Urinary bladder is unremarkable. Stomach/Bowel: Distal esophagus, stomach and duodenal sweep are unremarkable. No small bowel wall thickening or dilatation. No evidence of obstruction. Retrocecal appendix without periappendiceal inflammation or other features of acute appendicitis. No colonic dilatation or wall thickening. Vascular/Lymphatic: Atherosclerotic calcifications within the abdominal aorta and branch vessels. No aneurysm or ectasia. No enlarged abdominopelvic lymph nodes. Reproductive: The prostate and seminal vesicles are unremarkable. Other: No abdominopelvic free fluid or free gas. No bowel containing hernias. Musculoskeletal: For full findings of the spine, see below. Bones of pelvis are intact and congruent. Mild degenerative changes of the hips and pelvis are not significantly advanced from comparison CT imaging. No suspicious lytic or blastic lesions. Musculature is normal and symmetric. CT LUMBAR SPINE FINDINGS Segmentation: 5 normally formed lumbar type vertebral bodies. Lowest fully formed disc space denoted as L5-S1 for prior numbering convention. Alignment: Mild straightening of the normal lumbar lordosis, unchanged from comparison. No significant spondylolisthesis or spondylolysis at the non instrumented levels. Vertebrae: Posterior decompression with laminectomy L2 as well as additional decompressive changes and instrumented fusion L3-S1 with posterior fusion rods and bilateral transpedicular screws and bony fusion across the posterior facets. Paraspinal and other soft tissues: No paraspinal fluid, swelling, gas or hemorrhage. No visible canal hematoma within the limitations of this unenhanced CT imaging with notable streak artifact from metallic spinal fixation hardware. Disc  levels: Level by level evaluation of the lumbar spine below: T12-L1: No significant spinal canal stenosis or foraminal narrowing. L1-L2: L2 laminectomy. Disc height loss and global disc bulge. Mild bilateral facet arthropathy. No significant residual spinal or foraminal stenosis though some mild lateral recess narrowing is again noted though better seen on comparison MR. L2-L3: L2 laminectomy and decompressive changes L3. Global disc bulge and bilateral facet arthropathy results in some residual lateral foraminal stenosis and bilateral foraminal narrowing with canal stenosis largely mitigated by the posterior decompression. L3-L4: Wide decompressive laminectomy and posterior fusion. No residual canal or foraminal narrowing. L4-L5: Wide decompressive laminectomy and posterior fusion. No residual canal or foraminal narrowing. L5-S1: Wide decompressive laminectomy and posterior fusion. No residual canal or foraminal narrowing. IMPRESSION: CT abdomen and pelvis: 1. Chronic biliary ductal dilatation and pneumobilia, similar to comparison exam and likely related to prior instrumentation and post  cholecystectomy reservoir effect. However, if there is clinical concern for acute biliary pathology, consider serologic evaluation and if abnormal, MRCP could be obtained. 2. No other acute CT abnormality to provide cause for patient's symptoms. 3. High attenuation 7 mm focus in the upper pole right kidney. Compatible with a proteinaceous/hemorrhagic cyst as characterized on prior MR imaging. Additional simple appearing cysts. CT lumbar spine: 1. No acute fracture or traumatic malalignment. 2. Prior decompressive changes and L3-S1 posterior lumbar fusion as detailed above. No acute hardware complication. 3. Some mild residual lateral recess stenosis and foraminal narrowing noted at L1-2, L2-3. Electronically Signed   By: Lovena Le M.D.   On: 10/08/2020 23:53   CT L-SPINE NO CHARGE  Result Date: 10/08/2020 CLINICAL DATA:   Periumbilical pain, 2 years of similar symptoms, history of back surgery EXAM: CT ABDOMEN AND PELVIS WITHOUT CONTRAST CT LUMBAR SPINE WITHOUT CONTRAST TECHNIQUE: Multidetector CT imaging of the abdomen and pelvis was performed following the standard protocol without IV contrast. Multiplanar CT images of the lumbar spine were reconstructed from contemporary CT of the Chest, Abdomen, and Pelvis COMPARISON:  Contemporary scoliosis radiographs, CT abdomen pelvis 04/06/2020, lumbar MRI 08/30/2020 FINDINGS: Lower chest: Atelectatic changes are present in the otherwise clear lung bases. Normal heart size. No pericardial effusion. Hepatobiliary: Smooth liver surface contour. Normal hepatic attenuation. Focal fatty infiltration at the falciform ligament. No concerning focal liver lesion within the limitations of an unenhanced exam. Prior cholecystectomy. Anti dependent pneumobilia noted within the left lobe liver with a markedly dilated common bile duct measuring up to 18 mm in diameter though this is an unchanged finding from comparison CT. No visible calcified gallstones. Pancreas: No pancreatic ductal dilatation or surrounding inflammatory changes. Spleen: Normal in size. No concerning splenic lesions. Adrenals/Urinary Tract: Normal adrenal glands. Fluid attenuation cyst in the upper pole left kidney is unchanged from comparison. No hyperdense focus in the anterior right kidney measuring up to 7 mm in size is not significantly changed comparison CT in 2021. Favor benign hyperdense/proteinaceous cyst no other visible or contour deforming renal lesions. No hydronephrosis or urolithiasis. Urinary bladder is unremarkable. Stomach/Bowel: Distal esophagus, stomach and duodenal sweep are unremarkable. No small bowel wall thickening or dilatation. No evidence of obstruction. Retrocecal appendix without periappendiceal inflammation or other features of acute appendicitis. No colonic dilatation or wall thickening.  Vascular/Lymphatic: Atherosclerotic calcifications within the abdominal aorta and branch vessels. No aneurysm or ectasia. No enlarged abdominopelvic lymph nodes. Reproductive: The prostate and seminal vesicles are unremarkable. Other: No abdominopelvic free fluid or free gas. No bowel containing hernias. Musculoskeletal: For full findings of the spine, see below. Bones of pelvis are intact and congruent. Mild degenerative changes of the hips and pelvis are not significantly advanced from comparison CT imaging. No suspicious lytic or blastic lesions. Musculature is normal and symmetric. CT LUMBAR SPINE FINDINGS Segmentation: 5 normally formed lumbar type vertebral bodies. Lowest fully formed disc space denoted as L5-S1 for prior numbering convention. Alignment: Mild straightening of the normal lumbar lordosis, unchanged from comparison. No significant spondylolisthesis or spondylolysis at the non instrumented levels. Vertebrae: Posterior decompression with laminectomy L2 as well as additional decompressive changes and instrumented fusion L3-S1 with posterior fusion rods and bilateral transpedicular screws and bony fusion across the posterior facets. Paraspinal and other soft tissues: No paraspinal fluid, swelling, gas or hemorrhage. No visible canal hematoma within the limitations of this unenhanced CT imaging with notable streak artifact from metallic spinal fixation hardware. Disc levels: Level by level evaluation of the  lumbar spine below: T12-L1: No significant spinal canal stenosis or foraminal narrowing. L1-L2: L2 laminectomy. Disc height loss and global disc bulge. Mild bilateral facet arthropathy. No significant residual spinal or foraminal stenosis though some mild lateral recess narrowing is again noted though better seen on comparison MR. L2-L3: L2 laminectomy and decompressive changes L3. Global disc bulge and bilateral facet arthropathy results in some residual lateral foraminal stenosis and bilateral  foraminal narrowing with canal stenosis largely mitigated by the posterior decompression. L3-L4: Wide decompressive laminectomy and posterior fusion. No residual canal or foraminal narrowing. L4-L5: Wide decompressive laminectomy and posterior fusion. No residual canal or foraminal narrowing. L5-S1: Wide decompressive laminectomy and posterior fusion. No residual canal or foraminal narrowing. IMPRESSION: CT abdomen and pelvis: 1. Chronic biliary ductal dilatation and pneumobilia, similar to comparison exam and likely related to prior instrumentation and post cholecystectomy reservoir effect. However, if there is clinical concern for acute biliary pathology, consider serologic evaluation and if abnormal, MRCP could be obtained. 2. No other acute CT abnormality to provide cause for patient's symptoms. 3. High attenuation 7 mm focus in the upper pole right kidney. Compatible with a proteinaceous/hemorrhagic cyst as characterized on prior MR imaging. Additional simple appearing cysts. CT lumbar spine: 1. No acute fracture or traumatic malalignment. 2. Prior decompressive changes and L3-S1 posterior lumbar fusion as detailed above. No acute hardware complication. 3. Some mild residual lateral recess stenosis and foraminal narrowing noted at L1-2, L2-3. Electronically Signed   By: Lovena Le M.D.   On: 10/08/2020 23:53    ____________________________________________   PROCEDURES Procedures  ____________________________________________  DIFFERENTIAL DIAGNOSIS   Diverticulitis, hernia, compression fracture of the lumbar spine, pancreatitis  CLINICAL IMPRESSION / ASSESSMENT AND PLAN / ED COURSE  Medications ordered in the ED: Medications  ketorolac (TORADOL) injection 15 mg (15 mg Intramuscular Given 10/08/20 2217)  famotidine (PEPCID) tablet 40 mg (40 mg Oral Given 10/08/20 2215)  sucralfate (CARAFATE) tablet 1 g (1 g Oral Given 10/08/20 2216)  oxyCODONE-acetaminophen (PERCOCET/ROXICET) 5-325 MG per  tablet 1 tablet (1 tablet Oral Given 10/08/20 2216)    Pertinent labs & imaging results that were available during my care of the patient were reviewed by me and considered in my medical decision making (see chart for details).  Earl Murray was evaluated in Emergency Department on 10/09/2020 for the symptoms described in the history of present illness. He was evaluated in the context of the global COVID-19 pandemic, which necessitated consideration that the patient might be at risk for infection with the SARS-CoV-2 virus that causes COVID-19. Institutional protocols and algorithms that pertain to the evaluation of patients at risk for COVID-19 are in a state of rapid change based on information released by regulatory bodies including the CDC and federal and state organizations. These policies and algorithms were followed during the patient's care in the ED.   Patient presents with generalized abdominal pain and low back pain.  Exam is nonfocal.  Neurologically intact, doubt epidural abscess or central cord syndrome or cauda equina.  Overall abdomen is nonsurgical.  Vital signs are normal, he is nontoxic.  Reviewed the PDMP which shows frequent opioid prescriptions from various clinicians.  May be an element of drug-seeking behavior and chronic pain and opioid dependence.  Patient given IM Toradol 15 mg as well as 1 Percocet here for pain relief while obtaining a CT which is unremarkable.  Lab panel was also unremarkable.  Stable for discharge to follow-up with his doctors as previously arranged.  ____________________________________________   FINAL CLINICAL IMPRESSION(S) / ED DIAGNOSES    Final diagnoses:  Low back pain  Chronic pain   ED Discharge Orders    None      Portions of this note were generated with dragon dictation software. Dictation errors may occur despite best attempts at proofreading.   Carrie Mew, MD 10/09/20 [id number]

## 2020-11-01 ENCOUNTER — Ambulatory Visit: Payer: Medicare (Managed Care)

## 2020-12-13 ENCOUNTER — Emergency Department
Admission: EM | Admit: 2020-12-13 | Discharge: 2020-12-14 | Disposition: A | Discharge status: Home / Self Care | Payer: Medicare (Managed Care) | Attending: Emergency Medicine | Admitting: Emergency Medicine

## 2020-12-13 ENCOUNTER — Other Ambulatory Visit: Payer: Self-pay

## 2020-12-13 ENCOUNTER — Encounter: Payer: Self-pay | Admitting: Emergency Medicine

## 2020-12-13 DIAGNOSIS — M545 Low back pain, unspecified: Secondary | ICD-10-CM | POA: Insufficient documentation

## 2020-12-13 DIAGNOSIS — Z79899 Other long term (current) drug therapy: Secondary | ICD-10-CM | POA: Diagnosis not present

## 2020-12-13 DIAGNOSIS — R109 Unspecified abdominal pain: Secondary | ICD-10-CM | POA: Diagnosis not present

## 2020-12-13 DIAGNOSIS — Z87891 Personal history of nicotine dependence: Secondary | ICD-10-CM | POA: Insufficient documentation

## 2020-12-13 DIAGNOSIS — G8929 Other chronic pain: Secondary | ICD-10-CM | POA: Diagnosis not present

## 2020-12-13 DIAGNOSIS — I10 Essential (primary) hypertension: Secondary | ICD-10-CM | POA: Diagnosis not present

## 2020-12-13 DIAGNOSIS — R197 Diarrhea, unspecified: Secondary | ICD-10-CM | POA: Diagnosis not present

## 2020-12-13 LAB — COMPREHENSIVE METABOLIC PANEL
ALT: 15 U/L (ref 0–44)
AST: 22 U/L (ref 15–41)
Albumin: 4.8 g/dL (ref 3.5–5.0)
Alkaline Phosphatase: 101 U/L (ref 38–126)
Anion gap: 9 (ref 5–15)
BUN: 20 mg/dL (ref 8–23)
CO2: 29 mmol/L (ref 22–32)
Calcium: 10.4 mg/dL — ABNORMAL HIGH (ref 8.9–10.3)
Chloride: 102 mmol/L (ref 98–111)
Creatinine, Ser: 1.36 mg/dL — ABNORMAL HIGH (ref 0.61–1.24)
GFR, Estimated: 57 mL/min — ABNORMAL LOW (ref >60)
Glucose, Bld: 104 mg/dL — ABNORMAL HIGH (ref 70–99)
Potassium: 3.6 mmol/L (ref 3.5–5.1)
Sodium: 140 mmol/L (ref 135–145)
Total Bilirubin: 0.8 mg/dL (ref 0.3–1.2)
Total Protein: 9 g/dL — ABNORMAL HIGH (ref 6.5–8.1)

## 2020-12-13 LAB — CBC
HCT: 44.2 % (ref 39.0–52.0)
Hemoglobin: 15.1 g/dL (ref 13.0–17.0)
MCH: 31.5 pg (ref 26.0–34.0)
MCHC: 34.2 g/dL (ref 30.0–36.0)
MCV: 92.3 fL (ref 80.0–100.0)
Platelets: 368 10*3/uL (ref 150–400)
RBC: 4.79 MIL/uL (ref 4.22–5.81)
RDW: 14 % (ref 11.5–15.5)
WBC: 12.8 10*3/uL — ABNORMAL HIGH (ref 4.0–10.5)
nRBC: 0 % (ref 0.0–0.2)

## 2020-12-13 LAB — LIPASE, BLOOD: Lipase: 39 U/L (ref 11–51)

## 2020-12-13 MED ORDER — DICYCLOMINE HCL 10 MG PO CAPS: 10.0000 mg | ORAL_CAPSULE | Freq: Three times a day (TID) | ORAL | 0 refills | Status: DC | PRN

## 2020-12-13 MED ORDER — SUCRALFATE 1 G PO TABS
1.0000 g | ORAL_TABLET | Freq: Once | ORAL | Status: AC
  Administered 2020-12-13: 1 g via ORAL
  Filled 2020-12-13: qty 1

## 2020-12-13 MED ORDER — PANTOPRAZOLE SODIUM 40 MG PO TBEC
40.0000 mg | DELAYED_RELEASE_TABLET | Freq: Once | ORAL | Status: AC
  Administered 2020-12-13: 40 mg via ORAL
  Filled 2020-12-13: qty 1

## 2020-12-13 MED ORDER — SUCRALFATE 1 G PO TABS: 1.0000 g | ORAL_TABLET | Freq: Four times a day (QID) | ORAL | 1 refills | Status: DC | PRN

## 2020-12-13 MED ORDER — OMEPRAZOLE MAGNESIUM 20 MG PO TBEC: 20.0000 mg | DELAYED_RELEASE_TABLET | Freq: Every day | ORAL | 1 refills | Status: DC

## 2020-12-13 MED ORDER — DICYCLOMINE HCL 10 MG PO CAPS
10.0000 mg | ORAL_CAPSULE | Freq: Once | ORAL | Status: AC
  Administered 2020-12-13: 10 mg via ORAL
  Filled 2020-12-13: qty 1

## 2020-12-13 NOTE — Discharge Instructions (Signed)
As we discussed, there was no evidence of a new or emergent medical condition tonight.  Unfortunately, there is no good solution for your ongoing issues.  However we recommend that you try the prescribed medications.  Continue taking the medications that you already have at home including your pain medicine.  When you follow-up with your regular doctor, let him or her know how these medications help with your stomach discomfort.    Return to the emergency department if you develop new or worsening symptoms that concern you.

## 2020-12-13 NOTE — ED Provider Notes (Signed)
[facility] Emergency Department Provider Note  ____________________________________________   Event Date/Time   First MD Initiated Contact with Patient 12/13/20 2302     (approximate)  I have reviewed the triage vital signs and the nursing notes.   HISTORY  Chief Complaint Abdominal Pain and Back Pain    HPI Earl Murray is a 65 y.o. male with multiple chronic issues including chronic low back pain and chronic abdominal pain with diarrhea.  He presents tonight for an exacerbation of his chronic abdominal pain and diarrhea.  He is unable to describe it in detail but says it is an aching and occasionally cramping discomfort.  He is prescribed pain medicine at the [place] but said it does not seem to help.  He has had nausea but no vomiting.  He denies fever, sore throat, chest pain, shortness of breath.  Nothing in particular makes it better and eating seems to make it worse.  He has follow-up appointments with GI but that is not until November.  He claims that his doctors have told him to stop taking PPIs and other medicine for his stomach until he can follow-up with the GI specialist, so he says that he is not taking anything specifically for his stomach.  He is status postcholecystectomy within the last couple of years and had an ERCP last year.     Past Medical History:  Diagnosis Date   Hypertension    Neck injury     Patient Active Problem List   Diagnosis Date Noted   Abnormal magnetic resonance imaging of liver    Choledocholithiasis 10/25/2019   Hypertension    Hallux valgus, acquired 04/20/2016   Hammer toes of both feet 04/20/2016   Stress reaction 04/20/2016    Past Surgical History:  Procedure Laterality Date   BACK SURGERY     CHOLECYSTECTOMY     ELBOW SURGERY     ERCP N/A 10/27/2019   Procedure: ENDOSCOPIC RETROGRADE CHOLANGIOPANCREATOGRAPHY (ERCP);  Surgeon: Lucilla Lame, MD;  Location: [facility] ENDOSCOPY;  Service: Endoscopy;  Laterality:  N/A;   FOOT SURGERY     HERNIA REPAIR     4 hernia repairs    Prior to Admission medications   Medication Sig Start Date End Date Taking? Authorizing Provider  dicyclomine (BENTYL) 10 MG capsule Take 1 capsule (10 mg total) by mouth 3 (three) times daily as needed for up to 14 days for spasms. or abdominal pain 12/13/20 12/27/20 Yes Hinda Kehr, MD  omeprazole (PRILOSEC OTC) 20 MG tablet Take 1 tablet (20 mg total) by mouth daily. 12/13/20 12/13/21 Yes Hinda Kehr, MD  sucralfate (CARAFATE) 1 g tablet Take 1 tablet (1 g total) by mouth 4 (four) times daily as needed (for abdominal discomfort, nausea, and/or vomiting). 12/13/20  Yes Hinda Kehr, MD  amLODipine (NORVASC) 10 MG tablet Take 10 mg by mouth daily.     [provider]  celecoxib (CELEBREX) 200 MG capsule Take 200 mg by mouth daily. 09/10/20   [provider]  hydrochlorothiazide (HYDRODIURIL) 25 MG tablet Take 1 tablet (25 mg total) by mouth daily. 08/15/18   Coral Spikes, DO  HYDROcodone-acetaminophen (NORCO/VICODIN) 5-325 MG tablet Take 1-2 tablets by mouth every 6 (six) hours as needed for moderate pain or severe pain. 10/03/20   Melynda Ripple, MD  tiZANidine (ZANAFLEX) 4 MG tablet Take 1 tablet (4 mg total) by mouth every 8 (eight) hours as needed for muscle spasms. 10/03/20   Melynda Ripple, MD  gabapentin (NEURONTIN) 300  MG capsule gabapentin 300 mg capsule  one tab bid-tid  03/01/19  [provider]  ipratropium (ATROVENT) 0.06 % nasal spray Place 2 sprays into both nostrils 4 (four) times daily as needed for rhinitis. 10/22/19 03/16/20  Karen Kitchens, NP  levocetirizine (XYZAL) 5 MG tablet Take 1 tablet (5 mg total) by mouth every evening. 09/30/19 03/16/20  Coral Spikes, DO    Allergies Duloxetine, Lisinopril, Tylenol with codeine #3 [acetaminophen-codeine], Motrin [ibuprofen], and Tramadol  Family History  Problem Relation Age of Onset   Hypertension Mother    Heart disease Mother    Colon  cancer Father     Social History Social History   Tobacco Use   Smoking status: Former    Types: Cigarettes    Quit date: 05/2018    Years since quitting: 2.5   Smokeless tobacco: Former  Scientific laboratory technician Use: Never used  Substance Use Topics   Alcohol use: Not Currently   Drug use: Not Currently    Review of Systems Constitutional: No fever/chills Eyes: No visual changes. ENT: No sore throat. Cardiovascular: Denies chest pain. Respiratory: Denies shortness of breath. Gastrointestinal: Chronic abdominal pain with nausea and diarrhea but no vomiting. Genitourinary: Negative for dysuria. Musculoskeletal: Chronic low back pain. Integumentary: Negative for rash. Neurological: Negative for headaches, focal weakness or numbness.   ____________________________________________   PHYSICAL EXAM:  VITAL SIGNS: ED Triage Vitals  Enc Vitals Group     BP 12/13/20 1621 (!) 162/95     Pulse Rate 12/13/20 1621 (!) 110     Resp 12/13/20 1621 20     Temp 12/13/20 1621 99.1 F (37.3 C)     Temp Source 12/13/20 1621 Oral     SpO2 12/13/20 1621 99 %     Weight 12/13/20 1622 90.7 kg (200 lb)     Height 12/13/20 1622 1.956 m ('6\' 5"'$ )     Head Circumference --      Peak Flow --      Pain Score 12/13/20 1622 10     Pain Loc --      Pain Edu? --      Excl. in [place]? --     Constitutional: Alert and oriented.  Eyes: Conjunctivae are normal.  Head: Atraumatic. Nose: No congestion/rhinnorhea. Mouth/Throat: Patient is wearing a mask. Neck: No stridor.  No meningeal signs.   Cardiovascular: Normal rate, regular rhythm. Good peripheral circulation. Respiratory: Normal respiratory effort.  No retractions. Gastrointestinal: Soft and nontender. No distention.  I was not able to elicit any discomfort upon palpation in any quadrant. Musculoskeletal: No lower extremity tenderness nor edema. No gross deformities of extremities. Neurologic:  Normal speech and language. No gross focal  neurologic deficits are appreciated.  Skin:  Skin is warm, dry and intact. Psychiatric: Mood and affect are normal. Speech and behavior are normal.  ____________________________________________   LABS (all labs ordered are listed, but only abnormal results are displayed)  Labs Reviewed  COMPREHENSIVE METABOLIC PANEL - Abnormal; Notable for the following components:      Result Value   Glucose, Bld 104 (*)    Creatinine, Ser 1.36 (*)    Calcium 10.4 (*)    Total Protein 9.0 (*)    GFR, Estimated 57 (*)    All other components within normal limits  CBC - Abnormal; Notable for the following components:   WBC 12.8 (*)    All other components within normal limits  LIPASE, BLOOD  URINALYSIS, COMPLETE (UACMP) WITH  MICROSCOPIC   ____________________________________________  EKG  ED ECG REPORT I, Hinda Kehr, the attending physician, personally viewed and interpreted this ECG.  Date: 12/13/2020 EKG Time: 16: 26 Rate: 103 Rhythm: Sinus tachycardia QRS Axis: normal Intervals: normal ST/T Wave abnormalities: Non-specific ST segment / T-wave changes, but no clear evidence of acute ischemia. Narrative Interpretation: no definitive evidence of acute ischemia; does not meet STEMI criteria.  ____________________________________________   INITIAL IMPRESSION / MDM / ASSESSMENT AND PLAN / ED COURSE  As part of my medical decision making, I reviewed the following data within the [place] notes reviewed and incorporated, Labs reviewed , EKG interpreted , Old chart reviewed, and Notes from prior ED visits, and reviewed the [place] controlled substance database.   Differential diagnosis includes, but is not limited to, acute on chronic abdominal pain, acid reflux, esophagitis, bacterial or viral infection, SBO/ileus.  In spite of the patient's symptoms, he is generally well-appearing and in no distress.  Vital signs are stable and within normal limits.   Due to boarding, hospital volume, and overwhelming ED patient volume, the patient had been waiting for about 7 hours by the time I saw him.  He is in no distress with persistently stable vital signs.  I am not able to elicit any abdominal tenderness to palpation.  Overall his exam is reassuring and I think his presentation tonight is due to persistent chronic issues.  I talked with the patient about this and his generally reassuring lab work which shows kidney function that is actually slightly better than his baseline.  He has a mild leukocytosis of 12.8 which is nonspecific and a normal lipase.  He has had an abdominal CT within the last 2 months for a visit with similar symptoms and there is no indication for repeat imaging tonight given no tenderness to palpation and otherwise reassuring assessment.  He says he is not currently taking anything specific for his stomach so I gave him a dose of Bentyl 10 mg p.o., pantoprazole, and Carafate.  I also wrote prescriptions for these medications and encouraged him to try them and let his GI doctor know how they worked.  He asked for something for pain and I pointed out that he has prescriptions for chronic pain medicine and can take them at home.  I gave my usual and customary return precautions and he says that he understands and agrees with the plan.    ____________________________________________  FINAL CLINICAL IMPRESSION(S) / ED DIAGNOSES  Final diagnoses:  Chronic abdominal pain  Chronic low back pain, unspecified back pain laterality, unspecified whether sciatica present     MEDICATIONS GIVEN DURING THIS VISIT:  Medications  dicyclomine (BENTYL) capsule 10 mg (10 mg Oral Given 12/13/20 2337)  pantoprazole (PROTONIX) EC tablet 40 mg (40 mg Oral Given 12/13/20 2336)  sucralfate (CARAFATE) tablet 1 g (1 g Oral Given 12/13/20 2336)     ED Discharge Orders          Ordered    omeprazole (PRILOSEC OTC) 20 MG tablet  Daily        12/13/20 2333     sucralfate (CARAFATE) 1 g tablet  4 times daily PRN        12/13/20 2333    dicyclomine (BENTYL) 10 MG capsule  3 times daily PRN        12/13/20 2333             Note:  This document was prepared using Dragon voice recognition  software and may include unintentional dictation errors.   Hinda Kehr, MD 12/13/20 [id number]

## 2020-12-13 NOTE — ED Triage Notes (Addendum)
Pt reports back pain for awhile and is scheduled for surgery in September. Pt states yesterday started with abd pain and diarrhea. Pt reports abd issues for awhile as well and is being followed by a GI MD but sx's are no better

## 2020-12-14 NOTE — ED Notes (Addendum)
This RN went to D/C the patient and the patient was no longer in the room. Unable to obtain VS, E-signature, and provide D/C information.

## 2020-12-15 ENCOUNTER — Encounter: Payer: Self-pay | Admitting: Emergency Medicine

## 2020-12-15 ENCOUNTER — Other Ambulatory Visit: Payer: Self-pay

## 2020-12-15 ENCOUNTER — Ambulatory Visit
Admission: EM | Admit: 2020-12-15 | Discharge: 2020-12-15 | Disposition: A | Discharge status: Home / Self Care | Payer: Medicare (Managed Care)

## 2020-12-15 DIAGNOSIS — J069 Acute upper respiratory infection, unspecified: Secondary | ICD-10-CM | POA: Diagnosis not present

## 2020-12-15 MED ORDER — FEXOFENADINE HCL 60 MG PO TABS: 60.0000 mg | ORAL_TABLET | Freq: Every day | ORAL | 0 refills | Status: DC

## 2020-12-15 MED ORDER — FLUTICASONE PROPIONATE 50 MCG/ACT NA SUSP: 2.0000 | Freq: Every day | NASAL | 0 refills | Status: DC

## 2020-12-15 NOTE — Discharge Instructions (Addendum)
Due to your kidney function being slow, we cant give you Toradol for pain. You also need to avoid any kind of Ibuprofen medication. But your liver function is normal, so you can take Tylenol up to 1000 mg at a time every 8 hours, max 3,000 mg in 24 hours. You were given percocet at the [place] on 7/1 to last you one month, so you need to stay on the regimen your doctor has you on there.

## 2020-12-15 NOTE — ED Provider Notes (Signed)
[facility]    CSN: [id number] Arrival date & time: 12/15/20  0934      History   Chief Complaint Chief Complaint  Patient presents with   Back Pain   Sinus Problem    HPI Earl Murray is a 65 y.o. male who presents with onset of stuffy nose 6 days ago, and has constant post nasal drainage. Has been taking Claritin, zycam, sudafed and they have not worked. Has not had fever, chills or sweats. Denies fatigue or decreased appetite. Has not done a covid test at home.  He also is requesting pain med for his back.  He was seen 7/22 in ER for back and abdominal pain and his history with them and what he stated to me today are inconsistent regarding his pain medication. He told ER that "his doctors'' told him to stop PPI's and any other stomach meds. And has apt to see GI in November. When I asked him [facility], he said that his GI Dr told him to stop the percocet because this will hurt his stomach. He was prescribed # 90 percocet's on 7/1 Per records has apt with neurosurgery 9/26 and pt has requested to move the apt. Up.    Past Medical History:  Diagnosis Date   Hypertension    Neck injury     Patient Active Problem List   Diagnosis Date Noted   Abnormal magnetic resonance imaging of liver    Choledocholithiasis 10/25/2019   Hypertension    Hallux valgus, acquired 04/20/2016   Hammer toes of both feet 04/20/2016   Stress reaction 04/20/2016    Past Surgical History:  Procedure Laterality Date   BACK SURGERY     CHOLECYSTECTOMY     ELBOW SURGERY     ERCP N/A 10/27/2019   Procedure: ENDOSCOPIC RETROGRADE CHOLANGIOPANCREATOGRAPHY (ERCP);  Surgeon: Lucilla Lame, MD;  Location: [facility] ENDOSCOPY;  Service: Endoscopy;  Laterality: N/A;   FOOT SURGERY     HERNIA REPAIR     4 hernia repairs       Home Medications    Prior to Admission medications   Medication Sig Start Date End Date Taking? Authorizing Provider  amLODipine  (NORVASC) 10 MG tablet Take 10 mg by mouth daily.    Yes [provider]  atorvastatin (LIPITOR) 20 MG tablet TAKE ONE-HALF TABLET BY MOUTH AT BEDTIME FOR CHOLESTEROL 05/23/20  Yes [provider]  cyclobenzaprine (FLEXERIL) 10 MG tablet Take by mouth. 03/09/15  Yes [provider]  fexofenadine (ALLEGRA ALLERGY) 60 MG tablet Take 1 tablet (60 mg total) by mouth daily. For day time drainage 12/15/20  Yes Rodriguez-Southworth, Sunday Spillers, PA-C  fluticasone (FLONASE) 50 MCG/ACT nasal spray Place 2 sprays into both nostrils daily. At night time for stuffy nose 12/15/20  Yes Rodriguez-Southworth, Sunday Spillers, PA-C  hydrochlorothiazide (HYDRODIURIL) 25 MG tablet Take 1 tablet (25 mg total) by mouth daily. 08/15/18  Yes Cook, Jayce G, DO  mirtazapine (REMERON) 7.5 MG tablet TAKE ONE TABLET BY MOUTH AT BEDTIME FOR 7 DAYS, THEN TAKE TWO TABLETS AT BEDTIME 11/25/20  Yes [provider]  venlafaxine XR (EFFEXOR-XR) 37.5 MG 24 hr capsule TAKE TWO CAPSULES BY MOUTH EVERY DAY FOR 7 DAYS, THEN TAKE ONE CAPSULE EVERY DAY FOR 7 DAYS 11/13/20  Yes [provider]  dicyclomine (BENTYL) 10 MG capsule Take 1 capsule (10 mg total) by mouth 3 (three) times daily as needed for up to 14 days for spasms. or abdominal  pain 12/13/20 12/27/20  Hinda Kehr, MD  HYDROcodone-acetaminophen (NORCO/VICODIN) 5-325 MG tablet Take 1-2 tablets by mouth every 6 (six) hours as needed for moderate pain or severe pain. 10/03/20   Melynda Ripple, MD  omeprazole (PRILOSEC OTC) 20 MG tablet Take 1 tablet (20 mg total) by mouth daily. 12/13/20 12/13/21  Hinda Kehr, MD  sucralfate (CARAFATE) 1 g tablet Take 1 tablet (1 g total) by mouth 4 (four) times daily as needed (for abdominal discomfort, nausea, and/or vomiting). 12/13/20   Hinda Kehr, MD  gabapentin (NEURONTIN) 300 MG capsule gabapentin 300 mg capsule  one tab bid-tid  03/01/19  [provider]  ipratropium (ATROVENT) 0.06 % nasal spray Place 2  sprays into both nostrils 4 (four) times daily as needed for rhinitis. 10/22/19 03/16/20  Karen Kitchens, NP  levocetirizine (XYZAL) 5 MG tablet Take 1 tablet (5 mg total) by mouth every evening. 09/30/19 03/16/20  Coral Spikes, DO    Family History Family History  Problem Relation Age of Onset   Hypertension Mother    Heart disease Mother    Colon cancer Father     Social History Social History   Tobacco Use   Smoking status: Former    Types: Cigarettes    Quit date: 05/2018    Years since quitting: 2.5   Smokeless tobacco: Former  Scientific laboratory technician Use: Never used  Substance Use Topics   Alcohol use: Not Currently   Drug use: Not Currently     Allergies   Duloxetine, Lisinopril, Tylenol with codeine #3 [acetaminophen-codeine], Motrin [ibuprofen], and Tramadol   Review of Systems Review of Systems  Constitutional:  Negative for activity change, appetite change, chills, diaphoresis, fatigue and fever.  HENT:  Positive for congestion and postnasal drip. Negative for ear discharge, ear pain, rhinorrhea and sore throat.   Eyes:  Negative for discharge.  Respiratory:  Negative for cough, chest tightness and shortness of breath.   Genitourinary:  Negative for dysuria and frequency.  Musculoskeletal:  Positive for back pain.  Skin:  Negative for rash and wound.  Neurological:  Negative for headaches.  Hematological:  Negative for adenopathy.    Physical Exam Triage Vital Signs ED Triage Vitals  Enc Vitals Group     BP 12/15/20 1014 (!) 143/102     Pulse Rate 12/15/20 1014 92     Resp 12/15/20 1014 16     Temp 12/15/20 1014 98.4 F (36.9 C)     Temp Source 12/15/20 1014 Oral     SpO2 12/15/20 1014 100 %     Weight 12/15/20 1010 200 lb (90.7 kg)     Height 12/15/20 1010 '6\' 5"'$  (1.956 m)     Head Circumference --      Peak Flow --      Pain Score 12/15/20 1009 10     Pain Loc --      Pain Edu? --      Excl. in [place]? --    No data found.  Updated Vital Signs BP  (!) 143/102 (BP Location: Left Arm)   Pulse 92   Temp 98.4 F (36.9 C) (Oral)   Resp 16   Ht '6\' 5"'$  (1.956 m)   Wt 200 lb (90.7 kg)   SpO2 100%   BMI 23.72 kg/m   Visual Acuity Right Eye Distance:   Left Eye Distance:   Bilateral Distance:    Right Eye Near:   Left Eye Near:    Bilateral Near:  Physical Exam Vitals and nursing note reviewed.  Constitutional:      General: He is not in acute distress.    Appearance: He is not ill-appearing, toxic-appearing or diaphoretic.  HENT:     Head: Normocephalic.     Right Ear: Tympanic membrane, ear canal and external ear normal.     Left Ear: Tympanic membrane, ear canal and external ear normal.     Nose: Rhinorrhea present.     Mouth/Throat:     Mouth: Mucous membranes are moist.     Pharynx: Oropharynx is clear.  Eyes:     General: No scleral icterus.    Conjunctiva/sclera: Conjunctivae normal.  Pulmonary:     Effort: Pulmonary effort is normal.     Breath sounds: Normal breath sounds.  Musculoskeletal:        General: Normal range of motion.     Cervical back: Neck supple.  Skin:    General: Skin is warm and dry.     Findings: No rash.  Neurological:     Mental Status: He is alert and oriented to person, place, and time.     Gait: Gait normal.  Psychiatric:        Mood and Affect: Mood normal.        Behavior: Behavior normal.        Thought Content: Thought content normal.        Judgment: Judgment normal.     UC Treatments / Results  Labs (all labs ordered are listed, but only abnormal results are displayed) Labs Reviewed - No data to display  EKG   Radiology No results found.  Procedures Procedures (including critical care time)  Medications Ordered in UC Medications - No data to display  Initial Impression / Assessment and Plan / UC Course  I have reviewed the triage vital signs and the nursing notes. Has URI and I prescribed him Allegra and Flonase I reviewed his ER notes from 7/21 and his  labs. He requested Toradol shot and due to elevated creatinine I explained this was not safe for him to take. I also explained that other than constipation, Percocet does not cause any type of inflammation or bleed like NASAID's do, so he may take Tylenol if he is out of his percocet. Advised to call the [place] in the am. See instructions.  Final Clinical Impressions(s) / UC Diagnoses   Final diagnoses:  Acute upper respiratory infection     Discharge Instructions      Due to your kidney function being slow, we cant give you Toradol for pain. You also need to avoid any kind of Ibuprofen medication. But your liver function is normal, so you can take Tylenol up to 1000 mg at a time every 8 hours, max 3,000 mg in 24 hours. You were given percocet at the [place] on 7/1 to last you one month, so you need to stay on the regimen your doctor has you on there.      ED Prescriptions     Medication Sig Dispense Auth. Provider   fexofenadine (ALLEGRA ALLERGY) 60 MG tablet Take 1 tablet (60 mg total) by mouth daily. For day time drainage 15 tablet Rodriguez-Southworth, Sunday Spillers, PA-C   fluticasone (FLONASE) 50 MCG/ACT nasal spray Place 2 sprays into both nostrils daily. At night time for stuffy nose 16 g Rodriguez-Southworth, Sunday Spillers, [place]      I have reviewed the PDMP during this encounter.   Shelby Mattocks, Hershal Coria 12/15/20 1136

## 2020-12-15 NOTE — ED Triage Notes (Signed)
Patient c/o sinus congestion and pressure that started on Monday.  Patient c/o chronic back pain. Patient was in the [facility] ED on 12/13/20.

## 2021-01-31 DIAGNOSIS — M438X9 Other specified deforming dorsopathies, site unspecified: Secondary | ICD-10-CM | POA: Insufficient documentation

## 2021-01-31 DIAGNOSIS — F1021 Alcohol dependence, in remission: Secondary | ICD-10-CM | POA: Insufficient documentation

## 2021-01-31 DIAGNOSIS — M419 Scoliosis, unspecified: Secondary | ICD-10-CM | POA: Insufficient documentation

## 2021-01-31 DIAGNOSIS — E785 Hyperlipidemia, unspecified: Secondary | ICD-10-CM | POA: Insufficient documentation

## 2021-01-31 DIAGNOSIS — G4733 Obstructive sleep apnea (adult) (pediatric): Secondary | ICD-10-CM | POA: Insufficient documentation

## 2021-01-31 DIAGNOSIS — F1910 Other psychoactive substance abuse, uncomplicated: Secondary | ICD-10-CM | POA: Insufficient documentation

## 2021-01-31 DIAGNOSIS — F1421 Cocaine dependence, in remission: Secondary | ICD-10-CM | POA: Insufficient documentation

## 2021-02-05 DIAGNOSIS — N1831 Chronic kidney disease, stage 3a: Secondary | ICD-10-CM | POA: Insufficient documentation

## 2021-02-05 DIAGNOSIS — R7303 Prediabetes: Secondary | ICD-10-CM | POA: Insufficient documentation

## 2021-03-14 ENCOUNTER — Other Ambulatory Visit: Payer: Self-pay | Admitting: Neurosurgery

## 2021-03-14 DIAGNOSIS — R600 Localized edema: Secondary | ICD-10-CM

## 2021-03-24 ENCOUNTER — Other Ambulatory Visit: Payer: Self-pay

## 2021-03-24 ENCOUNTER — Ambulatory Visit
Admission: RE | Admit: 2021-03-24 | Discharge: 2021-03-24 | Disposition: A | Discharge status: Home / Self Care | Payer: Medicare (Managed Care) | Source: Ambulatory Visit | Attending: Neurosurgery | Admitting: Neurosurgery

## 2021-03-24 DIAGNOSIS — R600 Localized edema: Secondary | ICD-10-CM | POA: Insufficient documentation

## 2021-03-24 IMAGING — US US EXTREM LOW VENOUS
1 series · 14 of 24 positions shown · non-contrast
Comparison: None.

CLINICAL DATA: Pain and swelling in both legs since surgery last
month

EXAM:
BILATERAL LOWER EXTREMITY VENOUS DOPPLER ULTRASOUND
TECHNIQUE: Gray-scale sonography with compression, as well as color and duplex
ultrasound, were performed to evaluate the deep venous system(s)
from the level of the common femoral vein through the popliteal and
proximal calf veins.

[Series 1: us extrem low venous · 0.06mm/px · 14 of 62 slices shown]
[im 1/62]
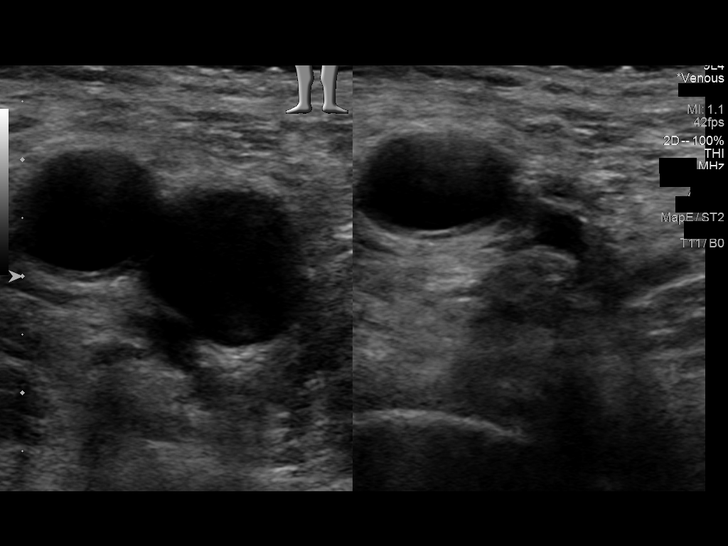
[im 6/62]
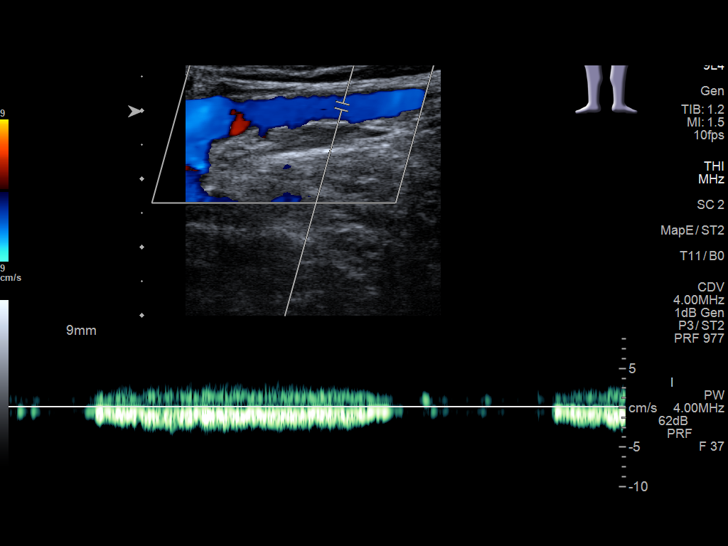
[im 11/62]
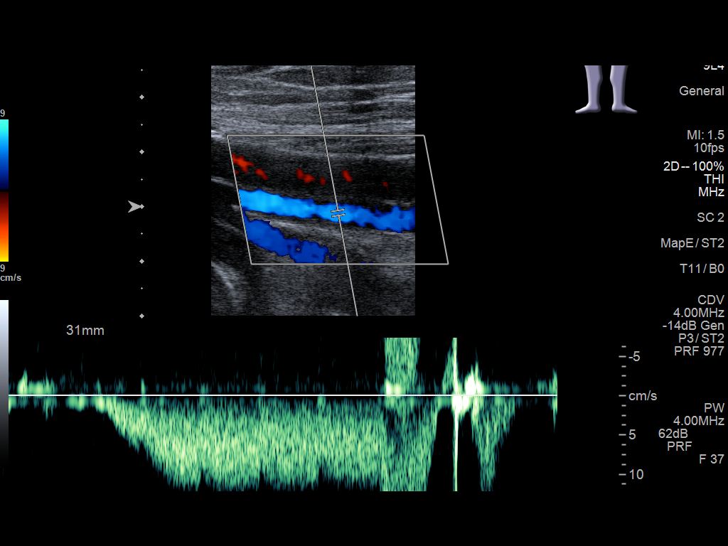
[im 16/62]
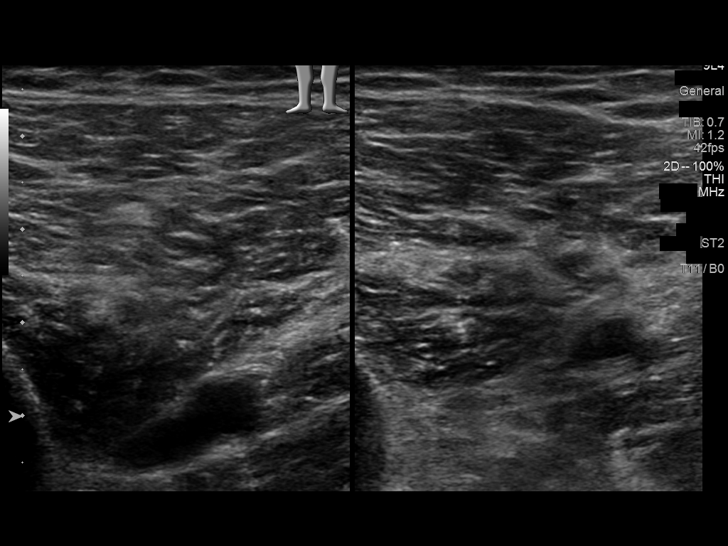
[im 19/62]
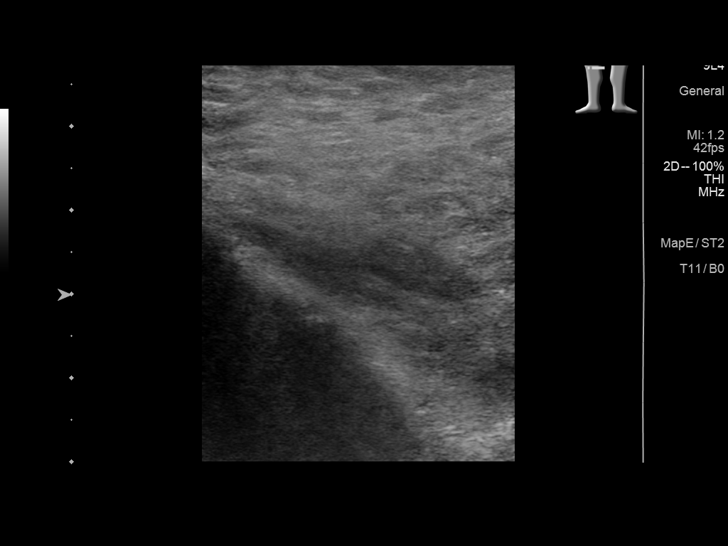
[im 24/62]
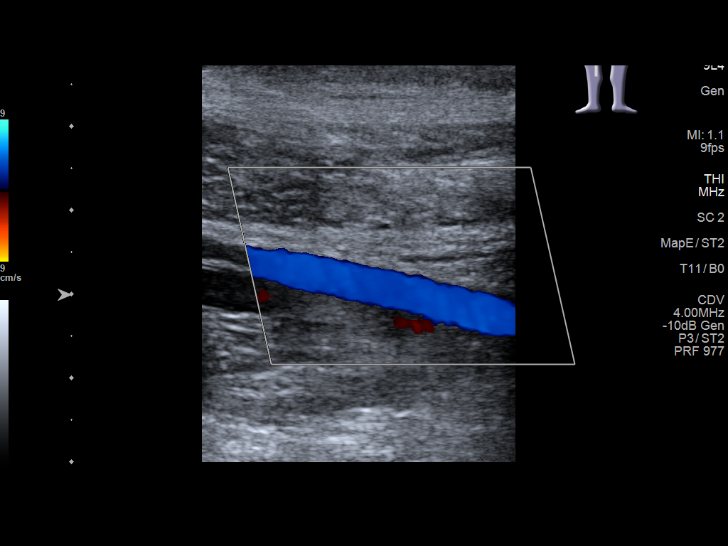
[im 30/62]
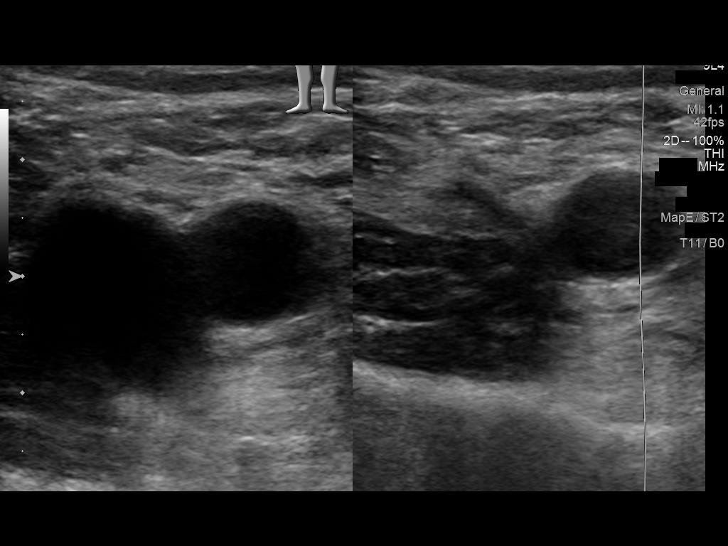
[im 32/62]
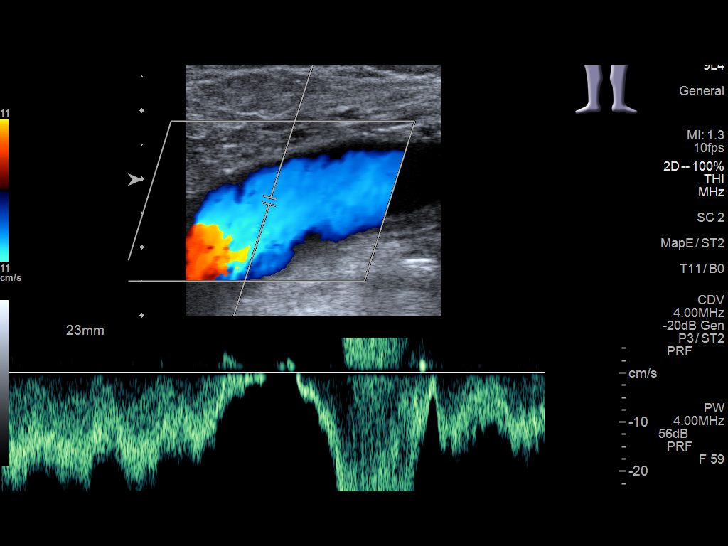
[im 38/62]
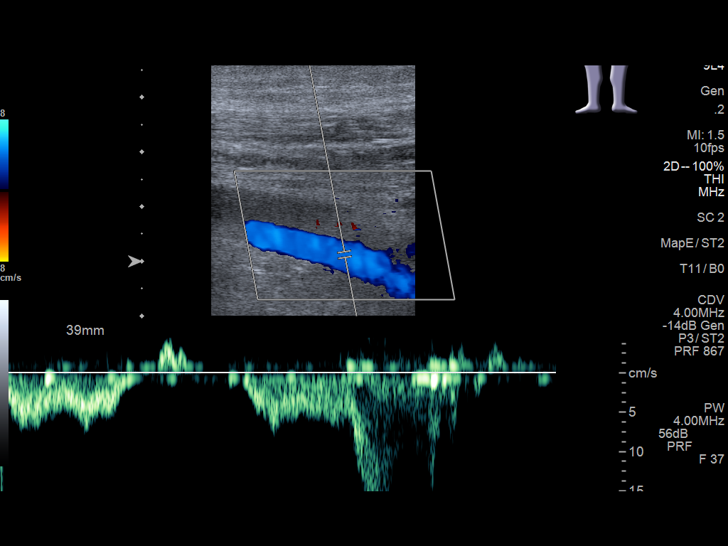
[im 43/62]
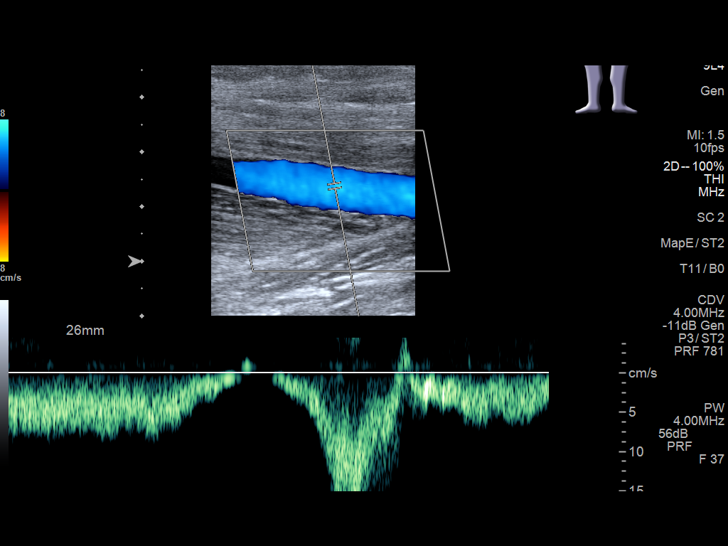
[im 48/62]
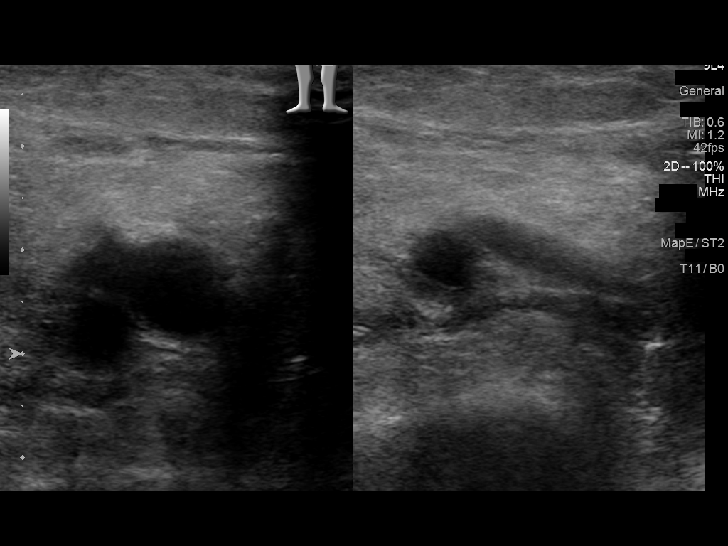
[im 51/62]
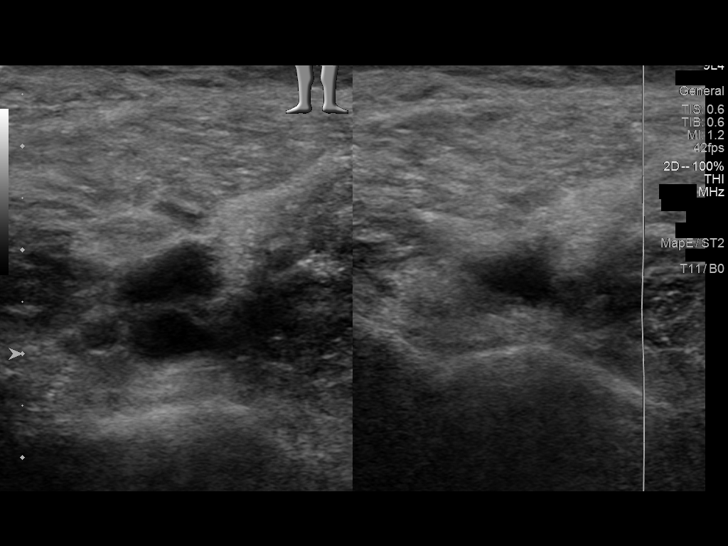
[im 56/62]
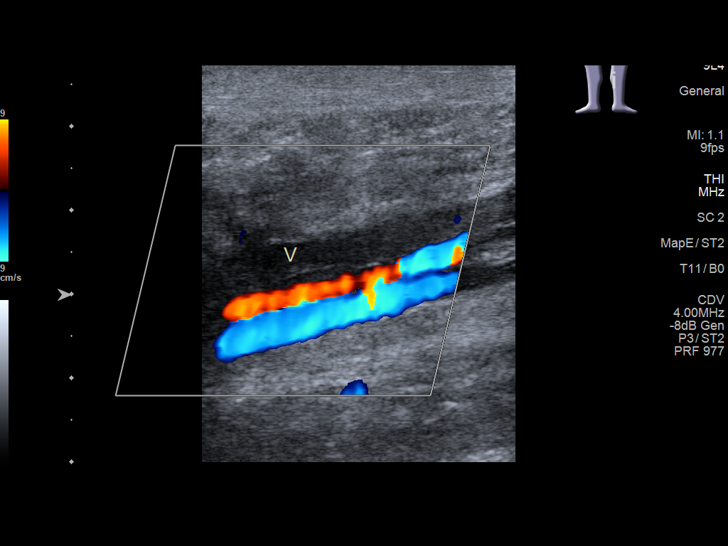
[im 62/62]
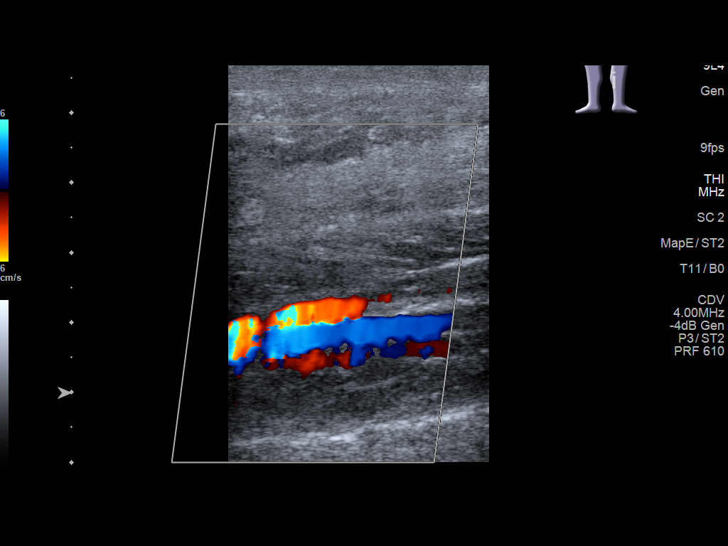

[14 of 24 positions shown; findings below may reference images not displayed]

FINDINGS: VENOUS

Normal compressibility and Doppler flow of the common femoral,
superficial femoral, and popliteal veins on both sides. On the left,
there is a DVT that is occlusive and mildly expansile/recent
appearing at the level of the proximal and mid posterior tibial
vein.

These results will be called to the ordering clinician or
representative by the Radiologist Assistant, and communication
documented in the PACS or [REDACTED].
IMPRESSION: 1. Positive for left posterior tibial vein occlusive thrombosis.
2. No right lower extremity DVT.

## 2021-03-25 ENCOUNTER — Encounter (INDEPENDENT_AMBULATORY_CARE_PROVIDER_SITE_OTHER): Payer: Self-pay | Admitting: Vascular Surgery

## 2021-03-25 ENCOUNTER — Ambulatory Visit (INDEPENDENT_AMBULATORY_CARE_PROVIDER_SITE_OTHER): Payer: Medicare (Managed Care) | Admitting: Vascular Surgery

## 2021-03-25 VITALS — BP 148/75 | HR 87 | Resp 16 | Ht 77.0 in | Wt 193.8 lb

## 2021-03-25 DIAGNOSIS — I82461 Acute embolism and thrombosis of right calf muscular vein: Secondary | ICD-10-CM

## 2021-03-25 DIAGNOSIS — I10 Essential (primary) hypertension: Secondary | ICD-10-CM | POA: Diagnosis not present

## 2021-03-25 DIAGNOSIS — M5137 Other intervertebral disc degeneration, lumbosacral region: Secondary | ICD-10-CM | POA: Insufficient documentation

## 2021-03-25 DIAGNOSIS — J329 Chronic sinusitis, unspecified: Secondary | ICD-10-CM | POA: Insufficient documentation

## 2021-03-25 DIAGNOSIS — M5030 Other cervical disc degeneration, unspecified cervical region: Secondary | ICD-10-CM

## 2021-03-25 DIAGNOSIS — I82409 Acute embolism and thrombosis of unspecified deep veins of unspecified lower extremity: Secondary | ICD-10-CM | POA: Insufficient documentation

## 2021-03-25 DIAGNOSIS — K829 Disease of gallbladder, unspecified: Secondary | ICD-10-CM | POA: Insufficient documentation

## 2021-03-25 DIAGNOSIS — M51379 Other intervertebral disc degeneration, lumbosacral region without mention of lumbar back pain or lower extremity pain: Secondary | ICD-10-CM | POA: Insufficient documentation

## 2021-03-25 DIAGNOSIS — R197 Diarrhea, unspecified: Secondary | ICD-10-CM | POA: Insufficient documentation

## 2021-03-25 DIAGNOSIS — Z800 Family history of malignant neoplasm of digestive organs: Secondary | ICD-10-CM | POA: Insufficient documentation

## 2021-03-25 MED ORDER — APIXABAN 5 MG PO TABS: 5.0000 mg | ORAL_TABLET | Freq: Two times a day (BID) | ORAL | 2 refills | Status: DC

## 2021-03-25 NOTE — Assessment & Plan Note (Signed)
blood pressure control important in reducing the progression of atherosclerotic disease. On appropriate oral medications.  

## 2021-03-25 NOTE — Assessment & Plan Note (Signed)
The patient has an acute DVT of the left posterior tibial vein.  He is far enough out from surgery that anticoagulation is reasonable.  This is not a proximal DVT and there is no role for thrombectomy or filter placement at this time.  Given how symptomatic he is I would recommend full anticoagulation and not just aspirin even though it is a calf vein DVT.  5 mg of Eliquis twice daily to be prescribed today and we will continue this for at least 1 month when I will repeat an ultrasound for further evaluation.

## 2021-03-25 NOTE — Progress Notes (Signed)
Patient ID: Earl Murray, male   DOB: Jul 24, 1955, 65 y.o.   MRN: [id number]  Chief Complaint  Patient presents with   New Patient (Initial Visit)    Ref [facility] lleptv dvt    HPI Earl Murray is a 65 y.o. male.  I am asked to see the patient by Dr. Izora Ribas for evaluation of left leg pain and swelling with a diagnosis of DVT on an ultrasound performed yesterday.  I independently reviewed this ultrasound.  He has left posterior tibial vein DVT.  His pain and swelling started shortly after his back surgery which was about a month ago.  He does not have any chest pain or shortness of breath.  This has been largely in his calf and lower leg.  No progression up the leg.  No right leg symptoms.  No previous history of DVT or thrombotic issues.  No family history of thrombotic issues to his knowledge.     Past Medical History:  Diagnosis Date   Hypertension    Neck injury     Past Surgical History:  Procedure Laterality Date   BACK SURGERY     CHOLECYSTECTOMY     ELBOW SURGERY     ERCP N/A 10/27/2019   Procedure: ENDOSCOPIC RETROGRADE CHOLANGIOPANCREATOGRAPHY (ERCP);  Surgeon: Lucilla Lame, MD;  Location: [facility] ENDOSCOPY;  Service: Endoscopy;  Laterality: N/A;   FOOT SURGERY     HERNIA REPAIR     4 hernia repairs     Family History  Problem Relation Age of Onset   Hypertension Mother    Heart disease Mother    Colon cancer Father   No bleeding or clotting   Social History   Tobacco Use   Smoking status: Former    Types: Cigarettes    Quit date: 05/2018    Years since quitting: 2.8   Smokeless tobacco: Former  Scientific laboratory technician Use: Never used  Substance Use Topics   Alcohol use: Not Currently   Drug use: Not Currently     Allergies  Allergen Reactions   Duloxetine Other (See Comments)   Lisinopril Cough   Tylenol With Codeine #3 [Acetaminophen-Codeine] Itching   Motrin [Ibuprofen] Rash   Tramadol Rash    Current Outpatient Medications  Medication Sig  Dispense Refill   amLODipine (NORVASC) 10 MG tablet Take 10 mg by mouth daily.      atorvastatin (LIPITOR) 20 MG tablet TAKE ONE-HALF TABLET BY MOUTH AT BEDTIME FOR CHOLESTEROL     chlorthalidone (HYGROTON) 25 MG tablet TAKE ONE TABLET BY MOUTH EVERY DAY FOR BLOOD PRESSURE INSTEAD OF HYDROCHLOROTHIAZIDE     hydrochlorothiazide (HYDRODIURIL) 25 MG tablet Take 1 tablet (25 mg total) by mouth daily. 90 tablet 1   mirtazapine (REMERON) 7.5 MG tablet      Oxycodone HCl 10 MG TABS Take by mouth.     pantoprazole (PROTONIX) 40 MG tablet TAKE ONE TABLET BY MOUTH EVERY DAY TO CONTROL STOMACH ACID TAKE 30 MINUTES BEFORE THE LARGES MEAL OF THE DAY     cyclobenzaprine (FLEXERIL) 10 MG tablet Take by mouth. (Patient not taking: Reported on 03/25/2021)     dicyclomine (BENTYL) 10 MG capsule Take 1 capsule (10 mg total) by mouth 3 (three) times daily as needed for up to 14 days for spasms. or abdominal pain 30 capsule 0   fexofenadine (ALLEGRA ALLERGY) 60 MG tablet Take 1 tablet (60 mg total) by mouth daily. For day time drainage (Patient not taking: Reported on 03/25/2021) 15  tablet 0   fluticasone (FLONASE) 50 MCG/ACT nasal spray Place 2 sprays into both nostrils daily. At night time for stuffy nose (Patient not taking: Reported on 03/25/2021) 16 g 0   HYDROcodone-acetaminophen (NORCO/VICODIN) 5-325 MG tablet Take 1-2 tablets by mouth every 6 (six) hours as needed for moderate pain or severe pain. (Patient not taking: Reported on 03/25/2021) 12 tablet 0   omeprazole (PRILOSEC OTC) 20 MG tablet Take 1 tablet (20 mg total) by mouth daily. (Patient not taking: Reported on 03/25/2021) 28 tablet 1   sucralfate (CARAFATE) 1 g tablet Take 1 tablet (1 g total) by mouth 4 (four) times daily as needed (for abdominal discomfort, nausea, and/or vomiting). (Patient not taking: Reported on 03/25/2021) 30 tablet 1   venlafaxine XR (EFFEXOR-XR) 37.5 MG 24 hr capsule TAKE TWO CAPSULES BY MOUTH EVERY DAY FOR 7 DAYS, THEN TAKE ONE CAPSULE  EVERY DAY FOR 7 DAYS (Patient not taking: Reported on 03/25/2021)     No current facility-administered medications for this visit.      REVIEW OF SYSTEMS (Negative unless checked)  Constitutional: [] Weight loss  [] Fever  [] Chills Cardiac: [] Chest pain   [] Chest pressure   [] Palpitations   [] Shortness of breath when laying flat   [] Shortness of breath at rest   [] Shortness of breath with exertion. Vascular:  [] Pain in legs with walking   [] Pain in legs at rest   [] Pain in legs when laying flat   [] Claudication   [] Pain in feet when walking  [] Pain in feet at rest  [] Pain in feet when laying flat   [x] History of DVT   [x] Phlebitis   [x] Swelling in legs   [] Varicose veins   [] Non-healing ulcers Pulmonary:   [] Uses home oxygen   [] Productive cough   [] Hemoptysis   [] Wheeze  [] COPD   [] Asthma Neurologic:  [] Dizziness  [] Blackouts   [] Seizures   [] History of stroke   [] History of TIA  [] Aphasia   [] Temporary blindness   [] Dysphagia   [] Weakness or numbness in arms   [] Weakness or numbness in legs Musculoskeletal:  [x] Arthritis   [] Joint swelling   [] Joint pain   [x] Low back pain Hematologic:  [] Easy bruising  [] Easy bleeding   [] Hypercoagulable state   [] Anemic  [] Hepatitis Gastrointestinal:  [] Blood in stool   [] Vomiting blood  [] Gastroesophageal reflux/heartburn   [] Abdominal pain Genitourinary:  [] Chronic kidney disease   [] Difficult urination  [] Frequent urination  [] Burning with urination   [] Hematuria Skin:  [] Rashes   [] Ulcers   [] Wounds Psychological:  [] History of anxiety   []  History of major depression.    Physical Exam BP (!) 148/75 (BP Location: Left Arm)   Pulse 87   Resp 16   Ht 6\' 5"  (1.956 m)   Wt 193 lb 12.8 oz (87.9 kg)   BMI 22.98 kg/m  Gen:  WD/WN, NAD.  In a large back brace after his surgery Head: [place]/AT, No temporalis wasting.  Ear/Nose/Throat: Hearing grossly intact, nares w/o erythema or drainage, oropharynx w/o Erythema/Exudate Eyes: Conjunctiva clear, sclera  non-icteric  Neck: trachea midline.  No JVD.  Pulmonary:  Good air movement, respirations not labored Cardiac: RRR, no JVD Vascular:  Vessel Right Left  Radial Palpable Palpable                                   Gastrointestinal:. No masses, surgical incisions, or scars. Musculoskeletal: M/S 5/5 throughout.  Extremities without ischemic changes.  No deformity or atrophy.  No significant right lower extremity edema, 1-2+ left lower extremity edema.  Tender to touch. Neurologic: Sensation grossly intact in extremities.  Symmetrical.  Speech is fluent. Motor exam as listed above. Psychiatric: Judgment intact, Mood & affect appropriate for pt's clinical situation. Dermatologic: No rashes or ulcers noted.  No cellulitis or open wounds.    Radiology US Venous Img Lower Bilateral (DVT)  Result Date: 03/24/2021 CLINICAL DATA:  Pain and swelling in both legs since surgery last month EXAM: BILATERAL LOWER EXTREMITY VENOUS DOPPLER ULTRASOUND TECHNIQUE: Gray-scale sonography with compression, as well as color and duplex ultrasound, were performed to evaluate the deep venous system(s) from the level of the common femoral vein through the popliteal and proximal calf veins. COMPARISON:  None. FINDINGS: VENOUS Normal compressibility and Doppler flow of the common femoral, superficial femoral, and popliteal veins on both sides. On the left, there is a DVT that is occlusive and mildly expansile/recent appearing at the level of the proximal and mid posterior tibial vein. These results will be called to the ordering clinician or representative by the Radiologist Assistant, and communication documented in the PACS or [employer]. IMPRESSION: 1. Positive for left posterior tibial vein occlusive thrombosis. 2. No right lower extremity DVT. Electronically Signed   By: Jorje Guild M.D.   On: 03/24/2021 10:29    Labs No results found for this or any previous visit (from the past 2160  hour(s)).  Assessment/Plan:  DVT (deep venous thrombosis) (HCC) The patient has an acute DVT of the left posterior tibial vein.  He is far enough out from surgery that anticoagulation is reasonable.  This is not a proximal DVT and there is no role for thrombectomy or filter placement at this time.  Given how symptomatic he is I would recommend full anticoagulation and not just aspirin even though it is a calf vein DVT.  5 mg of Eliquis twice daily to be prescribed today and we will continue this for at least 1 month when I will repeat an ultrasound for further evaluation.  Hypertension blood pressure control important in reducing the progression of atherosclerotic disease. On appropriate oral medications.   DDD (degenerative disc disease), cervical Has undergone back surgery and seems to be recovering well from this.      Leotis Pain 03/25/2021, 12:02 PM   This note was created with Dragon medical transcription system.  Any errors from dictation are unintentional.

## 2021-03-25 NOTE — Assessment & Plan Note (Signed)
Has undergone back surgery and seems to be recovering well from this.

## 2021-03-28 ENCOUNTER — Encounter (INDEPENDENT_AMBULATORY_CARE_PROVIDER_SITE_OTHER): Payer: Medicare (Managed Care) | Admitting: Nurse Practitioner

## 2021-03-31 ENCOUNTER — Telehealth (INDEPENDENT_AMBULATORY_CARE_PROVIDER_SITE_OTHER): Payer: Self-pay

## 2021-03-31 NOTE — Telephone Encounter (Signed)
Yes the patient has a DVT and swelling is common.  As he was told, he should take ELIQUIS twice a day and we will re-evaluate in a month.  He should elevate his leg to help with swelling.  He can wear compression socks in a couple of weeks.  However the decrease in swelling will take time and patience

## 2021-03-31 NOTE — Telephone Encounter (Signed)
The pt called and left a VM on the nurses line saying he is having swelling in his Lt LE an would like to know is it normal. The pt saw Dr. Lucky Cowboy on Nov 1st for a DVT an ultrasound and was told to take Plavix 2 times a day a will re-evaluate in a month. Please advise.

## 2021-03-31 NOTE — Telephone Encounter (Signed)
I called the pt and made him aware of the NP's instructions as well assured he is taking Eliquis twice  a day. Pt voiced understanding.

## 2021-04-25 ENCOUNTER — Other Ambulatory Visit: Payer: Self-pay

## 2021-04-25 ENCOUNTER — Ambulatory Visit (INDEPENDENT_AMBULATORY_CARE_PROVIDER_SITE_OTHER): Payer: Medicare (Managed Care) | Admitting: Vascular Surgery

## 2021-04-25 ENCOUNTER — Ambulatory Visit (INDEPENDENT_AMBULATORY_CARE_PROVIDER_SITE_OTHER): Payer: Medicare (Managed Care)

## 2021-04-25 VITALS — BP 142/75 | HR 75 | Ht 77.0 in | Wt 200.0 lb

## 2021-04-25 DIAGNOSIS — M7710 Lateral epicondylitis, unspecified elbow: Secondary | ICD-10-CM | POA: Insufficient documentation

## 2021-04-25 DIAGNOSIS — R202 Paresthesia of skin: Secondary | ICD-10-CM | POA: Insufficient documentation

## 2021-04-25 DIAGNOSIS — M542 Cervicalgia: Secondary | ICD-10-CM | POA: Insufficient documentation

## 2021-04-25 DIAGNOSIS — F1121 Opioid dependence, in remission: Secondary | ICD-10-CM | POA: Insufficient documentation

## 2021-04-25 DIAGNOSIS — I82461 Acute embolism and thrombosis of right calf muscular vein: Secondary | ICD-10-CM

## 2021-04-25 DIAGNOSIS — G43909 Migraine, unspecified, not intractable, without status migrainosus: Secondary | ICD-10-CM | POA: Insufficient documentation

## 2021-04-25 DIAGNOSIS — D3613 Benign neoplasm of peripheral nerves and autonomic nervous system of lower limb, including hip: Secondary | ICD-10-CM | POA: Insufficient documentation

## 2021-04-25 DIAGNOSIS — R109 Unspecified abdominal pain: Secondary | ICD-10-CM | POA: Insufficient documentation

## 2021-04-25 DIAGNOSIS — R52 Pain, unspecified: Secondary | ICD-10-CM | POA: Insufficient documentation

## 2021-04-25 DIAGNOSIS — K859 Acute pancreatitis without necrosis or infection, unspecified: Secondary | ICD-10-CM | POA: Insufficient documentation

## 2021-04-25 DIAGNOSIS — M5430 Sciatica, unspecified side: Secondary | ICD-10-CM | POA: Insufficient documentation

## 2021-04-25 DIAGNOSIS — S92309A Fracture of unspecified metatarsal bone(s), unspecified foot, initial encounter for closed fracture: Secondary | ICD-10-CM | POA: Insufficient documentation

## 2021-04-25 DIAGNOSIS — Z720 Tobacco use: Secondary | ICD-10-CM | POA: Insufficient documentation

## 2021-04-25 DIAGNOSIS — I82462 Acute embolism and thrombosis of left calf muscular vein: Secondary | ICD-10-CM

## 2021-04-25 DIAGNOSIS — R310 Gross hematuria: Secondary | ICD-10-CM | POA: Insufficient documentation

## 2021-04-25 DIAGNOSIS — F339 Major depressive disorder, recurrent, unspecified: Secondary | ICD-10-CM | POA: Insufficient documentation

## 2021-04-25 DIAGNOSIS — J309 Allergic rhinitis, unspecified: Secondary | ICD-10-CM | POA: Insufficient documentation

## 2021-04-25 DIAGNOSIS — M25569 Pain in unspecified knee: Secondary | ICD-10-CM | POA: Insufficient documentation

## 2021-04-25 DIAGNOSIS — R1084 Generalized abdominal pain: Secondary | ICD-10-CM | POA: Insufficient documentation

## 2021-04-25 DIAGNOSIS — C679 Malignant neoplasm of bladder, unspecified: Secondary | ICD-10-CM | POA: Insufficient documentation

## 2021-04-25 DIAGNOSIS — R55 Syncope and collapse: Secondary | ICD-10-CM | POA: Insufficient documentation

## 2021-04-25 DIAGNOSIS — Z122 Encounter for screening for malignant neoplasm of respiratory organs: Secondary | ICD-10-CM | POA: Insufficient documentation

## 2021-04-25 DIAGNOSIS — G4752 REM sleep behavior disorder: Secondary | ICD-10-CM | POA: Insufficient documentation

## 2021-04-25 NOTE — Assessment & Plan Note (Signed)
Duplex today shows resolution of his left calf vein DVT.  He has done well.  The swelling is down.  He still does have pain in the lower leg likely from some venous hypertension after his DVT.  At this point, it is okay to stop his anticoagulation and resume all normal activity.  I recommended a compression stocking elevation to try to reduce postphlebitic symptoms.  I will see him back as needed

## 2021-04-25 NOTE — Progress Notes (Signed)
MRN : [id number]  Earl Murray is a 65 y.o. (04/26/1956) male who presents with chief complaint of  Chief Complaint  Patient presents with   Follow-up    1 Mo DVT  .  History of Present Illness: Patient returns today in follow up of his calf DVT.  He developed this after back surgery.  He has been on anticoagulation now for a little over a month.  His swelling is gone down.  He still has some pain in the lower part of the leg but it is significantly improved. Duplex today shows resolution of his left calf vein DVT.  Current Outpatient Medications  Medication Sig Dispense Refill   amLODipine (NORVASC) 10 MG tablet Take 10 mg by mouth daily.      apixaban (ELIQUIS) 5 MG TABS tablet Take 1 tablet (5 mg total) by mouth 2 (two) times daily. 60 tablet 2   atorvastatin (LIPITOR) 20 MG tablet TAKE ONE-HALF TABLET BY MOUTH AT BEDTIME FOR CHOLESTEROL     chlorthalidone (HYGROTON) 25 MG tablet TAKE ONE TABLET BY MOUTH EVERY DAY FOR BLOOD PRESSURE INSTEAD OF HYDROCHLOROTHIAZIDE     cyclobenzaprine (FLEXERIL) 10 MG tablet Take by mouth.     fexofenadine (ALLEGRA ALLERGY) 60 MG tablet Take 1 tablet (60 mg total) by mouth daily. For day time drainage 15 tablet 0   fluticasone (FLONASE) 50 MCG/ACT nasal spray Place 2 sprays into both nostrils daily. At night time for stuffy nose 16 g 0   hydrochlorothiazide (HYDRODIURIL) 25 MG tablet Take 1 tablet (25 mg total) by mouth daily. 90 tablet 1   HYDROcodone-acetaminophen (NORCO/VICODIN) 5-325 MG tablet Take 1-2 tablets by mouth every 6 (six) hours as needed for moderate pain or severe pain. 12 tablet 0   mirtazapine (REMERON) 7.5 MG tablet      omeprazole (PRILOSEC OTC) 20 MG tablet Take 1 tablet (20 mg total) by mouth daily. 28 tablet 1   Oxycodone HCl 10 MG TABS Take by mouth.     pantoprazole (PROTONIX) 40 MG tablet TAKE ONE TABLET BY MOUTH EVERY DAY TO CONTROL STOMACH ACID TAKE 30 MINUTES BEFORE THE LARGES MEAL OF THE DAY     sucralfate (CARAFATE) 1 g  tablet Take 1 tablet (1 g total) by mouth 4 (four) times daily as needed (for abdominal discomfort, nausea, and/or vomiting). 30 tablet 1   venlafaxine XR (EFFEXOR-XR) 37.5 MG 24 hr capsule      dicyclomine (BENTYL) 10 MG capsule Take 1 capsule (10 mg total) by mouth 3 (three) times daily as needed for up to 14 days for spasms. or abdominal pain 30 capsule 0   No current facility-administered medications for this visit.    Past Medical History:  Diagnosis Date   Hypertension    Neck injury     Past Surgical History:  Procedure Laterality Date   BACK SURGERY     CHOLECYSTECTOMY     ELBOW SURGERY     ERCP N/A 10/27/2019   Procedure: ENDOSCOPIC RETROGRADE CHOLANGIOPANCREATOGRAPHY (ERCP);  Surgeon: Lucilla Lame, MD;  Location: [facility] ENDOSCOPY;  Service: Endoscopy;  Laterality: N/A;   FOOT SURGERY     HERNIA REPAIR     4 hernia repairs     Social History   Tobacco Use   Smoking status: Former    Types: Cigarettes    Quit date: 05/2018    Years since quitting: 2.9   Smokeless tobacco: Former  Scientific laboratory technician Use: Never used  Substance Use Topics  Alcohol use: Not Currently   Drug use: Not Currently      Family History  Problem Relation Age of Onset   Hypertension Mother    Heart disease Mother    Colon cancer Father      Allergies  Allergen Reactions   Duloxetine Other (See Comments)   Lisinopril Cough   Tylenol With Codeine #3 [Acetaminophen-Codeine] Itching   Motrin [Ibuprofen] Rash   Tramadol Rash      REVIEW OF SYSTEMS (Negative unless checked)   Constitutional: [] Weight loss  [] Fever  [] Chills Cardiac: [] Chest pain   [] Chest pressure   [] Palpitations   [] Shortness of breath when laying flat   [] Shortness of breath at rest   [] Shortness of breath with exertion. Vascular:  [] Pain in legs with walking   [] Pain in legs at rest   [] Pain in legs when laying flat   [] Claudication   [] Pain in feet when walking  [] Pain in feet at rest  [] Pain in feet when  laying flat   [x] History of DVT   [x] Phlebitis   [x] Swelling in legs   [] Varicose veins   [] Non-healing ulcers Pulmonary:   [] Uses home oxygen   [] Productive cough   [] Hemoptysis   [] Wheeze  [] COPD   [] Asthma Neurologic:  [] Dizziness  [] Blackouts   [] Seizures   [] History of stroke   [] History of TIA  [] Aphasia   [] Temporary blindness   [] Dysphagia   [] Weakness or numbness in arms   [] Weakness or numbness in legs Musculoskeletal:  [x] Arthritis   [] Joint swelling   [] Joint pain   [x] Low back pain Hematologic:  [] Easy bruising  [] Easy bleeding   [] Hypercoagulable state   [] Anemic  [] Hepatitis Gastrointestinal:  [] Blood in stool   [] Vomiting blood  [] Gastroesophageal reflux/heartburn   [] Abdominal pain Genitourinary:  [] Chronic kidney disease   [] Difficult urination  [] Frequent urination  [] Burning with urination   [] Hematuria Skin:  [] Rashes   [] Ulcers   [] Wounds Psychological:  [] History of anxiety   []  History of major depression.  Physical Examination  BP (!) 142/75   Pulse 75   Ht 6\' 5"  (1.956 m)   Wt 200 lb (90.7 kg)   BMI 23.72 kg/m  Gen:  WD/WN, NAD Head: /AT, No temporalis wasting. Ear/Nose/Throat: Hearing grossly intact, nares w/o erythema or drainage Eyes: Conjunctiva clear. Sclera non-icteric Neck: Supple.  Trachea midline Pulmonary:  Good air movement, no use of accessory muscles.  Cardiac: RRR, no JVD Vascular:  Vessel Right Left  Radial Palpable Palpable                          PT Palpable Palpable  DP Palpable Palpable   Gastrointestinal: soft, non-tender/non-distended. No guarding/reflex.  Musculoskeletal: M/S 5/5 throughout.  No deformity or atrophy.  No significant lower extremity edema. Neurologic: Sensation grossly intact in extremities.  Symmetrical.  Speech is fluent.  Psychiatric: Judgment intact, Mood & affect appropriate for pt's clinical situation. Dermatologic: No rashes or ulcers noted.  No cellulitis or open wounds.      Labs No results  found for this or any previous visit (from the past 2160 hour(s)).  Radiology No results found.  Assessment/Plan Hypertension blood pressure control important in reducing the progression of atherosclerotic disease. On appropriate oral medications.     DDD (degenerative disc disease), cervical Has undergone back surgery and seems to be recovering well from this.  DVT (deep venous thrombosis) (HCC) Duplex today shows resolution of his left calf vein DVT.  He has done well.  The swelling is down.  He still does have pain in the lower leg likely from some venous hypertension after his DVT.  At this point, it is okay to stop his anticoagulation and resume all normal activity.  I recommended a compression stocking elevation to try to reduce postphlebitic symptoms.  I will see him back as needed    Leotis Pain, MD  04/25/2021 10:08 AM    This note was created with Dragon medical transcription system.  Any errors from dictation are purely unintentional

## 2021-07-10 ENCOUNTER — Emergency Department
Admission: EM | Admit: 2021-07-10 | Discharge: 2021-07-10 | Disposition: A | Discharge status: Home / Self Care | Payer: Medicare Other | Attending: Emergency Medicine | Admitting: Emergency Medicine

## 2021-07-10 ENCOUNTER — Other Ambulatory Visit: Payer: Self-pay

## 2021-07-10 ENCOUNTER — Encounter: Payer: Self-pay | Admitting: Emergency Medicine

## 2021-07-10 DIAGNOSIS — Z5321 Procedure and treatment not carried out due to patient leaving prior to being seen by health care provider: Secondary | ICD-10-CM | POA: Diagnosis not present

## 2021-07-10 DIAGNOSIS — R109 Unspecified abdominal pain: Secondary | ICD-10-CM | POA: Insufficient documentation

## 2021-07-10 DIAGNOSIS — R197 Diarrhea, unspecified: Secondary | ICD-10-CM | POA: Diagnosis not present

## 2021-07-10 LAB — COMPREHENSIVE METABOLIC PANEL
ALT: 26 U/L (ref 0–44)
AST: 25 U/L (ref 15–41)
Albumin: 4.4 g/dL (ref 3.5–5.0)
Alkaline Phosphatase: 120 U/L (ref 38–126)
Anion gap: 13 (ref 5–15)
BUN: 20 mg/dL (ref 8–23)
CO2: 24 mmol/L (ref 22–32)
Calcium: 9.8 mg/dL (ref 8.9–10.3)
Chloride: 99 mmol/L (ref 98–111)
Creatinine, Ser: 1.34 mg/dL — ABNORMAL HIGH (ref 0.61–1.24)
GFR, Estimated: 58 mL/min — ABNORMAL LOW (ref >60)
Glucose, Bld: 105 mg/dL — ABNORMAL HIGH (ref 70–99)
Potassium: 3.3 mmol/L — ABNORMAL LOW (ref 3.5–5.1)
Sodium: 136 mmol/L (ref 135–145)
Total Bilirubin: 0.6 mg/dL (ref 0.3–1.2)
Total Protein: 8.5 g/dL — ABNORMAL HIGH (ref 6.5–8.1)

## 2021-07-10 LAB — CBC
HCT: 46.6 % (ref 39.0–52.0)
Hemoglobin: 15.3 g/dL (ref 13.0–17.0)
MCH: 27.8 pg (ref 26.0–34.0)
MCHC: 32.8 g/dL (ref 30.0–36.0)
MCV: 84.6 fL (ref 80.0–100.0)
Platelets: 266 10*3/uL (ref 150–400)
RBC: 5.51 MIL/uL (ref 4.22–5.81)
RDW: 17.5 % — ABNORMAL HIGH (ref 11.5–15.5)
WBC: 14.2 10*3/uL — ABNORMAL HIGH (ref 4.0–10.5)
nRBC: 0 % (ref 0.0–0.2)

## 2021-07-10 LAB — LIPASE, BLOOD: Lipase: 30 U/L (ref 11–51)

## 2021-07-10 NOTE — ED Triage Notes (Signed)
Pt comes with c/o abdominal pain and diarrhea for few days. Pt states it is sharp and constant. Pt states no relief when changing positions.  165/98 86 100 mic fent 20 g in right AC.

## 2021-07-10 NOTE — ED Notes (Signed)
20GA SL removed from rt a/c; cannula intact, dsng applied

## 2021-07-16 ENCOUNTER — Emergency Department
Admission: EM | Admit: 2021-07-16 | Discharge: 2021-07-16 | Disposition: A | Discharge status: Home / Self Care | Payer: Medicare Other | Attending: Emergency Medicine | Admitting: Emergency Medicine

## 2021-07-16 ENCOUNTER — Emergency Department: Payer: Medicare Other

## 2021-07-16 ENCOUNTER — Other Ambulatory Visit: Payer: Self-pay

## 2021-07-16 DIAGNOSIS — S32040A Wedge compression fracture of fourth lumbar vertebra, initial encounter for closed fracture: Secondary | ICD-10-CM | POA: Insufficient documentation

## 2021-07-16 DIAGNOSIS — W109XXA Fall (on) (from) unspecified stairs and steps, initial encounter: Secondary | ICD-10-CM | POA: Insufficient documentation

## 2021-07-16 DIAGNOSIS — W19XXXA Unspecified fall, initial encounter: Secondary | ICD-10-CM

## 2021-07-16 DIAGNOSIS — M545 Low back pain, unspecified: Secondary | ICD-10-CM

## 2021-07-16 DIAGNOSIS — S3992XA Unspecified injury of lower back, initial encounter: Secondary | ICD-10-CM | POA: Diagnosis present

## 2021-07-16 IMAGING — CR DG LUMBAR SPINE 2-3V
1 series · 2 of 2 positions shown · non-contrast
Comparison: None.

CLINICAL DATA: CT [DATE].

EXAM:
LUMBAR SPINE - 2-3 VIEW

[Series 1: dg lumbar spine 2-3 views · 0.14mm/px · 2 of 2 slices shown]
[im 1/2]
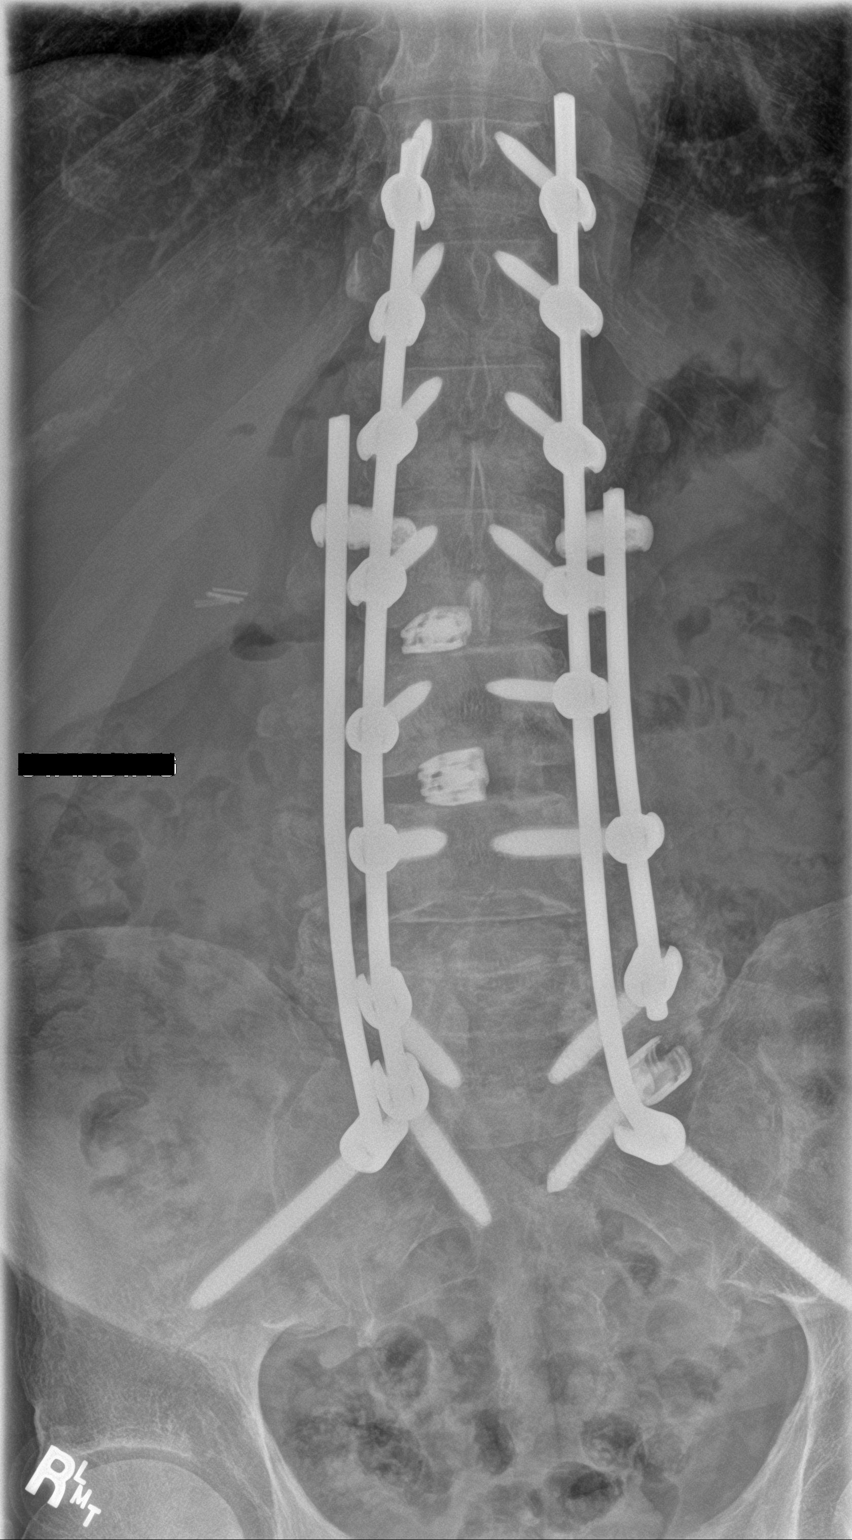
[im 2/2]
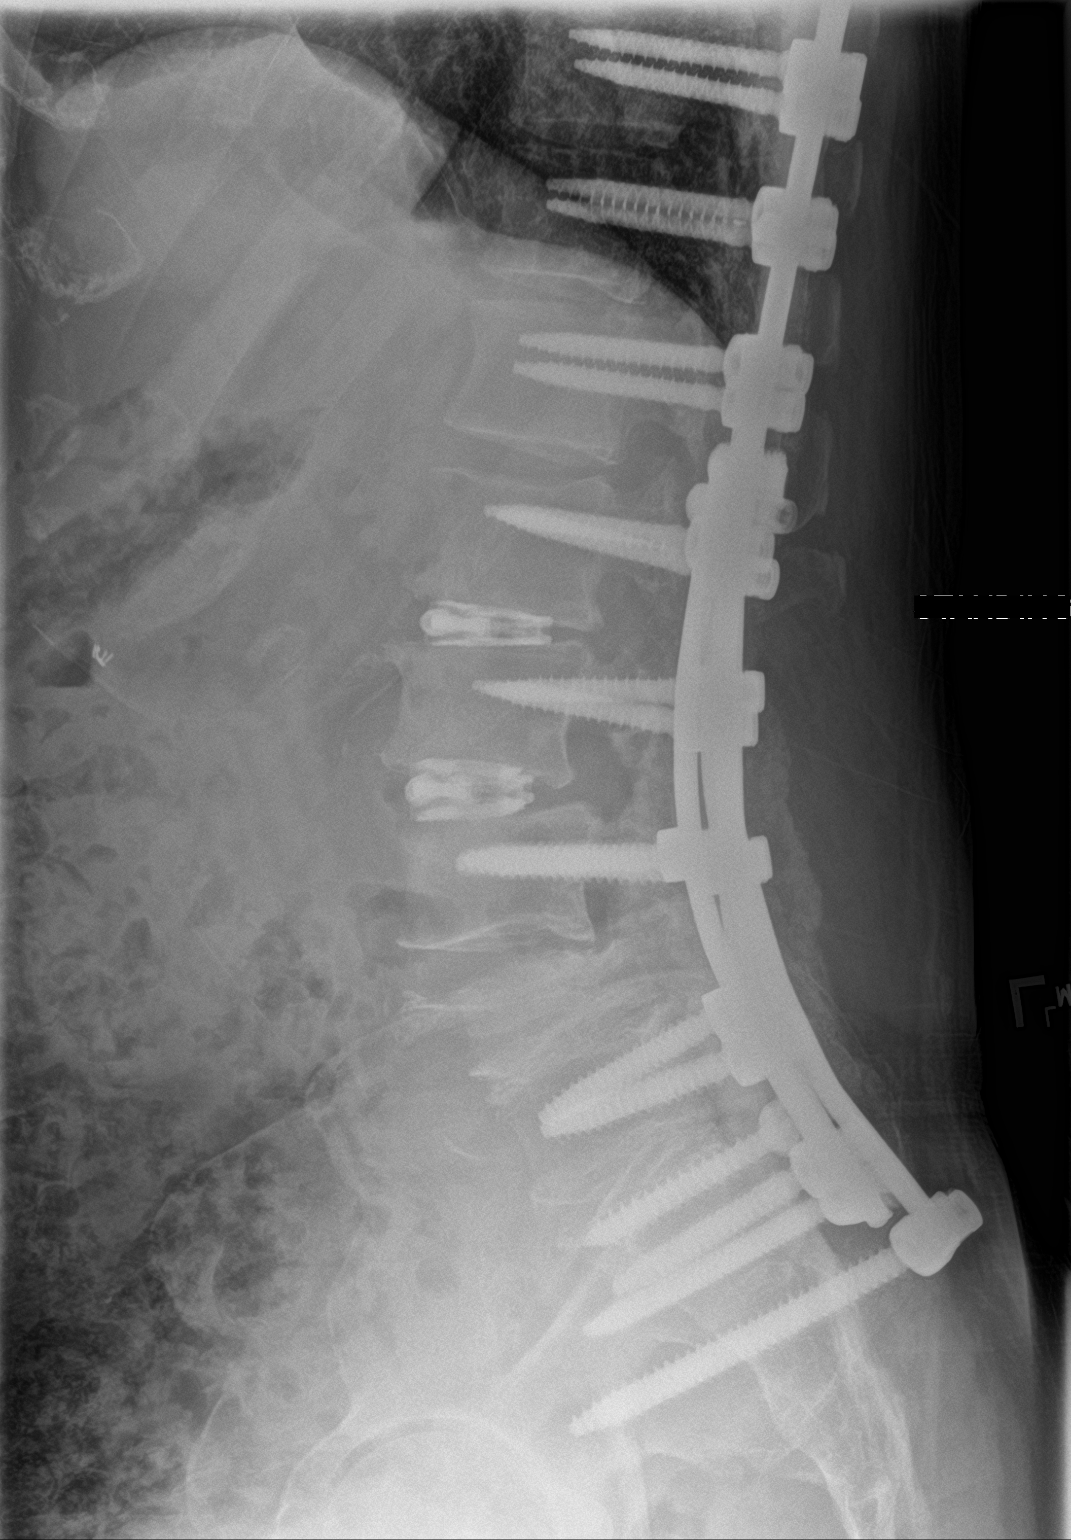

[2 of 2 positions shown; findings below may reference images not displayed]

FINDINGS: Posterior fusion spanning from the imaged thoracic spine through the
sacrum. Interbody spacers at L1-L2 and L2-L3. New/interval height
loss and superior endplate sclerosis at L4, compatible with fracture
that may be acute.New grade 1 anterolisthesis of L3 on L4.
Multilevel degenerative change, including disc height loss and facet
arthropathy.
IMPRESSION: 1. New/interval height loss and superior endplate sclerosis at L4,
compatible with fracture that may be acute.
2. New grade 1 anterolisthesis of L3 on L4. Traumatic
anterolisthesis is not excluded.
3. Findings not well evaluated by radiograph and recommend
cross-sectional imaging to further evaluate.

Findings and recommendations discussed with Dr. TIGER via
telephone at [DATE]

## 2021-07-16 MED ORDER — HYDROMORPHONE HCL 1 MG/ML IJ SOLN
0.5000 mg | Freq: Once | INTRAMUSCULAR | Status: AC
  Administered 2021-07-16: 0.5 mg via INTRAMUSCULAR
  Filled 2021-07-16: qty 1

## 2021-07-16 MED ORDER — HYDROCODONE-ACETAMINOPHEN 5-325 MG PO TABS: 1.0000 | ORAL_TABLET | Freq: Four times a day (QID) | ORAL | 0 refills | Status: AC | PRN

## 2021-07-16 MED ORDER — HYDROCODONE-ACETAMINOPHEN 5-325 MG PO TABS
2.0000 | ORAL_TABLET | Freq: Once | ORAL | Status: AC
  Administered 2021-07-16: 2 via ORAL
  Filled 2021-07-16: qty 2

## 2021-07-16 MED ORDER — CYCLOBENZAPRINE HCL 10 MG PO TABS: 10.0000 mg | ORAL_TABLET | Freq: Three times a day (TID) | ORAL | 0 refills | Status: AC | PRN

## 2021-07-16 NOTE — Discharge Instructions (Addendum)
Please use the Norco (hydrocodone/acetaminophen) for the first 1-2 days and then please only use for sleep.  From then on you may use the Flexeril (cyclobenzaprine) as prescribed in addition to Tylenol/Motrin/naproxen

## 2021-07-16 NOTE — ED Provider Notes (Signed)
[facility] Provider Note   Event Date/Time   First MD Initiated Contact with Patient 07/16/21 [phone]     (approximate) History  Fall  HPI Earl Murray is a 66 y.o. male who presents via EMS after a level fall which he slid down multiple steps of the staircase resulting in severe lower lumbar back pain bilaterally that is worse on the left than the right.  Patient describes 10/10 aching, nonradiating lower lumbar paraspinal musculature pain that is worse with any movement.  Patient denies any numbness/weakness/paresthesias down any extremity.  Patient denies any bowel/bladder incontinence.  Patient denies any head/neck injury or loss of consciousness during this fall. Physical Exam  Triage Vital Signs: ED Triage Vitals  Enc Vitals Group     BP 07/16/21 0836 (!) 184/98     Pulse Rate 07/16/21 0836 85     Resp 07/16/21 0836 18     Temp 07/16/21 0836 98 F (36.7 C)     Temp Source 07/16/21 0836 Oral     SpO2 07/16/21 0835 98 %     Weight 07/16/21 0837 200 lb (90.7 kg)     Height 07/16/21 0837 6\' 5"  (1.956 m)     Head Circumference --      Peak Flow --      Pain Score 07/16/21 0837 10     Pain Loc --      Pain Edu? --      Excl. in [place]? --    Most recent vital signs: Vitals:   07/16/21 0836 07/16/21 1000  BP: (!) 184/98 (!) 152/96  Pulse: 85 76  Resp: 18 16  Temp: 98 F (36.7 C)   SpO2:  97%   General: Awake, oriented x4. CV:  Good peripheral perfusion.  Resp:  Normal effort.  Abd:  No distention.  Other:  Elderly African-American male laying in bed in mild distress secondary to pain.  Straight leg raise negative bilaterally.  Patient is significantly tender to palpation in bilateral paraspinal musculature of the lumbar region on the left worse than the right with associated muscular spasm ED Results / Procedures / Treatments  Labs (all labs ordered are listed, but only abnormal results are displayed) Labs Reviewed - No data to display RADIOLOGY ED  MD interpretation: Three-view x-ray of the lumbar spine interpreted by me and shows new height loss at the level of L4 concerning for compression fracture as well as traumatic anterolisthesis CT of the lumbar spine interpreted by me and shows lucency about the pedicle screws at T10 bilaterally suggesting loosening as well as new L4 superior endplate compression fracture with approximately 60% height loss and mild foraminal narrowing on the right at L3-4 -Agree with radiology assessment Official radiology report(s): DG Lumbar Spine 2-3 Views  Result Date: 07/16/2021 CLINICAL DATA:  CT Oct 08, 2020. EXAM: LUMBAR SPINE - 2-3 VIEW COMPARISON:  None. FINDINGS: Posterior fusion spanning from the imaged thoracic spine through the sacrum. Interbody spacers at L1-L2 and L2-L3. New/interval height loss and superior endplate sclerosis at L4, compatible with fracture that may be acute.New grade 1 anterolisthesis of L3 on L4. Multilevel degenerative change, including disc height loss and facet arthropathy. IMPRESSION: 1. New/interval height loss and superior endplate sclerosis at L4, compatible with fracture that may be acute. 2. New grade 1 anterolisthesis of L3 on L4. Traumatic anterolisthesis is not excluded. 3. Findings not well evaluated by radiograph and recommend cross-sectional imaging to further evaluate. Findings and recommendations discussed with Dr. Cheri Fowler  via telephone at 9:25 PM Electronically Signed   By: Margaretha Sheffield M.D.   On: 07/16/2021 09:27   CT Lumbar Spine Wo Contrast  Result Date: 07/16/2021 CLINICAL DATA:  Lumbar spine compression fracture. EXAM: CT LUMBAR SPINE WITHOUT CONTRAST TECHNIQUE: Multidetector CT imaging of the lumbar spine was performed without intravenous contrast administration. Multiplanar CT image reconstructions were also generated. RADIATION DOSE REDUCTION: This exam was performed according to the departmental dose-optimization program which includes automated exposure  control, adjustment of the mA and/or kV according to patient size and/or use of iterative reconstruction technique. COMPARISON:  Lumbar spine radiographs 07/16/2021. CT of the lumbar spine 10/08/2020. FINDINGS: Spinal fusion hardware was extended from L3-S1 previously to now T10-S1. Pedicle screws were removed at L4. Bilateral pedicle screws are present at all other levels. Segmentation: 5 non rib-bearing lumbar type vertebral bodies are present. The lowest fully formed vertebral body is L5. Alignment: 7 mm anterolisthesis at L3-4 is new. 5 mm anterolisthesis at L2-3 is new. Vertebrae: L4 superior endplate compression fracture is new since prior CT. Vertebral body height is reduced to 10 mm, previously 25 mm. The superior endplate of L4 is displaced anteriorly and laterally. Vertebral body heights are otherwise maintained. Lucency is noted about the pedicle screws at T10 bilaterally. Paraspinal and other soft tissues: Limited imaging the abdomen is unremarkable. There is no significant adenopathy. No solid organ lesions are present. Disc levels: Disc levels at T12-L1 and above are unremarkable. L1-2: Disc spacer is in place. No definite bridging bone. Right laminectomy noted. No residual or recurrent stenosis is present. L2-3: Disc spacer is in place. Bridging bone is noted across the disc space. Left laminectomy is present. No residual or recurrent stenosis is present. Anterolisthesis at this level is new. L3-4: Wide laminectomy is present. Anterolisthesis is new. Central canal is decompressed. Mild foraminal narrowing is present on the right. L4-5: Wide laminectomy is present. Bridging bone is present across the disc space. Posterior elements are fused. Wide laminectomy noted. No residual or recurrent stenosis is present. L5-S1: Wide laminectomy noted. Posterior elements are fused. No residual or recurrent stenosis is present. IMPRESSION: 1. Spinal fusion hardware extends from L3-S1 previously to now T10-S1. 2.  Lucency about the pedicle screws at T10 bilaterally suggesting loosening. 3. New anterolisthesis at L3-4 and L2-3. 4. L4 superior endplate compression fracture is new since prior CT with 60% loss of height compared to the previous study. 5. Mild foraminal narrowing on the right at L3-4. Electronically Signed   By: San Morelle M.D.   On: 07/16/2021 10:16   PROCEDURES: Critical Care performed: No Procedures MEDICATIONS ORDERED IN ED: Medications  HYDROmorphone (DILAUDID) injection 0.5 mg (has no administration in time range)  HYDROcodone-acetaminophen (NORCO/VICODIN) 5-325 MG per tablet 2 tablet (2 tablets Oral Given 07/16/21 0843)   IMPRESSION / MDM / ASSESSMENT AND PLAN / ED COURSE  I reviewed the triage vital signs and the nursing notes.                             Differential diagnosis includes, but is not limited to, lumbar compression fracture, cauda equina syndrome, hardware dysfunction, pelvic fracture  Patient a 66 year old male who presents after a mechanical fall down onto his backside down a flight of stairs and is now complaining of significant lumbar pain that is worse on the left than the right and subjective left lower extremity weakness.  X-ray showed evidence of subtle abnormalities were interpreted  by me as well as spoke to the radiologist on-call who recommended further imaging to better evaluate this traumatic change.  A CT of the lumbar spine showed new L4 endplate compression fracture with 60% height loss as well as some neuroforaminal narrowing at L3-L4.  I spoke to our on-call neurosurgeon, Dr. Izora Ribas, who recommended outpatient follow-up with pain control.  I spoke to patient about these findings as well as what to expect moving forward and follow-up with neurosurgery as soon as possible.  Patient will be prescribed Norco and Flexeril in addition to instructions for over-the-counter analgesia.  The patient has been reexamined and is ready to be discharged.  All  diagnostic results have been reviewed and discussed with the patient/family.  Care plan has been outlined and the patient/family understands all current diagnoses, results, and treatment plans.  There are no new complaints, changes, or physical findings at this time.  All questions have been addressed and answered. All medications, if any, that were given while in the emergency department or any that are being prescribed have been reviewed with the patient/family.  All side effects and adverse reactions have been explained. Patient was instructed to, and agrees to follow-up with their primary care physician as well as return to the emergency department if any new or worsening symptoms develop.    FINAL CLINICAL IMPRESSION(S) / ED DIAGNOSES   Final diagnoses:  Compression fracture of L4 vertebra, initial encounter ([place])  Fall, initial encounter  Lumbar back pain   Rx / DC Orders   ED Discharge Orders          Ordered    HYDROcodone-acetaminophen (NORCO) 5-325 MG tablet  Every 6 hours PRN        07/16/21 1038    cyclobenzaprine (FLEXERIL) 10 MG tablet  3 times daily PRN        07/16/21 1038           Note:  This document was prepared using Dragon voice recognition software and may include unintentional dictation errors.   Naaman Plummer, MD 07/16/21 1044

## 2021-07-16 NOTE — ED Triage Notes (Signed)
Pt BIB EMS from home, fell from stairs 14-steps. C/O lower back pain, left rib cage pain. No LOC, no vomiting, no blood thinner, did not hit head. Back sx on 01/2021. Not on c-collar/backboard on ER arrival. Pt was able to get up from EMS stretcher to bed.  151/98 HR 80 SPO2 98% CBG 135

## 2021-07-29 ENCOUNTER — Encounter: Payer: Self-pay | Admitting: Emergency Medicine

## 2021-07-29 ENCOUNTER — Other Ambulatory Visit: Payer: Self-pay

## 2021-07-29 ENCOUNTER — Emergency Department
Admission: EM | Admit: 2021-07-29 | Discharge: 2021-07-29 | Disposition: A | Discharge status: Home / Self Care | Payer: Medicare Other | Attending: Emergency Medicine | Admitting: Emergency Medicine

## 2021-07-29 DIAGNOSIS — M545 Low back pain, unspecified: Secondary | ICD-10-CM

## 2021-07-29 DIAGNOSIS — Z760 Encounter for issue of repeat prescription: Secondary | ICD-10-CM | POA: Insufficient documentation

## 2021-07-29 DIAGNOSIS — M5440 Lumbago with sciatica, unspecified side: Secondary | ICD-10-CM | POA: Diagnosis not present

## 2021-07-29 MED ORDER — HYDROCODONE-ACETAMINOPHEN 5-325 MG PO TABS: 1.0000 | ORAL_TABLET | Freq: Four times a day (QID) | ORAL | 0 refills | Status: AC | PRN

## 2021-07-29 MED ORDER — CYCLOBENZAPRINE HCL 10 MG PO TABS: 10.0000 mg | ORAL_TABLET | Freq: Three times a day (TID) | ORAL | 0 refills | Status: AC | PRN

## 2021-07-29 MED ORDER — HYDROCODONE-ACETAMINOPHEN 5-325 MG PO TABS
1.0000 | ORAL_TABLET | Freq: Once | ORAL | Status: AC
  Administered 2021-07-29: 1 via ORAL
  Filled 2021-07-29: qty 1

## 2021-07-29 NOTE — ED Provider Notes (Signed)
? ?[facility] ?Provider Note ? ? ? Event Date/Time  ? First MD Initiated Contact with Patient 07/29/21 1946   ?  (approximate) ? ? ?History  ? ?Medication Refill ? ? ?HPI ? ?Earl Murray is a 66 y.o. male with known history of L4 fracture who comes in with concerns for back pain.  Patient reports that he ran out of his pain medications.  I reviewed the database and he is on OxyContin as well but he states that he has been moving around a lot and he lost his prescription.  However he does report that he was given some Norco when this accident happened that he had been using for his pain and that it was helping a lot but he used it all and so he is coming in for additional pain medication.  He reports the Flexeril also helped a lot.  He denies it making him too sleepy.  He denies any new urinary incontinence, rectal incontinence, numbness in his bottom, worsening weakness.  He is still able to ambulate.  Reports he is using lidocaine patches, Aleve at home without any relief. ? ?I reviewed the chart where patient was seen on 07/22/2021 by neurosurgery.  He is status post extensive thoracolumbar reconstruction had imaging done that showed that his S1 screw appears to have disconnected from the rod and the plan was to place him on a LSO and to follow-up in 6 weeks.  I reviewed the ER note from 2/22 where patient was given prescription for Norco and Flexeril 10 mg. ? ? ?Physical Exam  ? ?Triage Vital Signs: ?ED Triage Vitals  ?Enc Vitals Group  ?   BP 07/29/21 1900 (!) 160/91  ?   Pulse Rate 07/29/21 1900 84  ?   Resp 07/29/21 1900 17  ?   Temp 07/29/21 1900 98.7 ?F (37.1 ?C)  ?   Temp Source 07/29/21 1900 Oral  ?   SpO2 07/29/21 1900 99 %  ?   Weight 07/29/21 1844 199 lb 15.3 oz (90.7 kg)  ?   Height 07/29/21 1844 '6\' 5"'$  (1.956 m)  ?   Head Circumference --   ?   Peak Flow --   ?   Pain Score 07/29/21 1844 8  ?   Pain Loc --   ?   Pain Edu? --   ?   Excl. in [place]? --   ? ? ?Most recent vital  signs: ?Vitals:  ? 07/29/21 1900  ?BP: (!) 160/91  ?Pulse: 84  ?Resp: 17  ?Temp: 98.7 ?F (37.1 ?C)  ?SpO2: 99%  ? ? ? ?General: Awake, no distress.  ?CV:  Good peripheral perfusion.  ?Resp:  Normal effort.  ?Abd:  No distention.  ?Other:  Some mild lower back pain.  Good strength in his legs.  Sensation intact.  Patient able to ambulate ? ? ?IMPRESSION / MDM / ASSESSMENT AND PLAN / ED COURSE  ?I reviewed the triage vital signs and the nursing notes. ?             ?               ? ?Patient comes in with chronic back pain  seen in the emergency room being followed by neurosurgery with plan to get a brace on Monday but coming in for running out of his pain medication.  Explained to patient that the emergency room is not the place to fill his pain medication however given he may have some  difficulty getting close follow-up with the [place] neurosurgery will give him 2 days of pain medication to help facilitate these conversations as well as 7 days of Flexeril given pt had the fall a week ago with compression fracture.  He denies any new falls to suggest new fractures and denies any symptoms to suggest cord compression.  He understands that in the future he needs to follow-up with either neurosurgery or the [place] but feels comfortable with this plan and discharged home ? ?FINAL CLINICAL IMPRESSION(S) / ED DIAGNOSES  ? ?Final diagnoses:  ?Low back pain, unspecified back pain laterality, unspecified chronicity, unspecified whether sciatica present  ? ? ? ?Rx / DC Orders  ? ?ED Discharge Orders   ? ?      Ordered  ?  cyclobenzaprine (FLEXERIL) 10 MG tablet  3 times daily PRN       ? 07/29/21 2000  ?  HYDROcodone-acetaminophen (NORCO/VICODIN) 5-325 MG tablet  Every 6 hours PRN       ? 07/29/21 2000  ? ?  ?  ? ?  ? ? ? ?Note:  This document was prepared using Dragon voice recognition software and may include unintentional dictation errors. ?  ?Vanessa [place], MD ?07/29/21 2005 ? ?

## 2021-07-29 NOTE — Discharge Instructions (Signed)
Call the [facility] team and your neurosurgery team to discuss how you are going to get future pain medications for your known back problems because we would not be prescribing additional pain meds from the emergency room but we have given you a few pills to help facilitate conversations with them ? ? ?

## 2021-07-29 NOTE — ED Notes (Signed)
Pt teaching provided on medications that may cause drowsiness. Pt instructed not to drive or operate heavy machinery while taking the prescribed medication. Pt verbalized understanding.  ? ?Pt provided discharge instructions and prescription information. Pt was given the opportunity to ask questions and questions were answered. Discharge signature not obtained in the setting of the COVID-19 pandemic in order to reduce high touch surfaces.  ? ?

## 2021-07-29 NOTE — ED Triage Notes (Signed)
Fall at the end of February and dx with fx back.  Seen by PCP and is scheduled to have a back brace fit on Monday.  Arrives today c/o pain.  States initial prescribed Vicodin and muscle relaxer, which helped with pain. ? ?AAOx3.  Skin warm and dry. NAD. Ambulates with easy and steady gait. ?

## 2021-08-15 ENCOUNTER — Other Ambulatory Visit: Payer: Self-pay

## 2021-08-15 ENCOUNTER — Ambulatory Visit
Admission: EM | Admit: 2021-08-15 | Discharge: 2021-08-15 | Disposition: A | Discharge status: Home / Self Care | Payer: Medicaid Other | Attending: Internal Medicine | Admitting: Internal Medicine

## 2021-08-15 DIAGNOSIS — S39012A Strain of muscle, fascia and tendon of lower back, initial encounter: Secondary | ICD-10-CM

## 2021-08-15 MED ORDER — BACLOFEN 10 MG PO TABS: ORAL_TABLET | ORAL | 0 refills | Status: DC

## 2021-08-15 NOTE — ED Provider Notes (Signed)
[facility]    CSN: [id number] Arrival date & time: 08/15/21  1455      History   Chief Complaint Chief Complaint  Patient presents with   Back Pain    HPI Earl Murray is a 66 y.o. male who presents with back pain after a near fall due to stepping in a ditch in his yard. He did not fall at all. He re-injured his L lower back area. Has hx of chronic back pain and new L4 compression fracture from 2 weeks ago. He called his orthopaedist, but they could not see him today. He denies pain radiating to his legs, or having any kind of numbness.  He has not taken anything for pain.    Past Medical History:  Diagnosis Date   Hypertension    Neck injury     Patient Active Problem List   Diagnosis Date Noted   Allergic rhinitis 04/25/2021   Bladder cancer (HCC) 04/25/2021   Closed fracture of metatarsal bone 04/25/2021   Generalized abdominal pain 04/25/2021   Gross hematuria 04/25/2021   Lateral epicondylitis of elbow 04/25/2021   Major depressive disorder, recurrent, unspecified (HCC) 04/25/2021   Knee pain 04/25/2021   Migraine 04/25/2021   Cervicalgia 04/25/2021   Neuroma of foot 04/25/2021   Opioid dependence in remission (HCC) 04/25/2021   Encounter for screening for malignant neoplasm of respiratory organs 04/25/2021   Pain, unspecified 04/25/2021   Pancreatitis 04/25/2021   Paresthesia of skin 04/25/2021   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   Sciatica 04/25/2021   Syncope and collapse 04/25/2021   Tobacco use 04/25/2021   Diarrhea, unspecified 03/25/2021   Degeneration of lumbosacral intervertebral disc 03/25/2021   DDD (degenerative disc disease), cervical 03/25/2021   Chronic sinusitis 03/25/2021   Family history of malignant neoplasm of digestive organs 03/25/2021   Gallbladder disease 03/25/2021   DVT (deep venous thrombosis) (HCC) 03/25/2021   Prediabetes 02/05/2021   Stage 3a chronic kidney disease (HCC) 02/05/2021   Cocaine dependence in  remission (HCC) 01/31/2021   Hyperlipidemia 01/31/2021   Obstructive sleep apnea 01/31/2021   Other and unspecified alcohol dependence, in remission 01/31/2021   Other, mixed, or unspecified nondependent drug abuse, unspecified 01/31/2021   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/2021   Scoliosis of thoracic spine 01/31/2021   Abnormal magnetic resonance imaging of liver    Choledocholithiasis 10/25/2019   Hypertension    Lumbar radiculopathy 09/15/2018   Displacement of lumbar intervertebral disc without myelopathy 09/12/2018   Cervical radiculitis 09/12/2018   Hallux valgus, acquired 04/20/2016   Hammer toes of both feet 04/20/2016   Stress reaction 04/20/2016   Backache 05/25/1998   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 Authorizing Provider  baclofen (LIORESAL) 10 MG tablet 1-2 tid for back muscle spasm 08/15/21  Yes Rodriguez-Southworth, Nettie Elm, PA-C  amLODipine (NORVASC) 10 MG tablet Take 10 mg by mouth daily.     [provider]  apixaban (ELIQUIS) 5 MG TABS tablet Take  1 tablet (5 mg total) by mouth 2 (two) times daily. 03/25/21   Annice Needy, MD  atorvastatin (LIPITOR) 20 MG tablet TAKE ONE-HALF TABLET BY MOUTH AT BEDTIME FOR CHOLESTEROL 05/23/20   [provider]  chlorthalidone (HYGROTON) 25 MG tablet TAKE ONE TABLET BY MOUTH EVERY DAY FOR BLOOD PRESSURE INSTEAD OF HYDROCHLOROTHIAZIDE 12/03/20   [provider]  dicyclomine (BENTYL) 10 MG capsule Take 1 capsule (10 mg total) by mouth 3 (three) times daily as needed for  up to 14 days for spasms. or abdominal pain 12/13/20 12/27/20  Loleta Rose, MD  fexofenadine [facility] ALLERGY) 60 MG tablet Take 1 tablet (60 mg total) by mouth daily. For day time drainage 12/15/20   Rodriguez-Southworth, Nettie Elm, PA-C  fluticasone [facility]) 50 MCG/ACT nasal spray Place 2 sprays into both nostrils daily. At night time for stuffy nose 12/15/20   Rodriguez-Southworth, Nettie Elm, PA-C  hydrochlorothiazide (HYDRODIURIL) 25 MG tablet Take 1 tablet (25 mg total) by mouth daily. 08/15/18   Tommie Sams, DO  mirtazapine (REMERON) 7.5 MG tablet  11/25/20   [provider]  omeprazole (PRILOSEC OTC) 20 MG tablet Take 1 tablet (20 mg total) by mouth daily. 12/13/20 12/13/21  Loleta Rose, MD  Oxycodone HCl 10 MG TABS Take by mouth.    [provider]  pantoprazole (PROTONIX) 40 MG tablet TAKE ONE TABLET BY MOUTH EVERY DAY TO CONTROL STOMACH ACID TAKE 30 MINUTES BEFORE THE LARGES MEAL OF THE DAY 12/03/20   [provider]  sucralfate (CARAFATE) 1 g tablet Take 1 tablet (1 g total) by mouth 4 (four) times daily as needed (for abdominal discomfort, nausea, and/or vomiting). 12/13/20   Loleta Rose, MD  venlafaxine XR (EFFEXOR-XR) 37.5 MG 24 hr capsule  11/13/20   [provider]  gabapentin (NEURONTIN) 300 MG capsule gabapentin 300 mg capsule  one tab bid-tid  03/01/19  [provider]  ipratropium (ATROVENT) 0.06 % nasal spray Place 2 sprays into both nostrils 4 (four) times daily as needed for rhinitis. 10/22/19 03/16/20  Verlee Monte, NP  levocetirizine (XYZAL) 5 MG tablet Take 1 tablet (5 mg total) by mouth every evening. 09/30/19 03/16/20  Tommie Sams, DO    Family History Family History  Problem Relation Age of Onset   Hypertension Mother    Heart disease Mother    Colon cancer Father     Social History Social History   Tobacco Use   Smoking status: Former    Types: Cigarettes    Quit date: 05/2018    Years since quitting: 3.2   Smokeless  tobacco: Former  Building services engineer Use: Never used  Substance Use Topics   Alcohol use: Not Currently   Drug use: Not Currently     Allergies   Duloxetine, Lisinopril, Tylenol with codeine #3 [acetaminophen-codeine], Motrin [ibuprofen], and Tramadol   Review of Systems Review of Systems  Genitourinary:  Negative for difficulty urinating.  Musculoskeletal:  Positive for back pain.  Neurological:  Negative for weakness and numbness.    Physical Exam Triage Vital Signs ED Triage Vitals  Enc Vitals Group     BP 08/15/21 1504 (!) 167/80     Pulse Rate 08/15/21 1504 93     Resp 08/15/21 1504 18     Temp 08/15/21 1504 98.3 F (36.8 C)     Temp Source 08/15/21 1504 Oral     SpO2 08/15/21 1504 100 %     Weight --  Height --      Head Circumference --      Peak Flow --      Pain Score 08/15/21 1505 10     Pain Loc --      Pain Edu? --      Excl. in [place]? --    No data found.  Updated Vital Signs BP (!) 167/80 (BP Location: Left Arm)   Pulse 93   Temp 98.3 F (36.8 C) (Oral)   Resp 18   SpO2 100%   Visual Acuity Right Eye Distance:   Left Eye Distance:   Bilateral Distance:    Right Eye Near:   Left Eye Near:    Bilateral Near:     Physical Exam Vitals and nursing note reviewed.  Constitutional:      Appearance: He is normal weight.  HENT:     Right Ear: External ear normal.     Left Ear: External ear normal.  Eyes:     General: No scleral icterus.    Conjunctiva/sclera: Conjunctivae normal.  Pulmonary:     Effort: Pulmonary effort is normal.  Musculoskeletal:        General: Normal range of motion.     Cervical back: Neck supple.     Comments: BACK- has local tenderness on L mid and lower lumbar muscle region. Lateral flexion to the L provoked more pain than to his R. Anterior flexion only to 40 degrees provoked pain. Posterior flexion or rotation was not tested due to pain. Has neg SLR.   Skin:    General: Skin is warm and dry.     Findings: No  bruising, erythema or rash.  Neurological:     Mental Status: He is alert and oriented to person, place, and time.     Gait: Gait normal.  Psychiatric:        Mood and Affect: Mood normal.        Behavior: Behavior normal.        Thought Content: Thought content normal.        Judgment: Judgment normal.     UC Treatments / Results  Labs (all labs ordered are listed, but only abnormal results are displayed) Labs Reviewed - No data to display  EKG   Radiology No results found.  Procedures Procedures (including critical care time)  Medications Ordered in UC Medications - No data to display  Initial Impression / Assessment and Plan / UC Course  I have reviewed the triage vital signs and the nursing notes. I reviewed his last spine xray and CT report from a few weeks ago.  Looks like a plain film did not show the compression fracture. I placed pt on Baclofen as noted. He is to follow up with his ortho as scheduled or sooner if need to.     Final Clinical Impressions(s) / UC Diagnoses   Final diagnoses:  Strain of lumbar region, initial encounter     Discharge Instructions      Use ice on area of pain every 2 hours while awake for 48 hours from the injury, then you may alternate with heat.      ED Prescriptions     Medication Sig Dispense Auth. Provider   baclofen (LIORESAL) 10 MG tablet 1-2 tid for back muscle spasm 30 each Rodriguez-Southworth, Nettie Elm, PA-C      PDMP not reviewed this encounter.   Garey Ham, [place] 08/15/21 9604

## 2021-08-15 NOTE — ED Triage Notes (Signed)
Pt  present back pain from stepping in a ditch in his yard and fall down and now is having some back pain.   ?

## 2021-08-15 NOTE — Discharge Instructions (Signed)
Use ice on area of pain every 2 hours while awake for 48 hours from the injury, then you may alternate with heat.  ?

## 2021-08-17 ENCOUNTER — Emergency Department: Payer: Medicare Other

## 2021-08-17 ENCOUNTER — Other Ambulatory Visit: Payer: Self-pay

## 2021-08-17 ENCOUNTER — Encounter: Payer: Self-pay | Admitting: Emergency Medicine

## 2021-08-17 ENCOUNTER — Emergency Department
Admission: EM | Admit: 2021-08-17 | Discharge: 2021-08-17 | Disposition: A | Discharge status: Home / Self Care | Payer: Medicare Other | Attending: Emergency Medicine | Admitting: Emergency Medicine

## 2021-08-17 DIAGNOSIS — S199XXA Unspecified injury of neck, initial encounter: Secondary | ICD-10-CM | POA: Diagnosis not present

## 2021-08-17 DIAGNOSIS — M542 Cervicalgia: Secondary | ICD-10-CM

## 2021-08-17 DIAGNOSIS — G8929 Other chronic pain: Secondary | ICD-10-CM

## 2021-08-17 DIAGNOSIS — M545 Low back pain, unspecified: Secondary | ICD-10-CM | POA: Diagnosis not present

## 2021-08-17 DIAGNOSIS — K0889 Other specified disorders of teeth and supporting structures: Secondary | ICD-10-CM | POA: Diagnosis not present

## 2021-08-17 DIAGNOSIS — W228XXA Striking against or struck by other objects, initial encounter: Secondary | ICD-10-CM | POA: Insufficient documentation

## 2021-08-17 DIAGNOSIS — N189 Chronic kidney disease, unspecified: Secondary | ICD-10-CM | POA: Diagnosis not present

## 2021-08-17 DIAGNOSIS — I129 Hypertensive chronic kidney disease with stage 1 through stage 4 chronic kidney disease, or unspecified chronic kidney disease: Secondary | ICD-10-CM | POA: Insufficient documentation

## 2021-08-17 IMAGING — CR DG LUMBAR SPINE 2-3V
5 series · 5 of 5 positions shown · non-contrast
Comparison: [DATE], radiographs and CT scan.

CLINICAL DATA: Fall.  Left-sided low back pain.

EXAM:
LUMBAR SPINE - 2-3 VIEW

[l-spine ap (1 of 2)]
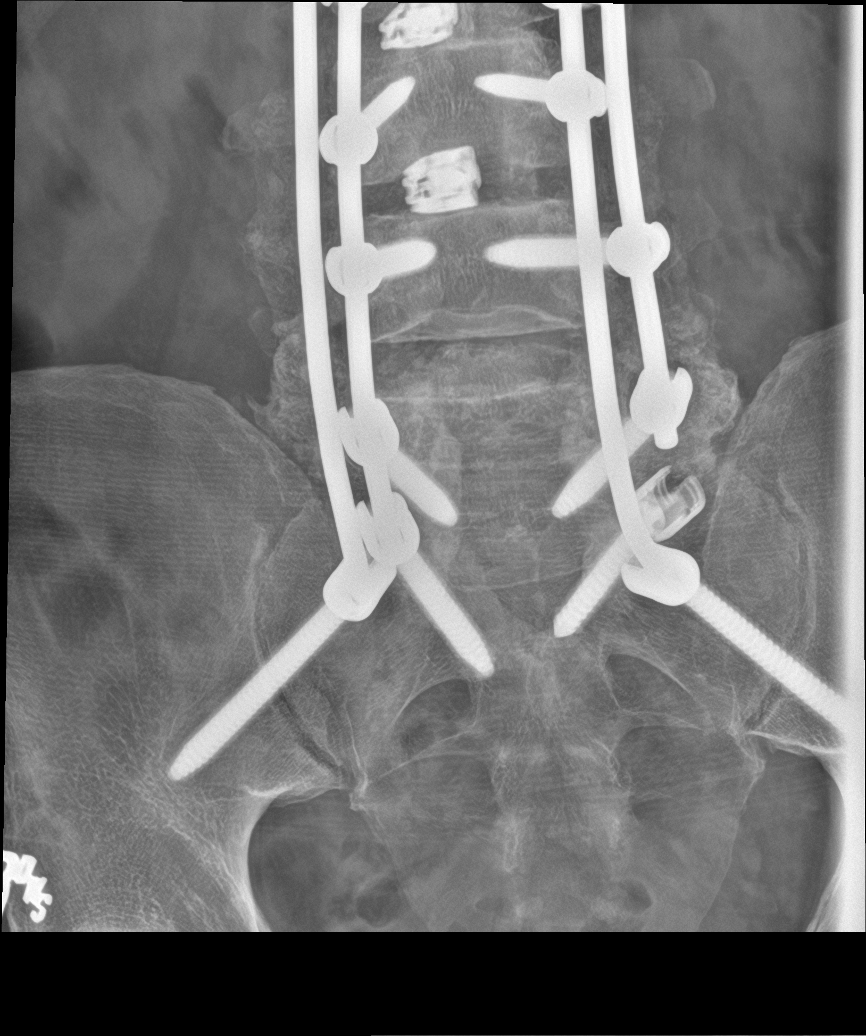

[l-spine lat (1 of 2)]
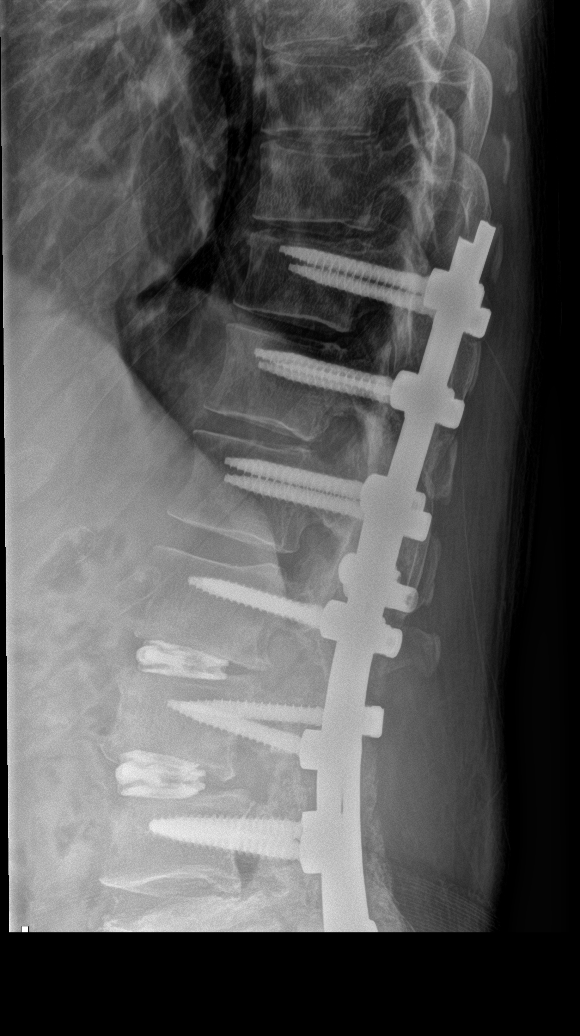

[l-spine spot]
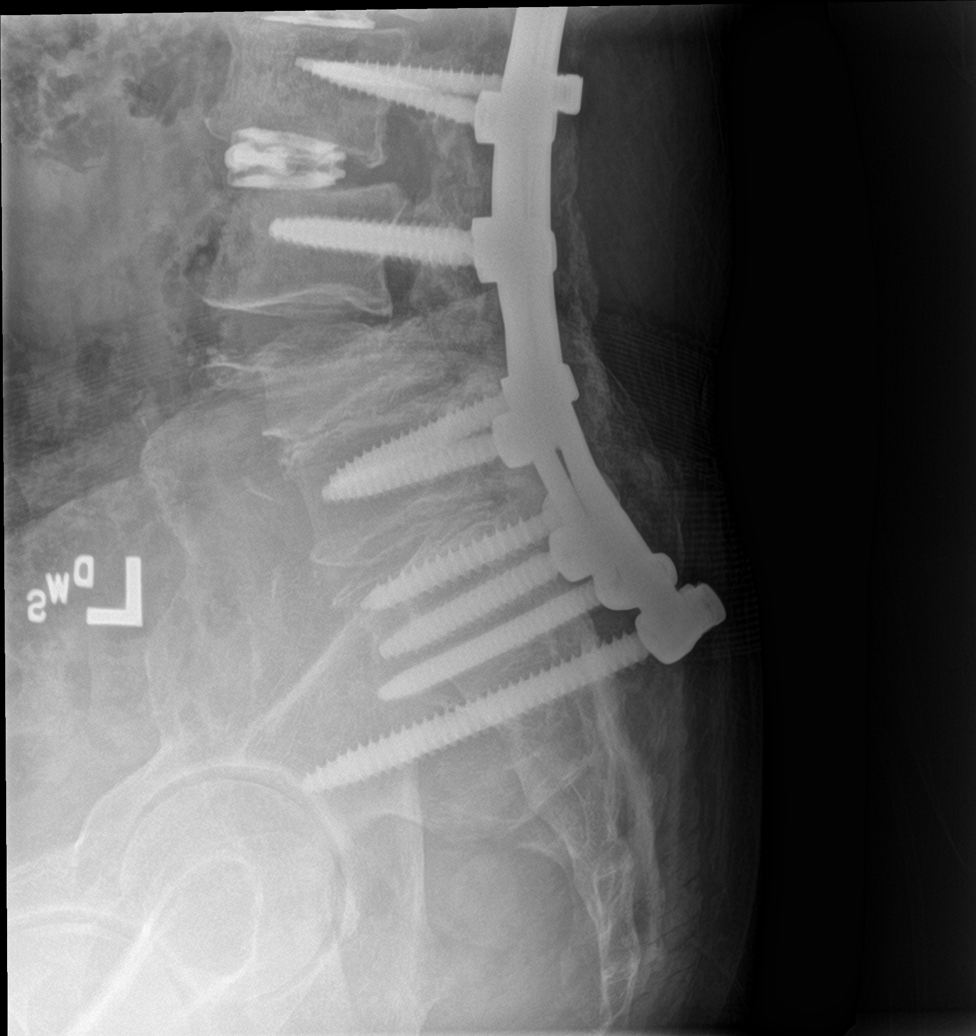

[l-spine ap (2 of 2)]
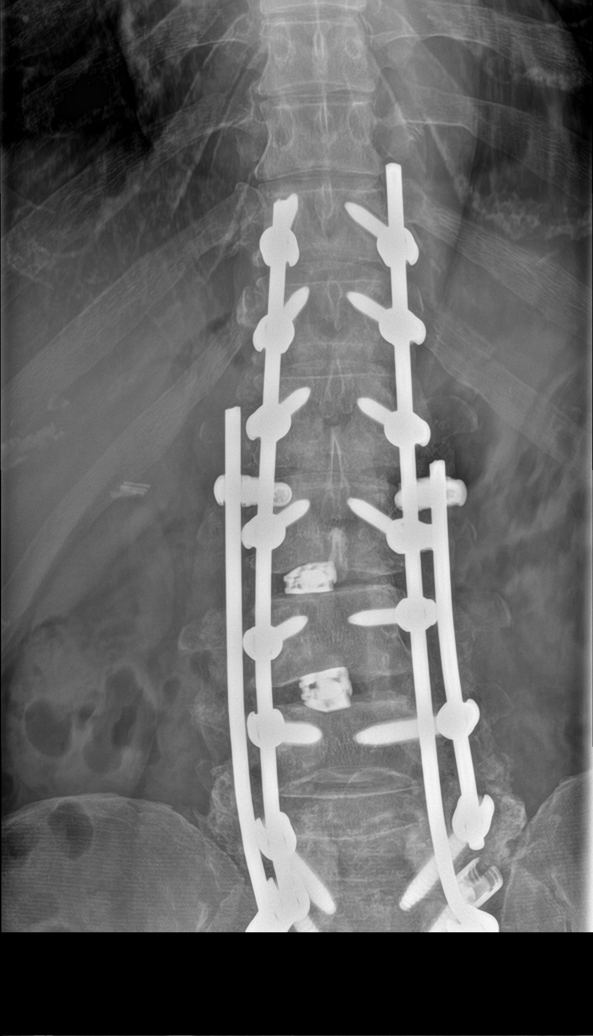

[l-spine lat (2 of 2)]
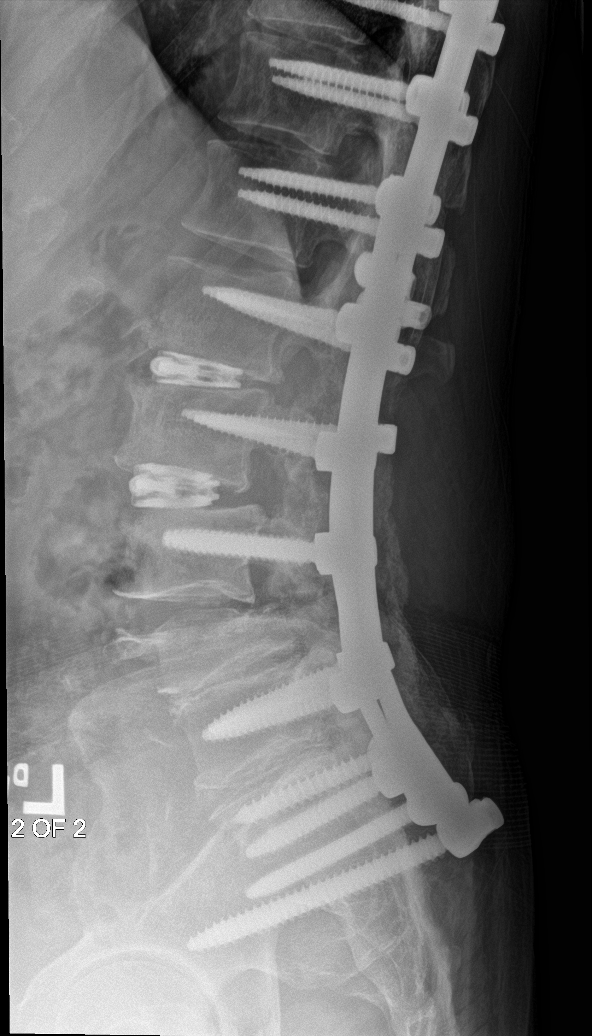

[5 of 5 positions shown; findings below may reference images not displayed]

FINDINGS: No acute fracture.

Chronic L4 fracture is stable from the prior radiographs and CT,
spine vertebrae labeling based on the prior exams. No acute
fracture.

Grade 1 anterolisthesis, L2 on L3 and L3 and L4.  This is stable.

Status post long posterior fusion, T10 through the sacrum,
unchanged. Well-positioned intervertebral spacers/cages at L1-L2 and
L2-L3, also stable.

Lucency adjacent to the superior most pedicle screws is unchanged.

Soft tissues are unremarkable.
IMPRESSION: 1. No acute fracture and no change from the recent prior lumbar
spine radiographs and lumbar spine CT.

## 2021-08-17 MED ORDER — LIDOCAINE 5 % EX PTCH
1.0000 | MEDICATED_PATCH | CUTANEOUS | Status: DC
  Administered 2021-08-17: 1 via TRANSDERMAL
  Filled 2021-08-17: qty 1

## 2021-08-17 MED ORDER — KETOROLAC TROMETHAMINE 30 MG/ML IJ SOLN
30.0000 mg | Freq: Once | INTRAMUSCULAR | Status: AC
  Administered 2021-08-17: 30 mg via INTRAMUSCULAR
  Filled 2021-08-17: qty 1
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
## 2021-08-17 NOTE — ED Notes (Signed)
Pt transported to CT via stretcher at this time.  

## 2021-08-17 NOTE — ED Notes (Signed)
Pt transported to CT via W/C at this time.  ?

## 2021-08-17 NOTE — ED Notes (Signed)
Pt left prior to receiving discharge paperwork. Pt failed to notify staff before leaving. No discharge VS obtained. ?

## 2021-08-17 NOTE — ED Notes (Addendum)
Patient used call light to alert nurses station that he had fallen in the floor. This nurse in room to assess the situation to find patient laying on right side beside the stretcher next to call light. Patient states he fell out of the bed reaching for the call light that was in the floor. Bed locked and in lowest position. MD made aware of patient in floor and assisted this nurse to get patient to feet and back onto stretcher. Patient c/o back and left hip pain. Patient remains A&Ox4. Patient also wearing personal non skid shoes. [place] made aware and assessed patient, new orders placed.  ?
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
## 2021-09-12 ENCOUNTER — Other Ambulatory Visit: Payer: Self-pay

## 2021-09-12 ENCOUNTER — Emergency Department: Payer: Medicare Other

## 2021-09-12 DIAGNOSIS — Z7901 Long term (current) use of anticoagulants: Secondary | ICD-10-CM | POA: Insufficient documentation

## 2021-09-12 DIAGNOSIS — I10 Essential (primary) hypertension: Secondary | ICD-10-CM | POA: Diagnosis not present

## 2021-09-12 DIAGNOSIS — R229 Localized swelling, mass and lump, unspecified: Secondary | ICD-10-CM | POA: Insufficient documentation

## 2021-09-12 DIAGNOSIS — X501XXA Overexertion from prolonged static or awkward postures, initial encounter: Secondary | ICD-10-CM | POA: Insufficient documentation

## 2021-09-12 DIAGNOSIS — S8391XA Sprain of unspecified site of right knee, initial encounter: Secondary | ICD-10-CM | POA: Diagnosis not present

## 2021-09-12 DIAGNOSIS — Z79899 Other long term (current) drug therapy: Secondary | ICD-10-CM | POA: Diagnosis not present

## 2021-09-12 DIAGNOSIS — S8991XA Unspecified injury of right lower leg, initial encounter: Secondary | ICD-10-CM | POA: Diagnosis present

## 2021-09-12 IMAGING — CR DG KNEE COMPLETE 4+V*R*
4 series · 4 of 4 positions shown · non-contrast
Comparison: None.

CLINICAL DATA: Pain

EXAM:
RIGHT KNEE - COMPLETE 4+ VIEW

[knee ap]
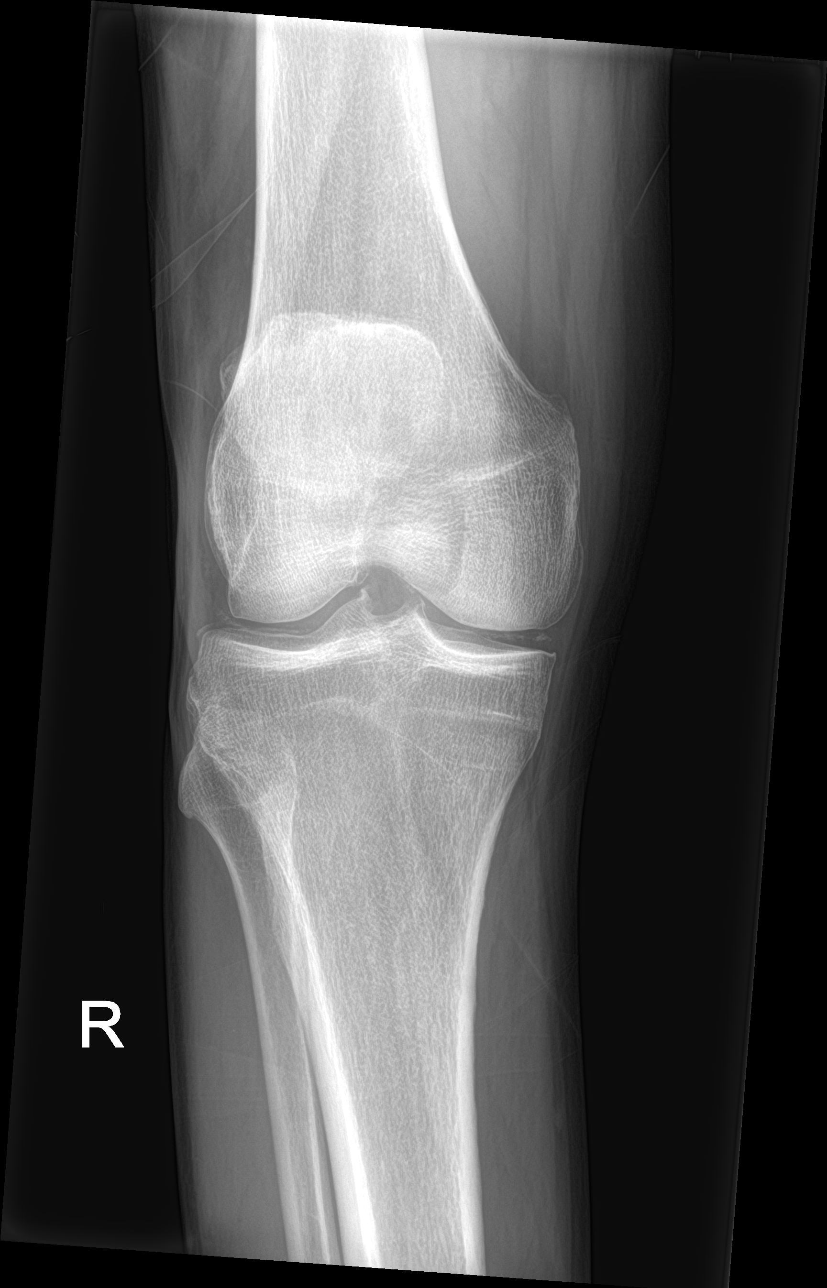

[knee obl (1 of 2)]
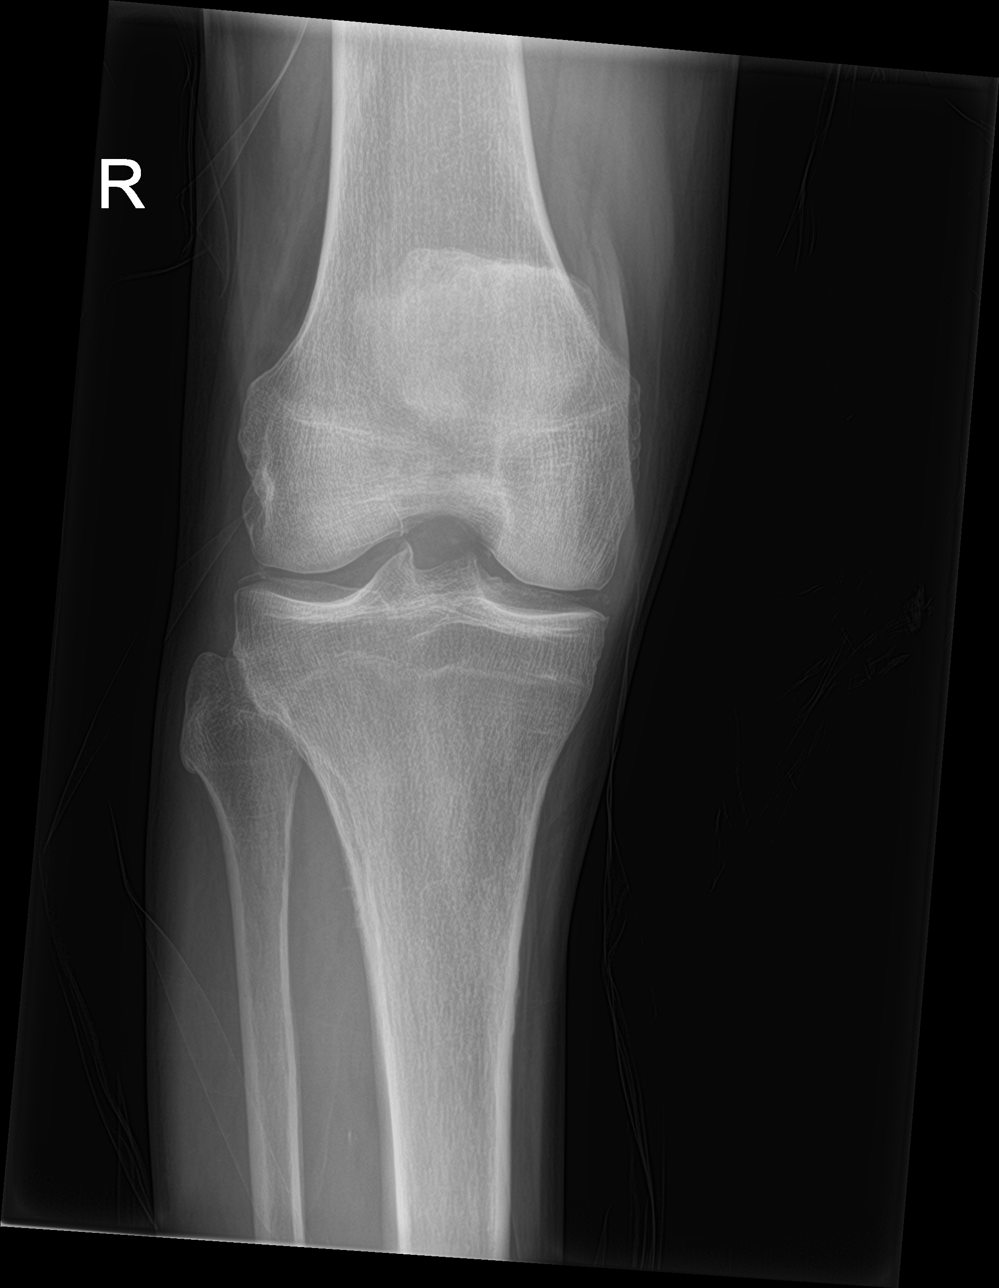

[knee obl (2 of 2)]
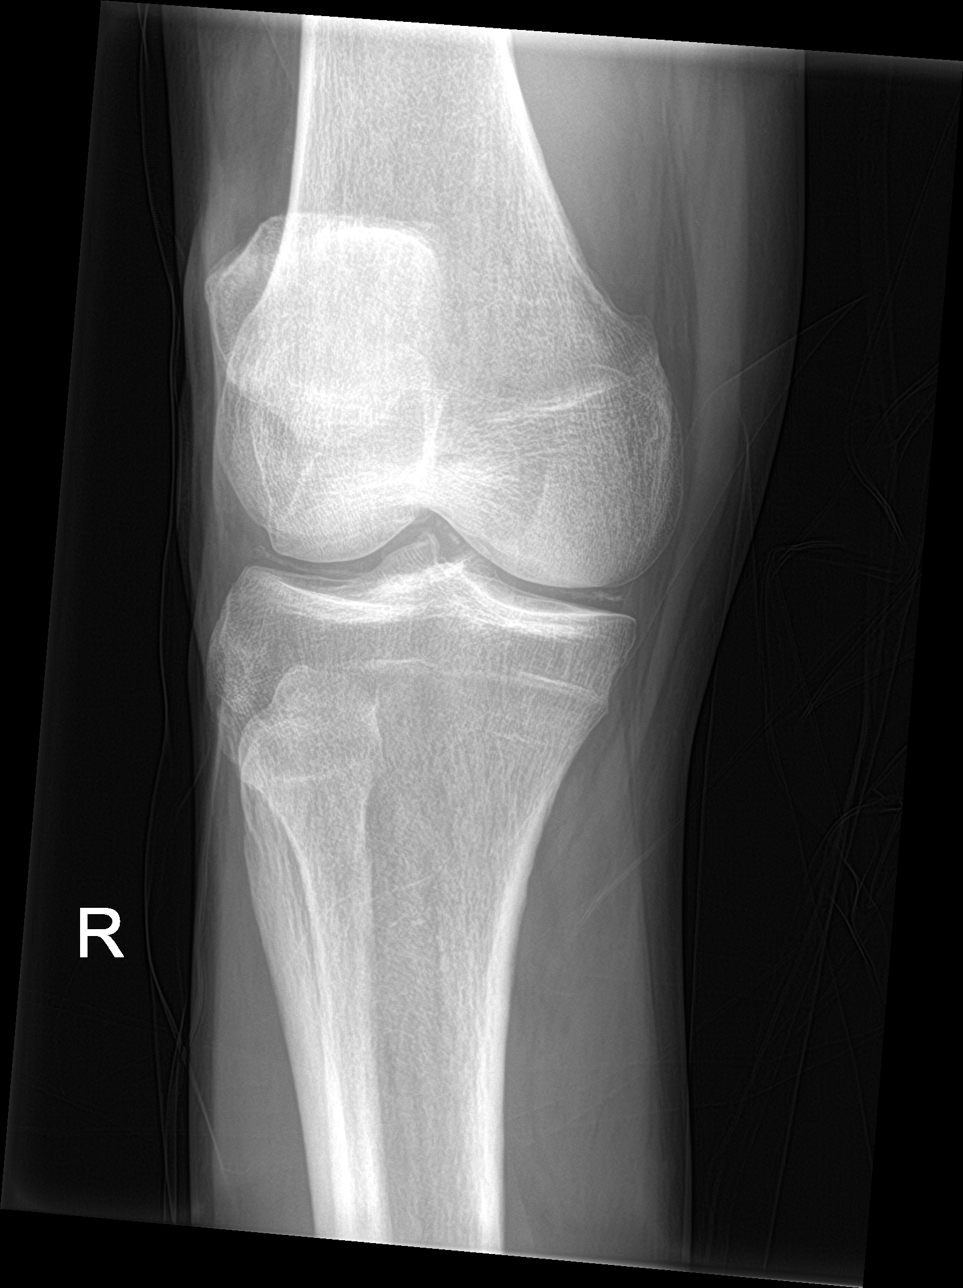

[knee lat]
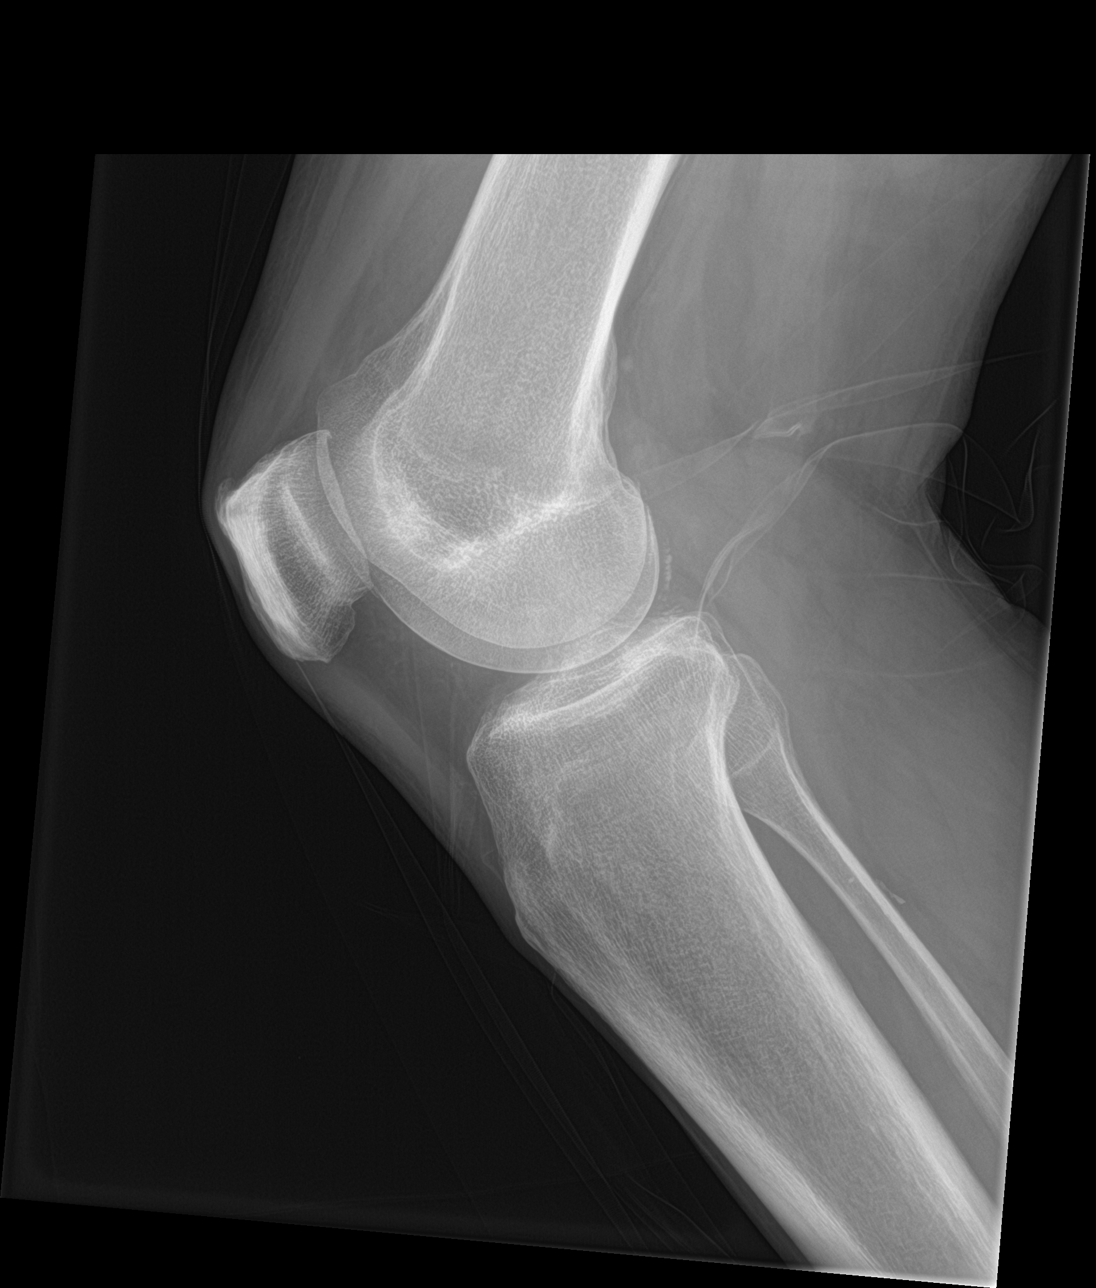

[4 of 4 positions shown; findings below may reference images not displayed]

FINDINGS: No fracture or dislocation is seen.

Very mild degenerative changes with chondrocalcinosis in the medial
and lateral compartments.

Visualized soft tissues are within normal limits.

No suprapatellar knee joint effusion.
IMPRESSION: No fracture or dislocation is seen.

Mild degenerative changes with chondrocalcinosis.

## 2021-09-12 IMAGING — CR DG LUMBAR SPINE COMPLETE 4+V
4 series · 4 of 4 positions shown · non-contrast
Comparison: Lumbar spine radiographs dated [DATE]. CT lumbar
spine dated [DATE].

CLINICAL DATA: Pain

EXAM:
LUMBAR SPINE - COMPLETE 4+ VIEW

[l-spine ap]
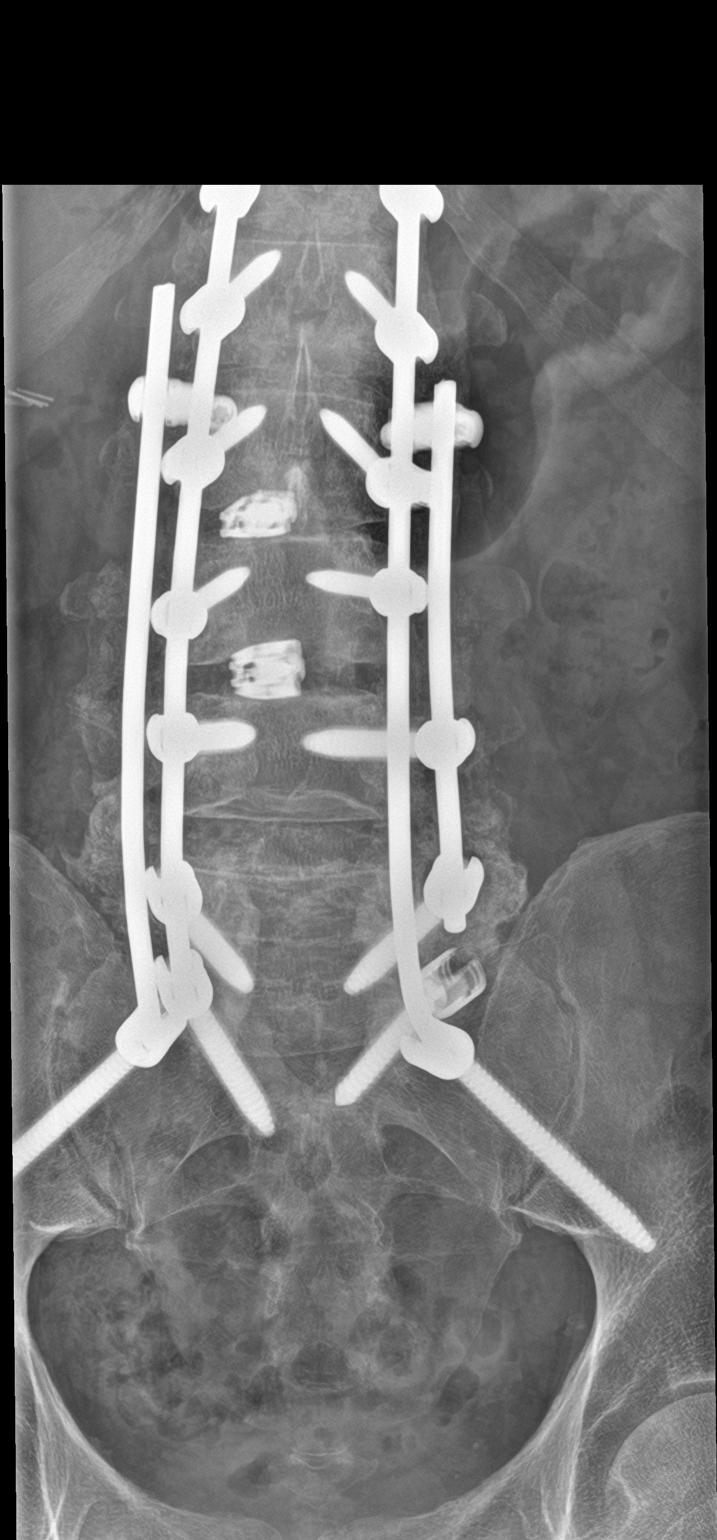

[l-spine obl (1 of 2)]
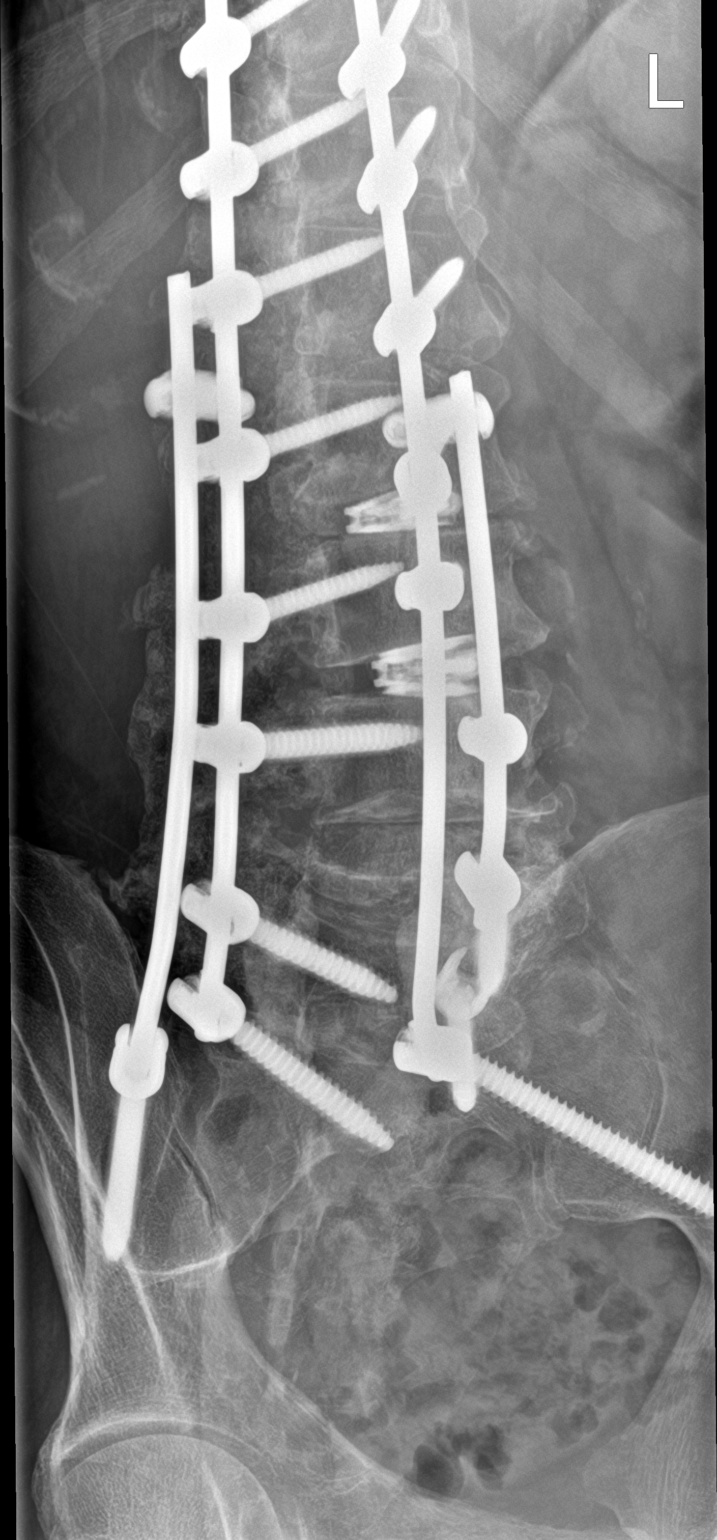

[l-spine obl (2 of 2)]
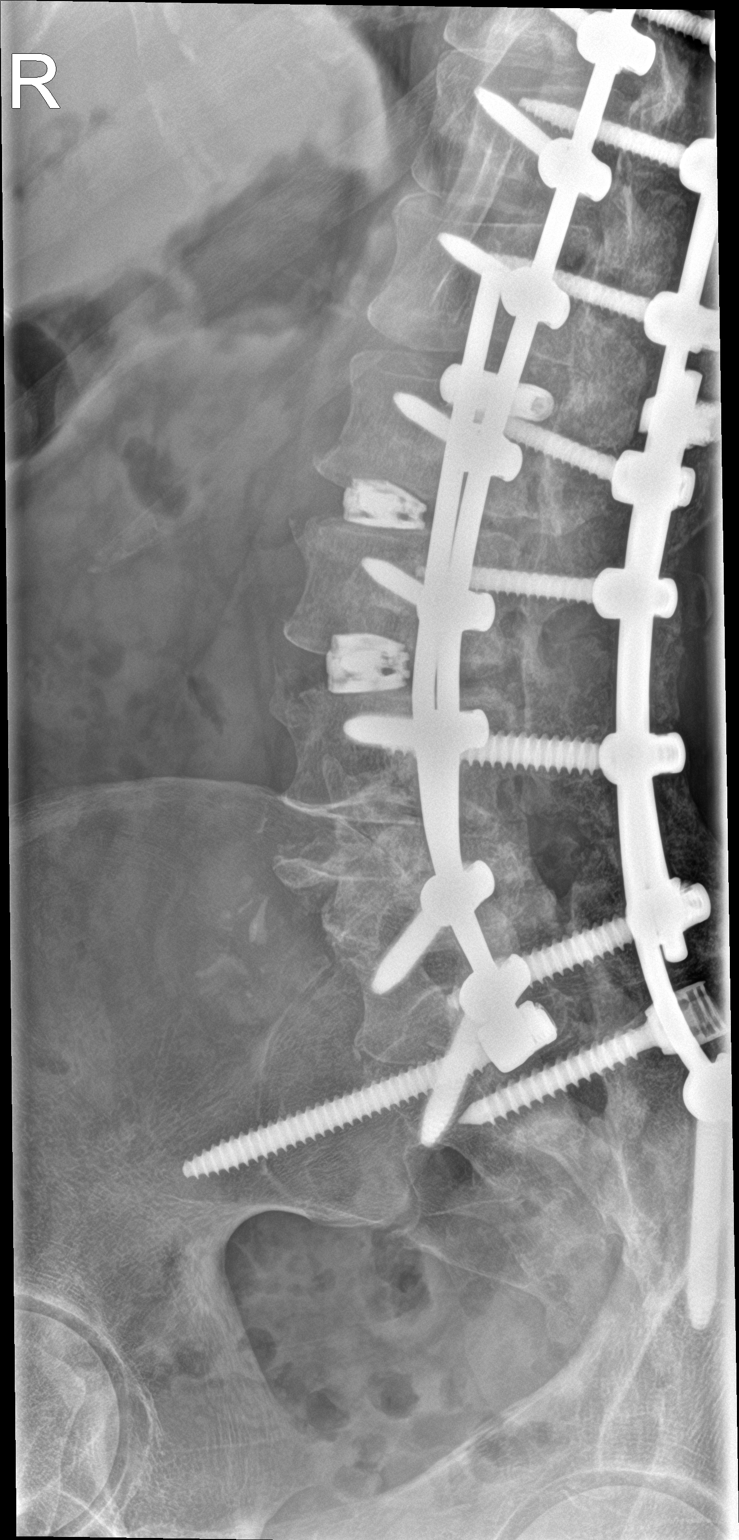

[l-spine lat]
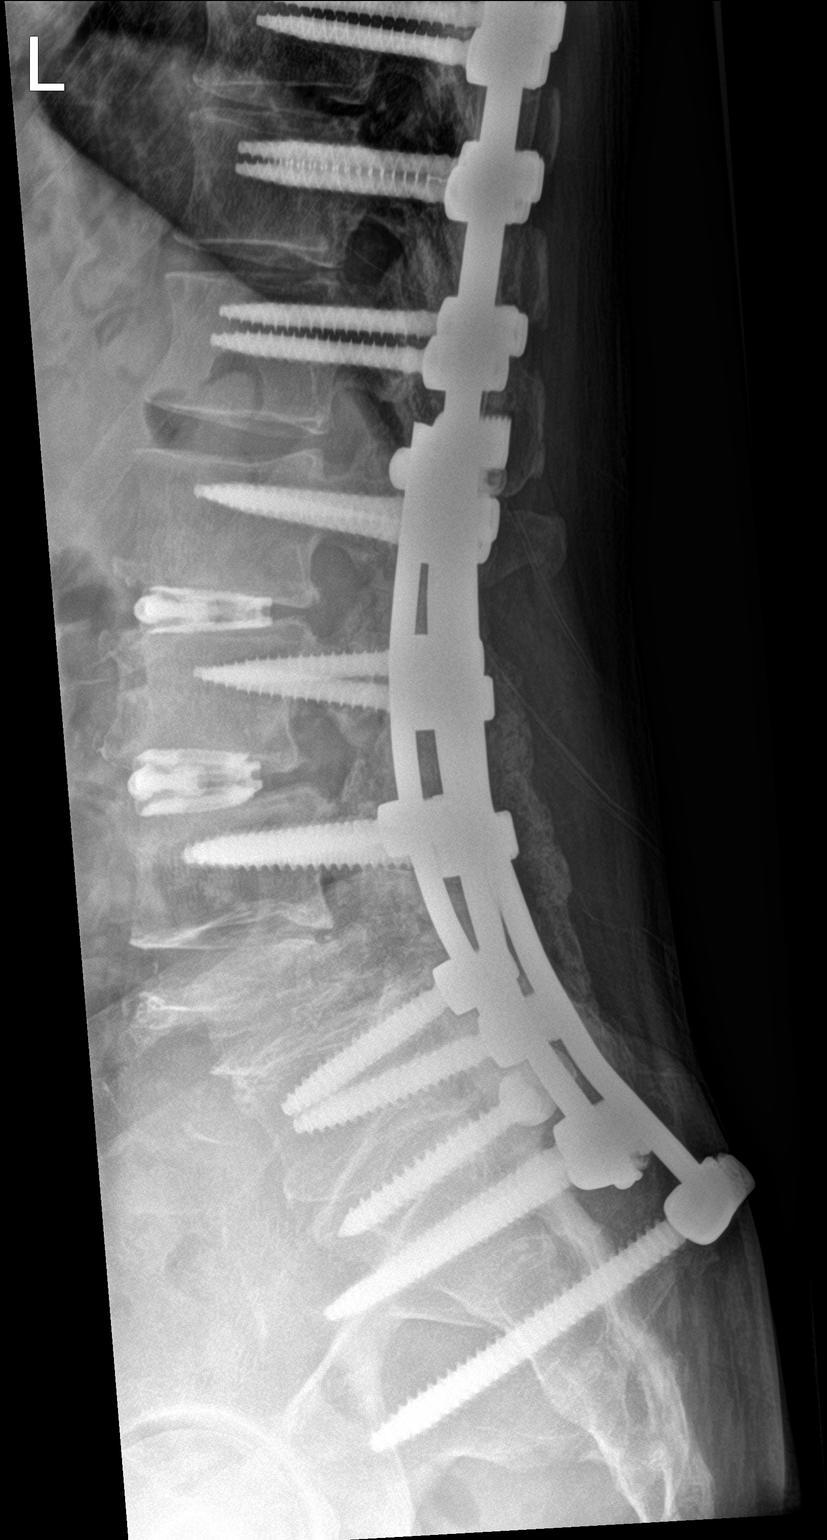

[4 of 4 positions shown; findings below may reference images not displayed]

FINDINGS: Stable L4 compression fracture deformity with approximately 40% loss
of height. Grade 1 anterolisthesis of L3 on L4, unchanged.

Posterior thoracolumbar spine fixation hardware extending to the
sacrum, without evidence of complication.

Visualized bony pelvis appears intact.
IMPRESSION: Stable L4 compression fracture deformity. Stable L3-4 alignment.
Stable surgical hardware.

## 2021-09-12 NOTE — ED Triage Notes (Signed)
Presents to ED by POV for (R)lower back, (R)knee pain. States he was push mowing lawn yesterday when pain started. Ambulatory with steady gait. States that "knot" to (R)lower back is new. Firm, raised area noted. No recent falls. No loss of bowel, bladder control.  ?

## 2021-09-13 ENCOUNTER — Emergency Department
Admission: EM | Admit: 2021-09-13 | Discharge: 2021-09-13 | Disposition: A | Discharge status: Home / Self Care | Payer: Medicare Other | Attending: Emergency Medicine | Admitting: Emergency Medicine

## 2021-09-13 DIAGNOSIS — S8391XA Sprain of unspecified site of right knee, initial encounter: Secondary | ICD-10-CM

## 2021-09-13 MED ORDER — ONDANSETRON 4 MG PO TBDP
4.0000 mg | ORAL_TABLET | Freq: Once | ORAL | Status: AC
  Administered 2021-09-13: 4 mg via ORAL
  Filled 2021-09-13: qty 1

## 2021-09-13 MED ORDER — HYDROCODONE-ACETAMINOPHEN 5-325 MG PO TABS
2.0000 | ORAL_TABLET | Freq: Once | ORAL | Status: AC
  Administered 2021-09-13: 2 via ORAL
  Filled 2021-09-13: qty 2

## 2021-09-13 MED ORDER — ONDANSETRON 4 MG PO TBDP: 4.0000 mg | ORAL_TABLET | Freq: Four times a day (QID) | ORAL | 0 refills | Status: DC | PRN

## 2021-09-13 MED ORDER — HYDROCODONE-ACETAMINOPHEN 5-325 MG PO TABS: 2.0000 | ORAL_TABLET | Freq: Four times a day (QID) | ORAL | 0 refills | Status: DC | PRN

## 2021-09-13 NOTE — ED Provider Notes (Signed)
? ?[facility] ?Provider Note ? ? ? Event Date/Time  ? First MD Initiated Contact with Patient 09/13/21 0247   ?  (approximate) ? ? ?History  ? ?Back Pain and Knee Pain ? ? ?HPI ? ?Earl Murray is a 66 y.o. male with history of hypertension, chronic back pain who presents to the emergency department with complaints of right knee pain and swelling.  States he was mowing the lawn today and stepped in several holes twisting his knee but never fell to the ground.  Has been able to ambulate but states he is having pain.  Has previously been on narcotic pain medicine for chronic back pain but states he has not had this prescribed to him in several weeks.  He is not on a pain contract.  He denies any new injury to his back.  No numbness, tingling, weakness, bowel or bladder incontinence, urinary retention, fever.  He states he noticed a "knot" over his SI joint today.  Has had previous lumbar spine surgery. ? ? ?History provided by patient. ? ? ? ?Past Medical History:  ?Diagnosis Date  ? Hypertension   ? Neck injury   ? ? ?Past Surgical History:  ?Procedure Laterality Date  ? BACK SURGERY    ? CHOLECYSTECTOMY    ? ELBOW SURGERY    ? ERCP N/A 10/27/2019  ? Procedure: ENDOSCOPIC RETROGRADE CHOLANGIOPANCREATOGRAPHY (ERCP);  Surgeon: Lucilla Lame, MD;  Location: [facility] ENDOSCOPY;  Service: Endoscopy;  Laterality: N/A;  ? FOOT SURGERY    ? HERNIA REPAIR    ? 4 hernia repairs  ? ? ?MEDICATIONS:  ?Prior to Admission medications   ?Medication Sig Start Date End Date Taking? Authorizing Provider  ?HYDROcodone-acetaminophen (NORCO/VICODIN) 5-325 MG tablet Take 2 tablets by mouth every 6 (six) hours as needed. 09/13/21  Yes Tychelle Purkey, Delice Bison, DO  ?ondansetron (ZOFRAN-ODT) 4 MG disintegrating tablet Take 1 tablet (4 mg total) by mouth every 6 (six) hours as needed for nausea or vomiting. 09/13/21  Yes Kalin Amrhein, Cyril Mourning N, DO  ?amLODipine (NORVASC) 10 MG tablet Take 10 mg by mouth daily.     [provider]  ?apixaban  (ELIQUIS) 5 MG TABS tablet Take 1 tablet (5 mg total) by mouth 2 (two) times daily. 03/25/21   Algernon Huxley, MD  ?atorvastatin (LIPITOR) 20 MG tablet TAKE ONE-HALF TABLET BY MOUTH AT BEDTIME FOR CHOLESTEROL 05/23/20   [provider]  ?baclofen (LIORESAL) 10 MG tablet 1-2 tid for back muscle spasm 08/15/21   Rodriguez-Southworth, Sunday Spillers, PA-C  ?chlorthalidone (HYGROTON) 25 MG tablet TAKE ONE TABLET BY MOUTH EVERY DAY FOR BLOOD PRESSURE INSTEAD OF HYDROCHLOROTHIAZIDE 12/03/20   [provider]  ?dicyclomine (BENTYL) 10 MG capsule Take 1 capsule (10 mg total) by mouth 3 (three) times daily as needed for up to 14 days for spasms. or abdominal pain 12/13/20 12/27/20  Hinda Kehr, MD  ?fexofenadine [facility] ALLERGY) 60 MG tablet Take 1 tablet (60 mg total) by mouth daily. For day time drainage 12/15/20   Rodriguez-Southworth, Sunday Spillers, PA-C  ?fluticasone (FLONASE) 50 MCG/ACT nasal spray Place 2 sprays into both nostrils daily. At night time for stuffy nose 12/15/20   Rodriguez-Southworth, Sunday Spillers, PA-C  ?hydrochlorothiazide (HYDRODIURIL) 25 MG tablet Take 1 tablet (25 mg total) by mouth daily. 08/15/18   Coral Spikes, DO  ?mirtazapine (REMERON) 7.5 MG tablet  11/25/20   [provider]  ?omeprazole (PRILOSEC OTC) 20 MG tablet Take 1 tablet (20 mg total) by mouth daily. 12/13/20 12/13/21  Karma Greaser,  Tommi Rumps, MD  ?Oxycodone HCl 10 MG TABS Take by mouth.    [provider]  ?pantoprazole (PROTONIX) 40 MG tablet TAKE ONE TABLET BY MOUTH EVERY DAY TO CONTROL STOMACH ACID TAKE 30 MINUTES BEFORE THE LARGES MEAL OF THE DAY 12/03/20   [provider]  ?sucralfate (CARAFATE) 1 g tablet Take 1 tablet (1 g total) by mouth 4 (four) times daily as needed (for abdominal discomfort, nausea, and/or vomiting). 12/13/20   Hinda Kehr, MD  ?venlafaxine XR (EFFEXOR-XR) 37.5 MG 24 hr capsule  11/13/20   [provider]  ?gabapentin (NEURONTIN) 300 MG capsule gabapentin 300 mg capsule ? one tab bid-tid   03/01/19  [provider]  ?ipratropium (ATROVENT) 0.06 % nasal spray Place 2 sprays into both nostrils 4 (four) times daily as needed for rhinitis. 10/22/19 03/16/20  Karen Kitchens, NP  ?levocetirizine (XYZAL) 5 MG tablet Take 1 tablet (5 mg total) by mouth every evening. 09/30/19 03/16/20  Coral Spikes, DO  ? ? ?Physical Exam  ? ?Triage Vital Signs: ?ED Triage Vitals  ?Enc Vitals Group  ?   BP 09/12/21 2300 (!) 176/90  ?   Pulse Rate 09/12/21 2300 87  ?   Resp 09/12/21 2300 18  ?   Temp 09/12/21 2300 98.5 ?F (36.9 ?C)  ?   Temp Source 09/12/21 2300 Oral  ?   SpO2 09/12/21 2300 98 %  ?   Weight 09/12/21 2300 200 lb (90.7 kg)  ?   Height 09/12/21 2300 '6\' 5"'$  (1.956 m)  ?   Head Circumference --   ?   Peak Flow --   ?   Pain Score 09/12/21 2259 9  ?   Pain Loc --   ?   Pain Edu? --   ?   Excl. in [place]? --   ? ? ?Most recent vital signs: ?Vitals:  ? 09/12/21 2300 09/13/21 0303  ?BP: (!) 176/90 (!) 162/86  ?Pulse: 87 85  ?Resp: 18 18  ?Temp: 98.5 ?F (36.9 ?C)   ?SpO2: 98% 100%  ? ? ? ?CONSTITUTIONAL: Alert and oriented and responds appropriately to questions. Well-appearing; well-nourished; GCS 15 ?HEAD: Normocephalic; atraumatic ?EYES: Conjunctivae clear, PERRL, EOMI ?ENT: normal nose; no rhinorrhea; moist mucous membranes; pharynx without lesions noted; no dental injury; no septal hematoma, no epistaxis; no facial deformity or bony tenderness ?NECK: Supple, no midline spinal tenderness, step-off or deformity; trachea midline ?CARD: RRR; S1 and S2 appreciated; no murmurs, no clicks, no rubs, no gallops ?RESP: Normal chest excursion without splinting or tachypnea; breath sounds clear and equal bilaterally; no wheezes, no rhonchi, no rales; no hypoxia or respiratory distress ?CHEST:  chest wall stable, no crepitus or ecchymosis or deformity, nontender to palpation; no flail chest ?ABD/GI: Normal bowel sounds; non-distended; soft, non-tender, no rebound, no guarding; no ecchymosis or other lesions noted ?PELVIS:   stable, nontender to palpation ?BACK:  The back appears normal; no midline spinal tenderness, step-off or deformity, patient has some tenderness over the right SI joint without soft tissue swelling, ecchymosis, redness, deformity, rash or other lesions; previous surgical scar noted to the midline of the lumbar spine ?EXT: Patient has some anterior right knee tenderness and some mild soft tissue swelling over the prepatellar area.  There is no joint effusion present.  No ligamentous laxity.  2+ DP pulse.  Compartments soft.  No calf tenderness or calf swelling.  Normal sensation throughout the right leg. ?SKIN: Normal color for age and race; warm ?NEURO: No facial  asymmetry, normal speech, moving all extremities equally ? ?ED Results / Procedures / Treatments  ? ?LABS: ?(all labs ordered are listed, but only abnormal results are displayed) ?Labs Reviewed - No data to display ? ? ?EKG: ? ? ?RADIOLOGY: ?My personal review and interpretation of imaging: Lumbar spine x-ray shows no acute traumatic injury.  X-ray of the right knee shows no fracture or dislocation. ? ?I have personally reviewed all radiology reports. ?DG Lumbar Spine Complete ? ?Result Date: 09/12/2021 ?CLINICAL DATA:  Pain EXAM: LUMBAR SPINE - COMPLETE 4+ VIEW COMPARISON:  Lumbar spine radiographs dated 08/17/2021. CT lumbar spine dated 07/16/2021. FINDINGS: Stable L4 compression fracture deformity with approximately 40% loss of height. Grade 1 anterolisthesis of L3 on L4, unchanged. Posterior thoracolumbar spine fixation hardware extending to the sacrum, without evidence of complication. Visualized bony pelvis appears intact. IMPRESSION: Stable L4 compression fracture deformity. Stable L3-4 alignment. Stable surgical hardware. Electronically Signed   By: Julian Hy M.D.   On: 09/12/2021 23:48  ? ?DG Knee Complete 4 Views Right ? ?Result Date: 09/12/2021 ?CLINICAL DATA:  Pain EXAM: RIGHT KNEE - COMPLETE 4+ VIEW COMPARISON:  None. FINDINGS: No  fracture or dislocation is seen. Very mild degenerative changes with chondrocalcinosis in the medial and lateral compartments. Visualized soft tissues are within normal limits. No suprapatellar knee joint effusion.

## 2021-09-13 NOTE — ED Notes (Signed)
Patient provided discharge instructions, script info, and follow-up info. Patient verbalized understanding. Since there were no knee sleeves available, an ace wrap was used instead per provider order. Patient was given instructions on its use. Patient ambulated with a steady gait out to the waiting room. ?

## 2021-09-13 NOTE — Discharge Instructions (Signed)

## 2021-10-12 ENCOUNTER — Encounter: Payer: Self-pay | Admitting: Emergency Medicine

## 2021-10-12 ENCOUNTER — Other Ambulatory Visit: Payer: Self-pay

## 2021-10-12 ENCOUNTER — Emergency Department: Payer: Medicare Other

## 2021-10-12 ENCOUNTER — Inpatient Hospital Stay
Admission: EM | Admit: 2021-10-12 | Discharge: 2021-10-15 | DRG: 165 | Disposition: A | Discharge status: Home / Self Care | Payer: Medicare Other | Attending: Student in an Organized Health Care Education/Training Program | Admitting: Student in an Organized Health Care Education/Training Program

## 2021-10-12 DIAGNOSIS — F1490 Cocaine use, unspecified, uncomplicated: Secondary | ICD-10-CM | POA: Diagnosis present

## 2021-10-12 DIAGNOSIS — I10 Essential (primary) hypertension: Secondary | ICD-10-CM | POA: Diagnosis present

## 2021-10-12 DIAGNOSIS — Z86718 Personal history of other venous thrombosis and embolism: Secondary | ICD-10-CM

## 2021-10-12 DIAGNOSIS — G8929 Other chronic pain: Secondary | ICD-10-CM | POA: Diagnosis present

## 2021-10-12 DIAGNOSIS — K2100 Gastro-esophageal reflux disease with esophagitis, without bleeding: Secondary | ICD-10-CM | POA: Diagnosis present

## 2021-10-12 DIAGNOSIS — I160 Hypertensive urgency: Secondary | ICD-10-CM

## 2021-10-12 DIAGNOSIS — Z888 Allergy status to other drugs, medicaments and biological substances status: Secondary | ICD-10-CM

## 2021-10-12 DIAGNOSIS — K219 Gastro-esophageal reflux disease without esophagitis: Secondary | ICD-10-CM | POA: Diagnosis not present

## 2021-10-12 DIAGNOSIS — Z79891 Long term (current) use of opiate analgesic: Secondary | ICD-10-CM | POA: Diagnosis not present

## 2021-10-12 DIAGNOSIS — Z79899 Other long term (current) drug therapy: Secondary | ICD-10-CM

## 2021-10-12 DIAGNOSIS — F32A Depression, unspecified: Secondary | ICD-10-CM | POA: Diagnosis present

## 2021-10-12 DIAGNOSIS — M79606 Pain in leg, unspecified: Secondary | ICD-10-CM

## 2021-10-12 DIAGNOSIS — Z87891 Personal history of nicotine dependence: Secondary | ICD-10-CM

## 2021-10-12 DIAGNOSIS — E785 Hyperlipidemia, unspecified: Secondary | ICD-10-CM | POA: Diagnosis present

## 2021-10-12 DIAGNOSIS — Z885 Allergy status to narcotic agent status: Secondary | ICD-10-CM

## 2021-10-12 DIAGNOSIS — I2699 Other pulmonary embolism without acute cor pulmonale: Principal | ICD-10-CM | POA: Diagnosis present

## 2021-10-12 DIAGNOSIS — Z8249 Family history of ischemic heart disease and other diseases of the circulatory system: Secondary | ICD-10-CM | POA: Diagnosis not present

## 2021-10-12 DIAGNOSIS — I2609 Other pulmonary embolism with acute cor pulmonale: Secondary | ICD-10-CM | POA: Diagnosis not present

## 2021-10-12 LAB — BASIC METABOLIC PANEL
Anion gap: 7 (ref 5–15)
BUN: 16 mg/dL (ref 8–23)
CO2: 28 mmol/L (ref 22–32)
Calcium: 9.5 mg/dL (ref 8.9–10.3)
Chloride: 104 mmol/L (ref 98–111)
Creatinine, Ser: 1.22 mg/dL (ref 0.61–1.24)
GFR, Estimated: >60 mL/min (ref >60)
Glucose, Bld: 102 mg/dL — ABNORMAL HIGH (ref 70–99)
Potassium: 4.2 mmol/L (ref 3.5–5.1)
Sodium: 139 mmol/L (ref 135–145)

## 2021-10-12 LAB — URINALYSIS, ROUTINE W REFLEX MICROSCOPIC
Bilirubin Urine: NEGATIVE
Glucose, UA: NEGATIVE mg/dL
Hgb urine dipstick: NEGATIVE
Ketones, ur: NEGATIVE mg/dL
Leukocytes,Ua: NEGATIVE
Nitrite: NEGATIVE
Protein, ur: NEGATIVE mg/dL
Specific Gravity, Urine: 1.025 (ref 1.005–1.030)
pH: 5 (ref 5.0–8.0)

## 2021-10-12 LAB — HEPARIN LEVEL (UNFRACTIONATED): Heparin Unfractionated: <0.1 IU/mL — ABNORMAL LOW (ref 0.30–0.70)

## 2021-10-12 LAB — CBC
HCT: 42.8 % (ref 39.0–52.0)
Hemoglobin: 14.2 g/dL (ref 13.0–17.0)
MCH: 30.3 pg (ref 26.0–34.0)
MCHC: 33.2 g/dL (ref 30.0–36.0)
MCV: 91.3 fL (ref 80.0–100.0)
Platelets: 295 10*3/uL (ref 150–400)
RBC: 4.69 MIL/uL (ref 4.22–5.81)
RDW: 17.7 % — ABNORMAL HIGH (ref 11.5–15.5)
WBC: 14 10*3/uL — ABNORMAL HIGH (ref 4.0–10.5)
nRBC: 0 % (ref 0.0–0.2)

## 2021-10-12 LAB — D-DIMER, QUANTITATIVE: D-Dimer, Quant: 1.05 ug/mL-FEU — ABNORMAL HIGH (ref 0.00–0.50)

## 2021-10-12 LAB — PROTIME-INR
INR: 0.9 (ref 0.8–1.2)
Prothrombin Time: 12.5 seconds (ref 11.4–15.2)

## 2021-10-12 LAB — TROPONIN I (HIGH SENSITIVITY)
Troponin I (High Sensitivity): 6 ng/L (ref <18)
Troponin I (High Sensitivity): 7 ng/L (ref <18)

## 2021-10-12 LAB — APTT: aPTT: 24 seconds (ref 24–36)

## 2021-10-12 IMAGING — CR DG CHEST 2V
1 series · 2 of 2 positions shown · non-contrast
Comparison: None Available.

CLINICAL DATA: Shortness of breath, left rib and flank pain

EXAM:
CHEST - 2 VIEW

[Series 1: dg chest 2 view · 0.14mm/px · 2 of 2 slices shown]
[im 1/2]
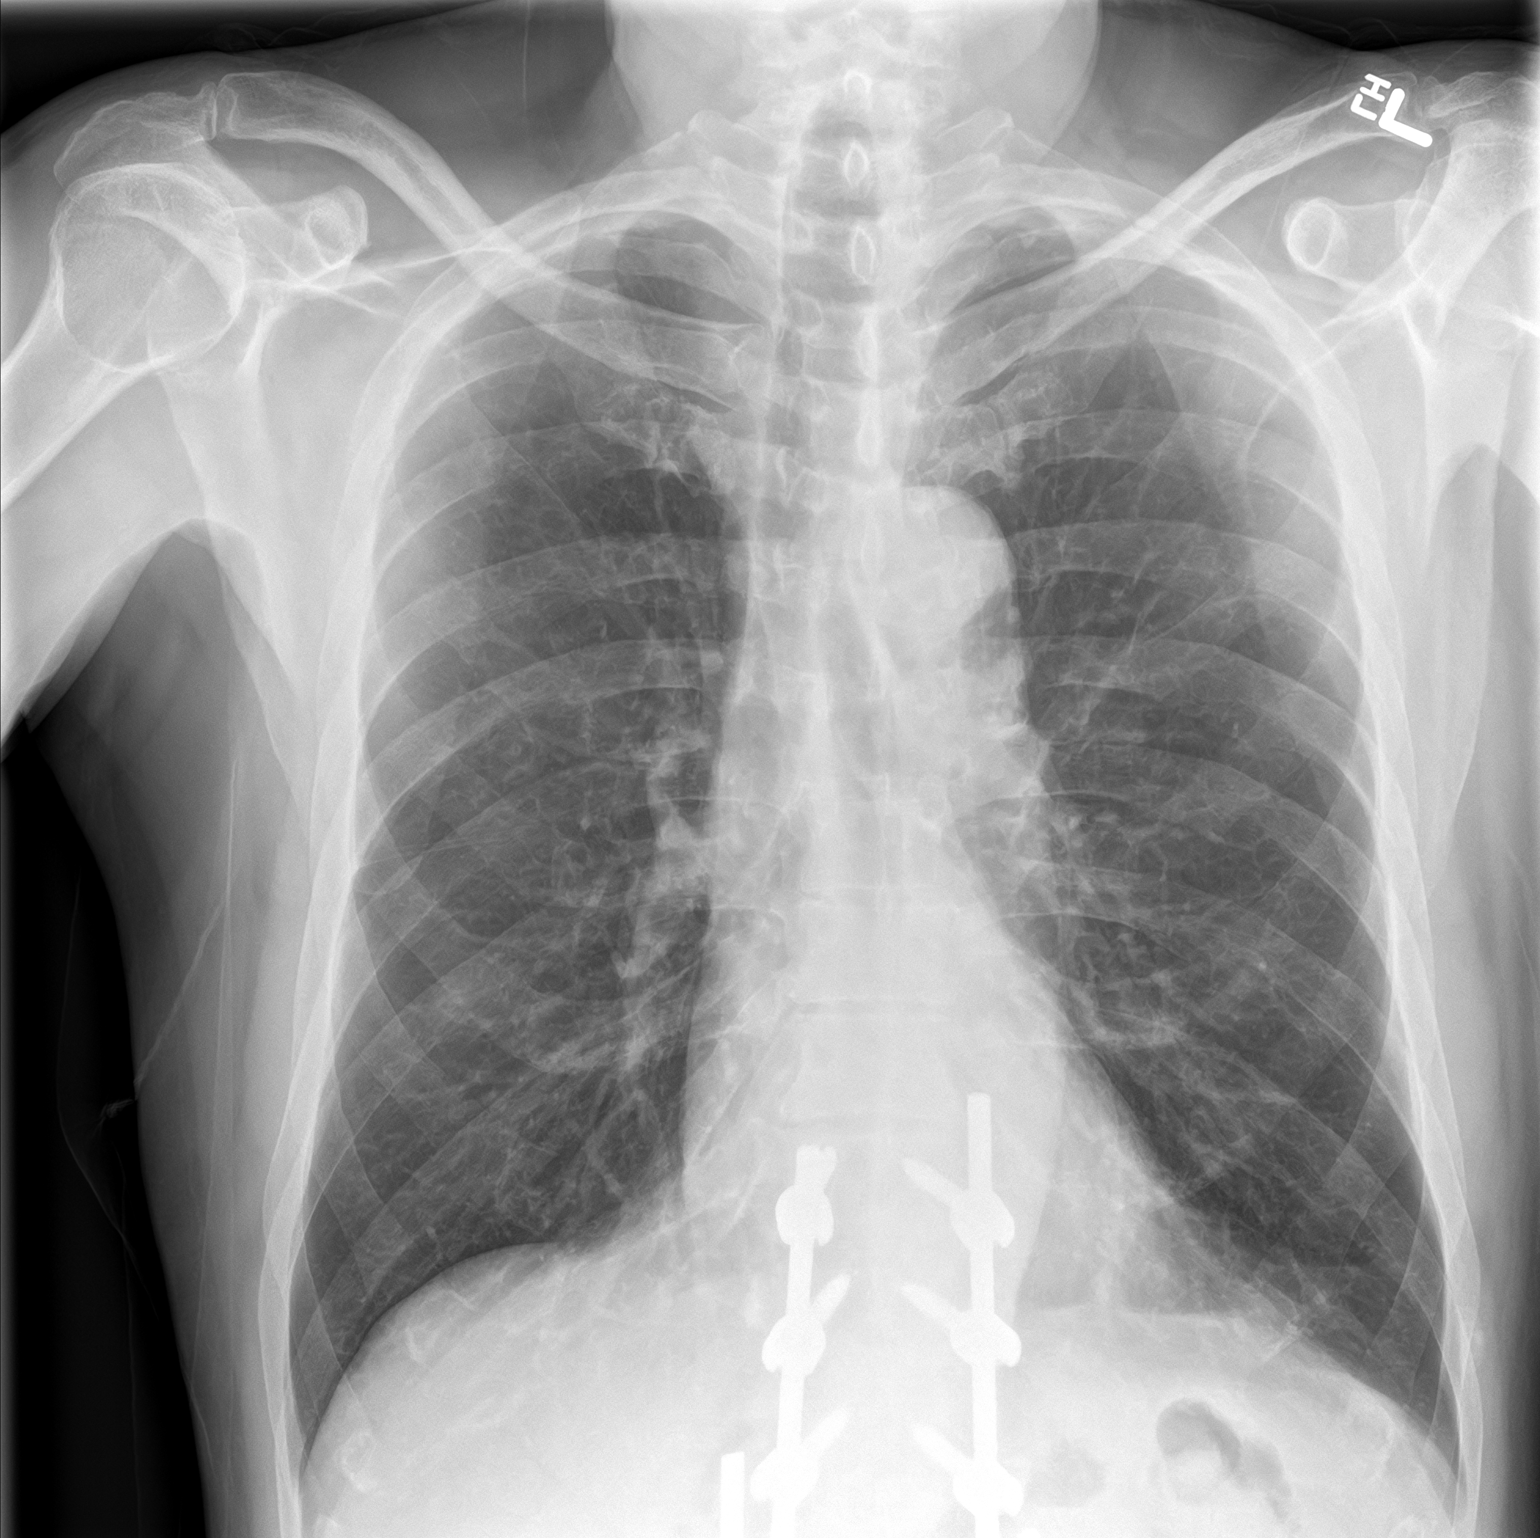
[im 2/2]
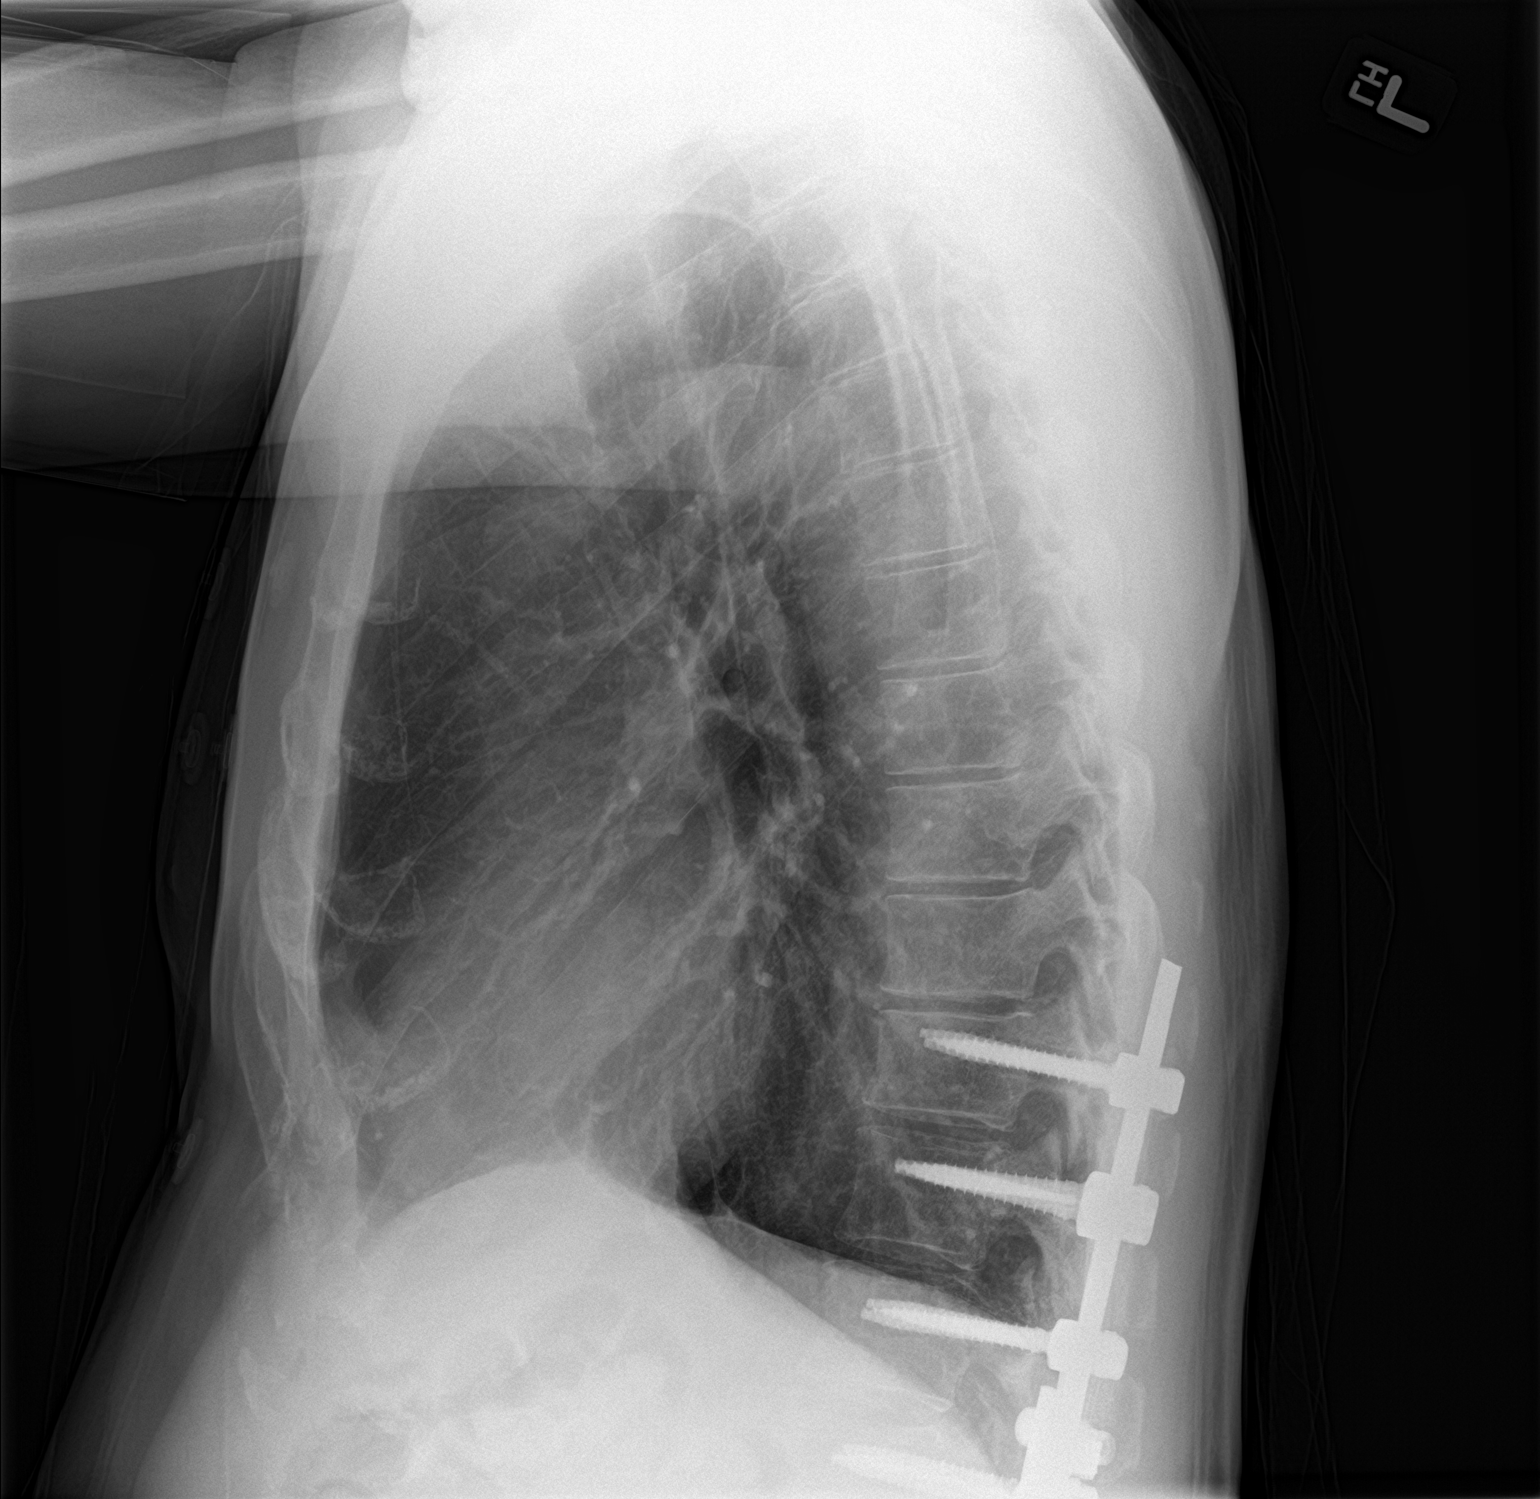

[2 of 2 positions shown; findings below may reference images not displayed]

FINDINGS: Lungs are hyperinflated, suspect component of background
COPD/emphysema. Normal heart size and vascularity. Negative for
pneumonia, edema, effusion or pneumothorax. Trachea midline. Aorta
atherosclerotic. No acute osseous finding. Lower thoracic fusion
hardware partially imaged.
IMPRESSION: Hyperinflation.  No superimposed acute process by plain radiography.

## 2021-10-12 IMAGING — CT CT ANGIO CHEST
2 of 6 series · 17 of 46 positions shown · IV contrast (APPLIED)
Comparison: CT abdomen pelvis [DATE], CT lumbar spine
[DATE]

CLINICAL DATA: Pulmonary embolism (PE) suspected, positive D-dimer;
Flank pain, kidney stone suspected



[Series 4: thins · axial · 0.70mm/px · z∈[-791,-524]mm · 14 of 420 slices shown]
[im 19/420  lung]
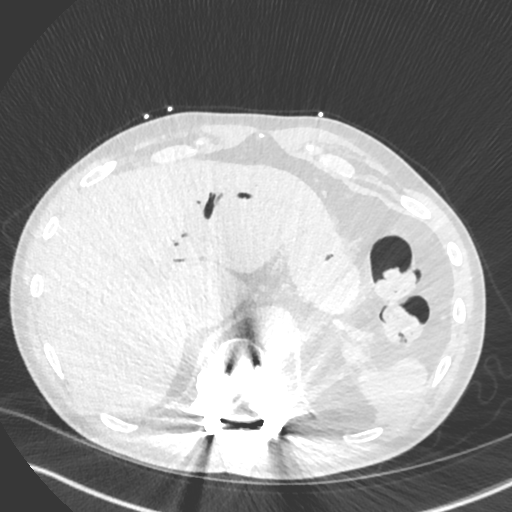
[im 55/420  soft-tissue]
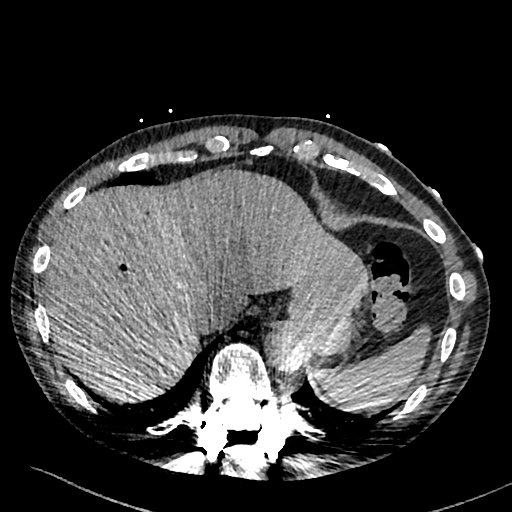
[im 73/420  lung]
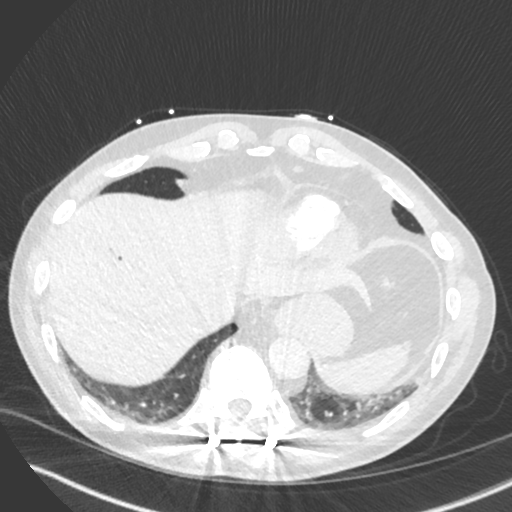
[im 110/420  soft-tissue]
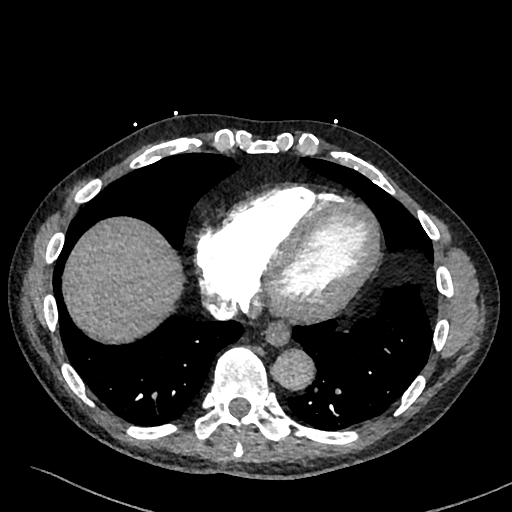
[im 146/420  lung]
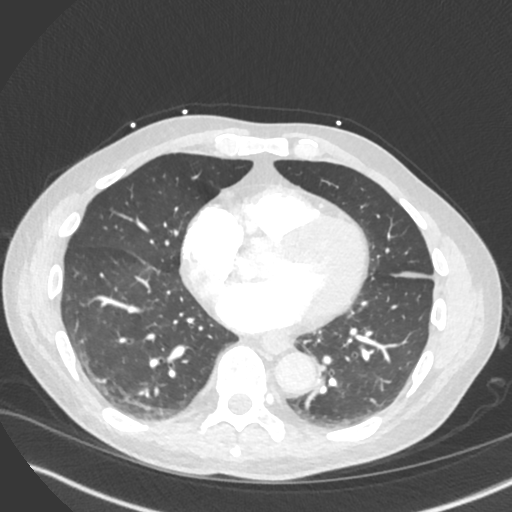
[im 164/420  soft-tissue]
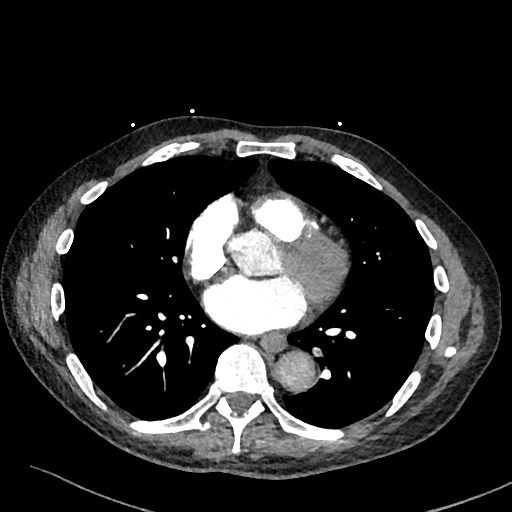
[im 201/420  lung]
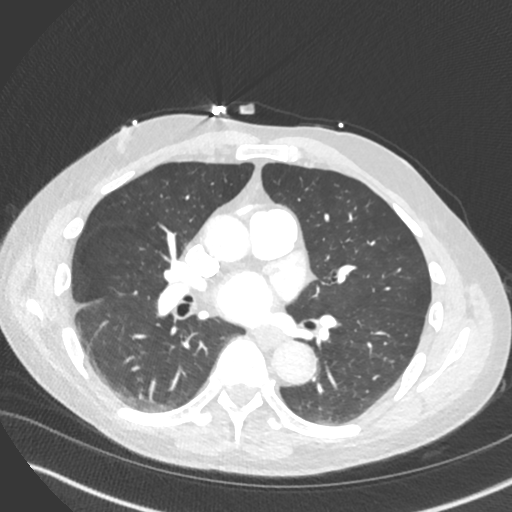
[im 219/420  soft-tissue]
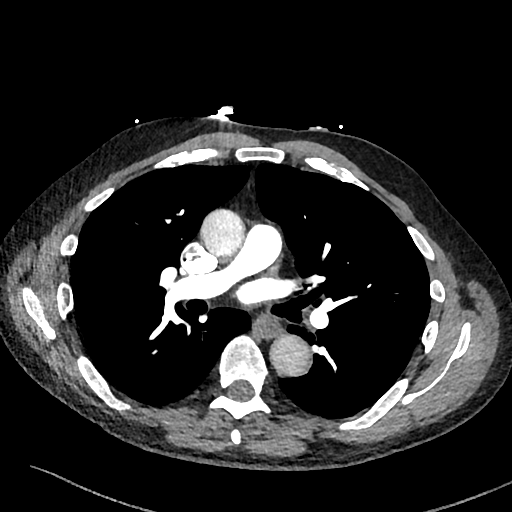
[im 256/420  lung]
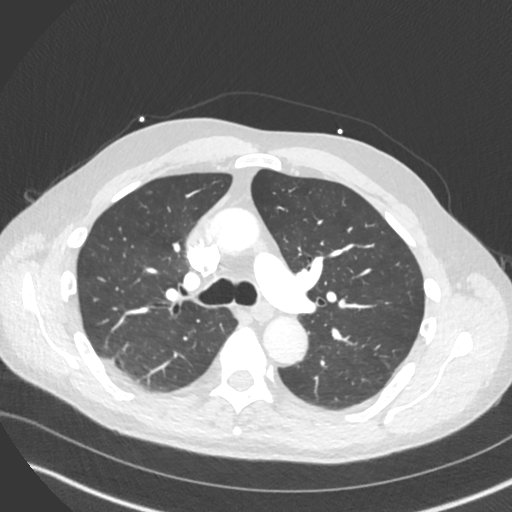
[im 274/420  soft-tissue]
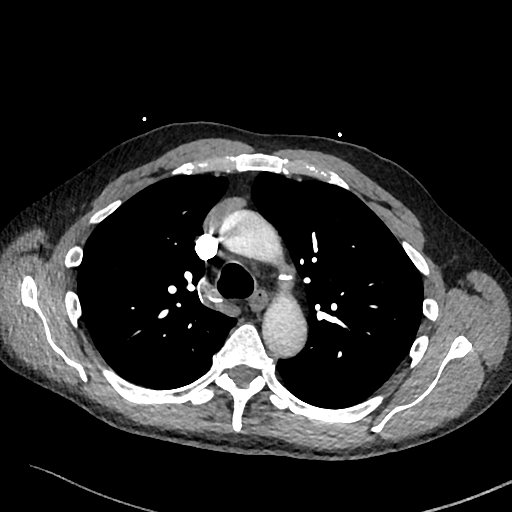
[im 310/420  lung]
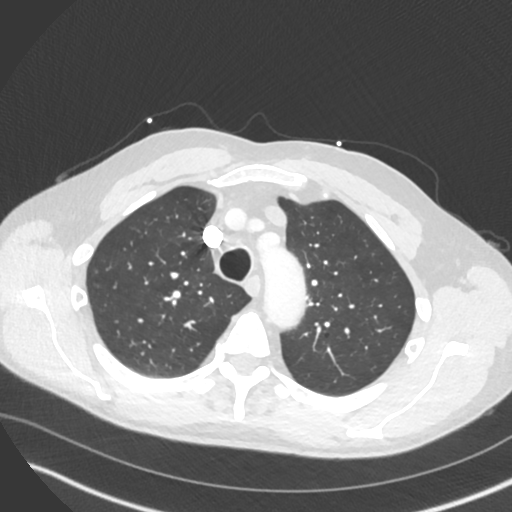
[im 347/420  soft-tissue]
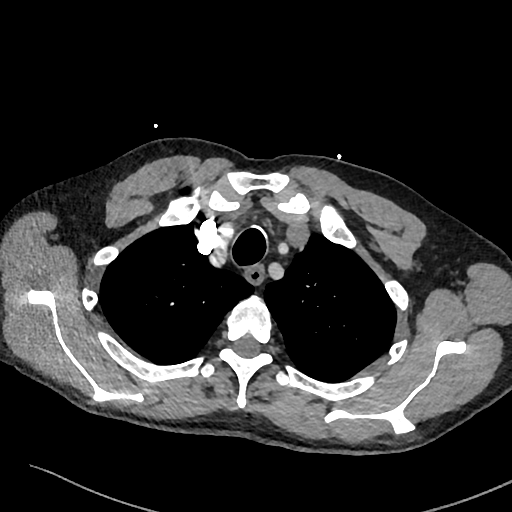
[im 365/420  lung]
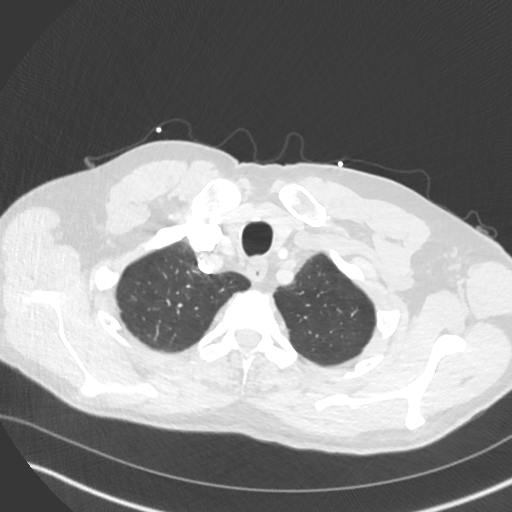
[im 401/420  soft-tissue]
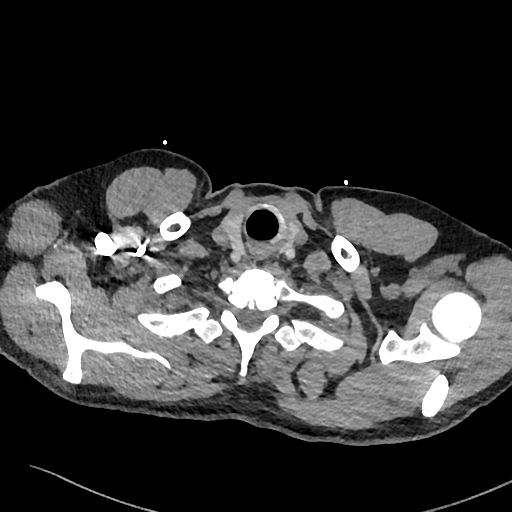

[Series 5: cor · coronal · 0.60mm/px · 3 of 130 slices shown]
[im 33/130  soft-tissue]
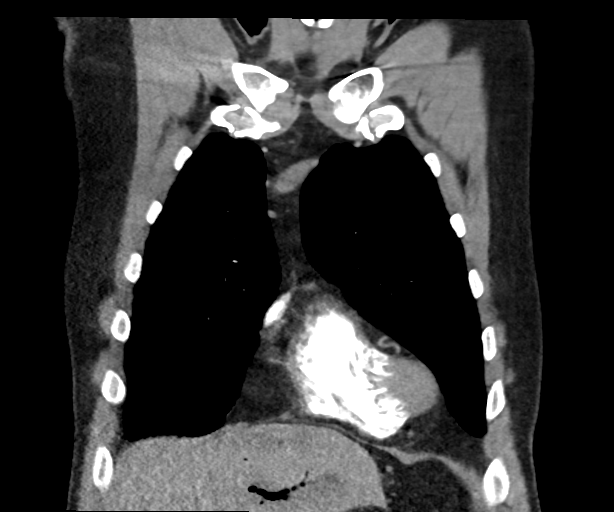
[im 65/130  soft-tissue]
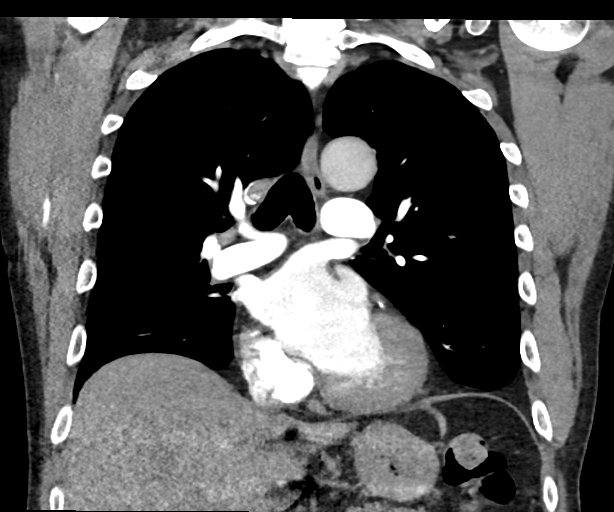
[im 97/130  soft-tissue]
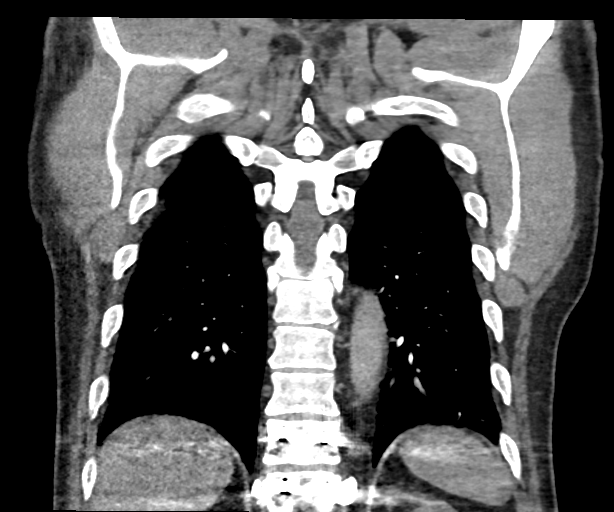

[17 of 46 positions shown; findings below may reference images not displayed]

RADIATION DOSE REDUCTION: This exam was performed according to the
departmental dose-optimization program which includes automated
exposure control, adjustment of the mA and/or kV according to
patient size and/or use of iterative reconstruction technique.

CONTRAST:  100mL OMNIPAQUE IOHEXOL 350 MG/ML SOLN
FINDINGS: CTA CHEST FINDINGS

Cardiovascular: Adequate opacification of the pulmonary arteries.
Filling defects involving segmental branch pulmonary arteries of the
left lower lobe (series 5, image 74; series 2, image 83). Additional
PE involving a segmental left upper lobe branch (series 2, image
60). No lobar or central pulmonary embolism. Elevated RV to LV ratio
of 1.2.

Heart size is normal. No pericardial effusion. Thoracic aorta is
nonaneurysmal. Scattered atherosclerotic calcifications of the aorta
and coronary arteries.

Mediastinum/Nodes: No enlarged mediastinal, hilar, or axillary lymph
nodes. Thyroid gland, trachea, and esophagus demonstrate no
significant findings.

Lungs/Pleura: Mild dependent subsegmental bibasilar atelectasis.
Lungs are otherwise clear. No pleural effusion or pneumothorax.

Musculoskeletal: No chest wall abnormality. No acute or significant
osseous findings.

Review of the MIP images confirms the above findings.

CT ABDOMEN and PELVIS FINDINGS

Hepatobiliary: Prior cholecystectomy. Persistent pneumobilia within
the liver. Chronically dilated common bile duct, also with
pneumobilia. No focal liver lesion identified.

Pancreas: Unremarkable. No pancreatic ductal dilatation or
surrounding inflammatory changes.

Spleen: Normal in size without focal abnormality.

Adrenals/Urinary Tract: Adrenal glands are unremarkable. Kidneys are
normal, without renal calculi, solid lesion, or hydronephrosis.
Bladder is unremarkable.

Stomach/Bowel: Stomach is within normal limits. No evidence of bowel
wall thickening, distention, or inflammatory changes.

Vascular/Lymphatic: Aortic atherosclerosis. No enlarged abdominal or
pelvic lymph nodes.

Reproductive: Prostate is unremarkable.

Other: No free fluid. No abdominopelvic fluid collection. No
pneumoperitoneum. No abdominal wall hernia.

Musculoskeletal: Extensive thoraco lumbar to bi-iliac spinal fusion.
Similar appearance of L4 vertebral body compression fracture.
Unchanged anterolisthesis of L3 on L4 and L2 on L3. Similar degree
of perihardware lucency adjacent to the screws at T10. No new or
acute bony findings.

Review of the MIP images confirms the above findings.
IMPRESSION: 1. Acute pulmonary emboli involving segmental branch pulmonary
arteries of the left lower lobe and left upper lobe. Elevated RV to
LV ratio of 1.2.
2. Lungs are clear.
3. No acute abdominopelvic process.
4. Additional chronic and/or incidental findings, as above.
5. Aortic atherosclerosis ([ZR]-[ZR]).

These results were called by telephone at the time of interpretation
on [DATE] at [DATE] to provider TRI , who verbally
acknowledged these results.

## 2021-10-12 IMAGING — CT CT ABD-PELV W/ CM
2 of 5 series · 14 of 46 positions shown, 16 images · IV contrast (agent unspecified)
Comparison: CT abdomen pelvis [DATE], CT lumbar spine
[DATE]

CLINICAL DATA: Pulmonary embolism (PE) suspected, positive D-dimer;
Flank pain, kidney stone suspected



[Series 5: abdomen 5.0 (person_name) · axial · 0.86mm/px · z∈[-1183,-723]mm · 11 of 112 slices shown, 13 images]
[im 10/112  soft-tissue]
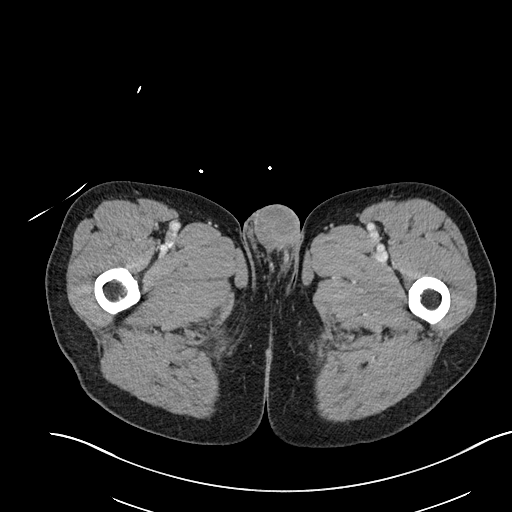
[im 10/112  bone]
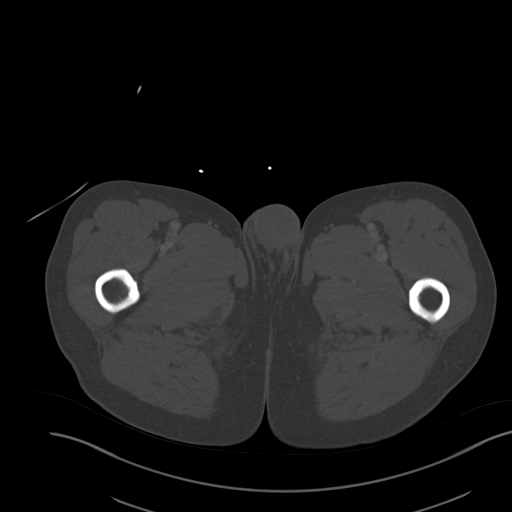
[im 19/112  soft-tissue]
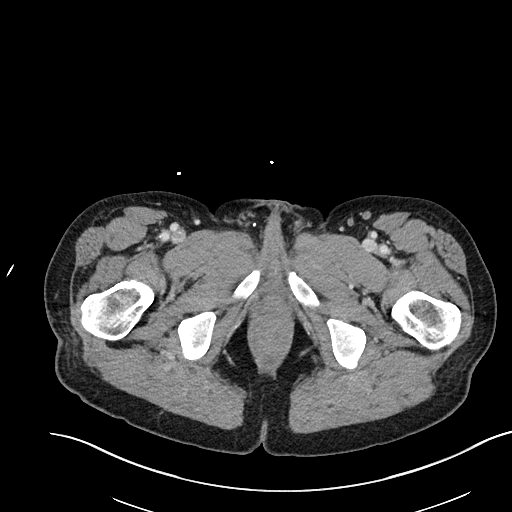
[im 28/112  soft-tissue]
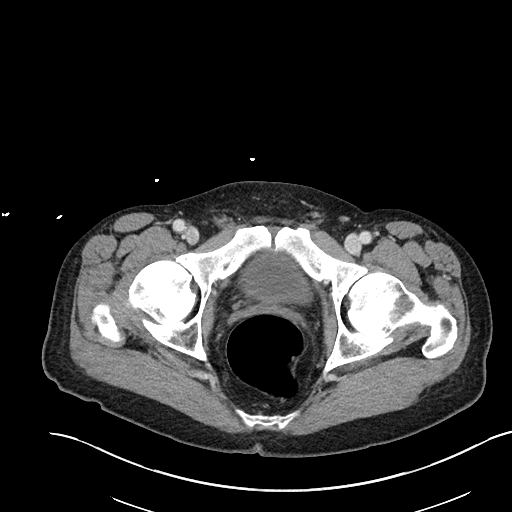
[im 38/112  soft-tissue]
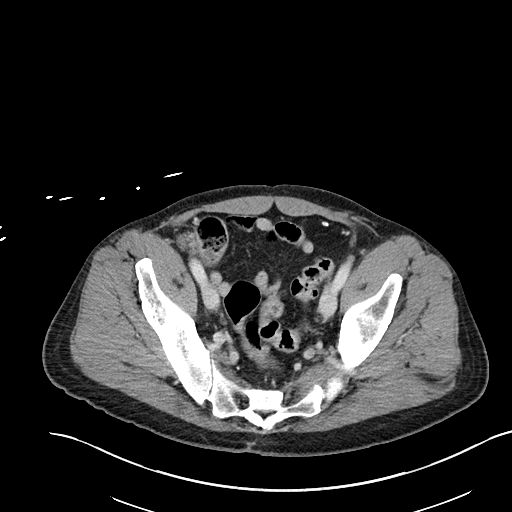
[im 47/112  soft-tissue]
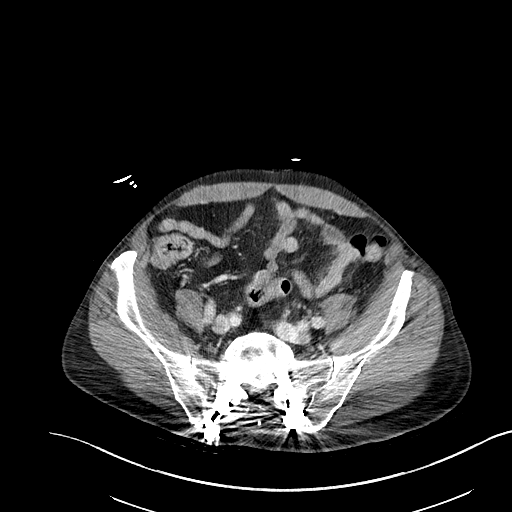
[im 56/112  soft-tissue]
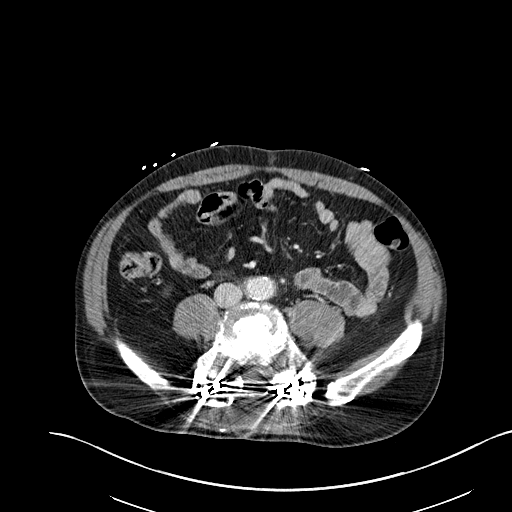
[im 65/112  soft-tissue]
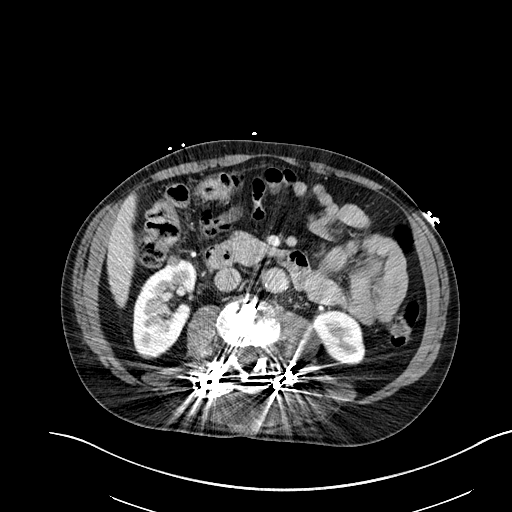
[im 75/112  soft-tissue]
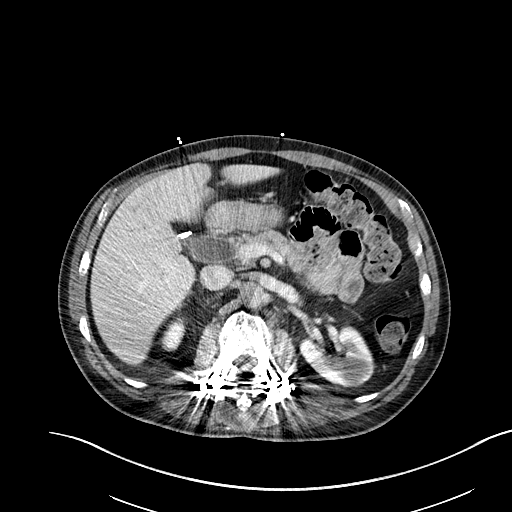
[im 84/112  soft-tissue]
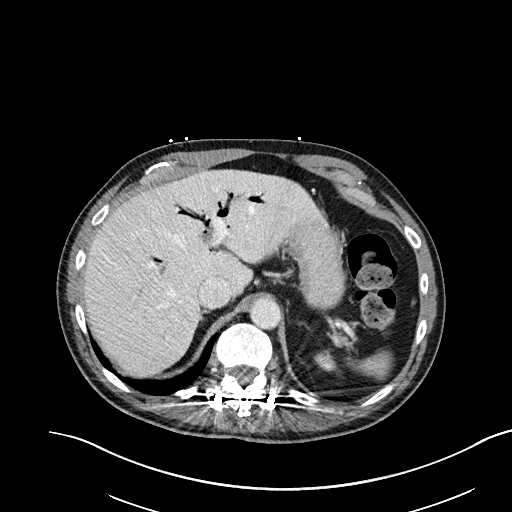
[im 84/112  bone]
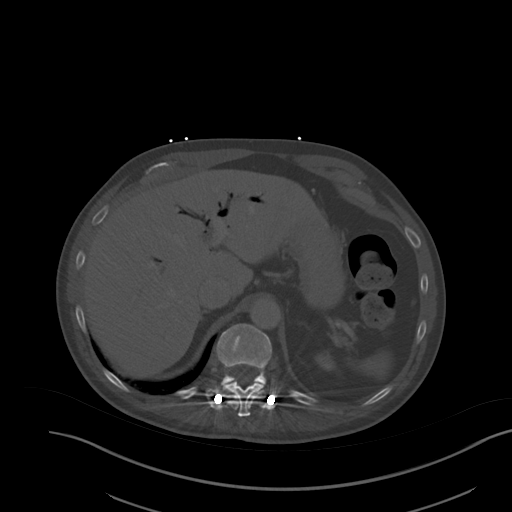
[im 93/112  soft-tissue]
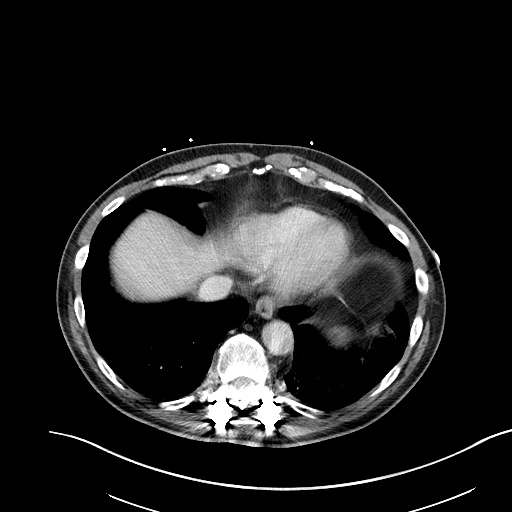
[im 102/112  soft-tissue]
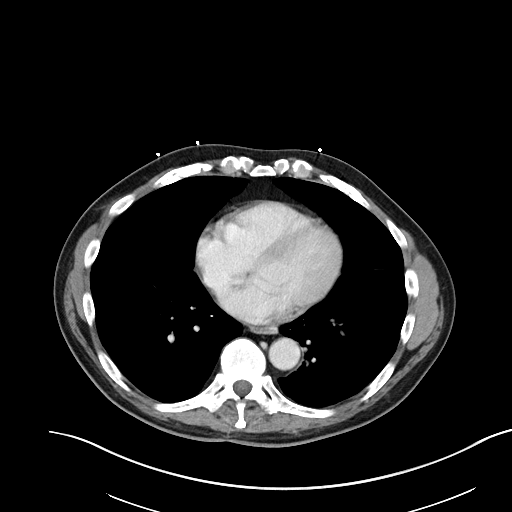

[Series 8: abdomen 3.0 (person_name) · coronal · 0.77mm/px · 3 of 92 slices shown]
[im 31/92  soft-tissue]
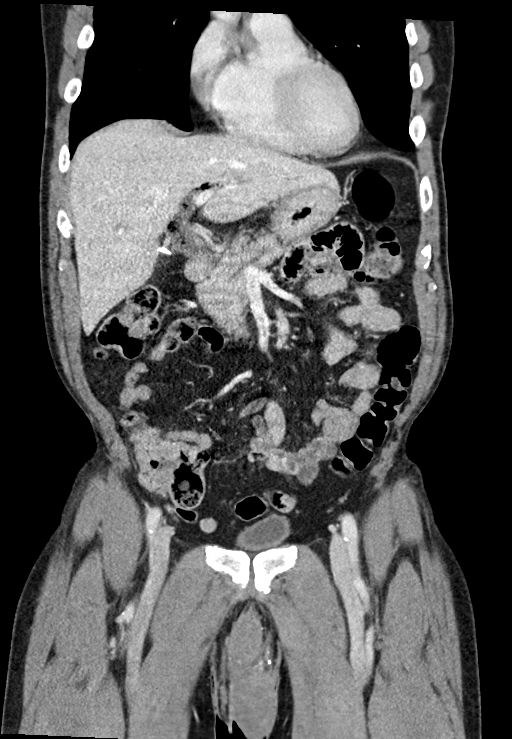
[im 41/92  soft-tissue]
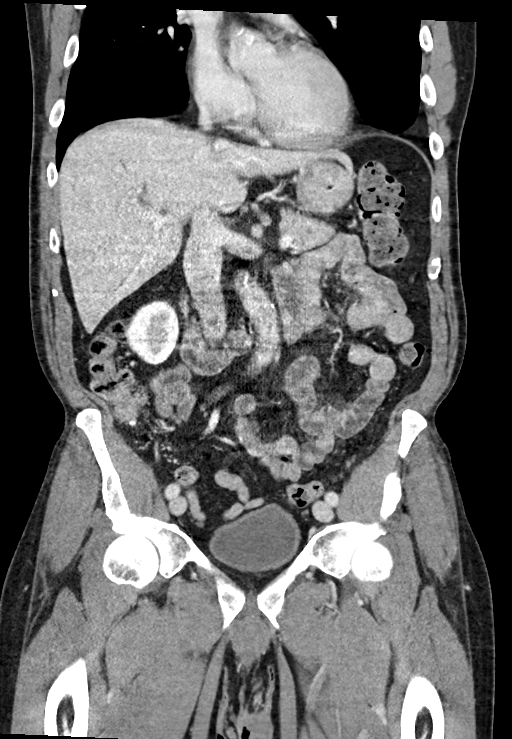
[im 51/92  soft-tissue]
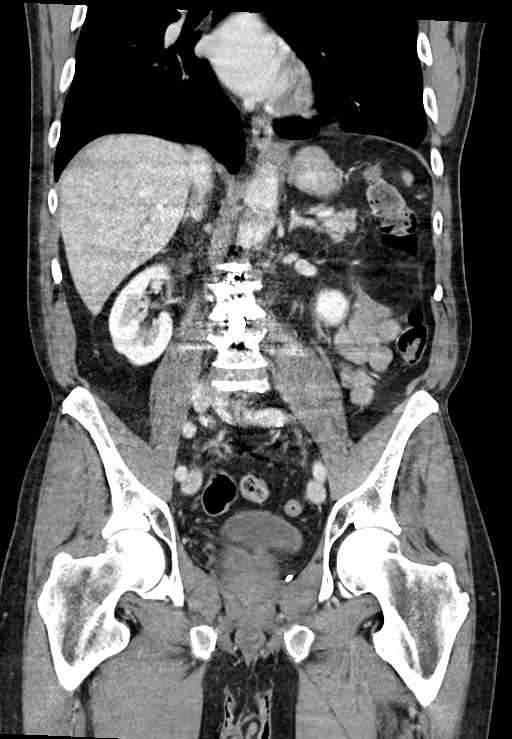

[14 of 46 positions shown; findings below may reference images not displayed]

RADIATION DOSE REDUCTION: This exam was performed according to the
departmental dose-optimization program which includes automated
exposure control, adjustment of the mA and/or kV according to
patient size and/or use of iterative reconstruction technique.

CONTRAST:  100mL OMNIPAQUE IOHEXOL 350 MG/ML SOLN
FINDINGS: CTA CHEST FINDINGS

Cardiovascular: Adequate opacification of the pulmonary arteries.
Filling defects involving segmental branch pulmonary arteries of the
left lower lobe (series 5, image 74; series 2, image 83). Additional
PE involving a segmental left upper lobe branch (series 2, image
60). No lobar or central pulmonary embolism. Elevated RV to LV ratio
of 1.2.

Heart size is normal. No pericardial effusion. Thoracic aorta is
nonaneurysmal. Scattered atherosclerotic calcifications of the aorta
and coronary arteries.

Mediastinum/Nodes: No enlarged mediastinal, hilar, or axillary lymph
nodes. Thyroid gland, trachea, and esophagus demonstrate no
significant findings.

Lungs/Pleura: Mild dependent subsegmental bibasilar atelectasis.
Lungs are otherwise clear. No pleural effusion or pneumothorax.

Musculoskeletal: No chest wall abnormality. No acute or significant
osseous findings.

Review of the MIP images confirms the above findings.

CT ABDOMEN and PELVIS FINDINGS

Hepatobiliary: Prior cholecystectomy. Persistent pneumobilia within
the liver. Chronically dilated common bile duct, also with
pneumobilia. No focal liver lesion identified.

Pancreas: Unremarkable. No pancreatic ductal dilatation or
surrounding inflammatory changes.

Spleen: Normal in size without focal abnormality.

Adrenals/Urinary Tract: Adrenal glands are unremarkable. Kidneys are
normal, without renal calculi, solid lesion, or hydronephrosis.
Bladder is unremarkable.

Stomach/Bowel: Stomach is within normal limits. No evidence of bowel
wall thickening, distention, or inflammatory changes.

Vascular/Lymphatic: Aortic atherosclerosis. No enlarged abdominal or
pelvic lymph nodes.

Reproductive: Prostate is unremarkable.

Other: No free fluid. No abdominopelvic fluid collection. No
pneumoperitoneum. No abdominal wall hernia.

Musculoskeletal: Extensive thoraco lumbar to bi-iliac spinal fusion.
Similar appearance of L4 vertebral body compression fracture.
Unchanged anterolisthesis of L3 on L4 and L2 on L3. Similar degree
of perihardware lucency adjacent to the screws at T10. No new or
acute bony findings.

Review of the MIP images confirms the above findings.
IMPRESSION: 1. Acute pulmonary emboli involving segmental branch pulmonary
arteries of the left lower lobe and left upper lobe. Elevated RV to
LV ratio of 1.2.
2. Lungs are clear.
3. No acute abdominopelvic process.
4. Additional chronic and/or incidental findings, as above.
5. Aortic atherosclerosis ([ZR]-[ZR]).

These results were called by telephone at the time of interpretation
on [DATE] at [DATE] to provider TRI , who verbally
acknowledged these results.

## 2021-10-12 MED ORDER — MORPHINE SULFATE (PF) 2 MG/ML IV SOLN
2.0000 mg | INTRAVENOUS | Status: DC | PRN
  Administered 2021-10-13 (×2): 2 mg via INTRAVENOUS
  Filled 2021-10-12 (×2): qty 1

## 2021-10-12 MED ORDER — KETOROLAC TROMETHAMINE 15 MG/ML IJ SOLN
15.0000 mg | Freq: Once | INTRAMUSCULAR | Status: AC
  Administered 2021-10-12: 15 mg via INTRAVENOUS
  Filled 2021-10-12: qty 1

## 2021-10-12 MED ORDER — HEPARIN (PORCINE) 25000 UT/250ML-% IV SOLN
1400.0000 [IU]/h | INTRAVENOUS | Status: DC
  Administered 2021-10-12: 1400 [IU]/h via INTRAVENOUS
  Filled 2021-10-12: qty 250

## 2021-10-12 MED ORDER — MORPHINE SULFATE (PF) 4 MG/ML IV SOLN
4.0000 mg | Freq: Once | INTRAVENOUS | Status: AC
  Administered 2021-10-12: 4 mg via INTRAVENOUS
  Filled 2021-10-12: qty 1

## 2021-10-12 MED ORDER — ACETAMINOPHEN 325 MG PO TABS
650.0000 mg | ORAL_TABLET | Freq: Four times a day (QID) | ORAL | Status: DC | PRN
Start: 2021-10-12 — End: 2021-10-15

## 2021-10-12 MED ORDER — MAGNESIUM HYDROXIDE 400 MG/5ML PO SUSP: 30.0000 mL | Freq: Every day | ORAL | Status: DC | PRN

## 2021-10-12 MED ORDER — TRAZODONE HCL 50 MG PO TABS
25.0000 mg | ORAL_TABLET | Freq: Every evening | ORAL | Status: DC | PRN
  Administered 2021-10-13 – 2021-10-14 (×2): 25 mg via ORAL
  Filled 2021-10-12 (×2): qty 1

## 2021-10-12 MED ORDER — ONDANSETRON HCL 4 MG/2ML IJ SOLN
4.0000 mg | Freq: Four times a day (QID) | INTRAMUSCULAR | Status: DC | PRN
Start: 2021-10-12 — End: 2021-10-15

## 2021-10-12 MED ORDER — OXYCODONE HCL ER 10 MG PO T12A
20.0000 mg | EXTENDED_RELEASE_TABLET | Freq: Two times a day (BID) | ORAL | Status: DC
Start: 2021-10-12 — End: 2021-10-12

## 2021-10-12 MED ORDER — ACETAMINOPHEN 650 MG RE SUPP
650.0000 mg | Freq: Four times a day (QID) | RECTAL | Status: DC | PRN
Start: 2021-10-12 — End: 2021-10-15

## 2021-10-12 MED ORDER — SODIUM CHLORIDE 0.9 % IV SOLN
INTRAVENOUS | Status: DC
  Administered 2021-10-13: 100 mL via INTRAVENOUS

## 2021-10-12 MED ORDER — OXYCODONE HCL ER 10 MG PO T12A
20.0000 mg | EXTENDED_RELEASE_TABLET | ORAL | Status: AC
  Administered 2021-10-12: 20 mg via ORAL
  Filled 2021-10-12: qty 2

## 2021-10-12 MED ORDER — IOHEXOL 350 MG/ML SOLN
100.0000 mL | Freq: Once | INTRAVENOUS | Status: AC | PRN
  Administered 2021-10-12: 100 mL via INTRAVENOUS

## 2021-10-12 MED ORDER — HEPARIN BOLUS VIA INFUSION
5000.0000 [IU] | Freq: Once | INTRAVENOUS | Status: AC
  Administered 2021-10-12: 5000 [IU] via INTRAVENOUS
  Filled 2021-10-12: qty 5000

## 2021-10-12 MED ORDER — ONDANSETRON HCL 4 MG PO TABS: 4.0000 mg | ORAL_TABLET | Freq: Four times a day (QID) | ORAL | Status: DC | PRN

## 2021-10-12 MED ORDER — LACTATED RINGERS IV BOLUS
1000.0000 mL | Freq: Once | INTRAVENOUS | Status: AC
Start: 2021-10-12 — End: 2021-10-12
  Administered 2021-10-12: 1000 mL via INTRAVENOUS

## 2021-10-12 NOTE — Consult Note (Signed)
ANTICOAGULATION CONSULT NOTE - Initial Consult  Pharmacy Consult for Heparin infusion Indication: pulmonary embolus  Allergies  Allergen Reactions   Duloxetine Other (See Comments)   Lisinopril Cough   Tylenol With Codeine #3 [Acetaminophen-Codeine] Itching   Motrin [Ibuprofen] Rash   Tramadol Rash   Patient Measurements: Height: '6\' 5"'$  (195.6 cm) Weight: 86.2 kg (190 lb) IBW/kg (Calculated) : 89.1 Heparin Dosing Weight: 86.2 kg  Vital Signs: Temp: 98.4 F (36.9 C) (05/21 1438) Temp Source: Oral (05/21 1438) BP: 146/95 (05/21 1900) Pulse Rate: 58 (05/21 1900)  Labs: Recent Labs    10/12/21 1435 10/12/21 1635 10/12/21 2045  HGB 14.2  --   --   HCT 42.8  --   --   PLT 295  --   --   APTT  --   --  24  LABPROT  --   --  12.5  INR  --   --  0.9  HEPARINUNFRC  --   --  <0.10*  CREATININE 1.22  --   --   TROPONINIHS 6 7  --     Estimated Creatinine Clearance: 71.6 mL/min (by C-G formula based on SCr of 1.22 mg/dL).  Medical History: Past Medical History:  Diagnosis Date   Hypertension    Neck injury    Medications:  *Noted Eliquis as PTA medication. Patient reports taking last dose ~ 1 week ago*  Baseline HL and aPTT  Assessment: Noted history of DVT, cocaine use, opioid dependence on daily OxyContin, and CKD. Primary care with [facility] Pharmacy consulted to manage heparin infusion for new PE.  Goal of Therapy:  Heparin level 0.3-0.7 units/ml aPTT 66-102 seconds Monitor platelets by anticoagulation protocol: Yes   Plan:  Give 5000 units bolus x 1 Start heparin infusion at 1400 units/hr Check anti-Xa level in 66-102 hours and daily while on heparin Continue to monitor H&H and platelets  Zelta Enfield Rodriguez-Guzman PharmD, BCPS 10/12/2021 9:52 PM

## 2021-10-12 NOTE — ED Notes (Signed)
Pt to ED for pain to L back and upper L flank area that started yesterday. Pt rates pain as 10/10 and stabbing. Pt grimacing in pain at this time. Denies any hx renal stones.

## 2021-10-12 NOTE — ED Triage Notes (Signed)
Pt via POV from home. Pt c/o L rib into flank pain and SOB but denies any reason to have broken a rib. States it hurts to take a deep breath. Denies falls. Denies cough. Denies heavy lifting. Pt has hx of broken rib fractures 10 years ago and says this is similar but has not had any injury. Denies hx of kidney stones and denies any urinary symptoms. Pt is A&Ox4 and NAD

## 2021-10-12 NOTE — ED Provider Notes (Signed)
[facility] Provider Note    Event Date/Time   First MD Initiated Contact with Patient 10/12/21 1552     (approximate)   History   Chief Complaint: Chest Pain   HPI  Earl Murray is a 66 y.o. male with a history of DVT, cocaine use, opioid dependence on daily OxyContin, CKD who comes ED complaining of left flank pain that was sudden onset last night.  Constant, worse with deep breath.  Not exertional.  No cough fever or chills.  Nonradiating.  No recent travel trauma hospitalization or surgery.  Denies history of kidney stones.  Patient does report that he has been out of his daily OxyContin that he takes for chronic back pain for the last 3 days.     Physical Exam   Triage Vital Signs: ED Triage Vitals  Enc Vitals Group     BP 10/12/21 1438 (!) 163/104     Pulse Rate 10/12/21 1438 77     Resp 10/12/21 1438 16     Temp 10/12/21 1438 98.4 F (36.9 C)     Temp Source 10/12/21 1438 Oral     SpO2 10/12/21 1438 97 %     Weight 10/12/21 1434 190 lb (86.2 kg)     Height 10/12/21 1434 '6\' 5"'$  (1.956 m)     Head Circumference --      Peak Flow --      Pain Score 10/12/21 1433 10     Pain Loc --      Pain Edu? --      Excl. in [place]? --     Most recent vital signs: Vitals:   10/12/21 1800 10/12/21 1900  BP: 134/84 (!) 146/95  Pulse: (!) 59 (!) 58  Resp: 14 14  Temp:    SpO2: 98% 96%    General: Awake, no distress.  CV:  Good peripheral perfusion.  Regular rate and rhythm Resp:  Normal effort.  Clear to auscultation bilaterally Abd:  No distention.  Soft and nontender.  There is left CVA tenderness. Other:  Chest wall stable and nontender.  Mild left back muscular tenderness   ED Results / Procedures / Treatments   Labs (all labs ordered are listed, but only abnormal results are displayed) Labs Reviewed  BASIC METABOLIC PANEL - Abnormal; Notable for the following components:      Result Value   Glucose, Bld 102 (*)    All other components  within normal limits  CBC - Abnormal; Notable for the following components:   WBC 14.0 (*)    RDW 17.7 (*)    All other components within normal limits  URINALYSIS, ROUTINE W REFLEX MICROSCOPIC - Abnormal; Notable for the following components:   Color, Urine YELLOW (*)    APPearance HAZY (*)    All other components within normal limits  D-DIMER, QUANTITATIVE - Abnormal; Notable for the following components:   D-Dimer, Quant 1.05 (*)    All other components within normal limits  TROPONIN I (HIGH SENSITIVITY)  TROPONIN I (HIGH SENSITIVITY)     EKG Interpreted by me Normal sinus rhythm rate of 70.  Normal axis and intervals.  Normal QRS ST segments and T waves.  No evidence of right heart strain or acute ischemia.   RADIOLOGY Chest x-ray viewed and interpreted by me, appears normal.  Radiology report reviewed.   PROCEDURES:  .Critical Care Performed by: Carrie Mew, MD Authorized by: Carrie Mew, MD   Critical care provider statement:  Critical care time (minutes):  35   Critical care time was exclusive of:  Separately billable procedures and treating other patients   Critical care was necessary to treat or prevent imminent or life-threatening deterioration of the following conditions:  Respiratory failure and circulatory failure   Critical care was time spent personally by me on the following activities:  Development of treatment plan with patient or surrogate, discussions with consultants, evaluation of patient's response to treatment, examination of patient, obtaining history from patient or surrogate, ordering and performing treatments and interventions, ordering and review of laboratory studies, ordering and review of radiographic studies, pulse oximetry, re-evaluation of patient's condition and review of old charts   Care discussed with: admitting provider     [place] [facility]: Medications  morphine (PF) 4 MG/ML injection 4 mg (has no administration  in time range)  ketorolac (TORADOL) 15 MG/ML injection 15 mg (15 mg Intravenous Given 10/12/21 1622)  lactated ringers bolus 1,000 mL (0 mLs Intravenous Stopped 10/12/21 1834)  oxyCODONE (OXYCONTIN) 12 hr tablet 20 mg (20 mg Oral Given 10/12/21 1646)  iohexol (OMNIPAQUE) 350 MG/ML injection 100 mL (100 mLs Intravenous Contrast Given 10/12/21 1917)     IMPRESSION / MDM / [place] / ED COURSE  I reviewed the triage vital signs and the nursing notes.                              Differential diagnosis includes, but is not limited to, PE, pneumonia, ureterolithiasis, gastritis, exacerbation of chronic back pain, diverticulitis  Patient's presentation is most consistent with acute presentation with potential threat to life or bodily function.  Patient presents with pleuritic left flank pain, high suspicion for kidney stone versus PE.  Vital signs are normal which makes PE fairly unlikely.  Will obtain a D-dimer to determine need for CTA chest.  Will obtain CT abdomen pelvis identify kidney stone versus other potential causes of flank pain such as diverticulitis   Clinical Course as of 10/12/21 2028  Sun Oct 12, 2021  1845 Troponin normal.  D-dimer elevated.  Will order CTA chest, CT abdomen pelvis. [PS]  2026 CT chest discussed with radiologist who notes the presence of 2 segmental pulmonary emboli in the left lung with evidence of right heart strain.  Patient still having substantial pain.  I will give him some IV morphine for pain control and plan to admit with heparin bolus and infusion per pharmacy protocol. [PS]    Clinical Course User Index [PS] Carrie Mew, MD     FINAL CLINICAL IMPRESSION(S) / ED DIAGNOSES   Final diagnoses:  Other acute pulmonary embolism, unspecified whether acute cor pulmonale present ([place])     Rx / DC Orders   ED Discharge Orders     None        Note:  This document was prepared using Dragon voice recognition software and may include  unintentional dictation errors.   Carrie Mew, MD 10/12/21 2028

## 2021-10-12 NOTE — H&P (Signed)
[place]   PATIENT NAME: Earl Murray    MR#:  [id number]  DATE OF BIRTH:  [birth year]  DATE OF ADMISSION:  10/12/2021  PRIMARY CARE PHYSICIAN: [facility]   Patient is coming from: Home  REQUESTING/REFERRING PHYSICIAN: Brenton Grills, MD  CHIEF COMPLAINT:   Chief Complaint  Patient presents with   Chest Pain  Left-sided upper mid back pain HISTORY OF PRESENT ILLNESS:  Earl Murray is a 66 y.o. male with medical history significant for essential hypertension and history of DVT after back surgery in September of last year s/p anticoagulation for 3 months, presented to the emergency room with acute onset of left-sided upper and mid back pain worsening with deep breathing since yesterday.  He admits to dyspnea today.  He has been having bilateral lower extremity pain for the last week without significant worsening swelling.  No nausea or vomiting.  Admits to mild diarrhea without fever or chills or abdominal pain.  No dysuria, oliguria or hematuria or flank pain.  ED Course: When he came to the ER, BP was 163/104 with otherwise normal vital signs.  Labs revealed unremarkable BMP.  CBC showed leukocytosis 15.1. High-sensitivity troponin I was 7.  Coagulation profile was within normal.  MRSA screen was positive.    EKG as reviewed by me : Normal sinus rhythm with a rate of 70 Imaging: Chest and abdomen CTA revealed acute pulmonary embolism involving segmental branch pulmonary arteries of left lower lobe and left upper lobe with elevated RV to LV ratio of 1.2 with clear lungs, aortic atherosclerosis and no acute abdominal pelvic process.  The patient was given 50 mg of IV Toradol, 1 L bolus of IV lactated Ringer, 4 mg IV morphine sulfate and 20 mg of p.o. oxycodone as well as IV heparin bolus and drip.  I discussed this case with Dr. Shelia Media with vascular surgery who recommended watching him in a stepdown unit bed overnight and keeping him n.p.o. after midnight.  He will be admitted  to a stepdown unit bed for further evaluation and management. PAST MEDICAL HISTORY:   Past Medical History:  Diagnosis Date   Hypertension    Neck injury     PAST SURGICAL HISTORY:   Past Surgical History:  Procedure Laterality Date   BACK SURGERY     CHOLECYSTECTOMY     ELBOW SURGERY     ERCP N/A 10/27/2019   Procedure: ENDOSCOPIC RETROGRADE CHOLANGIOPANCREATOGRAPHY (ERCP);  Surgeon: Lucilla Lame, MD;  Location: [facility] ENDOSCOPY;  Service: Endoscopy;  Laterality: N/A;   FOOT SURGERY     HERNIA REPAIR     4 hernia repairs    SOCIAL HISTORY:   Social History   Tobacco Use   Smoking status: Former    Types: Cigarettes    Quit date: 05/2018    Years since quitting: 3.3   Smokeless tobacco: Former  Substance Use Topics   Alcohol use: Not Currently    FAMILY HISTORY:   Family History  Problem Relation Age of Onset   Hypertension Mother    Heart disease Mother    Colon cancer Father     DRUG ALLERGIES:   Allergies  Allergen Reactions   Duloxetine Other (See Comments)   Lisinopril Cough   Tylenol With Codeine #3 [Acetaminophen-Codeine] Itching   Motrin [Ibuprofen] Rash   Tramadol Rash    REVIEW OF SYSTEMS:   ROS As per history of present illness. All pertinent systems were reviewed above. Constitutional, HEENT, cardiovascular, respiratory, GI,  GU, musculoskeletal, neuro, psychiatric, endocrine, integumentary and hematologic systems were reviewed and are otherwise negative/unremarkable except for positive findings mentioned above in the HPI.   MEDICATIONS AT HOME:   Prior to Admission medications   Medication Sig Start Date End Date Taking? Authorizing Provider  amLODipine (NORVASC) 10 MG tablet Take 10 mg by mouth daily.    Yes [provider]  hydrochlorothiazide (HYDRODIURIL) 25 MG tablet Take 1 tablet (25 mg total) by mouth daily. 08/15/18  Yes Cook, Jayce G, DO  oxyCODONE-acetaminophen (PERCOCET/ROXICET) 5-325 MG tablet Take 1 tablet by mouth in  the morning, at noon, and at bedtime.   Yes [provider]  apixaban (ELIQUIS) 5 MG TABS tablet Take 1 tablet (5 mg total) by mouth 2 (two) times daily. Patient not taking: Reported on 10/12/2021 03/25/21   Algernon Huxley, MD  atorvastatin (LIPITOR) 20 MG tablet TAKE ONE-HALF TABLET BY MOUTH AT BEDTIME FOR CHOLESTEROL Patient not taking: Reported on 10/12/2021 05/23/20   [provider]  baclofen (LIORESAL) 10 MG tablet 1-2 tid for back muscle spasm Patient not taking: Reported on 10/12/2021 08/15/21   Rodriguez-Southworth, Sunday Spillers, PA-C  chlorthalidone (HYGROTON) 25 MG tablet TAKE ONE TABLET BY MOUTH EVERY DAY FOR BLOOD PRESSURE INSTEAD OF HYDROCHLOROTHIAZIDE Patient not taking: Reported on 10/12/2021 12/03/20   [provider]  dicyclomine (BENTYL) 10 MG capsule Take 1 capsule (10 mg total) by mouth 3 (three) times daily as needed for up to 14 days for spasms. or abdominal pain 12/13/20 12/27/20  Hinda Kehr, MD  fexofenadine [facility] ALLERGY) 60 MG tablet Take 1 tablet (60 mg total) by mouth daily. For day time drainage Patient not taking: Reported on 10/12/2021 12/15/20   Rodriguez-Southworth, Sunday Spillers, PA-C  fluticasone [facility]) 50 MCG/ACT nasal spray Place 2 sprays into both nostrils daily. At night time for stuffy nose Patient not taking: Reported on 10/12/2021 12/15/20   Rodriguez-Southworth, Sunday Spillers, PA-C  HYDROcodone-acetaminophen (NORCO/VICODIN) 5-325 MG tablet Take 2 tablets by mouth every 6 (six) hours as needed. Patient not taking: Reported on 10/12/2021 09/13/21   Ward, Delice Bison, DO  mirtazapine (REMERON) 7.5 MG tablet  11/25/20   [provider]  omeprazole (PRILOSEC OTC) 20 MG tablet Take 1 tablet (20 mg total) by mouth daily. Patient not taking: Reported on 10/12/2021 12/13/20 12/13/21  Hinda Kehr, MD  ondansetron (ZOFRAN-ODT) 4 MG disintegrating tablet Take 1 tablet (4 mg total) by mouth every 6 (six) hours as needed for nausea or vomiting. Patient not taking:  Reported on 10/12/2021 09/13/21   Ward, Delice Bison, DO  Oxycodone HCl 10 MG TABS Take by mouth. Patient not taking: Reported on 10/12/2021    [provider]  pantoprazole (PROTONIX) 40 MG tablet TAKE ONE TABLET BY MOUTH EVERY DAY TO CONTROL STOMACH ACID TAKE 30 MINUTES BEFORE THE LARGES MEAL OF THE DAY Patient not taking: Reported on 10/12/2021 12/03/20   [provider]  sucralfate (CARAFATE) 1 g tablet Take 1 tablet (1 g total) by mouth 4 (four) times daily as needed (for abdominal discomfort, nausea, and/or vomiting). Patient not taking: Reported on 10/12/2021 12/13/20   Hinda Kehr, MD  venlafaxine XR (EFFEXOR-XR) 37.5 MG 24 hr capsule  11/13/20   [provider]  gabapentin (NEURONTIN) 300 MG capsule gabapentin 300 mg capsule  one tab bid-tid  03/01/19  [provider]  ipratropium (ATROVENT) 0.06 % nasal spray Place 2 sprays into both nostrils 4 (four) times daily as needed for rhinitis. 10/22/19 03/16/20  Karen Kitchens, NP  levocetirizine (XYZAL) 5 MG tablet Take 1 tablet (5 mg total) by mouth every evening. 09/30/19 03/16/20  Coral Spikes, DO      VITAL SIGNS:  Blood pressure (!) 150/83, pulse (!) 58, temperature 98.3 F (36.8 C), temperature source Oral, resp. rate 11, height '6\' 5"'$  (1.956 m), weight 86.2 kg, SpO2 98 %.  PHYSICAL EXAMINATION:  Physical Exam  GENERAL:  66 y.o.-year-old African-American male patient lying in the bed with no acute distress.  EYES: Pupils equal, round, reactive to light and accommodation. No scleral icterus. Extraocular muscles intact.  HEENT: Head atraumatic, normocephalic. Oropharynx and nasopharynx clear.  NECK:  Supple, no jugular venous distention. No thyroid enlargement, no tenderness.  LUNGS: Normal breath sounds bilaterally, no wheezing, rales,rhonchi or crepitation. No use of accessory muscles of respiration.  CARDIOVASCULAR: Regular rate and rhythm, S1, S2 normal. No murmurs, rubs, or gallops.  ABDOMEN: Soft,  nondistended, nontender. Bowel sounds present. No organomegaly or mass.  EXTREMITIES: No pedal edema, cyanosis, or clubbing.  NEUROLOGIC: Cranial nerves II through XII are intact. Muscle strength 5/5 in all extremities. Sensation intact. Gait not checked.  PSYCHIATRIC: The patient is alert and oriented x 3.  Normal affect and good eye contact. SKIN: No obvious rash, lesion, or ulcer.   LABORATORY PANEL:   CBC Recent Labs  Lab 10/13/21 0146  WBC 15.1*  HGB 12.9*  HCT 39.4  PLT 256   ------------------------------------------------------------------------------------------------------------------  Chemistries  Recent Labs  Lab 10/13/21 0146  NA 140  K 3.9  CL 107  CO2 27  GLUCOSE 102*  BUN 15  CREATININE 1.01  CALCIUM 9.0   ------------------------------------------------------------------------------------------------------------------  Cardiac Enzymes No results for input(s): TROPONINI in the last 168 hours. ------------------------------------------------------------------------------------------------------------------  RADIOLOGY:  DG Chest 2 View  Result Date: 10/12/2021 CLINICAL DATA:  Shortness of breath, left rib and flank pain EXAM: CHEST - 2 VIEW COMPARISON:  None Available. 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 Normal heart size and vascularity. Negative for pneumonia, edema, effusion or pneumothorax. Trachea midline. Aorta atherosclerotic. No acute osseous finding. Lower thoracic fusion hardware partially imaged. IMPRESSION: Hyperinflation.  No superimposed acute process by plain radiography. Electronically Signed   By: Jerilynn Mages.  Shick M.D.   On: 10/12/2021 15:52   CT Angio Chest PE W and/or Wo Contrast  Result Date: 10/12/2021 CLINICAL DATA:  Pulmonary embolism (PE) suspected, positive D-dimer; Flank pain, kidney stone suspected EXAM: CT ANGIOGRAPHY CHEST CT ABDOMEN AND PELVIS WITH CONTRAST TECHNIQUE: Multidetector CT  imaging of the chest was performed using the standard protocol during bolus administration of intravenous contrast. Multiplanar CT image reconstructions and MIPs were obtained to evaluate the vascular anatomy. Multidetector CT imaging of the abdomen and pelvis was performed using the standard protocol during bolus administration of intravenous contrast. RADIATION DOSE REDUCTION: This exam was performed according to the departmental dose-optimization program which includes automated exposure control, adjustment of the mA and/or kV according to patient size and/or use of iterative reconstruction technique. CONTRAST:  171m OMNIPAQUE IOHEXOL 350 MG/ML SOLN COMPARISON:  CT abdomen pelvis 10/08/2020, CT lumbar spine 07/16/2021 FINDINGS: CTA CHEST FINDINGS Cardiovascular: Adequate opacification of the pulmonary arteries. Filling defects involving segmental branch pulmonary arteries of the left lower lobe (series 5, image 74; series 2, image 83). Additional PE involving a segmental left upper lobe branch (series 2, image 60). No lobar or central pulmonary embolism. Elevated RV to LV ratio of 1.2. Heart size is normal. No pericardial effusion. Thoracic aorta is nonaneurysmal. Scattered atherosclerotic calcifications of the  aorta and coronary arteries. Mediastinum/Nodes: No enlarged mediastinal, hilar, or axillary lymph nodes. Thyroid gland, trachea, and esophagus demonstrate no significant findings. Lungs/Pleura: Mild dependent subsegmental bibasilar atelectasis. Lungs are otherwise clear. No pleural effusion or pneumothorax. Musculoskeletal: No chest wall abnormality. No acute or significant osseous findings. Review of the MIP images confirms the above findings. CT ABDOMEN and PELVIS FINDINGS Hepatobiliary: Prior cholecystectomy. Persistent pneumobilia within the liver. Chronically dilated common bile duct, also with pneumobilia. No focal liver lesion identified. Pancreas: Unremarkable. No pancreatic ductal dilatation or  surrounding inflammatory changes. Spleen: Normal in size without focal abnormality. Adrenals/Urinary Tract: Adrenal glands are unremarkable. Kidneys are normal, without renal calculi, solid lesion, or hydronephrosis. Bladder is unremarkable. Stomach/Bowel: Stomach is within normal limits. No evidence of bowel wall thickening, distention, or inflammatory changes. Vascular/Lymphatic: Aortic atherosclerosis. No enlarged abdominal or pelvic lymph nodes. Reproductive: Prostate is unremarkable. Other: No free fluid. No abdominopelvic fluid collection. No pneumoperitoneum. No abdominal wall hernia. Musculoskeletal: Extensive thoraco lumbar to bi-iliac spinal fusion. Similar appearance of L4 vertebral body compression fracture. Unchanged anterolisthesis of L3 on L4 and L2 on L3. Similar degree of perihardware lucency adjacent to the screws at T10. No new or acute bony findings. Review of the MIP images confirms the above findings. IMPRESSION: 1. Acute pulmonary emboli involving segmental branch pulmonary arteries of the left lower lobe and left upper lobe. Elevated RV to LV ratio of 1.2. 2. Lungs are clear. 3. No acute abdominopelvic process. 4. Additional chronic and/or incidental findings, as above. 5. Aortic atherosclerosis (ICD10-I70.0). These results were called by telephone at the time of interpretation on 10/12/2021 at 8:00 pm to provider PHILLIP STAFFORD , who verbally acknowledged these results. Electronically Signed   By: Davina Poke D.O.   On: 10/12/2021 20:06   CT ABDOMEN PELVIS W CONTRAST  Result Date: 10/12/2021 CLINICAL DATA:  Pulmonary embolism (PE) suspected, positive D-dimer; Flank pain, kidney stone suspected EXAM: CT ANGIOGRAPHY CHEST CT ABDOMEN AND PELVIS WITH CONTRAST TECHNIQUE: Multidetector CT imaging of the chest was performed using the standard protocol during bolus administration of intravenous contrast. Multiplanar CT image reconstructions and MIPs were obtained to evaluate the vascular  anatomy. Multidetector CT imaging of the abdomen and pelvis was performed using the standard protocol during bolus administration of intravenous contrast. RADIATION DOSE REDUCTION: This exam was performed according to the departmental dose-optimization program which includes automated exposure control, adjustment of the mA and/or kV according to patient size and/or use of iterative reconstruction technique. CONTRAST:  156m OMNIPAQUE IOHEXOL 350 MG/ML SOLN COMPARISON:  CT abdomen pelvis 10/08/2020, CT lumbar spine 07/16/2021 FINDINGS: CTA CHEST FINDINGS Cardiovascular: Adequate opacification of the pulmonary arteries. Filling defects involving segmental branch pulmonary arteries of the left lower lobe (series 5, image 74; series 2, image 83). Additional PE involving a segmental left upper lobe branch (series 2, image 60). No lobar or central pulmonary embolism. Elevated RV to LV ratio of 1.2. Heart size is normal. No pericardial effusion. Thoracic aorta is nonaneurysmal. Scattered atherosclerotic calcifications of the aorta and coronary arteries. Mediastinum/Nodes: No enlarged mediastinal, hilar, or axillary lymph nodes. Thyroid gland, trachea, and esophagus demonstrate no significant findings. Lungs/Pleura: Mild dependent subsegmental bibasilar atelectasis. Lungs are otherwise clear. No pleural effusion or pneumothorax. Musculoskeletal: No chest wall abnormality. No acute or significant osseous findings. Review of the MIP images confirms the above findings. CT ABDOMEN and PELVIS FINDINGS Hepatobiliary: Prior cholecystectomy. Persistent pneumobilia within the liver. Chronically dilated common bile duct, also with pneumobilia. No focal liver lesion identified. Pancreas:  Unremarkable. No pancreatic ductal dilatation or surrounding inflammatory changes. Spleen: Normal in size without focal abnormality. Adrenals/Urinary Tract: Adrenal glands are unremarkable. Kidneys are normal, without renal calculi, solid lesion, or  hydronephrosis. Bladder is unremarkable. Stomach/Bowel: Stomach is within normal limits. No evidence of bowel wall thickening, distention, or inflammatory changes. Vascular/Lymphatic: Aortic atherosclerosis. No enlarged abdominal or pelvic lymph nodes. Reproductive: Prostate is unremarkable. Other: No free fluid. No abdominopelvic fluid collection. No pneumoperitoneum. No abdominal wall hernia. Musculoskeletal: Extensive thoraco lumbar to bi-iliac spinal fusion. Similar appearance of L4 vertebral body compression fracture. Unchanged anterolisthesis of L3 on L4 and L2 on L3. Similar degree of perihardware lucency adjacent to the screws at T10. No new or acute bony findings. Review of the MIP images confirms the above findings. IMPRESSION: 1. Acute pulmonary emboli involving segmental branch pulmonary arteries of the left lower lobe and left upper lobe. Elevated RV to LV ratio of 1.2. 2. Lungs are clear. 3. No acute abdominopelvic process. 4. Additional chronic and/or incidental findings, as above. 5. Aortic atherosclerosis (ICD10-I70.0). These results were called by telephone at the time of interpretation on 10/12/2021 at 8:00 pm to provider PHILLIP STAFFORD , who verbally acknowledged these results. Electronically Signed   By: Davina Poke D.O.   On: 10/12/2021 20:06      IMPRESSION AND PLAN:  Assessment and Plan: * Acute pulmonary embolism ([place]) - The patient has mild right ventricular strain. - She is admitted to a stepdown unit bed for close observation. - We will continue him on IV heparin. - Pain management will be provided. - O2 protocol will be followed. - Vascular surgery consult will be obtained. - I notified and discussed the case with Dr. Shelia Media.  Hypertensive urgency - This could be related to his chest pain. - We will place him on as needed IV labetalol. -We will continue prazosin, Norvasc and chlorthalidone while monitoring his blood pressure.  Dyslipidemia - We will continue  statin therapy.  Depression - We will continue his Effexor XR or and Remeron.  GERD without esophagitis - We will continue PPI therapy.   DVT prophylaxis: IV heparin. Advanced Care Planning:  Code Status: full code. Family Communication:  The plan of care was discussed in details with the patient (and family). I answered all questions. The patient agreed to proceed with the above mentioned plan. Further management will depend upon hospital course. Disposition Plan: Back to previous home environment Consults called: Vascular surgery consult. All the records are reviewed and case discussed with ED provider.  Status is: Inpatient  At the time of the admission, it appears that the appropriate admission status for this patient is inpatient.  This is judged to be reasonable and necessary in order to provide the required intensity of service to ensure the patient's safety given the presenting symptoms, physical exam findings and initial radiographic and laboratory data in the context of comorbid conditions.  The patient requires inpatient status due to high intensity of service, high risk of further deterioration and high frequency of surveillance required.  I certify that at the time of admission, it is my clinical judgment that the patient will require inpatient hospital care extending more than 2 midnights.                            Dispo: The patient is from: Home              Anticipated d/c is to: Home  Patient currently is not medically stable to d/c.              Difficult to place patient: No  Christel Mormon M.D on 10/13/2021 at 3:22 AM  [facility]   From 7 PM-7 AM, contact night-coverage [email or web address]  CC: Primary care physician; [place]

## 2021-10-13 DIAGNOSIS — I160 Hypertensive urgency: Secondary | ICD-10-CM

## 2021-10-13 DIAGNOSIS — I2609 Other pulmonary embolism with acute cor pulmonale: Secondary | ICD-10-CM

## 2021-10-13 DIAGNOSIS — K219 Gastro-esophageal reflux disease without esophagitis: Secondary | ICD-10-CM

## 2021-10-13 DIAGNOSIS — F32A Depression, unspecified: Secondary | ICD-10-CM

## 2021-10-13 DIAGNOSIS — E785 Hyperlipidemia, unspecified: Secondary | ICD-10-CM

## 2021-10-13 LAB — BASIC METABOLIC PANEL
Anion gap: 6 (ref 5–15)
BUN: 15 mg/dL (ref 8–23)
CO2: 27 mmol/L (ref 22–32)
Calcium: 9 mg/dL (ref 8.9–10.3)
Chloride: 107 mmol/L (ref 98–111)
Creatinine, Ser: 1.01 mg/dL (ref 0.61–1.24)
GFR, Estimated: >60 mL/min (ref >60)
Glucose, Bld: 102 mg/dL — ABNORMAL HIGH (ref 70–99)
Potassium: 3.9 mmol/L (ref 3.5–5.1)
Sodium: 140 mmol/L (ref 135–145)

## 2021-10-13 LAB — CBC
HCT: 39.4 % (ref 39.0–52.0)
Hemoglobin: 12.9 g/dL — ABNORMAL LOW (ref 13.0–17.0)
MCH: 29.9 pg (ref 26.0–34.0)
MCHC: 32.7 g/dL (ref 30.0–36.0)
MCV: 91.2 fL (ref 80.0–100.0)
Platelets: 256 10*3/uL (ref 150–400)
RBC: 4.32 MIL/uL (ref 4.22–5.81)
RDW: 17.5 % — ABNORMAL HIGH (ref 11.5–15.5)
WBC: 15.1 10*3/uL — ABNORMAL HIGH (ref 4.0–10.5)
nRBC: 0 % (ref 0.0–0.2)

## 2021-10-13 LAB — HEPARIN LEVEL (UNFRACTIONATED)
Heparin Unfractionated: >1.1 IU/mL — ABNORMAL HIGH (ref 0.30–0.70)
Heparin Unfractionated: 1.08 IU/mL — ABNORMAL HIGH (ref 0.30–0.70)
Heparin Unfractionated: 0.74 [IU]/mL — ABNORMAL HIGH (ref 0.30–0.70)

## 2021-10-13 LAB — MRSA NEXT GEN BY PCR, NASAL: MRSA by PCR Next Gen: DETECTED — AB

## 2021-10-13 LAB — HIV ANTIBODY (ROUTINE TESTING W REFLEX): HIV Screen 4th Generation wRfx: NONREACTIVE

## 2021-10-13 LAB — GLUCOSE, CAPILLARY: Glucose-Capillary: 98 mg/dL (ref 70–99)

## 2021-10-13 MED ORDER — LIDOCAINE 5 % EX PTCH
1.0000 | MEDICATED_PATCH | CUTANEOUS | Status: DC
  Administered 2021-10-13: 1 via TRANSDERMAL
  Filled 2021-10-13 (×3): qty 1

## 2021-10-13 MED ORDER — HEPARIN (PORCINE) 25000 UT/250ML-% IV SOLN: 1200.0000 [IU]/h | INTRAVENOUS | Status: DC

## 2021-10-13 MED ORDER — HYDROMORPHONE HCL 1 MG/ML IJ SOLN
0.5000 mg | INTRAMUSCULAR | Status: DC | PRN
  Administered 2021-10-13 – 2021-10-15 (×10): 0.5 mg via INTRAVENOUS
  Filled 2021-10-13 (×10): qty 1

## 2021-10-13 MED ORDER — METHOCARBAMOL 500 MG PO TABS: 750.0000 mg | ORAL_TABLET | Freq: Four times a day (QID) | ORAL | Status: DC | PRN

## 2021-10-13 MED ORDER — OXYCODONE HCL 5 MG PO TABS
5.0000 mg | ORAL_TABLET | ORAL | Status: DC | PRN
  Administered 2021-10-13 – 2021-10-15 (×3): 5 mg via ORAL
  Filled 2021-10-13 (×3): qty 1

## 2021-10-13 MED ORDER — MORPHINE SULFATE (PF) 2 MG/ML IV SOLN
2.0000 mg | INTRAVENOUS | Status: DC | PRN
Start: 2021-10-13 — End: 2021-10-13
  Administered 2021-10-13: 2 mg via INTRAVENOUS
  Filled 2021-10-13: qty 1

## 2021-10-13 MED ORDER — CHLORHEXIDINE GLUCONATE CLOTH 2 % EX PADS: 6.0000 | MEDICATED_PAD | Freq: Every day | CUTANEOUS | Status: DC

## 2021-10-13 MED ORDER — HEPARIN (PORCINE) 25000 UT/250ML-% IV SOLN
800.0000 [IU]/h | INTRAVENOUS | Status: DC
  Administered 2021-10-13: 1000 [IU]/h via INTRAVENOUS
  Filled 2021-10-13: qty 250

## 2021-10-13 NOTE — Assessment & Plan Note (Signed)
-   This could be related to his chest pain. - We will place him on as needed IV labetalol. -We will continue prazosin, Norvasc and chlorthalidone while monitoring his blood pressure.

## 2021-10-13 NOTE — Consult Note (Signed)
ANTICOAGULATION CONSULT NOTE   Pharmacy Consult for Heparin infusion Indication: pulmonary embolus  Allergies  Allergen Reactions   Duloxetine Other (See Comments)   Lisinopril Cough   Tylenol With Codeine #3 [Acetaminophen-Codeine] Itching   Motrin [Ibuprofen] Rash   Tramadol Rash   Patient Measurements: Height: '6\' 5"'$  (195.6 cm) Weight: 86.2 kg (190 lb) IBW/kg (Calculated) : 89.1 Heparin Dosing Weight: 86.2 kg  Vital Signs: Temp: 98.3 F (36.8 C) (05/22 0000) Temp Source: Oral (05/22 0000) BP: 150/83 (05/22 0000) Pulse Rate: 58 (05/22 0000)  Labs: Recent Labs    10/12/21 1435 10/12/21 1635 10/12/21 2045 10/13/21 0146  HGB 14.2  --   --  12.9*  HCT 42.8  --   --  39.4  PLT 295  --   --  256  APTT  --   --  24  --   LABPROT  --   --  12.5  --   INR  --   --  0.9  --   HEPARINUNFRC  --   --  <0.10* >1.10*  CREATININE 1.22  --   --  1.01  TROPONINIHS 6 7  --   --      Estimated Creatinine Clearance: 86.5 mL/min (by C-G formula based on SCr of 1.01 mg/dL).  Medical History: Past Medical History:  Diagnosis Date   Hypertension    Neck injury    Medications:  *Noted Eliquis as PTA medication. Patient reports taking last dose ~ 1 week ago*  Baseline HL and aPTT  Assessment: Noted history of DVT, cocaine use, opioid dependence on daily OxyContin, and CKD. [facility] care with [facility]. Pharmacy consulted to manage heparin infusion for new PE.  Goal of Therapy:  Heparin level 0.3-0.7 units/ml aPTT 66-102 seconds Monitor platelets by anticoagulation protocol: Yes  5/22 0146 HL >1.1   Plan:  Contacted Phlebotomist, confirmed drawn of opposite arm. Contacted RN.  Hold heparin infusion for 1 hr. Restart heparin infusion at 1200 units/hr Recheck HL in 6 hr after restart CBC daily while on heparin  Renda Rolls, PharmD, [facility] 10/13/2021 3:55 AM

## 2021-10-13 NOTE — Assessment & Plan Note (Deleted)
We will continue prazosin, Norvasc and chlorthalidone while monitoring his blood pressure.

## 2021-10-13 NOTE — H&P (View-Only) (Signed)
[facility] Consult Note  MRN : [id number]  Earl Murray is a 66 y.o. (March 17, 1956) male who presents with chief complaint of  Chief Complaint  Patient presents with   Chest Pain  .   Consulting Physician: Ralene Muskrat, MD Reason for consult: Pulmonary embolism History of Present Illness: Earl Murray is a 66 year old male with a past medical history of left lower extremity DVT following back surgery in 02/2021, that presented to the emergency room due to left-sided upper and mid back pain.  The patient notes that he has been having worsening pain and dyspnea since yesterday.  He also notes that several days before this he had been having difficulty with shortness of breath walking up stairs when he exerted himself.  He has not been having significant lower extremity swelling but had noticed pain within the last week or so.  He denies any recent surgery, injury, illness, travel or prolonged immobility.  Upon presentation work-up at [facility], the patient was found to have a pulmonary embolism with mild right heart strain.  Currently the patient is resting comfortably on room air and satting at 95%.  He notes that he has been continuing to have pain in his chest area but recently was given Dilaudid and that was very helpful for him.  Current Facility-Administered Medications  Medication Dose Route Frequency Provider Last Rate Last Admin   acetaminophen (TYLENOL) tablet 650 mg  650 mg Oral Q6H PRN Mansy, Jan A, MD       Or   acetaminophen (TYLENOL) suppository 650 mg  650 mg Rectal Q6H PRN Mansy, Arvella Merles, MD       Chlorhexidine Gluconate Cloth 2 % PADS 6 each  6 each Topical Daily Mansy, Jan A, MD       heparin ADULT infusion 100 units/mL (25000 units/256m)  1,000 Units/hr Intravenous Continuous CBenita Gutter RPH       HYDROmorphone (DILAUDID) injection 0.5 mg  0.5 mg Intravenous Q3H PRN SPriscella Mann Sudheer B, MD   0.5 mg at 10/13/21 1203    lidocaine (LIDODERM) 5 % 1 patch  1 patch Transdermal Q24H SRalene MuskratB, MD   1 patch at 10/13/21 1204   magnesium hydroxide (MILK OF MAGNESIA) suspension 30 mL  30 mL Oral Daily PRN Mansy, Jan A, MD       methocarbamol (ROBAXIN) tablet 750 mg  750 mg Oral Q6H PRN SRalene MuskratB, MD       ondansetron (ZOFRAN) tablet 4 mg  4 mg Oral Q6H PRN Mansy, Jan A, MD       Or   ondansetron ([facility] injection 4 mg  4 mg Intravenous Q6H PRN Mansy, Jan A, MD       oxyCODONE (Oxy IR/ROXICODONE) immediate release tablet 5 mg  5 mg Oral Q4H PRN SRalene MuskratB, MD   5 mg at 10/13/21 1140   traZODone (DESYREL) tablet 25 mg  25 mg Oral QHS PRN Mansy, JArvella Merles MD        Past Medical History:  Diagnosis Date   Hypertension    Neck injury     Past Surgical History:  Procedure Laterality Date   BACK SURGERY     CHOLECYSTECTOMY     ELBOW SURGERY     ERCP N/A 10/27/2019   Procedure: ENDOSCOPIC RETROGRADE CHOLANGIOPANCREATOGRAPHY (ERCP);  Surgeon: WLucilla Lame MD;  [facility]  Service: Endoscopy;  Laterality: N/A;   FOOT SURGERY     HERNIA  REPAIR     4 hernia repairs    Social History Social History   Tobacco Use   Smoking status: Former    Types: Cigarettes    Quit date: 05/2018    Years since quitting: 3.3   Smokeless tobacco: Former  Scientific laboratory technician Use: Never used  Substance Use Topics   Alcohol use: Not Currently   Drug use: Not Currently    Family History Family History  Problem Relation Age of Onset   Hypertension Mother    Heart disease Mother    Colon cancer Father     Allergies  Allergen Reactions   Duloxetine Other (See Comments)   Lisinopril Cough   Tylenol With Codeine #3 [Acetaminophen-Codeine] Itching   Motrin [Ibuprofen] Rash   Tramadol Rash     REVIEW OF SYSTEMS (Negative unless checked)  Constitutional: '[]'$ Weight loss  '[]'$ Fever  '[]'$ Chills Cardiac: '[]'$ Chest pain   '[]'$ Chest pressure   '[]'$ Palpitations   '[x]'$ Shortness of breath when  laying flat   '[]'$ Shortness of breath at rest   '[x]'$ Shortness of breath with exertion. Vascular:  '[]'$ Pain in legs with walking   '[]'$ Pain in legs at rest   '[]'$ Pain in legs when laying flat   '[]'$ Claudication   '[]'$ Pain in feet when walking  '[]'$ Pain in feet at rest  '[]'$ Pain in feet when laying flat   '[]'$ History of DVT   '[]'$ Phlebitis   '[]'$ Swelling in legs   '[]'$ Varicose veins   '[]'$ Non-healing ulcers Pulmonary:   '[]'$ Uses home oxygen   '[]'$ Productive cough   '[]'$ Hemoptysis   '[]'$ Wheeze  '[]'$ COPD   '[]'$ Asthma Neurologic:  '[]'$ Dizziness  '[]'$ Blackouts   '[]'$ Seizures   '[]'$ History of stroke   '[]'$ History of TIA  '[]'$ Aphasia   '[]'$ Temporary blindness   '[]'$ Dysphagia   '[]'$ Weakness or numbness in arms   '[]'$ Weakness or numbness in legs Musculoskeletal:  '[]'$ Arthritis   '[]'$ Joint swelling   '[]'$ Joint pain   '[]'$ Low back pain Hematologic:  '[]'$ Easy bruising  '[]'$ Easy bleeding   '[]'$ Hypercoagulable state   '[]'$ Anemic  '[]'$ Hepatitis Gastrointestinal:  '[]'$ Blood in stool   '[]'$ Vomiting blood  '[]'$ Gastroesophageal reflux/heartburn   '[]'$ Difficulty swallowing. Genitourinary:  '[]'$ Chronic kidney disease   '[]'$ Difficult urination  '[]'$ Frequent urination  '[]'$ Burning with urination   '[]'$ Blood in urine Skin:  '[]'$ Rashes   '[]'$ Ulcers   '[]'$ Wounds Psychological:  '[]'$ History of anxiety   '[]'$  History of major depression.  Physical Examination  Vitals:   10/13/21 0900 10/13/21 1000 10/13/21 1100 10/13/21 1200  BP: 129/72 130/73 (!) 143/72 (!) 161/81  Pulse: (!) 58 66 (!) 56 63  Resp: '11 13 11 14  '$ Temp:    98.6 F (37 C)  TempSrc:    Oral  SpO2: 96% 98% 95% 96%  Weight:      Height:       Body mass index is 22.53 kg/m. Gen:  WD/WN, NAD Head: [place]/AT, No temporalis wasting. Prominent temp pulse not noted. Ear/Nose/Throat: Hearing grossly intact, nares w/o erythema or drainage, oropharynx w/o Erythema/Exudate Eyes: Sclera non-icteric, conjunctiva clear Neck: Trachea midline.  No JVD.  Pulmonary:  Good air movement, respirations not labored, equal bilaterally.  Cardiac: RRR, normal S1, S2. Vascular:   Vessel Right Left  Radial Palpable Palpable   Gastrointestinal: soft, non-tender/non-distended. No guarding/reflex.  Musculoskeletal: M/S 5/5 throughout.  Extremities without ischemic changes.  No deformity or atrophy. No edema. Neurologic: Sensation grossly intact in extremities.  Symmetrical.  Speech is fluent. Motor exam as listed above. Psychiatric: Judgment intact, Mood & affect appropriate for pt's clinical situation. Dermatologic:  No rashes or ulcers noted.  No cellulitis or open wounds. Lymph : No Cervical, Axillary, or Inguinal lymphadenopathy.    CBC Lab Results  Component Value Date   WBC 15.1 (H) 10/13/2021   HGB 12.9 (L) 10/13/2021   HCT 39.4 10/13/2021   MCV 91.2 10/13/2021   PLT 256 10/13/2021    BMET    Component Value Date/Time   NA 140 10/13/2021 0146   K 3.9 10/13/2021 0146   CL 107 10/13/2021 0146   CO2 27 10/13/2021 0146   GLUCOSE 102 (H) 10/13/2021 0146   BUN 15 10/13/2021 0146   CREATININE 1.01 10/13/2021 0146   CALCIUM 9.0 10/13/2021 0146   GFRNONAA >60 10/13/2021 0146   GFRAA >60 11/24/2019 1951   Estimated Creatinine Clearance: 86.5 mL/min (by C-G formula based on SCr of 1.01 mg/dL).  COAG Lab Results  Component Value Date   INR 0.9 10/12/2021    Radiology DG Chest 2 View  Result Date: 10/12/2021 CLINICAL DATA:  Shortness of breath, left rib and flank pain EXAM: CHEST - 2 VIEW COMPARISON:  None Available. 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 Normal heart size and vascularity. Negative for pneumonia, edema, effusion or pneumothorax. Trachea midline. Aorta atherosclerotic. No acute osseous finding. Lower thoracic fusion hardware partially imaged. IMPRESSION: Hyperinflation.  No superimposed acute process by plain radiography. Electronically Signed   By: Jerilynn Mages.  Shick M.D.   On: 10/12/2021 15:52   CT Angio Chest PE W and/or Wo Contrast  Result Date: 10/12/2021 CLINICAL DATA:  Pulmonary embolism (PE)  suspected, positive D-dimer; Flank pain, kidney stone suspected EXAM: CT ANGIOGRAPHY CHEST CT ABDOMEN AND PELVIS WITH CONTRAST TECHNIQUE: Multidetector CT imaging of the chest was performed using the standard protocol during bolus administration of intravenous contrast. Multiplanar CT image reconstructions and MIPs were obtained to evaluate the vascular anatomy. Multidetector CT imaging of the abdomen and pelvis was performed using the standard protocol during bolus administration of intravenous contrast. RADIATION DOSE REDUCTION: This exam was performed according to the departmental dose-optimization program which includes automated exposure control, adjustment of the mA and/or kV according to patient size and/or use of iterative reconstruction technique. CONTRAST:  175m OMNIPAQUE IOHEXOL 350 MG/ML SOLN COMPARISON:  CT abdomen pelvis 10/08/2020, CT lumbar spine 07/16/2021 FINDINGS: CTA CHEST FINDINGS Cardiovascular: Adequate opacification of the pulmonary arteries. Filling defects involving segmental branch pulmonary arteries of the left lower lobe (series 5, image 74; series 2, image 83). Additional PE involving a segmental left upper lobe branch (series 2, image 60). No lobar or central pulmonary embolism. Elevated RV to LV ratio of 1.2. Heart size is normal. No pericardial effusion. Thoracic aorta is nonaneurysmal. Scattered atherosclerotic calcifications of the aorta and coronary arteries. Mediastinum/Nodes: No enlarged mediastinal, hilar, or axillary lymph nodes. Thyroid gland, trachea, and esophagus demonstrate no significant findings. Lungs/Pleura: Mild dependent subsegmental bibasilar atelectasis. Lungs are otherwise clear. No pleural effusion or pneumothorax. Musculoskeletal: No chest wall abnormality. No acute or significant osseous findings. Review of the MIP images confirms the above findings. CT ABDOMEN and PELVIS FINDINGS Hepatobiliary: Prior cholecystectomy. Persistent pneumobilia within the liver.  Chronically dilated common bile duct, also with pneumobilia. No focal liver lesion identified. Pancreas: Unremarkable. No pancreatic ductal dilatation or surrounding inflammatory changes. Spleen: Normal in size without focal abnormality. Adrenals/Urinary Tract: Adrenal glands are unremarkable. Kidneys are normal, without renal calculi, solid lesion, or hydronephrosis. Bladder is unremarkable. Stomach/Bowel: Stomach is within normal limits. No evidence of bowel wall thickening, distention, or inflammatory changes. Vascular/Lymphatic:  Aortic atherosclerosis. No enlarged abdominal or pelvic lymph nodes. Reproductive: Prostate is unremarkable. Other: No free fluid. No abdominopelvic fluid collection. No pneumoperitoneum. No abdominal wall hernia. Musculoskeletal: Extensive thoraco lumbar to bi-iliac spinal fusion. Similar appearance of L4 vertebral body compression fracture. Unchanged anterolisthesis of L3 on L4 and L2 on L3. Similar degree of perihardware lucency adjacent to the screws at T10. No new or acute bony findings. Review of the MIP images confirms the above findings. IMPRESSION: 1. Acute pulmonary emboli involving segmental branch pulmonary arteries of the left lower lobe and left upper lobe. Elevated RV to LV ratio of 1.2. 2. Lungs are clear. 3. No acute abdominopelvic process. 4. Additional chronic and/or incidental findings, as above. 5. Aortic atherosclerosis (ICD10-I70.0). These results were called by telephone at the time of interpretation on 10/12/2021 at 8:00 pm to provider PHILLIP STAFFORD , who verbally acknowledged these results. Electronically Signed   By: Davina Poke D.O.   On: 10/12/2021 20:06   CT ABDOMEN PELVIS W CONTRAST  Result Date: 10/12/2021 CLINICAL DATA:  Pulmonary embolism (PE) suspected, positive D-dimer; Flank pain, kidney stone suspected EXAM: CT ANGIOGRAPHY CHEST CT ABDOMEN AND PELVIS WITH CONTRAST TECHNIQUE: Multidetector CT imaging of the chest was performed using the  standard protocol during bolus administration of intravenous contrast. Multiplanar CT image reconstructions and MIPs were obtained to evaluate the vascular anatomy. Multidetector CT imaging of the abdomen and pelvis was performed using the standard protocol during bolus administration of intravenous contrast. RADIATION DOSE REDUCTION: This exam was performed according to the departmental dose-optimization program which includes automated exposure control, adjustment of the mA and/or kV according to patient size and/or use of iterative reconstruction technique. CONTRAST:  140m OMNIPAQUE IOHEXOL 350 MG/ML SOLN COMPARISON:  CT abdomen pelvis 10/08/2020, CT lumbar spine 07/16/2021 FINDINGS: CTA CHEST FINDINGS Cardiovascular: Adequate opacification of the pulmonary arteries. Filling defects involving segmental branch pulmonary arteries of the left lower lobe (series 5, image 74; series 2, image 83). Additional PE involving a segmental left upper lobe branch (series 2, image 60). No lobar or central pulmonary embolism. Elevated RV to LV ratio of 1.2. Heart size is normal. No pericardial effusion. Thoracic aorta is nonaneurysmal. Scattered atherosclerotic calcifications of the aorta and coronary arteries. Mediastinum/Nodes: No enlarged mediastinal, hilar, or axillary lymph nodes. Thyroid gland, trachea, and esophagus demonstrate no significant findings. Lungs/Pleura: Mild dependent subsegmental bibasilar atelectasis. Lungs are otherwise clear. No pleural effusion or pneumothorax. Musculoskeletal: No chest wall abnormality. No acute or significant osseous findings. Review of the MIP images confirms the above findings. CT ABDOMEN and PELVIS FINDINGS Hepatobiliary: Prior cholecystectomy. Persistent pneumobilia within the liver. Chronically dilated common bile duct, also with pneumobilia. No focal liver lesion identified. Pancreas: Unremarkable. No pancreatic ductal dilatation or surrounding inflammatory changes. Spleen:  Normal in size without focal abnormality. Adrenals/Urinary Tract: Adrenal glands are unremarkable. Kidneys are normal, without renal calculi, solid lesion, or hydronephrosis. Bladder is unremarkable. Stomach/Bowel: Stomach is within normal limits. No evidence of bowel wall thickening, distention, or inflammatory changes. Vascular/Lymphatic: Aortic atherosclerosis. No enlarged abdominal or pelvic lymph nodes. Reproductive: Prostate is unremarkable. Other: No free fluid. No abdominopelvic fluid collection. No pneumoperitoneum. No abdominal wall hernia. Musculoskeletal: Extensive thoraco lumbar to bi-iliac spinal fusion. Similar appearance of L4 vertebral body compression fracture. Unchanged anterolisthesis of L3 on L4 and L2 on L3. Similar degree of perihardware lucency adjacent to the screws at T10. No new or acute bony findings. Review of the MIP images confirms the above findings. IMPRESSION: 1. Acute pulmonary emboli  involving segmental branch pulmonary arteries of the left lower lobe and left upper lobe. Elevated RV to LV ratio of 1.2. 2. Lungs are clear. 3. No acute abdominopelvic process. 4. Additional chronic and/or incidental findings, as above. 5. Aortic atherosclerosis (ICD10-I70.0). These results were called by telephone at the time of interpretation on 10/12/2021 at 8:00 pm to provider PHILLIP STAFFORD , who verbally acknowledged these results. Electronically Signed   By: Davina Poke D.O.   On: 10/12/2021 20:06      Assessment/Plan 1.  Pulmonary embolism The patient does not have a significant Neri embolism however there is some evidence of mild right heart strain.  The patient does have continued chest pain although he does not have much hemodynamic compromise.  I discussed with the the procedure for undergoing a pulmonary thrombectomy.  I discussed that we may not be able to retrieve significant amounts of clot and there may not be significant difference in his symptoms however it may help  with residual issues in the future.  Following the discussion of risk, benefits and alternatives the patient is agreeable to proceed with pulmonary thrombectomy.  Given that the patient has previously had a DVT, it is recommended that the patient remain on anticoagulation for at least 1 year.  Because the patient does not seem to have any clear inciting causes in this instance, hypercoagulable study is also recommended.  2.  Deep vein thrombosis The patient had a DVT in his left lower extremity following back surgery in October of last year.  Follow-up ultrasound in December showed resolution of the DVT.  Given that patient had leg pain at presentation it would be prudent to evaluate for possible new DVT as a source of his pulmonary embolism.  Orders placed.  3.  GERD with esophagitis Continue with PPI therapy  Family Communication:   Kris Hartmann, NP [facility] [phone] (Office Phone) [phone] (Office Fax)  10/13/2021 1:52 PM    This note was created with Dragon medical transcription system.  Any error is purely unintentional

## 2021-10-13 NOTE — Progress Notes (Signed)
PROGRESS NOTE    Earl Murray  YDX:[id number] DOB: 14-May-1956 DOA: 10/12/2021 PCP: [facility]    Brief Narrative:   66 y.o. male with medical history significant for essential hypertension and history of DVT after back surgery in September of last year s/p anticoagulation for 3 months, presented to the emergency room with acute onset of left-sided upper and mid back pain worsening with deep breathing since yesterday.  He admits to dyspnea today.  He has been having bilateral lower extremity pain for the last week without significant worsening swelling.  No nausea or vomiting.  Admits to mild diarrhea without fever or chills or abdominal pain.  No dysuria, oliguria or hematuria or flank pain.  Blood pressure control improved.  Remains hemodynamically stable.  Still complaining of significant sharp left-sided chest and flank pain.  Vascular surgery evaluated patient.  Per vascular surgery PE small intervention unlikely to provide much benefit however patient would like to proceed if any benefit as possible.   Assessment & Plan:   Principal Problem:   Acute pulmonary embolism (HCC) Active Problems:   Dyslipidemia   Hypertensive urgency   GERD without esophagitis   Depression  * Acute pulmonary embolism (HCC) Submassive by criteria.  Mild RV strain noted Vascular surgery consulted Patient hemodynamically stable, on room air, not tachycardic Plan: Continue IV heparin Multimodal pain regimen Telemetry monitoring, oxygen as necessary Vascular surgery consult Plan for pulmonary thrombectomy 5/23 Okay for transfer to progressive unit   Hypertensive urgency Possibly in the setting of chest pain/acute PE Blood pressure control improved  Plan: Continue prazosin, Norvasc, chlorthalidone As needed IV labetalol Continue telemetry monitoring Ensure pain control Okay for transfer to progressive unit   Dyslipidemia PTA statin   Depression PTA Effexor and Remeron   GERD  without esophagitis P.o. PPI daily  DVT prophylaxis: Heparin GTT Code Status: Full Family Communication: None today.  Offered to call but patient declined  Disposition Plan: Status is: Inpatient Remains inpatient appropriate because: Acute PE on IV heparin.  Submassive by criteria.  Plan for pulmonary thrombectomy as inpatient 5/23.   Level of care: Progressive  Consultants:  Vascular surgery  Procedures:  Pulmonary thrombectomy plan 5/23  Antimicrobials: None   Subjective: Seen and examined.  Sitting up on the edge of the bed.  No visible distress.  Not tachycardic.  Normal work of breathing.  Endorsing severe pain in left flank  Objective: Vitals:   10/13/21 0900 10/13/21 1000 10/13/21 1100 10/13/21 1200  BP: 129/72 130/73 (!) 143/72 (!) 161/81  Pulse: (!) 58 66 (!) 56 63  Resp: '11 13 11 14  '$ Temp:    98.6 F (37 C)  TempSrc:    Oral  SpO2: 96% 98% 95% 96%  Weight:      Height:        Intake/Output Summary (Last 24 hours) at 10/13/2021 1355 Last data filed at 10/13/2021 1200 Gross per 24 hour  Intake 2575.65 ml  Output 500 ml  Net 2075.65 ml   Filed Weights   10/12/21 1434 10/13/21 0000  Weight: 86.2 kg 86.2 kg    Examination:  General exam: No acute distress Respiratory system: Lungs clear.  Normal work of breathing.  Room air Cardiovascular system: S1-S2, RRR, no murmurs, no pedal edema Gastrointestinal system: Soft, NT/ND, normal bowel sounds Central nervous system: Alert and oriented. No focal neurological deficits. Extremities: Symmetric 5 x 5 power. Skin: No rashes, lesions or ulcers Psychiatry: Judgement and insight appear normal. Mood & affect appropriate.  Data Reviewed: I have personally reviewed following labs and imaging studies  CBC: Recent Labs  Lab 10/12/21 1435 10/13/21 0146  WBC 14.0* 15.1*  HGB 14.2 12.9*  HCT 42.8 39.4  MCV 91.3 91.2  PLT 295 814   Basic Metabolic Panel: Recent Labs  Lab 10/12/21 1435 10/13/21 0146   NA 139 140  K 4.2 3.9  CL 104 107  CO2 28 27  GLUCOSE 102* 102*  BUN 16 15  CREATININE 1.22 1.01  CALCIUM 9.5 9.0   GFR: Estimated Creatinine Clearance: 86.5 mL/min (by C-G formula based on SCr of 1.01 mg/dL). Liver Function Tests: No results for input(s): AST, ALT, ALKPHOS, BILITOT, PROT, ALBUMIN in the last 168 hours. No results for input(s): LIPASE, AMYLASE in the last 168 hours. No results for input(s): AMMONIA in the last 168 hours. Coagulation Profile: Recent Labs  Lab 10/12/21 2045  INR 0.9   Cardiac Enzymes: No results for input(s): CKTOTAL, CKMB, CKMBINDEX, TROPONINI in the last 168 hours. BNP (last 3 results) No results for input(s): PROBNP in the last 8760 hours. HbA1C: No results for input(s): HGBA1C in the last 72 hours. CBG: Recent Labs  Lab 10/12/21 2358  GLUCAP 98   Lipid Profile: No results for input(s): CHOL, HDL, LDLCALC, TRIG, CHOLHDL, LDLDIRECT in the last 72 hours. Thyroid Function Tests: No results for input(s): TSH, T4TOTAL, FREET4, T3FREE, THYROIDAB in the last 72 hours. Anemia Panel: No results for input(s): VITAMINB12, FOLATE, FERRITIN, TIBC, IRON, RETICCTPCT in the last 72 hours. Sepsis Labs: No results for input(s): PROCALCITON, LATICACIDVEN in the last 168 hours.  Recent Results (from the past 240 hour(s))  MRSA Next Gen by PCR, Nasal     Status: Abnormal   Collection Time: 10/13/21 12:05 AM   Specimen: Nasal Mucosa; Nasal Swab  Result Value Ref Range Status   MRSA by PCR Next Gen DETECTED (A) NOT DETECTED Final    Comment: CRITICAL RESULT CALLED TO, READ BACK BY AND VERIFIED WITH: Frederic Jericho RN @ [phone] 10/13/21 L[facility] (NOTE) The GeneXpert MRSA Assay (FDA approved for NASAL specimens only), is one component of a comprehensive MRSA colonization surveillance program. It is not intended to diagnose MRSA infection nor to guide or monitor treatment for MRSA infections. Test performance is not FDA approved in patients less than 72  years old. Performed at [facility], [address] [place], [place] [address]          Radiology Studies: DG Chest 2 View  Result Date: 10/12/2021 CLINICAL DATA:  Shortness of breath, left rib and flank pain EXAM: CHEST - 2 VIEW COMPARISON:  None Available. 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 Normal heart size and vascularity. Negative for pneumonia, edema, effusion or pneumothorax. Trachea midline. Aorta atherosclerotic. No acute osseous finding. Lower thoracic fusion hardware partially imaged. IMPRESSION: Hyperinflation.  No superimposed acute process by plain radiography. Electronically Signed   By: Jerilynn Mages.  Shick M.D.   On: 10/12/2021 15:52   CT Angio Chest PE W and/or Wo Contrast  Result Date: 10/12/2021 CLINICAL DATA:  Pulmonary embolism (PE) suspected, positive D-dimer; Flank pain, kidney stone suspected EXAM: CT ANGIOGRAPHY CHEST CT ABDOMEN AND PELVIS WITH CONTRAST TECHNIQUE: Multidetector CT imaging of the chest was performed using the standard protocol during bolus administration of intravenous contrast. Multiplanar CT image reconstructions and MIPs were obtained to evaluate the vascular anatomy. Multidetector CT imaging of the abdomen and pelvis was performed using the standard protocol during bolus administration of intravenous contrast. RADIATION DOSE  REDUCTION: This exam was performed according to the departmental dose-optimization program which includes automated exposure control, adjustment of the mA and/or kV according to patient size and/or use of iterative reconstruction technique. CONTRAST:  152m OMNIPAQUE IOHEXOL 350 MG/ML SOLN COMPARISON:  CT abdomen pelvis 10/08/2020, CT lumbar spine 07/16/2021 FINDINGS: CTA CHEST FINDINGS Cardiovascular: Adequate opacification of the pulmonary arteries. Filling defects involving segmental branch pulmonary arteries of the left lower lobe (series 5, image 74; series 2, image 83).  Additional PE involving a segmental left upper lobe branch (series 2, image 60). No lobar or central pulmonary embolism. Elevated RV to LV ratio of 1.2. Heart size is normal. No pericardial effusion. Thoracic aorta is nonaneurysmal. Scattered atherosclerotic calcifications of the aorta and coronary arteries. Mediastinum/Nodes: No enlarged mediastinal, hilar, or axillary lymph nodes. Thyroid gland, trachea, and esophagus demonstrate no significant findings. Lungs/Pleura: Mild dependent subsegmental bibasilar atelectasis. Lungs are otherwise clear. No pleural effusion or pneumothorax. Musculoskeletal: No chest wall abnormality. No acute or significant osseous findings. Review of the MIP images confirms the above findings. CT ABDOMEN and PELVIS FINDINGS Hepatobiliary: Prior cholecystectomy. Persistent pneumobilia within the liver. Chronically dilated common bile duct, also with pneumobilia. No focal liver lesion identified. Pancreas: Unremarkable. No pancreatic ductal dilatation or surrounding inflammatory changes. Spleen: Normal in size without focal abnormality. Adrenals/Urinary Tract: Adrenal glands are unremarkable. Kidneys are normal, without renal calculi, solid lesion, or hydronephrosis. Bladder is unremarkable. Stomach/Bowel: Stomach is within normal limits. No evidence of bowel wall thickening, distention, or inflammatory changes. Vascular/Lymphatic: Aortic atherosclerosis. No enlarged abdominal or pelvic lymph nodes. Reproductive: Prostate is unremarkable. Other: No free fluid. No abdominopelvic fluid collection. No pneumoperitoneum. No abdominal wall hernia. Musculoskeletal: Extensive thoraco lumbar to bi-iliac spinal fusion. Similar appearance of L4 vertebral body compression fracture. Unchanged anterolisthesis of L3 on L4 and L2 on L3. Similar degree of perihardware lucency adjacent to the screws at T10. No new or acute bony findings. Review of the MIP images confirms the above findings. IMPRESSION: 1.  Acute pulmonary emboli involving segmental branch pulmonary arteries of the left lower lobe and left upper lobe. Elevated RV to LV ratio of 1.2. 2. Lungs are clear. 3. No acute abdominopelvic process. 4. Additional chronic and/or incidental findings, as above. 5. Aortic atherosclerosis (ICD10-I70.0). These results were called by telephone at the time of interpretation on 10/12/2021 at 8:00 pm to provider PHILLIP STAFFORD , who verbally acknowledged these results. Electronically Signed   By: NDavina PokeD.O.   On: 10/12/2021 20:06   CT ABDOMEN PELVIS W CONTRAST  Result Date: 10/12/2021 CLINICAL DATA:  Pulmonary embolism (PE) suspected, positive D-dimer; Flank pain, kidney stone suspected EXAM: CT ANGIOGRAPHY CHEST CT ABDOMEN AND PELVIS WITH CONTRAST TECHNIQUE: Multidetector CT imaging of the chest was performed using the standard protocol during bolus administration of intravenous contrast. Multiplanar CT image reconstructions and MIPs were obtained to evaluate the vascular anatomy. Multidetector CT imaging of the abdomen and pelvis was performed using the standard protocol during bolus administration of intravenous contrast. RADIATION DOSE REDUCTION: This exam was performed according to the departmental dose-optimization program which includes automated exposure control, adjustment of the mA and/or kV according to patient size and/or use of iterative reconstruction technique. CONTRAST:  1057mOMNIPAQUE IOHEXOL 350 MG/ML SOLN COMPARISON:  CT abdomen pelvis 10/08/2020, CT lumbar spine 07/16/2021 FINDINGS: CTA CHEST FINDINGS Cardiovascular: Adequate opacification of the pulmonary arteries. Filling defects involving segmental branch pulmonary arteries of the left lower lobe (series 5, image 74; series 2, image 83). Additional PE involving  a segmental left upper lobe branch (series 2, image 60). No lobar or central pulmonary embolism. Elevated RV to LV ratio of 1.2. Heart size is normal. No pericardial effusion.  Thoracic aorta is nonaneurysmal. Scattered atherosclerotic calcifications of the aorta and coronary arteries. Mediastinum/Nodes: No enlarged mediastinal, hilar, or axillary lymph nodes. Thyroid gland, trachea, and esophagus demonstrate no significant findings. Lungs/Pleura: Mild dependent subsegmental bibasilar atelectasis. Lungs are otherwise clear. No pleural effusion or pneumothorax. Musculoskeletal: No chest wall abnormality. No acute or significant osseous findings. Review of the MIP images confirms the above findings. CT ABDOMEN and PELVIS FINDINGS Hepatobiliary: Prior cholecystectomy. Persistent pneumobilia within the liver. Chronically dilated common bile duct, also with pneumobilia. No focal liver lesion identified. Pancreas: Unremarkable. No pancreatic ductal dilatation or surrounding inflammatory changes. Spleen: Normal in size without focal abnormality. Adrenals/Urinary Tract: Adrenal glands are unremarkable. Kidneys are normal, without renal calculi, solid lesion, or hydronephrosis. Bladder is unremarkable. Stomach/Bowel: Stomach is within normal limits. No evidence of bowel wall thickening, distention, or inflammatory changes. Vascular/Lymphatic: Aortic atherosclerosis. No enlarged abdominal or pelvic lymph nodes. Reproductive: Prostate is unremarkable. Other: No free fluid. No abdominopelvic fluid collection. No pneumoperitoneum. No abdominal wall hernia. Musculoskeletal: Extensive thoraco lumbar to bi-iliac spinal fusion. Similar appearance of L4 vertebral body compression fracture. Unchanged anterolisthesis of L3 on L4 and L2 on L3. Similar degree of perihardware lucency adjacent to the screws at T10. No new or acute bony findings. Review of the MIP images confirms the above findings. IMPRESSION: 1. Acute pulmonary emboli involving segmental branch pulmonary arteries of the left lower lobe and left upper lobe. Elevated RV to LV ratio of 1.2. 2. Lungs are clear. 3. No acute abdominopelvic process. 4.  Additional chronic and/or incidental findings, as above. 5. Aortic atherosclerosis (ICD10-I70.0). These results were called by telephone at the time of interpretation on 10/12/2021 at 8:00 pm to provider PHILLIP STAFFORD , who verbally acknowledged these results. Electronically Signed   By: Davina Poke D.O.   On: 10/12/2021 20:06        Scheduled Meds:  Chlorhexidine Gluconate Cloth  6 each Topical Daily   lidocaine  1 patch Transdermal Q24H   Continuous Infusions:  heparin       LOS: 1 day      Sidney Ace, MD [facility]   If 7PM-7AM, please contact night-coverage  10/13/2021, 1:55 PM

## 2021-10-13 NOTE — Assessment & Plan Note (Signed)
-   We will continue PPI therapy 

## 2021-10-13 NOTE — Assessment & Plan Note (Signed)
-   We will continue statin therapy. 

## 2021-10-13 NOTE — Progress Notes (Signed)
Patient arrived from ICU to 2A as a transfer via wheelchair. Settled into room 2A-252 placed on cardiac monitor/[id number].  Oriented to room and call bell. Currently in recliner with call bell in reach.

## 2021-10-13 NOTE — Assessment & Plan Note (Signed)
-   The patient has mild right ventricular strain. - She is admitted to a stepdown unit bed for close observation. - We will continue him on IV heparin. - Pain management will be provided. - O2 protocol will be followed. - Vascular surgery consult will be obtained. - I notified and discussed the case with Dr. Shelia Media.

## 2021-10-13 NOTE — Consult Note (Signed)
[place] for IV Heparin Indication: pulmonary embolus  Patient Measurements: Height: '6\' 5"'$  (195.6 cm) Weight: 86.2 kg (190 lb) IBW/kg (Calculated) : 89.1 Heparin Dosing Weight: 86.2 kg  Labs: Recent Labs    10/12/21 1435 10/12/21 1635 10/12/21 2045 10/12/21 2045 10/13/21 0146 10/13/21 1131 10/13/21 2147  HGB 14.2  --   --   --  12.9*  --   --   HCT 42.8  --   --   --  39.4  --   --   PLT 295  --   --   --  256  --   --   APTT  --   --  24  --   --   --   --   LABPROT  --   --  12.5  --   --   --   --   INR  --   --  0.9  --   --   --   --   HEPARINUNFRC  --   --  <0.10*   < > >1.10* 1.08* 0.74*  CREATININE 1.22  --   --   --  1.01  --   --   TROPONINIHS 6 7  --   --   --   --   --    < > = values in this interval not displayed.     Estimated Creatinine Clearance: 86.5 mL/min (by C-G formula based on SCr of 1.01 mg/dL).  Medical History: Past Medical History:  Diagnosis Date   Hypertension    Neck injury    Medications:  *Noted Eliquis as PTA medication. Patient reports taking last dose ~ 1 week ago*  Baseline HL < 0.10, aPTT 24s, INR 0.9  Assessment: Noted history of DVT, cocaine use, opioid dependence on daily OxyContin, and CKD. Primary care with [facility]. Pharmacy consulted to manage heparin infusion for new PE.  Goal of Therapy:  Heparin level 0.3-0.7 units/ml Monitor platelets by anticoagulation protocol: Yes   Plan:  5/22:  HL @ 2147 = 0.74, SUPRAtherapeutic Will decrease heparin infusion to 900 units/hr and recheck HL 6 hrs after rate change.   Cyd Hostler D  10/13/2021 10:53 PM

## 2021-10-13 NOTE — Consult Note (Signed)
[facility] Consult Note  MRN : [id number]  Earl Murray is a 66 y.o. (1956-04-14) male who presents with chief complaint of  Chief Complaint  Patient presents with   Chest Pain  .   Consulting Physician: Ralene Muskrat, MD Reason for consult: Pulmonary embolism History of Present Illness: Earl Murray is a 66 year old male with a past medical history of left lower extremity DVT following back surgery in 02/2021, that presented to the emergency room due to left-sided upper and mid back pain.  The patient notes that he has been having worsening pain and dyspnea since yesterday.  He also notes that several days before this he had been having difficulty with shortness of breath walking up stairs when he exerted himself.  He has not been having significant lower extremity swelling but had noticed pain within the last week or so.  He denies any recent surgery, injury, illness, travel or prolonged immobility.  Upon presentation work-up at [facility], the patient was found to have a pulmonary embolism with mild right heart strain.  Currently the patient is resting comfortably on room air and satting at 95%.  He notes that he has been continuing to have pain in his chest area but recently was given Dilaudid and that was very helpful for him.  Current Facility-Administered Medications  Medication Dose Route Frequency Provider Last Rate Last Admin   acetaminophen (TYLENOL) tablet 650 mg  650 mg Oral Q6H PRN Mansy, Jan A, MD       Or   acetaminophen (TYLENOL) suppository 650 mg  650 mg Rectal Q6H PRN Mansy, Arvella Merles, MD       Chlorhexidine Gluconate Cloth 2 % PADS 6 each  6 each Topical Daily Mansy, Jan A, MD       heparin ADULT infusion 100 units/mL (25000 units/278m)  1,000 Units/hr Intravenous Continuous CBenita Gutter RPH       HYDROmorphone (DILAUDID) injection 0.5 mg  0.5 mg Intravenous Q3H PRN SPriscella Mann Sudheer B, MD   0.5 mg at 10/13/21 1203    lidocaine (LIDODERM) 5 % 1 patch  1 patch Transdermal Q24H SRalene MuskratB, MD   1 patch at 10/13/21 1204   magnesium hydroxide (MILK OF MAGNESIA) suspension 30 mL  30 mL Oral Daily PRN Mansy, Jan A, MD       methocarbamol (ROBAXIN) tablet 750 mg  750 mg Oral Q6H PRN SRalene MuskratB, MD       ondansetron (ZOFRAN) tablet 4 mg  4 mg Oral Q6H PRN Mansy, Jan A, MD       Or   ondansetron ([facility] injection 4 mg  4 mg Intravenous Q6H PRN Mansy, Jan A, MD       oxyCODONE (Oxy IR/ROXICODONE) immediate release tablet 5 mg  5 mg Oral Q4H PRN SRalene MuskratB, MD   5 mg at 10/13/21 1140   traZODone (DESYREL) tablet 25 mg  25 mg Oral QHS PRN Mansy, JArvella Merles MD        Past Medical History:  Diagnosis Date   Hypertension    Neck injury     Past Surgical History:  Procedure Laterality Date   BACK SURGERY     CHOLECYSTECTOMY     ELBOW SURGERY     ERCP N/A 10/27/2019   Procedure: ENDOSCOPIC RETROGRADE CHOLANGIOPANCREATOGRAPHY (ERCP);  Surgeon: WLucilla Lame MD;  [facility]  Service: Endoscopy;  Laterality: N/A;   FOOT SURGERY     HERNIA  REPAIR     4 hernia repairs    Social History Social History   Tobacco Use   Smoking status: Former    Types: Cigarettes    Quit date: 05/2018    Years since quitting: 3.3   Smokeless tobacco: Former  Scientific laboratory technician Use: Never used  Substance Use Topics   Alcohol use: Not Currently   Drug use: Not Currently    Family History Family History  Problem Relation Age of Onset   Hypertension Mother    Heart disease Mother    Colon cancer Father     Allergies  Allergen Reactions   Duloxetine Other (See Comments)   Lisinopril Cough   Tylenol With Codeine #3 [Acetaminophen-Codeine] Itching   Motrin [Ibuprofen] Rash   Tramadol Rash     REVIEW OF SYSTEMS (Negative unless checked)  Constitutional: '[]'$ Weight loss  '[]'$ Fever  '[]'$ Chills Cardiac: '[]'$ Chest pain   '[]'$ Chest pressure   '[]'$ Palpitations   '[x]'$ Shortness of breath when  laying flat   '[]'$ Shortness of breath at rest   '[x]'$ Shortness of breath with exertion. Vascular:  '[]'$ Pain in legs with walking   '[]'$ Pain in legs at rest   '[]'$ Pain in legs when laying flat   '[]'$ Claudication   '[]'$ Pain in feet when walking  '[]'$ Pain in feet at rest  '[]'$ Pain in feet when laying flat   '[]'$ History of DVT   '[]'$ Phlebitis   '[]'$ Swelling in legs   '[]'$ Varicose veins   '[]'$ Non-healing ulcers Pulmonary:   '[]'$ Uses home oxygen   '[]'$ Productive cough   '[]'$ Hemoptysis   '[]'$ Wheeze  '[]'$ COPD   '[]'$ Asthma Neurologic:  '[]'$ Dizziness  '[]'$ Blackouts   '[]'$ Seizures   '[]'$ History of stroke   '[]'$ History of TIA  '[]'$ Aphasia   '[]'$ Temporary blindness   '[]'$ Dysphagia   '[]'$ Weakness or numbness in arms   '[]'$ Weakness or numbness in legs Musculoskeletal:  '[]'$ Arthritis   '[]'$ Joint swelling   '[]'$ Joint pain   '[]'$ Low back pain Hematologic:  '[]'$ Easy bruising  '[]'$ Easy bleeding   '[]'$ Hypercoagulable state   '[]'$ Anemic  '[]'$ Hepatitis Gastrointestinal:  '[]'$ Blood in stool   '[]'$ Vomiting blood  '[]'$ Gastroesophageal reflux/heartburn   '[]'$ Difficulty swallowing. Genitourinary:  '[]'$ Chronic kidney disease   '[]'$ Difficult urination  '[]'$ Frequent urination  '[]'$ Burning with urination   '[]'$ Blood in urine Skin:  '[]'$ Rashes   '[]'$ Ulcers   '[]'$ Wounds Psychological:  '[]'$ History of anxiety   '[]'$  History of major depression.  Physical Examination  Vitals:   10/13/21 0900 10/13/21 1000 10/13/21 1100 10/13/21 1200  BP: 129/72 130/73 (!) 143/72 (!) 161/81  Pulse: (!) 58 66 (!) 56 63  Resp: '11 13 11 14  '$ Temp:    98.6 F (37 C)  TempSrc:    Oral  SpO2: 96% 98% 95% 96%  Weight:      Height:       Body mass index is 22.53 kg/m. Gen:  WD/WN, NAD Head: /AT, No temporalis wasting. Prominent temp pulse not noted. Ear/Nose/Throat: Hearing grossly intact, nares w/o erythema or drainage, oropharynx w/o Erythema/Exudate Eyes: Sclera non-icteric, conjunctiva clear Neck: Trachea midline.  No JVD.  Pulmonary:  Good air movement, respirations not labored, equal bilaterally.  Cardiac: RRR, normal S1, S2. Vascular:   Vessel Right Left  Radial Palpable Palpable   Gastrointestinal: soft, non-tender/non-distended. No guarding/reflex.  Musculoskeletal: M/S 5/5 throughout.  Extremities without ischemic changes.  No deformity or atrophy. No edema. Neurologic: Sensation grossly intact in extremities.  Symmetrical.  Speech is fluent. Motor exam as listed above. Psychiatric: Judgment intact, Mood & affect appropriate for pt's clinical situation. Dermatologic:  No rashes or ulcers noted.  No cellulitis or open wounds. Lymph : No Cervical, Axillary, or Inguinal lymphadenopathy.    CBC Lab Results  Component Value Date   WBC 15.1 (H) 10/13/2021   HGB 12.9 (L) 10/13/2021   HCT 39.4 10/13/2021   MCV 91.2 10/13/2021   PLT 256 10/13/2021    BMET    Component Value Date/Time   NA 140 10/13/2021 0146   K 3.9 10/13/2021 0146   CL 107 10/13/2021 0146   CO2 27 10/13/2021 0146   GLUCOSE 102 (H) 10/13/2021 0146   BUN 15 10/13/2021 0146   CREATININE 1.01 10/13/2021 0146   CALCIUM 9.0 10/13/2021 0146   GFRNONAA >60 10/13/2021 0146   GFRAA >60 11/24/2019 1951   Estimated Creatinine Clearance: 86.5 mL/min (by C-G formula based on SCr of 1.01 mg/dL).  COAG Lab Results  Component Value Date   INR 0.9 10/12/2021    Radiology DG Chest 2 View  Result Date: 10/12/2021 CLINICAL DATA:  Shortness of breath, left rib and flank pain EXAM: CHEST - 2 VIEW COMPARISON:  None Available. 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 Normal heart size and vascularity. Negative for pneumonia, edema, effusion or pneumothorax. Trachea midline. Aorta atherosclerotic. No acute osseous finding. Lower thoracic fusion hardware partially imaged. IMPRESSION: Hyperinflation.  No superimposed acute process by plain radiography. Electronically Signed   By: Jerilynn Mages.  Shick M.D.   On: 10/12/2021 15:52   CT Angio Chest PE W and/or Wo Contrast  Result Date: 10/12/2021 CLINICAL DATA:  Pulmonary embolism (PE)  suspected, positive D-dimer; Flank pain, kidney stone suspected EXAM: CT ANGIOGRAPHY CHEST CT ABDOMEN AND PELVIS WITH CONTRAST TECHNIQUE: Multidetector CT imaging of the chest was performed using the standard protocol during bolus administration of intravenous contrast. Multiplanar CT image reconstructions and MIPs were obtained to evaluate the vascular anatomy. Multidetector CT imaging of the abdomen and pelvis was performed using the standard protocol during bolus administration of intravenous contrast. RADIATION DOSE REDUCTION: This exam was performed according to the departmental dose-optimization program which includes automated exposure control, adjustment of the mA and/or kV according to patient size and/or use of iterative reconstruction technique. CONTRAST:  134m OMNIPAQUE IOHEXOL 350 MG/ML SOLN COMPARISON:  CT abdomen pelvis 10/08/2020, CT lumbar spine 07/16/2021 FINDINGS: CTA CHEST FINDINGS Cardiovascular: Adequate opacification of the pulmonary arteries. Filling defects involving segmental branch pulmonary arteries of the left lower lobe (series 5, image 74; series 2, image 83). Additional PE involving a segmental left upper lobe branch (series 2, image 60). No lobar or central pulmonary embolism. Elevated RV to LV ratio of 1.2. Heart size is normal. No pericardial effusion. Thoracic aorta is nonaneurysmal. Scattered atherosclerotic calcifications of the aorta and coronary arteries. Mediastinum/Nodes: No enlarged mediastinal, hilar, or axillary lymph nodes. Thyroid gland, trachea, and esophagus demonstrate no significant findings. Lungs/Pleura: Mild dependent subsegmental bibasilar atelectasis. Lungs are otherwise clear. No pleural effusion or pneumothorax. Musculoskeletal: No chest wall abnormality. No acute or significant osseous findings. Review of the MIP images confirms the above findings. CT ABDOMEN and PELVIS FINDINGS Hepatobiliary: Prior cholecystectomy. Persistent pneumobilia within the liver.  Chronically dilated common bile duct, also with pneumobilia. No focal liver lesion identified. Pancreas: Unremarkable. No pancreatic ductal dilatation or surrounding inflammatory changes. Spleen: Normal in size without focal abnormality. Adrenals/Urinary Tract: Adrenal glands are unremarkable. Kidneys are normal, without renal calculi, solid lesion, or hydronephrosis. Bladder is unremarkable. Stomach/Bowel: Stomach is within normal limits. No evidence of bowel wall thickening, distention, or inflammatory changes. Vascular/Lymphatic:  Aortic atherosclerosis. No enlarged abdominal or pelvic lymph nodes. Reproductive: Prostate is unremarkable. Other: No free fluid. No abdominopelvic fluid collection. No pneumoperitoneum. No abdominal wall hernia. Musculoskeletal: Extensive thoraco lumbar to bi-iliac spinal fusion. Similar appearance of L4 vertebral body compression fracture. Unchanged anterolisthesis of L3 on L4 and L2 on L3. Similar degree of perihardware lucency adjacent to the screws at T10. No new or acute bony findings. Review of the MIP images confirms the above findings. IMPRESSION: 1. Acute pulmonary emboli involving segmental branch pulmonary arteries of the left lower lobe and left upper lobe. Elevated RV to LV ratio of 1.2. 2. Lungs are clear. 3. No acute abdominopelvic process. 4. Additional chronic and/or incidental findings, as above. 5. Aortic atherosclerosis (ICD10-I70.0). These results were called by telephone at the time of interpretation on 10/12/2021 at 8:00 pm to provider PHILLIP STAFFORD , who verbally acknowledged these results. Electronically Signed   By: Davina Poke D.O.   On: 10/12/2021 20:06   CT ABDOMEN PELVIS W CONTRAST  Result Date: 10/12/2021 CLINICAL DATA:  Pulmonary embolism (PE) suspected, positive D-dimer; Flank pain, kidney stone suspected EXAM: CT ANGIOGRAPHY CHEST CT ABDOMEN AND PELVIS WITH CONTRAST TECHNIQUE: Multidetector CT imaging of the chest was performed using the  standard protocol during bolus administration of intravenous contrast. Multiplanar CT image reconstructions and MIPs were obtained to evaluate the vascular anatomy. Multidetector CT imaging of the abdomen and pelvis was performed using the standard protocol during bolus administration of intravenous contrast. RADIATION DOSE REDUCTION: This exam was performed according to the departmental dose-optimization program which includes automated exposure control, adjustment of the mA and/or kV according to patient size and/or use of iterative reconstruction technique. CONTRAST:  181m OMNIPAQUE IOHEXOL 350 MG/ML SOLN COMPARISON:  CT abdomen pelvis 10/08/2020, CT lumbar spine 07/16/2021 FINDINGS: CTA CHEST FINDINGS Cardiovascular: Adequate opacification of the pulmonary arteries. Filling defects involving segmental branch pulmonary arteries of the left lower lobe (series 5, image 74; series 2, image 83). Additional PE involving a segmental left upper lobe branch (series 2, image 60). No lobar or central pulmonary embolism. Elevated RV to LV ratio of 1.2. Heart size is normal. No pericardial effusion. Thoracic aorta is nonaneurysmal. Scattered atherosclerotic calcifications of the aorta and coronary arteries. Mediastinum/Nodes: No enlarged mediastinal, hilar, or axillary lymph nodes. Thyroid gland, trachea, and esophagus demonstrate no significant findings. Lungs/Pleura: Mild dependent subsegmental bibasilar atelectasis. Lungs are otherwise clear. No pleural effusion or pneumothorax. Musculoskeletal: No chest wall abnormality. No acute or significant osseous findings. Review of the MIP images confirms the above findings. CT ABDOMEN and PELVIS FINDINGS Hepatobiliary: Prior cholecystectomy. Persistent pneumobilia within the liver. Chronically dilated common bile duct, also with pneumobilia. No focal liver lesion identified. Pancreas: Unremarkable. No pancreatic ductal dilatation or surrounding inflammatory changes. Spleen:  Normal in size without focal abnormality. Adrenals/Urinary Tract: Adrenal glands are unremarkable. Kidneys are normal, without renal calculi, solid lesion, or hydronephrosis. Bladder is unremarkable. Stomach/Bowel: Stomach is within normal limits. No evidence of bowel wall thickening, distention, or inflammatory changes. Vascular/Lymphatic: Aortic atherosclerosis. No enlarged abdominal or pelvic lymph nodes. Reproductive: Prostate is unremarkable. Other: No free fluid. No abdominopelvic fluid collection. No pneumoperitoneum. No abdominal wall hernia. Musculoskeletal: Extensive thoraco lumbar to bi-iliac spinal fusion. Similar appearance of L4 vertebral body compression fracture. Unchanged anterolisthesis of L3 on L4 and L2 on L3. Similar degree of perihardware lucency adjacent to the screws at T10. No new or acute bony findings. Review of the MIP images confirms the above findings. IMPRESSION: 1. Acute pulmonary emboli  involving segmental branch pulmonary arteries of the left lower lobe and left upper lobe. Elevated RV to LV ratio of 1.2. 2. Lungs are clear. 3. No acute abdominopelvic process. 4. Additional chronic and/or incidental findings, as above. 5. Aortic atherosclerosis (ICD10-I70.0). These results were called by telephone at the time of interpretation on 10/12/2021 at 8:00 pm to provider PHILLIP STAFFORD , who verbally acknowledged these results. Electronically Signed   By: Davina Poke D.O.   On: 10/12/2021 20:06      Assessment/Plan 1.  Pulmonary embolism The patient does not have a significant Neri embolism however there is some evidence of mild right heart strain.  The patient does have continued chest pain although he does not have much hemodynamic compromise.  I discussed with the the procedure for undergoing a pulmonary thrombectomy.  I discussed that we may not be able to retrieve significant amounts of clot and there may not be significant difference in his symptoms however it may help  with residual issues in the future.  Following the discussion of risk, benefits and alternatives the patient is agreeable to proceed with pulmonary thrombectomy.  Given that the patient has previously had a DVT, it is recommended that the patient remain on anticoagulation for at least 1 year.  Because the patient does not seem to have any clear inciting causes in this instance, hypercoagulable study is also recommended.  2.  Deep vein thrombosis The patient had a DVT in his left lower extremity following back surgery in October of last year.  Follow-up ultrasound in December showed resolution of the DVT.  Given that patient had leg pain at presentation it would be prudent to evaluate for possible new DVT as a source of his pulmonary embolism.  Orders placed.  3.  GERD with esophagitis Continue with PPI therapy  Family Communication:   Kris Hartmann, NP [facility] [phone] (Office Phone) [phone] (Office Fax)  10/13/2021 1:52 PM    This note was created with Dragon medical transcription system.  Any error is purely unintentional

## 2021-10-13 NOTE — Assessment & Plan Note (Signed)
-   We will continue his Effexor XR or and Remeron.

## 2021-10-13 NOTE — Plan of Care (Signed)
  Problem: Education: Goal: Knowledge of General Education information will improve Description: Including pain rating scale, medication(s)/side effects and non-pharmacologic comfort measures Outcome: Progressing   Problem: Clinical Measurements: Goal: Will remain free from infection Outcome: Progressing   Problem: Clinical Measurements: Goal: Respiratory complications will improve Outcome: Progressing   Problem: Clinical Measurements: Goal: Cardiovascular complication will be avoided Outcome: Progressing   Problem: Activity: Goal: Risk for activity intolerance will decrease Outcome: Progressing   Problem: Pain Managment: Goal: General experience of comfort will improve Outcome: Progressing   

## 2021-10-13 NOTE — TOC Initial Note (Signed)
Transition of Care [facility]) - Initial/Assessment Note    Patient Details  Name: Earl Murray MRN: [id number] Date of Birth: [birth year]  Transition of [facility] CM/SW Contact:    Candie Chroman, LCSW Phone Number: 10/13/2021, 12:27 PM  Clinical Narrative:  Readmission prevention screen complete. CSW introduced role and explained that discharge planning would be discussed. PCP is Dr. Tamala Julian at the [facility]. Patient uses the [place] pharmacy. At discharge he wants medications sent to [facility] in [place]. No issues obtaining medications. No home health or DME use prior to admission. No further concerns. CSW encouraged patient to contact CSW as needed. CSW will continue to follow patient for support and facilitate return home once stable. Daughter will transport him home at discharge.  Expected Discharge Plan: Home/Self Care Barriers to Discharge: Continued Medical Work up   Patient Goals and CMS Choice        Expected Discharge Plan and Services Expected Discharge Plan: Home/Self Care     Post Acute Care Choice: NA Living arrangements for the past 2 months: Single Family Home                                      Prior Living Arrangements/Services Living arrangements for the past 2 months: Single Family Home Lives with:: Spouse Patient language and need for interpreter reviewed:: Yes Do you feel safe going back to the place where you live?: Yes      Need for Family Participation in Patient Care: Yes (Comment) Care giver support system in place?: Yes (comment)   Criminal Activity/Legal Involvement Pertinent to Current Situation/Hospitalization: No - Comment as needed  Activities of Daily Living Home Assistive Devices/Equipment: None ADL Screening (condition at time of admission) Patient's cognitive ability adequate to safely complete daily activities?: Yes Is the patient deaf or have difficulty hearing?: No Does the patient have difficulty seeing, even when wearing  glasses/contacts?: No Does the patient have difficulty concentrating, remembering, or making decisions?: No Patient able to express need for assistance with ADLs?: Yes Does the patient have difficulty dressing or bathing?: No Independently performs ADLs?: Yes (appropriate for developmental age) Does the patient have difficulty walking or climbing stairs?: No Weakness of Legs: None Weakness of Arms/Hands: None  Permission Sought/Granted                  Emotional Assessment Appearance:: Appears stated age Attitude/Demeanor/Rapport: Engaged, Gracious Affect (typically observed): Accepting, Appropriate, Calm, Pleasant Orientation: : Oriented to Self, Oriented to Place, Oriented to  Time, Oriented to Situation Alcohol / Substance Use: Not Applicable Psych Involvement: No (comment)  Admission diagnosis:  Acute pulmonary embolism ([place]) [I26.99] Other acute pulmonary embolism, unspecified whether acute cor pulmonale present ([place]) [I26.99] Patient Active Problem List   Diagnosis Date Noted   Dyslipidemia 10/13/2021   GERD without esophagitis 10/13/2021   Depression 10/13/2021   Hypertensive urgency 10/13/2021   Acute pulmonary embolism ([place]) 10/12/2021   Allergic rhinitis 04/25/2021   Bladder cancer ([place]) 04/25/2021   Closed fracture of metatarsal bone 04/25/2021   Generalized abdominal pain 04/25/2021   Gross hematuria 04/25/2021   Lateral epicondylitis of elbow 04/25/2021   Major depressive disorder, recurrent, unspecified ([place]) 04/25/2021   Knee pain 04/25/2021   Migraine 04/25/2021   Cervicalgia 04/25/2021   Neuroma of foot 04/25/2021   Opioid dependence in remission ([place]) 04/25/2021   Encounter for screening for malignant neoplasm of respiratory organs 04/25/2021  Pain, unspecified 04/25/2021   Pancreatitis 04/25/2021   Paresthesia of skin 04/25/2021   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   Sciatica 04/25/2021   Syncope and collapse 04/25/2021   Tobacco use  04/25/2021   Diarrhea, unspecified 03/25/2021   Degeneration of lumbosacral intervertebral disc 03/25/2021   DDD (degenerative disc disease), cervical 03/25/2021   Chronic sinusitis 03/25/2021   Family history of malignant neoplasm of digestive organs 03/25/2021   Gallbladder disease 03/25/2021   DVT (deep venous thrombosis) ([place]) 03/25/2021   Prediabetes 02/05/2021   Stage 3a chronic kidney disease ([place]) 02/05/2021   Cocaine dependence in remission ([place]) 01/31/2021   Hyperlipidemia 01/31/2021   Obstructive sleep apnea 01/31/2021   Other and unspecified alcohol dependence, in remission 01/31/2021   Other, mixed, or unspecified nondependent drug abuse, unspecified 01/31/2021   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/2021   Scoliosis of thoracic spine 01/31/2021   Abnormal magnetic resonance imaging of liver    Choledocholithiasis 10/25/2019   Hypertension    Lumbar radiculopathy 09/15/2018   Displacement of lumbar intervertebral disc without myelopathy 09/12/2018   Cervical radiculitis 09/12/2018   Hallux valgus, acquired 04/20/2016   Hammer toes of both feet 04/20/2016   Stress reaction 04/20/2016   Backache 05/25/1998   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   PCP:  Center, [place]:   [facility] [place], [place] [facility] [address] [place] [address] Phone: [phone] Fax: [phone]     Social Determinants of Health (SDOH) Interventions    Readmission Risk Interventions    10/13/2021   12:23 PM  Readmission Risk Prevention Plan  Transportation Screening Complete  PCP or Specialist Appt within 3-5 Days Complete  Social Work Consult for [place] Planning/Counseling Complete  Palliative Care Screening Not Applicable  Medication Review Press photographer) Complete

## 2021-10-13 NOTE — Consult Note (Signed)
[place] for IV Heparin Indication: pulmonary embolus  Patient Measurements: Height: '6\' 5"'$  (195.6 cm) Weight: 86.2 kg (190 lb) IBW/kg (Calculated) : 89.1 Heparin Dosing Weight: 86.2 kg  Labs: Recent Labs    10/12/21 1435 10/12/21 1635 10/12/21 2045 10/13/21 0146  HGB 14.2  --   --  12.9*  HCT 42.8  --   --  39.4  PLT 295  --   --  256  APTT  --   --  24  --   LABPROT  --   --  12.5  --   INR  --   --  0.9  --   HEPARINUNFRC  --   --  <0.10* >1.10*  CREATININE 1.22  --   --  1.01  TROPONINIHS 6 7  --   --      Estimated Creatinine Clearance: 86.5 mL/min (by C-G formula based on SCr of 1.01 mg/dL).  Medical History: Past Medical History:  Diagnosis Date   Hypertension    Neck injury    Medications:  *Noted Eliquis as PTA medication. Patient reports taking last dose ~ 1 week ago*  Baseline HL < 0.10, aPTT 24s, INR 0.9  Assessment: Noted history of DVT, cocaine use, opioid dependence on daily OxyContin, and CKD. [facility] care with [facility]. Pharmacy consulted to manage heparin infusion for new PE.  Goal of Therapy:  Heparin level 0.3-0.7 units/ml Monitor platelets by anticoagulation protocol: Yes   Plan:  --Heparin level is supratherapeutic --Hold heparin infusion x 1 hour an then decrease rate to 1000 units/hr --Re-check HL 6 hours after re-initiation of infusion --Daily CBC per protocol while on IV heparin  Benita Gutter  10/13/2021 11:48 AM

## 2021-10-13 NOTE — Progress Notes (Addendum)
Nutrition Brief Note  Patient identified on the Malnutrition Screening Tool (MST) Report  Wt Readings from Last 15 Encounters:  10/13/21 86.2 kg  09/12/21 90.7 kg  08/17/21 90.7 kg  07/29/21 90.7 kg  07/16/21 90.7 kg  04/25/21 90.7 kg  03/25/21 87.9 kg  12/15/20 90.7 kg  12/13/20 90.7 kg  10/08/20 90.7 kg  10/03/20 90.7 kg  09/19/20 90.7 kg  08/31/20 90.7 kg  08/30/20 91 kg  08/09/20 90.7 kg   Pt with medical history significant for essential hypertension and history of DVT after back surgery in September of last year s/p anticoagulation for 3 months, presented with acute onset of left-sided upper and mid back pain worsening with deep breathing.   Pt admitted with acute pulmonary embolism.   Reviewed I/O's: +1.7 L x 24 hours  UOP: 300 ml x 24 hours  Spoke with pt, who reports feeling poorly due to pain control. He reports he was in his usual state of health until yesterday. Per pt, he has a good appetite and usually "eats all of the time". Pt admits that he grazes throughout the day.  Pt reports his UBW is around 200#> He admits to a voluntary 10# wt loss over the past months, which he attributes to help his pain and mobility after back surgery. Reviewed wt hx; pt has experienced a 5% wt loss over the past 3 months, which is not significant for time frame.   Nutrition-Focused physical exam completed. Findings are no fat depletion, no muscle depletion, and no edema.    Pt NPO for vascular surgery evaluation.  Medications reviewed and include heparin @ 12 ml/hr and 0.9% sodium chloride infusion @ 100 ml/hr.   Current diet order is NPO, patient is consuming approximately n/a% of meals at this time. Labs and medications reviewed.   No nutrition interventions warranted at this time. If nutrition issues arise, please consult RD.   Loistine Chance, RD, LDN, [place] Registered Dietitian II Certified Diabetes Care and Education Specialist Please refer to [facility] for RD and/or RD  on-call/weekend/after hours pager

## 2021-10-14 ENCOUNTER — Inpatient Hospital Stay: Payer: Medicare Other

## 2021-10-14 ENCOUNTER — Encounter
Admission: EM | Disposition: A | Discharge status: Home / Self Care | Payer: Self-pay | Source: Home / Self Care | Attending: Internal Medicine

## 2021-10-14 DIAGNOSIS — I2609 Other pulmonary embolism with acute cor pulmonale: Secondary | ICD-10-CM | POA: Diagnosis not present

## 2021-10-14 HISTORY — PX: PULMONARY THROMBECTOMY: CATH118295

## 2021-10-14 LAB — CBC
HCT: 44.5 % (ref 39.0–52.0)
Hemoglobin: 14.7 g/dL (ref 13.0–17.0)
MCH: 30.1 pg (ref 26.0–34.0)
MCHC: 33 g/dL (ref 30.0–36.0)
MCV: 91 fL (ref 80.0–100.0)
Platelets: 289 10*3/uL (ref 150–400)
RBC: 4.89 MIL/uL (ref 4.22–5.81)
RDW: 17.5 % — ABNORMAL HIGH (ref 11.5–15.5)
WBC: 10.9 10*3/uL — ABNORMAL HIGH (ref 4.0–10.5)
nRBC: 0 % (ref 0.0–0.2)

## 2021-10-14 LAB — HEPARIN LEVEL (UNFRACTIONATED)
Heparin Unfractionated: 0.73 IU/mL — ABNORMAL HIGH (ref 0.30–0.70)
Heparin Unfractionated: 0.41 IU/mL (ref 0.30–0.70)

## 2021-10-14 IMAGING — US US EXTREM LOW VENOUS
1 series · 13 of 24 positions shown · non-contrast
Comparison: CTA PE and CT AP [DATE].

CLINICAL DATA: Lower extremity pain.  POSITIVE PE.



[Series 1: us venous img lower bilat (dvt) · portal-venous · 13 of 58 slices shown]
[im 1/58]
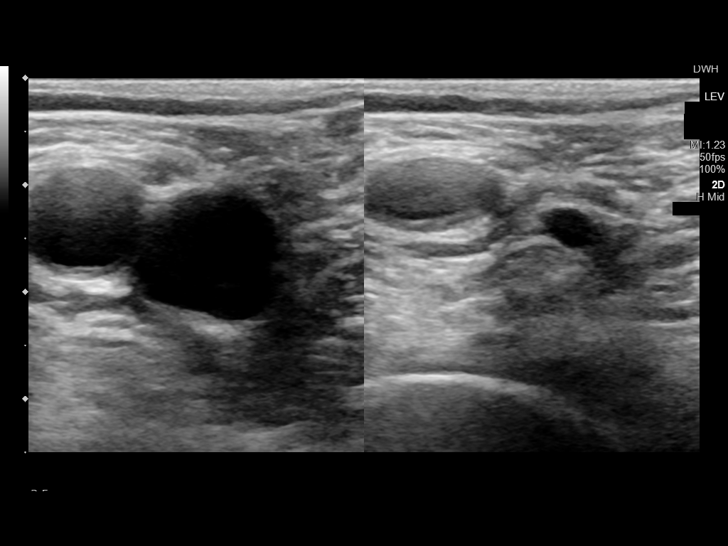
[im 5/58]
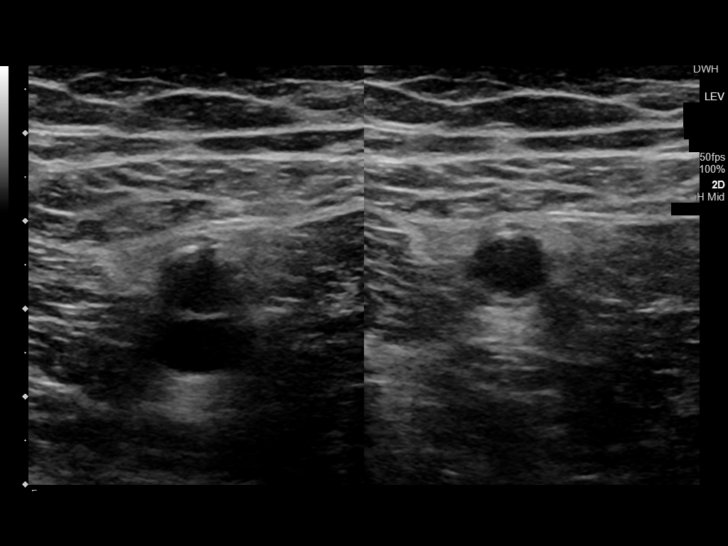
[im 10/58]
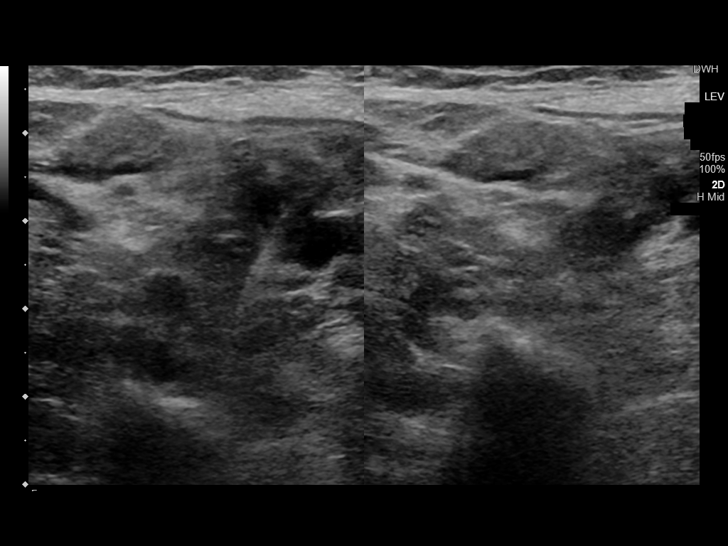
[im 15/58]
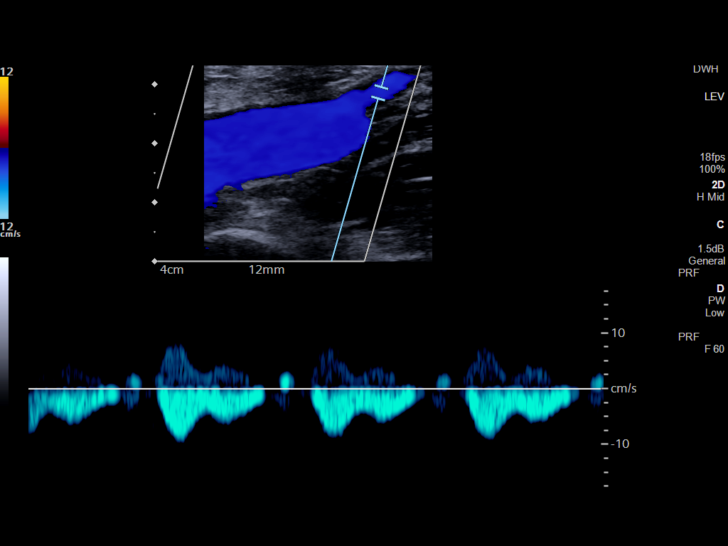
[im 20/58]
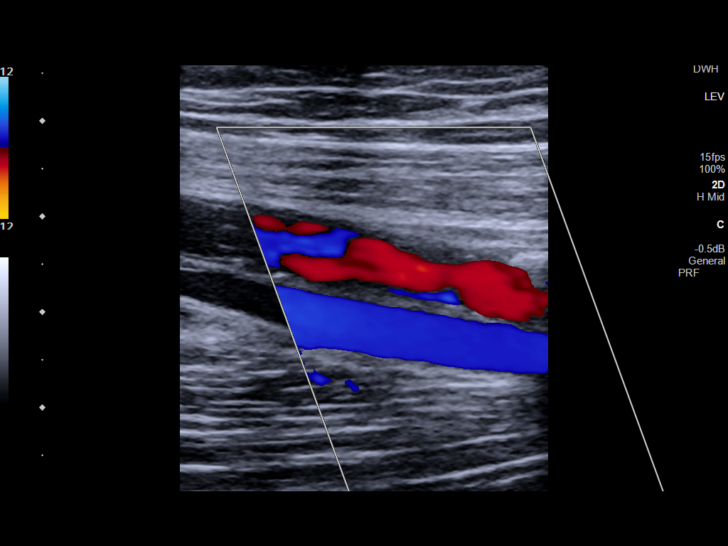
[im 25/58]
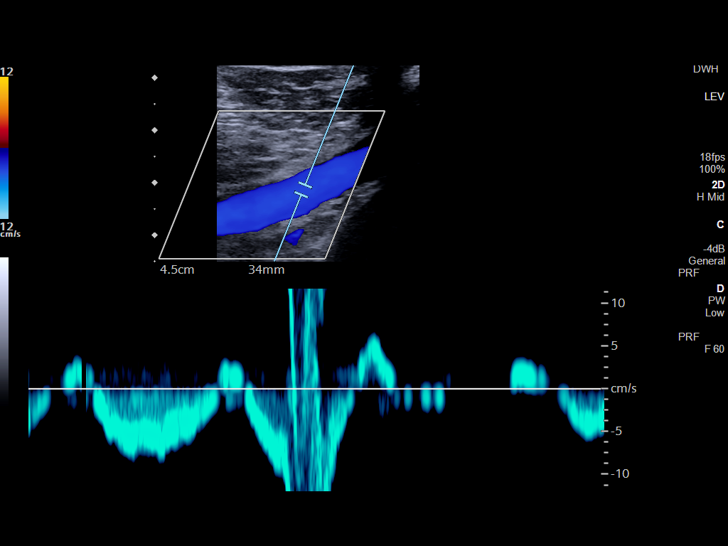
[im 30/58]
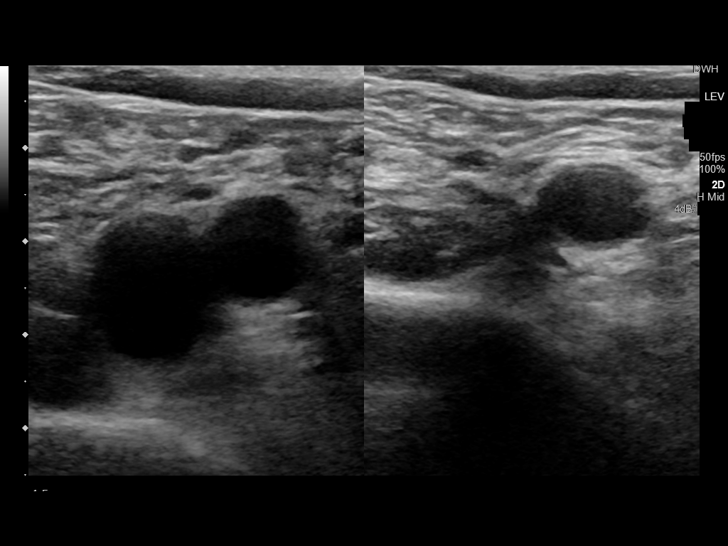
[im 33/58]
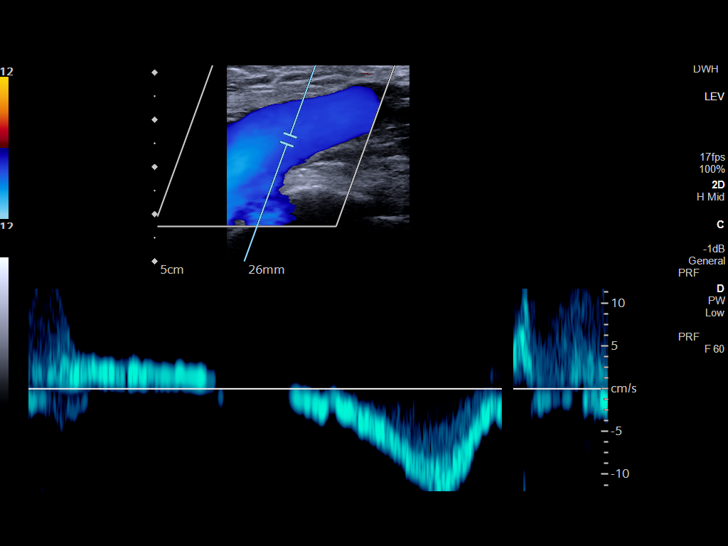
[im 38/58]
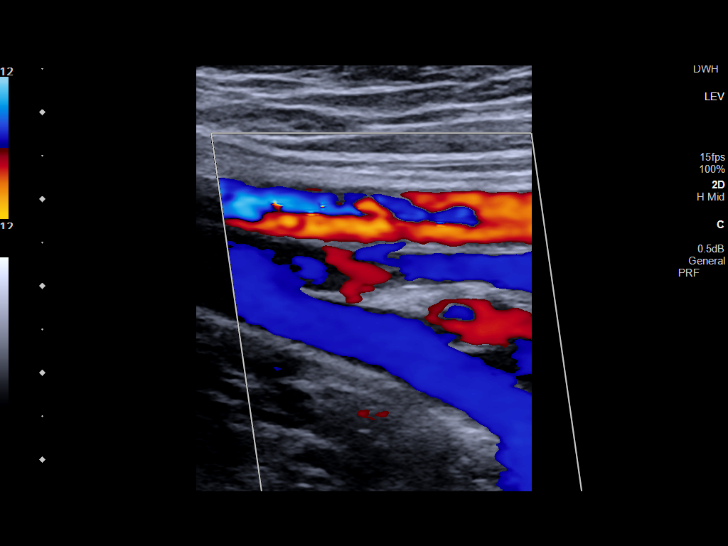
[im 43/58]
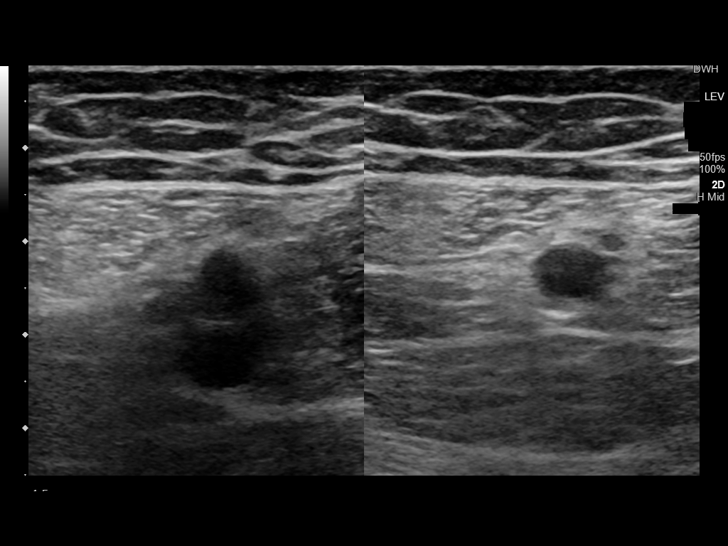
[im 48/58]
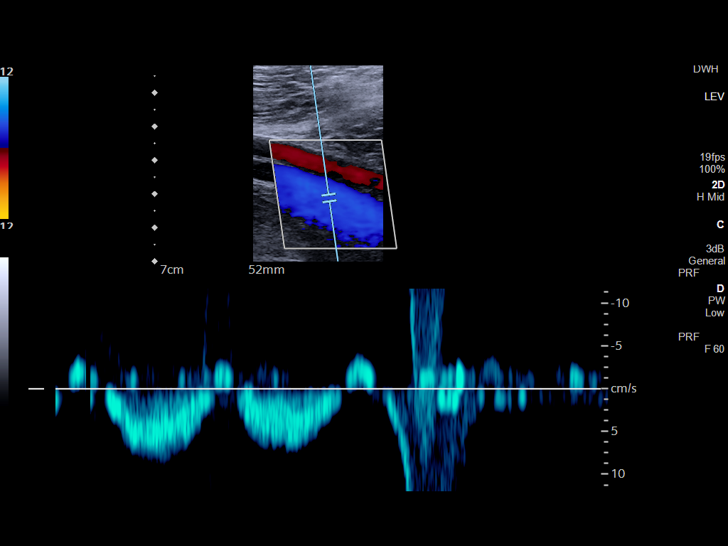
[im 53/58]
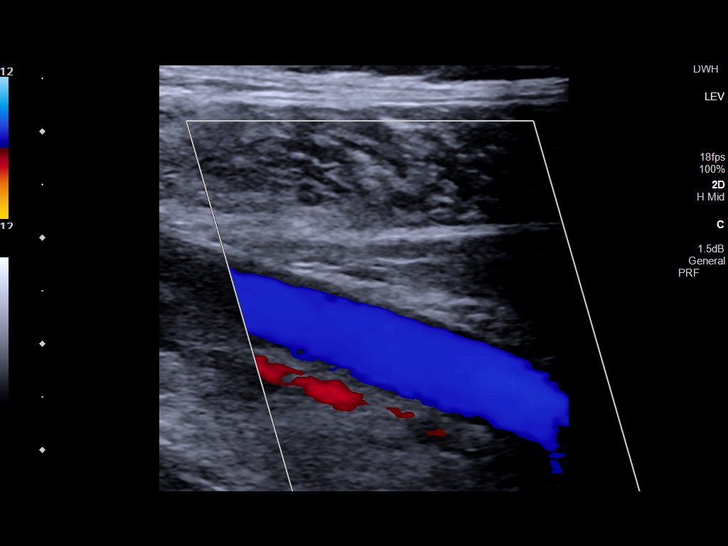
[im 58/58]
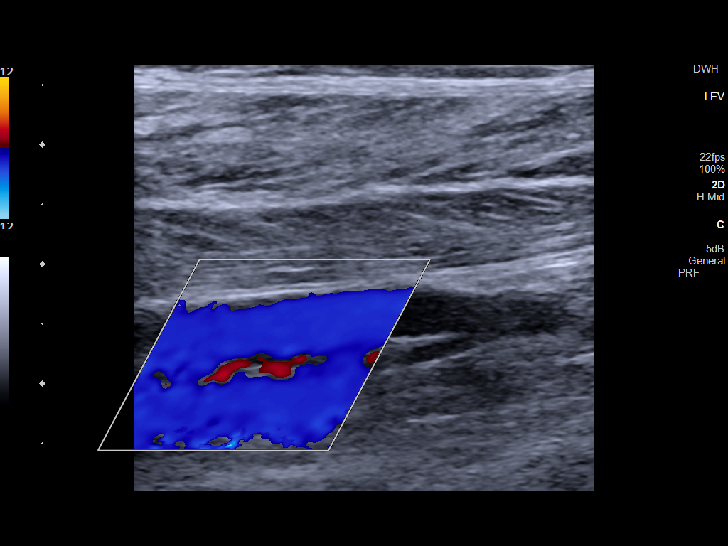

[13 of 24 positions shown; findings below may reference images not displayed]

FINDINGS: RIGHT LOWER EXTREMITY

VENOUS

Normal compressibility of the RIGHT common femoral, superficial
femoral, and popliteal veins, as well as the visualized calf veins.
Visualized portions of profunda femoral vein and great saphenous
vein unremarkable. No filling defects to suggest DVT on grayscale or
color Doppler imaging. Doppler waveforms show normal direction of
venous flow, normal respiratory plasticity and response to
augmentation.

OTHER

No evidence of superficial thrombophlebitis or abnormal fluid
collection.

Limitations: none

LEFT LOWER EXTREMITY

VENOUS

Normal compressibility of the LEFT common femoral, superficial
femoral, and popliteal veins, as well as the visualized calf veins.
Visualized portions of profunda femoral vein and great saphenous
vein unremarkable. No filling defects to suggest DVT on grayscale or
color Doppler imaging. Doppler waveforms show normal direction of
venous flow, normal respiratory plasticity and response to
augmentation.

OTHER

No evidence of superficial thrombophlebitis or abnormal fluid
collection.

Limitations: none
IMPRESSION: No evidence of femoropopliteal DVT within either lower extremity.

## 2021-10-14 SURGERY — PULMONARY THROMBECTOMY
Anesthesia: Moderate Sedation

## 2021-10-14 MED ORDER — HYDROMORPHONE HCL 1 MG/ML IJ SOLN
INTRAMUSCULAR | Status: AC
  Administered 2021-10-14: 1 mg via INTRAVENOUS
  Filled 2021-10-14: qty 1

## 2021-10-14 MED ORDER — CEFAZOLIN SODIUM-DEXTROSE 2-4 GM/100ML-% IV SOLN: 2.0000 g | INTRAVENOUS | Status: DC

## 2021-10-14 MED ORDER — IODIXANOL 320 MG/ML IV SOLN
INTRAVENOUS | Status: DC | PRN
  Administered 2021-10-14: 30 mL

## 2021-10-14 MED ORDER — MIDAZOLAM HCL 2 MG/2ML IJ SOLN
INTRAMUSCULAR | Status: DC | PRN
  Administered 2021-10-14: 1 mg via INTRAVENOUS
  Administered 2021-10-14: 2 mg via INTRAVENOUS

## 2021-10-14 MED ORDER — HEPARIN SODIUM (PORCINE) 1000 UNIT/ML IJ SOLN
INTRAMUSCULAR | Status: AC
  Filled 2021-10-14: qty 10

## 2021-10-14 MED ORDER — FENTANYL CITRATE PF 50 MCG/ML IJ SOSY
PREFILLED_SYRINGE | INTRAMUSCULAR | Status: AC
  Filled 2021-10-14: qty 2

## 2021-10-14 MED ORDER — FAMOTIDINE 20 MG PO TABS: 40.0000 mg | ORAL_TABLET | Freq: Once | ORAL | Status: DC | PRN

## 2021-10-14 MED ORDER — HEPARIN (PORCINE) 25000 UT/250ML-% IV SOLN: 800.0000 [IU]/h | INTRAVENOUS | Status: DC

## 2021-10-14 MED ORDER — MIDAZOLAM HCL 2 MG/ML PO SYRP: 8.0000 mg | ORAL_SOLUTION | Freq: Once | ORAL | Status: DC | PRN

## 2021-10-14 MED ORDER — HEPARIN (PORCINE) 25000 UT/250ML-% IV SOLN
850.0000 [IU]/h | INTRAVENOUS | Status: DC
  Administered 2021-10-14 (×2): 800 [IU]/h via INTRAVENOUS
  Filled 2021-10-14: qty 250

## 2021-10-14 MED ORDER — FENTANYL CITRATE (PF) 100 MCG/2ML IJ SOLN
INTRAMUSCULAR | Status: DC | PRN
  Administered 2021-10-14 (×2): 50 ug via INTRAVENOUS

## 2021-10-14 MED ORDER — DIPHENHYDRAMINE HCL 50 MG/ML IJ SOLN: 50.0000 mg | Freq: Once | INTRAMUSCULAR | Status: DC | PRN

## 2021-10-14 MED ORDER — SODIUM CHLORIDE 0.9 % IV SOLN: INTRAVENOUS | Status: DC

## 2021-10-14 MED ORDER — MIDAZOLAM HCL 5 MG/5ML IJ SOLN
INTRAMUSCULAR | Status: AC
  Filled 2021-10-14: qty 5

## 2021-10-14 MED ORDER — METHYLPREDNISOLONE SODIUM SUCC 125 MG IJ SOLR: 125.0000 mg | Freq: Once | INTRAMUSCULAR | Status: DC | PRN

## 2021-10-14 MED ORDER — CEFAZOLIN SODIUM-DEXTROSE 2-4 GM/100ML-% IV SOLN
INTRAVENOUS | Status: AC
Start: 2021-10-14 — End: 2021-10-14
  Filled 2021-10-14: qty 100

## 2021-10-14 MED ORDER — CEFAZOLIN SODIUM-DEXTROSE 1-4 GM/50ML-% IV SOLN
INTRAVENOUS | Status: DC | PRN
  Administered 2021-10-14: 2 g via INTRAVENOUS

## 2021-10-14 MED ORDER — HYDROMORPHONE HCL 1 MG/ML IJ SOLN: 1.0000 mg | Freq: Once | INTRAMUSCULAR | Status: AC | PRN

## 2021-10-14 MED ORDER — HEPARIN SODIUM (PORCINE) 1000 UNIT/ML IJ SOLN
INTRAMUSCULAR | Status: DC | PRN
Start: 2021-10-14 — End: 2021-10-14
  Administered 2021-10-14: 4000 [IU] via INTRAVENOUS

## 2021-10-14 MED ORDER — DICYCLOMINE HCL 20 MG PO TABS: 20.0000 mg | ORAL_TABLET | Freq: Three times a day (TID) | ORAL | Status: DC | PRN

## 2021-10-14 MED ORDER — ONDANSETRON HCL 4 MG/2ML IJ SOLN: 4.0000 mg | Freq: Four times a day (QID) | INTRAMUSCULAR | Status: DC | PRN

## 2021-10-14 SURGICAL SUPPLY — 15 items
CANISTER PENUMBRA ENGINE (MISCELLANEOUS) ×1 IMPLANT
CATH ANGIO 5F PIGTAIL 100CM (CATHETERS) ×1 IMPLANT
CATH INDIGO SEP 8 (CATHETERS) ×1 IMPLANT
CATH INFINITI JR4 5F (CATHETERS) ×1 IMPLANT
CATH LIGHTNING 8 XTORQ 115 (CATHETERS) ×1 IMPLANT
GLIDEWIRE ADV .035X260CM (WIRE) ×1 IMPLANT
NDL ENTRY 21GA 7CM ECHOTIP (NEEDLE) IMPLANT
NEEDLE ENTRY 21GA 7CM ECHOTIP (NEEDLE) ×2 IMPLANT
PACK ANGIOGRAPHY (CUSTOM PROCEDURE TRAY) ×2 IMPLANT
SET INTRO CAPELLA COAXIAL (SET/KITS/TRAYS/PACK) ×1 IMPLANT
SHEATH 9FRX11 (SHEATH) ×1 IMPLANT
SYR MEDRAD MARK 7 150ML (SYRINGE) ×1 IMPLANT
TUBING CONTRAST HIGH PRESS 72 (TUBING) ×1 IMPLANT
WIRE GUIDERIGHT .035X150 (WIRE) ×1 IMPLANT
WIRE MAGIC TORQUE 260C (WIRE) ×1 IMPLANT

## 2021-10-14 NOTE — Consult Note (Signed)
ANTICOAGULATION CONSULT NOTE   Pharmacy Consult for IV Heparin Indication: pulmonary embolus  Patient Measurements: Height: '6\' 5"'$  (195.6 cm) Weight: 86.2 kg (190 lb) IBW/kg (Calculated) : 89.1 Heparin Dosing Weight: 86.2 kg  Labs: Recent Labs    10/12/21 1435 10/12/21 1635 10/12/21 2045 10/12/21 2045 10/13/21 0146 10/13/21 1131 10/13/21 2147 10/14/21 0650  HGB 14.2  --   --   --  12.9*  --   --  14.7  HCT 42.8  --   --   --  39.4  --   --  44.5  PLT 295  --   --   --  256  --   --  289  APTT  --   --  24  --   --   --   --   --   LABPROT  --   --  12.5  --   --   --   --   --   INR  --   --  0.9  --   --   --   --   --   HEPARINUNFRC  --   --  <0.10*   < > >1.10* 1.08* 0.74* 0.73*  CREATININE 1.22  --   --   --  1.01  --   --   --   TROPONINIHS 6 7  --   --   --   --   --   --    < > = values in this interval not displayed.     Estimated Creatinine Clearance: 86.5 mL/min (by C-G formula based on SCr of 1.01 mg/dL).  Medical History: Past Medical History:  Diagnosis Date   Hypertension    Neck injury    Medications:  *Noted Eliquis as PTA medication. Patient reports taking last dose ~ 1 week ago*  Baseline HL < 0.10, aPTT 24s, INR 0.9  Assessment: Noted history of DVT, cocaine use, opioid dependence on daily OxyContin, and CKD. [facility] care with [facility]. Pharmacy consulted to manage heparin infusion for new PE.  5/23 0650 HL= 0.73    supratherapeutic, decrease rate from 900 units/hr to 800 unit/hr  Goal of Therapy:  Heparin level 0.3-0.7 units/ml Monitor platelets by anticoagulation protocol: Yes   Plan:  5/23 0650 HL= 0.73    supratherapeutic, decrease rate from 900 units/hr to 800 units/hr and recheck HL 6 hrs after rate change.  CBC daily  Teonna Coonan A  10/14/2021 7:49 AM

## 2021-10-14 NOTE — Op Note (Signed)
[facility]  Percutaneous Study/Intervention Procedural Note   Date of Surgery: 10/14/2021,5:56 PM  Surgeon:Dorotha Hirschi, Dolores Lory   Pre-operative Diagnosis: Symptomatic pulmonary emboli with right heart enlargement severe chest pain  Post-operative diagnosis:  Same  Procedure(s) Performed:  1.  Selective injection left subsegmental pulmonary arteries; left upper lobe and left lower lobe pulmonary arteries.  2.  Mechanical thrombectomy bilateral lobar pulmonary arteries for removal of pulmonary emboli using the Penumbra CAT 8 thrombectomy catheter.     Anesthesia: Conscious sedation was administered under my direct supervision by the interventional radiology RN. IV Versed plus fentanyl were utilized. Continuous ECG, pulse oximetry and blood pressure was monitored throughout the entire procedure.  Conscious sedation was administered for a total of 20 minutes.  Sheath: 9 French 11 cm Pinnacle sheath right common femoral vein antegrade  Contrast: 30 cc   Fluoroscopy Time: 5.2 minutes  Indications:  Patient presented to the hospital with chest pain and shortness of breath. CT angiogram demonstrated left upper lobe and lower lobe pulmonary emboli associated with right heart enlargement.  Given the long-term sequela and the patient's symptomatic condition the risks and benefits for angiography with thrombectomy are reviewed all questions are answered, the patient agrees to proceed.  Procedure:  Earl Murray a 66 y.o. male who was identified and appropriate procedural time out was performed.  The patient was then placed supine on the table and prepped and draped in the usual sterile fashion.  Ultrasound was used to evaluate the right common femoral vein.  It was compressible indicating it is patent .  A digital ultrasound image was acquired for the permanent record.  A micropuncture needle was used to access the right common femoral vein under direct ultrasound guidance.  A  microwire was then advanced under fluoroscopic guidance followed by micro-sheath.  A 0.035 J wire was advanced without resistance and a 5Fr sheath was placed.    4000 units of heparin was then given and allowed to circulate.  The J-wire and pigtail catheter was advanced up to the right atrium where a bolus injection contrast was used to demonstrate the pulmonary artery outflow.  Stiff angled Glidewire was then exchanged for the J-wire and the pigtail catheter was exchanged for JR4 catheter. Using the combination of the stiff angled Glidewire and the JR4 catheter the pulmonary outflow track was selected.  The left main pulmonary artery was evaluated first.  An Amplatz wire was then exchanged for the stiff angle Glidewire and the JR4 catheter was exchanged back to the pigtail catheter.  Then with the catheter in the left pulmonary artery bolus injection contrast was utilized to demonstrate the thrombus as well as the segmental pulmonary artery vasculature. This demonstrated thrombus in the distal left main pulmonary artery extending into the left upper lobe artery as well as the left lower lobe artery.    A Magic torque wire was reintroduced through the pigtail catheter and the pigtail catheter removed.  The Penumbra Cat 8 extra torque catheter was then advanced into the thrombus in the left lower lobe pulmonary artery.  Hand-injection contrast was used to verify the positioning and evaluate the distal anatomy.  Mechanical aspiration was performed using the CAT 8 catheter and a separator.  After multiple passes the catheter was then repositioned into the left upper lobe pulmonary artery and again hand-injection contrast was performed to verify positioning and evaluate the distal anatomy.  Multiple passes were made using mechanical aspiration in association with a separator.  Once there  was free flow of blood from both lobar arteries the catheter was repositioned to the left main pulmonary artery.   Hand-injection contrast was then performed to give an assessment of the left pulmonary vasculature and the effectiveness of thrombectomy.  A bolus of contrast through the penumbra catheter positioned in the proximal left main pulmonary artery was then used to create a final image of the pulmonary vasculature.  After review these images the catheter and sheath were removed and pressure held. There were no immediate complications.  Findings:   Left pulmonary: Initial image demonstrated thrombus within the distalmost aspect of the left main pulmonary artery extending primarily into the left lower lobe pulmonary artery and then the lobar branches.  Thrombus was also noted in the left upper lobe pulmonary artery.  Following multiple passes with the CAT 8 device there appears to be complete resolution of thrombus within the left pulmonary vasculature.    Disposition: Patient was taken to the recovery room in stable condition having tolerated the procedure well.  Earl Murray 10/14/2021,5:56 PM

## 2021-10-14 NOTE — Progress Notes (Signed)
PROGRESS NOTE    Earl Murray  BVQ:[id number] DOB: 08-27-1955 DOA: 10/12/2021 PCP: [facility]    Brief Narrative:   66 y.o. male with medical history significant for essential hypertension and history of DVT after back surgery in September of last year s/p anticoagulation for 3 months, presented to the emergency room with acute onset of left-sided upper and mid back pain worsening with deep breathing since yesterday.  He admits to dyspnea today.  He has been having bilateral lower extremity pain for the last week without significant worsening swelling.  No nausea or vomiting.  Admits to mild diarrhea without fever or chills or abdominal pain.  No dysuria, oliguria or hematuria or flank pain.  Blood pressure control improved.  Remains hemodynamically stable.  Still complaining of significant sharp left-sided chest and flank pain.  Vascular surgery evaluated patient.  Per vascular surgery PE small intervention unlikely to provide much benefit however patient would like to proceed if any benefit as possible.     Assessment & Plan:   Principal Problem:   Acute pulmonary embolism (HCC) Active Problems:   Dyslipidemia   Hypertensive urgency   GERD without esophagitis   Depression  * Acute pulmonary embolism (HCC) Submassive by criteria.  Mild RV strain noted Vascular surgery consulted Patient hemodynamically stable, on room air, not tachycardic Plan: Continue IV heparin Multimodal pain regimen Telemetry monitoring, oxygen as necessary Vascular surgery consult Plan for pulmonary thrombectomy 5/23 Possible discharge 5/24   Hypertensive urgency Possibly in the setting of chest pain/acute PE Blood pressure control improved Plan: Continue prazosin, Norvasc, chlorthalidone As needed IV labetalol Continue telemetry monitoring    Dyslipidemia PTA statin   Depression PTA Effexor and Remeron   GERD without esophagitis P.o. PPI daily  DVT prophylaxis: Heparin  GTT Code Status: Full Family Communication: None today.  Offered to call but patient declined  Disposition Plan: Status is: Inpatient Remains inpatient appropriate because: Acute PE on IV heparin.  Submassive by criteria.  Plan for pulmonary thrombectomy as inpatient 5/23.   Level of care: Med-Surg  Consultants:  Vascular surgery  Procedures:  Pulmonary thrombectomy plan 5/23  Antimicrobials: None   Subjective: Seen and examined.  Sitting up on the edge of the bed.  No visible distress.  Not tachycardic.  Normal work of breathing.  Endorsing severe pain in left flank  Objective: Vitals:   10/14/21 1254 10/14/21 1304 10/14/21 1315 10/14/21 1330  BP: 117/71 127/69 130/76 113/69  Pulse: 60 (!) 56 (!) 58 67  Resp: '10 12 10 '$ (!) 9  Temp:      TempSrc:      SpO2: 97% 97% 95% 96%  Weight:      Height:        Intake/Output Summary (Last 24 hours) at 10/14/2021 1411 Last data filed at 10/14/2021 1249 Gross per 24 hour  Intake 155.58 ml  Output 2130 ml  Net -1974.42 ml   Filed Weights   10/12/21 1434 10/13/21 0000 10/14/21 1146  Weight: 86.2 kg 86.2 kg 86.2 kg    Examination:  General exam: NAD Respiratory system: Lungs clear.  Normal work of breathing.  Room air Cardiovascular system: S1-S2, RRR, no murmurs, no pedal edema Gastrointestinal system: Soft, NT/ND, normal bowel sounds Central nervous system: Alert and oriented. No focal neurological deficits. Extremities: Symmetric 5 x 5 power. Skin: No rashes, lesions or ulcers Psychiatry: Judgement and insight appear normal. Mood & affect appropriate.     Data Reviewed: I have personally reviewed following labs  and imaging studies  CBC: Recent Labs  Lab 10/12/21 1435 10/13/21 0146 10/14/21 0650  WBC 14.0* 15.1* 10.9*  HGB 14.2 12.9* 14.7  HCT 42.8 39.4 44.5  MCV 91.3 91.2 91.0  PLT 295 256 951   Basic Metabolic Panel: Recent Labs  Lab 10/12/21 1435 10/13/21 0146  NA 139 140  K 4.2 3.9  CL 104 107   CO2 28 27  GLUCOSE 102* 102*  BUN 16 15  CREATININE 1.22 1.01  CALCIUM 9.5 9.0   GFR: Estimated Creatinine Clearance: 86.5 mL/min (by C-G formula based on SCr of 1.01 mg/dL). Liver Function Tests: No results for input(s): AST, ALT, ALKPHOS, BILITOT, PROT, ALBUMIN in the last 168 hours. No results for input(s): LIPASE, AMYLASE in the last 168 hours. No results for input(s): AMMONIA in the last 168 hours. Coagulation Profile: Recent Labs  Lab 10/12/21 2045  INR 0.9   Cardiac Enzymes: No results for input(s): CKTOTAL, CKMB, CKMBINDEX, TROPONINI in the last 168 hours. BNP (last 3 results) No results for input(s): PROBNP in the last 8760 hours. HbA1C: No results for input(s): HGBA1C in the last 72 hours. CBG: Recent Labs  Lab 10/12/21 2358  GLUCAP 98   Lipid Profile: No results for input(s): CHOL, HDL, LDLCALC, TRIG, CHOLHDL, LDLDIRECT in the last 72 hours. Thyroid Function Tests: No results for input(s): TSH, T4TOTAL, FREET4, T3FREE, THYROIDAB in the last 72 hours. Anemia Panel: No results for input(s): VITAMINB12, FOLATE, FERRITIN, TIBC, IRON, RETICCTPCT in the last 72 hours. Sepsis Labs: No results for input(s): PROCALCITON, LATICACIDVEN in the last 168 hours.  Recent Results (from the past 240 hour(s))  MRSA Next Gen by PCR, Nasal     Status: Abnormal   Collection Time: 10/13/21 12:05 AM   Specimen: Nasal Mucosa; Nasal Swab  Result Value Ref Range Status   MRSA by PCR Next Gen DETECTED (A) NOT DETECTED Final    Comment: CRITICAL RESULT CALLED TO, READ BACK BY AND VERIFIED WITH: Frederic Jericho RN @ [phone] 10/13/21 LFD (NOTE) The GeneXpert MRSA Assay (FDA approved for NASAL specimens only), is one component of a comprehensive MRSA colonization surveillance program. It is not intended to diagnose MRSA infection nor to guide or monitor treatment for MRSA infections. Test performance is not FDA approved in patients less than 80 years old. Performed at [facility], [address], [place], [place] [address]          Radiology Studies: DG Chest 2 View  Result Date: 10/12/2021 CLINICAL DATA:  Shortness of breath, left rib and flank pain EXAM: CHEST - 2 VIEW COMPARISON:  None Available. 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 Normal heart size and vascularity. Negative for pneumonia, edema, effusion or pneumothorax. Trachea midline. Aorta atherosclerotic. No acute osseous finding. Lower thoracic fusion hardware partially imaged. IMPRESSION: Hyperinflation.  No superimposed acute process by plain radiography. Electronically Signed   By: Jerilynn Mages.  Shick M.D.   On: 10/12/2021 15:52   CT Angio Chest PE W and/or Wo Contrast  Result Date: 10/12/2021 CLINICAL DATA:  Pulmonary embolism (PE) suspected, positive D-dimer; Flank pain, kidney stone suspected EXAM: CT ANGIOGRAPHY CHEST CT ABDOMEN AND PELVIS WITH CONTRAST TECHNIQUE: Multidetector CT imaging of the chest was performed using the standard protocol during bolus administration of intravenous contrast. Multiplanar CT image reconstructions and MIPs were obtained to evaluate the vascular anatomy. Multidetector CT imaging of the abdomen and pelvis was performed using the standard protocol during bolus administration of intravenous contrast. RADIATION DOSE REDUCTION:  This exam was performed according to the departmental dose-optimization program which includes automated exposure control, adjustment of the mA and/or kV according to patient size and/or use of iterative reconstruction technique. CONTRAST:  114m OMNIPAQUE IOHEXOL 350 MG/ML SOLN COMPARISON:  CT abdomen pelvis 10/08/2020, CT lumbar spine 07/16/2021 FINDINGS: CTA CHEST FINDINGS Cardiovascular: Adequate opacification of the pulmonary arteries. Filling defects involving segmental branch pulmonary arteries of the left lower lobe (series 5, image 74; series 2, image 83). Additional PE involving a segmental left upper lobe  branch (series 2, image 60). No lobar or central pulmonary embolism. Elevated RV to LV ratio of 1.2. Heart size is normal. No pericardial effusion. Thoracic aorta is nonaneurysmal. Scattered atherosclerotic calcifications of the aorta and coronary arteries. Mediastinum/Nodes: No enlarged mediastinal, hilar, or axillary lymph nodes. Thyroid gland, trachea, and esophagus demonstrate no significant findings. Lungs/Pleura: Mild dependent subsegmental bibasilar atelectasis. Lungs are otherwise clear. No pleural effusion or pneumothorax. Musculoskeletal: No chest wall abnormality. No acute or significant osseous findings. Review of the MIP images confirms the above findings. CT ABDOMEN and PELVIS FINDINGS Hepatobiliary: Prior cholecystectomy. Persistent pneumobilia within the liver. Chronically dilated common bile duct, also with pneumobilia. No focal liver lesion identified. Pancreas: Unremarkable. No pancreatic ductal dilatation or surrounding inflammatory changes. Spleen: Normal in size without focal abnormality. Adrenals/Urinary Tract: Adrenal glands are unremarkable. Kidneys are normal, without renal calculi, solid lesion, or hydronephrosis. Bladder is unremarkable. Stomach/Bowel: Stomach is within normal limits. No evidence of bowel wall thickening, distention, or inflammatory changes. Vascular/Lymphatic: Aortic atherosclerosis. No enlarged abdominal or pelvic lymph nodes. Reproductive: Prostate is unremarkable. Other: No free fluid. No abdominopelvic fluid collection. No pneumoperitoneum. No abdominal wall hernia. Musculoskeletal: Extensive thoraco lumbar to bi-iliac spinal fusion. Similar appearance of L4 vertebral body compression fracture. Unchanged anterolisthesis of L3 on L4 and L2 on L3. Similar degree of perihardware lucency adjacent to the screws at T10. No new or acute bony findings. Review of the MIP images confirms the above findings. IMPRESSION: 1. Acute pulmonary emboli involving segmental branch  pulmonary arteries of the left lower lobe and left upper lobe. Elevated RV to LV ratio of 1.2. 2. Lungs are clear. 3. No acute abdominopelvic process. 4. Additional chronic and/or incidental findings, as above. 5. Aortic atherosclerosis (ICD10-I70.0). These results were called by telephone at the time of interpretation on 10/12/2021 at 8:00 pm to provider PHILLIP STAFFORD , who verbally acknowledged these results. Electronically Signed   By: NDavina PokeD.O.   On: 10/12/2021 20:06   CT ABDOMEN PELVIS W CONTRAST  Result Date: 10/12/2021 CLINICAL DATA:  Pulmonary embolism (PE) suspected, positive D-dimer; Flank pain, kidney stone suspected EXAM: CT ANGIOGRAPHY CHEST CT ABDOMEN AND PELVIS WITH CONTRAST TECHNIQUE: Multidetector CT imaging of the chest was performed using the standard protocol during bolus administration of intravenous contrast. Multiplanar CT image reconstructions and MIPs were obtained to evaluate the vascular anatomy. Multidetector CT imaging of the abdomen and pelvis was performed using the standard protocol during bolus administration of intravenous contrast. RADIATION DOSE REDUCTION: This exam was performed according to the departmental dose-optimization program which includes automated exposure control, adjustment of the mA and/or kV according to patient size and/or use of iterative reconstruction technique. CONTRAST:  1010mOMNIPAQUE IOHEXOL 350 MG/ML SOLN COMPARISON:  CT abdomen pelvis 10/08/2020, CT lumbar spine 07/16/2021 FINDINGS: CTA CHEST FINDINGS Cardiovascular: Adequate opacification of the pulmonary arteries. Filling defects involving segmental branch pulmonary arteries of the left lower lobe (series 5, image 74; series 2, image 83). Additional PE involving a  segmental left upper lobe branch (series 2, image 60). No lobar or central pulmonary embolism. Elevated RV to LV ratio of 1.2. Heart size is normal. No pericardial effusion. Thoracic aorta is nonaneurysmal. Scattered  atherosclerotic calcifications of the aorta and coronary arteries. Mediastinum/Nodes: No enlarged mediastinal, hilar, or axillary lymph nodes. Thyroid gland, trachea, and esophagus demonstrate no significant findings. Lungs/Pleura: Mild dependent subsegmental bibasilar atelectasis. Lungs are otherwise clear. No pleural effusion or pneumothorax. Musculoskeletal: No chest wall abnormality. No acute or significant osseous findings. Review of the MIP images confirms the above findings. CT ABDOMEN and PELVIS FINDINGS Hepatobiliary: Prior cholecystectomy. Persistent pneumobilia within the liver. Chronically dilated common bile duct, also with pneumobilia. No focal liver lesion identified. Pancreas: Unremarkable. No pancreatic ductal dilatation or surrounding inflammatory changes. Spleen: Normal in size without focal abnormality. Adrenals/Urinary Tract: Adrenal glands are unremarkable. Kidneys are normal, without renal calculi, solid lesion, or hydronephrosis. Bladder is unremarkable. Stomach/Bowel: Stomach is within normal limits. No evidence of bowel wall thickening, distention, or inflammatory changes. Vascular/Lymphatic: Aortic atherosclerosis. No enlarged abdominal or pelvic lymph nodes. Reproductive: Prostate is unremarkable. Other: No free fluid. No abdominopelvic fluid collection. No pneumoperitoneum. No abdominal wall hernia. Musculoskeletal: Extensive thoraco lumbar to bi-iliac spinal fusion. Similar appearance of L4 vertebral body compression fracture. Unchanged anterolisthesis of L3 on L4 and L2 on L3. Similar degree of perihardware lucency adjacent to the screws at T10. No new or acute bony findings. Review of the MIP images confirms the above findings. IMPRESSION: 1. Acute pulmonary emboli involving segmental branch pulmonary arteries of the left lower lobe and left upper lobe. Elevated RV to LV ratio of 1.2. 2. Lungs are clear. 3. No acute abdominopelvic process. 4. Additional chronic and/or incidental  findings, as above. 5. Aortic atherosclerosis (ICD10-I70.0). These results were called by telephone at the time of interpretation on 10/12/2021 at 8:00 pm to provider PHILLIP STAFFORD , who verbally acknowledged these results. Electronically Signed   By: Davina Poke D.O.   On: 10/12/2021 20:06   PERIPHERAL VASCULAR CATHETERIZATION  Result Date: 10/14/2021 See surgical note for result.  US Venous Img Lower Bilateral (DVT)  Result Date: 10/14/2021 CLINICAL DATA:  Lower extremity pain.  POSITIVE PE. EXAM: BILATERAL LOWER EXTREMITY VENOUS DOPPLER ULTRASOUND TECHNIQUE: Gray-scale sonography with graded compression, as well as color Doppler and duplex ultrasound were performed to evaluate the lower extremity deep venous systems from the level of the common femoral vein and including the common femoral, femoral, profunda femoral, popliteal and calf veins including the posterior tibial, peroneal and gastrocnemius veins when visible. The superficial great saphenous vein was also interrogated. Spectral Doppler was utilized to evaluate flow at rest and with distal augmentation maneuvers in the common femoral, femoral and popliteal veins. COMPARISON:  CTA PE and CT AP 10/13/2010. FINDINGS: RIGHT LOWER EXTREMITY VENOUS Normal compressibility of the RIGHT common femoral, superficial femoral, and popliteal veins, as well as the visualized calf veins. Visualized portions of profunda femoral vein and great saphenous vein unremarkable. No filling defects to suggest DVT on grayscale or color Doppler imaging. Doppler waveforms show normal direction of venous flow, normal respiratory plasticity and response to augmentation. OTHER No evidence of superficial thrombophlebitis or abnormal fluid collection. Limitations: none LEFT LOWER EXTREMITY VENOUS Normal compressibility of the LEFT common femoral, superficial femoral, and popliteal veins, as well as the visualized calf veins. Visualized portions of profunda femoral vein and  great saphenous vein unremarkable. No filling defects to suggest DVT on grayscale or color Doppler imaging. Doppler waveforms show normal  direction of venous flow, normal respiratory plasticity and response to augmentation. OTHER No evidence of superficial thrombophlebitis or abnormal fluid collection. Limitations: none IMPRESSION: No evidence of femoropopliteal DVT within either lower extremity. Michaelle Birks, MD [facility] Electronically Signed   By: Michaelle Birks M.D.   On: 10/14/2021 07:50        Scheduled Meds:  lidocaine  1 patch Transdermal Q24H   Continuous Infusions:  heparin       LOS: 2 days      Sidney Ace, MD [facility]   If 7PM-7AM, please contact night-coverage  10/14/2021, 2:11 PM

## 2021-10-14 NOTE — Consult Note (Signed)
ANTICOAGULATION CONSULT NOTE   Pharmacy Consult for IV Heparin Indication: pulmonary embolus  Patient Measurements: Height: '6\' 5"'$  (195.6 cm) Weight: 86.2 kg (190 lb) IBW/kg (Calculated) : 89.1 Heparin Dosing Weight: 86.2 kg  Labs: Recent Labs    10/12/21 1435 10/12/21 1635 10/12/21 2045 10/12/21 2045 10/13/21 0146 10/13/21 1131 10/13/21 2147 10/14/21 0650 10/14/21 2131  HGB 14.2  --   --   --  12.9*  --   --  14.7  --   HCT 42.8  --   --   --  39.4  --   --  44.5  --   PLT 295  --   --   --  256  --   --  289  --   APTT  --   --  24  --   --   --   --   --   --   LABPROT  --   --  12.5  --   --   --   --   --   --   INR  --   --  0.9  --   --   --   --   --   --   HEPARINUNFRC  --   --  <0.10*   < > >1.10*   < > 0.74* 0.73* 0.41  CREATININE 1.22  --   --   --  1.01  --   --   --   --   TROPONINIHS 6 7  --   --   --   --   --   --   --    < > = values in this interval not displayed.   Heparin Dosing Weight: 86.2 kg  Estimated Creatinine Clearance: 86.5 mL/min (by C-G formula based on SCr of 1.01 mg/dL).  Medical History: Past Medical History:  Diagnosis Date   Hypertension    Neck injury    Medications:  *Noted Eliquis as PTA medication. Patient reports taking last dose ~ 1 week ago*  Baseline HL < 0.10, aPTT 24s, INR 0.9  Assessment: Noted history of DVT, cocaine use, opioid dependence on daily OxyContin, and CKD. Primary care with [facility]. Pharmacy consulted to manage heparin infusion for new PE.  Date Time  HL Rate/Comment 5/22 2147  0.74 supratherapeutic, decrease rate from 1000 > 900 unit/hr 5/23  0650  0.73    supratherapeutic, decrease rate from 900 > 800 unit/hr 5/23 2131  0.44 Therapeutic x1; 800 un/hr  Goal of Therapy:  Heparin level 0.3-0.7 units/ml Monitor platelets by anticoagulation protocol: Yes   Plan:  Therapeutic x1 0.44 (goal 0.3-0.7), Continue current rate of 800 units/hr Recheck HL in 6 hrs to confirm therapeutic, then daily  monitoring once consecutively therapeutic CTM daily CBC while on heparin gtt   Lorna Dibble, PharmD, [facility] Clinical Pharmacist 10/14/2021 9:58 PM

## 2021-10-14 NOTE — Plan of Care (Signed)
?  Problem: Health Behavior/Discharge Planning: ?Goal: Ability to manage health-related needs will improve ?Outcome: Progressing ?  ?Problem: Clinical Measurements: ?Goal: Will remain free from infection ?Outcome: Progressing ?  ?Problem: Clinical Measurements: ?Goal: Respiratory complications will improve ?Outcome: Progressing ?  ?Problem: Clinical Measurements: ?Goal: Cardiovascular complication will be avoided ?Outcome: Progressing ?  ?Problem: Pain Managment: ?Goal: General experience of comfort will improve ?Outcome: Progressing ?  ?Problem: Safety: ?Goal: Ability to remain free from injury will improve ?Outcome: Progressing ?  ?

## 2021-10-14 NOTE — Interval H&P Note (Signed)
History and Physical Interval Note:  10/14/2021 11:35 AM  Earl Murray  has presented today for surgery, with the diagnosis of Pulmonary embolism.  The various methods of treatment have been discussed with the patient and family. After consideration of risks, benefits and other options for treatment, the patient has consented to  Procedure(s): PULMONARY THROMBECTOMY (N/A) as a surgical intervention.  The patient's history has been reviewed, patient examined, no change in status, stable for surgery.  I have reviewed the patient's chart and labs.  Questions were answered to the patient's satisfaction.     Hortencia Pilar

## 2021-10-15 ENCOUNTER — Encounter: Payer: Self-pay | Admitting: Vascular Surgery

## 2021-10-15 DIAGNOSIS — I2699 Other pulmonary embolism without acute cor pulmonale: Secondary | ICD-10-CM

## 2021-10-15 DIAGNOSIS — M79606 Pain in leg, unspecified: Secondary | ICD-10-CM

## 2021-10-15 LAB — CBC
HCT: 40.7 % (ref 39.0–52.0)
Hemoglobin: 13.1 g/dL (ref 13.0–17.0)
MCH: 30.1 pg (ref 26.0–34.0)
MCHC: 32.2 g/dL (ref 30.0–36.0)
MCV: 93.6 fL (ref 80.0–100.0)
Platelets: 250 10*3/uL (ref 150–400)
RBC: 4.35 MIL/uL (ref 4.22–5.81)
RDW: 17.3 % — ABNORMAL HIGH (ref 11.5–15.5)
WBC: 8.6 10*3/uL (ref 4.0–10.5)
nRBC: 0 % (ref 0.0–0.2)

## 2021-10-15 LAB — HEPARIN LEVEL (UNFRACTIONATED): Heparin Unfractionated: 0.29 IU/mL — ABNORMAL LOW (ref 0.30–0.70)

## 2021-10-15 MED ORDER — APIXABAN 5 MG PO TABS
5.0000 mg | ORAL_TABLET | Freq: Two times a day (BID) | ORAL | Status: DC
Start: 2021-10-22 — End: 2021-10-15

## 2021-10-15 MED ORDER — APIXABAN (ELIQUIS) VTE STARTER PACK (10MG AND 5MG): ORAL_TABLET | ORAL | 0 refills | Status: DC

## 2021-10-15 MED ORDER — APIXABAN 5 MG PO TABS
10.0000 mg | ORAL_TABLET | Freq: Two times a day (BID) | ORAL | Status: DC
  Administered 2021-10-15: 10 mg via ORAL
  Filled 2021-10-15 (×2): qty 2

## 2021-10-15 MED ORDER — ACETAMINOPHEN 325 MG PO TABS: 650.0000 mg | ORAL_TABLET | Freq: Four times a day (QID) | ORAL | Status: DC | PRN

## 2021-10-15 MED ORDER — OXYCODONE HCL 5 MG PO TABS
10.0000 mg | ORAL_TABLET | Freq: Four times a day (QID) | ORAL | Status: DC | PRN
  Administered 2021-10-15: 10 mg via ORAL
  Filled 2021-10-15: qty 2

## 2021-10-15 MED ORDER — HEPARIN BOLUS VIA INFUSION
1200.0000 [IU] | Freq: Once | INTRAVENOUS | Status: AC
  Administered 2021-10-15: 1200 [IU] via INTRAVENOUS
  Filled 2021-10-15: qty 1200

## 2021-10-15 NOTE — Progress Notes (Signed)
Discharge instructions provided. All medications, follow up appointments, and discharge instructions discussed. IV out. Monitor off CCMD notified. Discharging to home.  Era Bumpers, RN

## 2021-10-15 NOTE — Consult Note (Signed)
ANTICOAGULATION CONSULT NOTE- initial consult  Pharmacy Consult for apixaban Indication: pulmonary embolus  Patient Measurements: Height: '6\' 5"'$  (195.6 cm) Weight: 86.2 kg (190 lb) IBW/kg (Calculated) : 89.1 Heparin Dosing Weight: 86.2 kg  Labs: Recent Labs    10/12/21 1435 10/12/21 1435 10/12/21 1635 10/12/21 2045 10/13/21 0146 10/13/21 1131 10/14/21 0650 10/14/21 2131 10/15/21 0724  HGB 14.2  --   --   --  12.9*  --  14.7  --  13.1  HCT 42.8  --   --   --  39.4  --  44.5  --  40.7  PLT 295  --   --   --  256  --  289  --  250  APTT  --   --   --  24  --   --   --   --   --   LABPROT  --   --   --  12.5  --   --   --   --   --   INR  --   --   --  0.9  --   --   --   --   --   HEPARINUNFRC  --    < >  --  <0.10* >1.10*   < > 0.73* 0.41 0.29*  CREATININE 1.22  --   --   --  1.01  --   --   --   --   TROPONINIHS 6  --  7  --   --   --   --   --   --    < > = values in this interval not displayed.   Heparin Dosing Weight: 86.2 kg  Estimated Creatinine Clearance: 86.5 mL/min (by C-G formula based on SCr of 1.01 mg/dL).  Medical History: Past Medical History:  Diagnosis Date   Hypertension    Neck injury    Medications:  *Noted Eliquis as PTA medication. Patient reports taking last dose ~ 1 week ago*  Baseline HL < 0.10, aPTT 24s, INR 0.9  Assessment: Noted history of DVT, cocaine use, opioid dependence on daily OxyContin, and CKD. Primary care with [facility]. Pharmacy consulted to manage heparin infusion for new PE.  S/p Mechanical thrombectomy 5/23  Goal of Therapy:  Monitor platelets by anticoagulation protocol: Yes   Plan:  Will order apixaban 10 mg BID x 7 days followed by 5 mg bid afterwards. F/u CBC per protocol  Chinita Greenland PharmD Clinical Pharmacist 10/15/2021

## 2021-10-15 NOTE — Consult Note (Signed)
ANTICOAGULATION CONSULT NOTE   Pharmacy Consult for IV Heparin Indication: pulmonary embolus  Patient Measurements: Height: '6\' 5"'$  (195.6 cm) Weight: 86.2 kg (190 lb) IBW/kg (Calculated) : 89.1 Heparin Dosing Weight: 86.2 kg  Labs: Recent Labs    10/12/21 1435 10/12/21 1635 10/12/21 2045 10/12/21 2045 10/13/21 0146 10/13/21 1131 10/14/21 0650 10/14/21 2131 10/15/21 0724  HGB 14.2  --   --   --  12.9*  --  14.7  --   --   HCT 42.8  --   --   --  39.4  --  44.5  --   --   PLT 295  --   --   --  256  --  289  --   --   APTT  --   --  24  --   --   --   --   --   --   LABPROT  --   --  12.5  --   --   --   --   --   --   INR  --   --  0.9  --   --   --   --   --   --   HEPARINUNFRC  --   --  <0.10*   < > >1.10*   < > 0.73* 0.41 0.29*  CREATININE 1.22  --   --   --  1.01  --   --   --   --   TROPONINIHS 6 7  --   --   --   --   --   --   --    < > = values in this interval not displayed.   Heparin Dosing Weight: 86.2 kg  Estimated Creatinine Clearance: 86.5 mL/min (by C-G formula based on SCr of 1.01 mg/dL).  Medical History: Past Medical History:  Diagnosis Date   Hypertension    Neck injury    Medications:  *Noted Eliquis as PTA medication. Patient reports taking last dose ~ 1 week ago*  Baseline HL < 0.10, aPTT 24s, INR 0.9  Assessment: Noted history of DVT, cocaine use, opioid dependence on daily OxyContin, and CKD. Primary care with [facility]. Pharmacy consulted to manage heparin infusion for new PE.  Date Time  HL Rate/Comment 5/22 2147  0.74 supratherapeutic, decrease rate from 1000 > 900 unit/hr 5/23  0650  0.73    supratherapeutic, decrease rate from 900 > 800 unit/hr 5/23 2131  0.44 Therapeutic x1; 800 un/hr 5/24 0724 0.29 Subthera, increase rate to 850  Goal of Therapy:  Heparin level 0.3-0.7 units/ml Monitor platelets by anticoagulation protocol: Yes   Plan:  5/24  0724 HL=0.29 Subtherapeutic S/p thrombectomy 5/23 Give bolus of 1200 units and  Increase rate to 850 units/hr Recheck HL in 6 hrs to confirm. then daily monitoring once consecutively therapeutic CTM daily CBC while on heparin gtt   Chinita Greenland PharmD Clinical Pharmacist 10/15/2021

## 2021-10-15 NOTE — Care Management Important Message (Signed)
Important Message  Patient Details  Name: Earl Murray MRN: [id number] Date of Birth: 11-06-1955   Medicare Important Message Given:  Yes     Dannette Barbara 10/15/2021, 11:15 AM

## 2021-10-15 NOTE — Progress Notes (Signed)
Error. Disregard

## 2021-10-15 NOTE — TOC Transition Note (Signed)
Transition of Care [facility]) - CM/SW Discharge Note   Patient Details  Name: Earl Murray MRN: [id number] Date of Birth: [birth year]  Transition of Care [facility]) CM/SW Contact:  Candie Chroman, LCSW Phone Number: 10/15/2021, 3:02 PM   Clinical Narrative:  Patient has orders to discharge home today. No further concerns. CSW signing off.   Final next level of care: Home/Self Care Barriers to Discharge: Barriers Resolved   Patient Goals and CMS Choice        Discharge Placement                    Patient and family notified of of transfer: 10/15/21  Discharge Plan and Services     Post Acute Care Choice: NA                               Social Determinants of Health (SDOH) Interventions     Readmission Risk Interventions    10/13/2021   12:23 PM  Readmission Risk Prevention Plan  Transportation Screening Complete  PCP or Specialist Appt within 3-5 Days Complete  Social Work Consult for [place] Planning/Counseling Complete  Palliative Care Screening Not Applicable  Medication Review [employer]) Complete

## 2021-10-15 NOTE — Discharge Summary (Signed)
Physician Discharge Summary  Patient: Earl Murray ENI:[id number] DOB: 1955/07/22   Code Status: Full Code Admit date: 10/12/2021 Discharge date: 10/15/2021 Disposition: Home, No home health services recommended [facility]CP: [facility] [place]  Recommendations for Outpatient Follow-up:  Follow up with PCP within 1-2 weeks Follow up with vascular surgery  Discharge Diagnoses:  Principal Problem:   Acute pulmonary embolism ([place]) Active Problems:   Dyslipidemia   Hypertensive urgency   GERD without esophagitis   Depression   Pain of lower extremity   Pulmonary embolism [facility])  Brief Hospital Course Summary: Selig Wampole is a 66 y.o. male with a PMH significant for essential hypertension and history of DVT after back surgery in September of last year s/p anticoagulation for 3 months.  They presented from home to the ED on 10/12/2021 with left-sided upper and mid back pain worsening with deep breathing x1 day.  In the ED, it was found that they had stable vital signs ORA.  Significant findings included positive subsegmental PE left on chest CTA. No evidence of DVT on LE dopplers.  They were initially treated with heparin infusion and toradol, morphine, oxycodone. Started on IVF. Vascular surgery was consulted.   Patient underwent elective thrombectomy 5/23 and tolerated well. He was transitioned from heparin gtt to PO anticoagulation.   On day of discharge, he was able to ambulate without significant DOE, chest pain and his O2 saturations remained normal.   All other chronic conditions were treated with home medications.   Discharge Condition: Good, improved Recommended discharge diet: Regular healthy diet  Consultations: Vascular surgery  Procedures/Studies: Thrombectomy 5/23   Allergies as of 10/15/2021       Reactions   Duloxetine Other (See Comments)   Lisinopril Cough   Tylenol With Codeine #3 [acetaminophen-codeine] Itching   Motrin [ibuprofen] Rash   Tramadol Rash         Medication List     STOP taking these medications    apixaban 5 MG Tabs tablet Commonly known as: Eliquis Replaced by: Apixaban Starter Pack ('10mg'$  and '5mg'$ )   atorvastatin 20 MG tablet Commonly known as: LIPITOR   baclofen 10 MG tablet Commonly known as: LIORESAL   chlorthalidone 25 MG tablet Commonly known as: HYGROTON   fexofenadine 60 MG tablet Commonly known as: Allegra Allergy   fluticasone 50 MCG/ACT nasal spray Commonly known as: FLONASE   HYDROcodone-acetaminophen 5-325 MG tablet Commonly known as: NORCO/VICODIN   mirtazapine 7.5 MG tablet Commonly known as: REMERON   omeprazole 20 MG tablet Commonly known as: PriLOSEC OTC   ondansetron 4 MG disintegrating tablet Commonly known as: ZOFRAN-ODT   Oxycodone HCl 10 MG Tabs   pantoprazole 40 MG tablet Commonly known as: PROTONIX   sucralfate 1 g tablet Commonly known as: Carafate   venlafaxine XR 37.5 MG 24 hr capsule Commonly known as: EFFEXOR-XR       TAKE these medications    acetaminophen 325 MG tablet Commonly known as: TYLENOL Take 2 tablets (650 mg total) by mouth every 6 (six) hours as needed for mild pain (or Fever >/= 101).   amLODipine 10 MG tablet Commonly known as: NORVASC Take 10 mg by mouth daily.   Apixaban Starter Pack ('10mg'$  and '5mg'$ ) Commonly known as: ELIQUIS STARTER PACK Take as directed on package: start with two-'5mg'$  tablets twice daily for 7 days. On day 8, switch to one-'5mg'$  tablet twice daily. Replaces: apixaban 5 MG Tabs tablet   dicyclomine 10 MG capsule Commonly known as: Bentyl Take 1  capsule (10 mg total) by mouth 3 (three) times daily as needed for up to 14 days for spasms. or abdominal pain   hydrochlorothiazide 25 MG tablet Commonly known as: HYDRODIURIL Take 1 tablet (25 mg total) by mouth daily.   oxyCODONE-acetaminophen 5-325 MG tablet Commonly known as: PERCOCET/ROXICET Take 1 tablet by mouth in the morning, at noon, and at bedtime.          Subjective   Pt reports feeling much improved. Denies cough. No Dyspnea. Feels well enough to go home.  Objective  Blood pressure (!) 154/71, pulse 87, temperature 98.6 F (37 C), temperature source Oral, resp. rate 18, height '6\' 5"'$  (1.956 m), weight 86.2 kg, SpO2 97 %.   General: Pt is alert, awake, not in acute distress Cardiovascular: RRR, S1/S2 +, no rubs, no gallops Respiratory: CTA bilaterally, no wheezing, no rhonchi Abdominal: Soft, NT, ND, bowel sounds + Extremities: no edema, no cyanosis   The results of significant diagnostics from this hospitalization (including imaging, microbiology, ancillary and laboratory) are listed below for reference.   Imaging studies: DG Chest 2 View  Result Date: 10/12/2021 CLINICAL DATA:  Shortness of breath, left rib and flank pain EXAM: CHEST - 2 VIEW COMPARISON:  None Available. 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 Normal heart size and vascularity. Negative for pneumonia, edema, effusion or pneumothorax. Trachea midline. Aorta atherosclerotic. No acute osseous finding. Lower thoracic fusion hardware partially imaged. IMPRESSION: Hyperinflation.  No superimposed acute process by plain radiography. Electronically Signed   By: Jerilynn Mages.  Shick M.D.   On: 10/12/2021 15:52   CT Angio Chest PE W and/or Wo Contrast  Result Date: 10/12/2021 CLINICAL DATA:  Pulmonary embolism (PE) suspected, positive D-dimer; Flank pain, kidney stone suspected EXAM: CT ANGIOGRAPHY CHEST CT ABDOMEN AND PELVIS WITH CONTRAST TECHNIQUE: Multidetector CT imaging of the chest was performed using the standard protocol during bolus administration of intravenous contrast. Multiplanar CT image reconstructions and MIPs were obtained to evaluate the vascular anatomy. Multidetector CT imaging of the abdomen and pelvis was performed using the standard protocol during bolus administration of intravenous contrast. RADIATION DOSE REDUCTION: This  exam was performed according to the departmental dose-optimization program which includes automated exposure control, adjustment of the mA and/or kV according to patient size and/or use of iterative reconstruction technique. CONTRAST:  159m OMNIPAQUE IOHEXOL 350 MG/ML SOLN COMPARISON:  CT abdomen pelvis 10/08/2020, CT lumbar spine 07/16/2021 FINDINGS: CTA CHEST FINDINGS Cardiovascular: Adequate opacification of the pulmonary arteries. Filling defects involving segmental branch pulmonary arteries of the left lower lobe (series 5, image 74; series 2, image 83). Additional PE involving a segmental left upper lobe branch (series 2, image 60). No lobar or central pulmonary embolism. Elevated RV to LV ratio of 1.2. Heart size is normal. No pericardial effusion. Thoracic aorta is nonaneurysmal. Scattered atherosclerotic calcifications of the aorta and coronary arteries. Mediastinum/Nodes: No enlarged mediastinal, hilar, or axillary lymph nodes. Thyroid gland, trachea, and esophagus demonstrate no significant findings. Lungs/Pleura: Mild dependent subsegmental bibasilar atelectasis. Lungs are otherwise clear. No pleural effusion or pneumothorax. Musculoskeletal: No chest wall abnormality. No acute or significant osseous findings. Review of the MIP images confirms the above findings. CT ABDOMEN and PELVIS FINDINGS Hepatobiliary: Prior cholecystectomy. Persistent pneumobilia within the liver. Chronically dilated common bile duct, also with pneumobilia. No focal liver lesion identified. Pancreas: Unremarkable. No pancreatic ductal dilatation or surrounding inflammatory changes. Spleen: Normal in size without focal abnormality. Adrenals/Urinary Tract: Adrenal glands are unremarkable. Kidneys are normal, without renal calculi,  solid lesion, or hydronephrosis. Bladder is unremarkable. Stomach/Bowel: Stomach is within normal limits. No evidence of bowel wall thickening, distention, or inflammatory changes. Vascular/Lymphatic:  Aortic atherosclerosis. No enlarged abdominal or pelvic lymph nodes. Reproductive: Prostate is unremarkable. Other: No free fluid. No abdominopelvic fluid collection. No pneumoperitoneum. No abdominal wall hernia. Musculoskeletal: Extensive thoraco lumbar to bi-iliac spinal fusion. Similar appearance of L4 vertebral body compression fracture. Unchanged anterolisthesis of L3 on L4 and L2 on L3. Similar degree of perihardware lucency adjacent to the screws at T10. No new or acute bony findings. Review of the MIP images confirms the above findings. IMPRESSION: 1. Acute pulmonary emboli involving segmental branch pulmonary arteries of the left lower lobe and left upper lobe. Elevated RV to LV ratio of 1.2. 2. Lungs are clear. 3. No acute abdominopelvic process. 4. Additional chronic and/or incidental findings, as above. 5. Aortic atherosclerosis (ICD10-I70.0). These results were called by telephone at the time of interpretation on 10/12/2021 at 8:00 pm to provider PHILLIP STAFFORD , who verbally acknowledged these results. Electronically Signed   By: Davina Poke D.O.   On: 10/12/2021 20:06   CT ABDOMEN PELVIS W CONTRAST  Result Date: 10/12/2021 CLINICAL DATA:  Pulmonary embolism (PE) suspected, positive D-dimer; Flank pain, kidney stone suspected EXAM: CT ANGIOGRAPHY CHEST CT ABDOMEN AND PELVIS WITH CONTRAST TECHNIQUE: Multidetector CT imaging of the chest was performed using the standard protocol during bolus administration of intravenous contrast. Multiplanar CT image reconstructions and MIPs were obtained to evaluate the vascular anatomy. Multidetector CT imaging of the abdomen and pelvis was performed using the standard protocol during bolus administration of intravenous contrast. RADIATION DOSE REDUCTION: This exam was performed according to the departmental dose-optimization program which includes automated exposure control, adjustment of the mA and/or kV according to patient size and/or use of iterative  reconstruction technique. CONTRAST:  158m OMNIPAQUE IOHEXOL 350 MG/ML SOLN COMPARISON:  CT abdomen pelvis 10/08/2020, CT lumbar spine 07/16/2021 FINDINGS: CTA CHEST FINDINGS Cardiovascular: Adequate opacification of the pulmonary arteries. Filling defects involving segmental branch pulmonary arteries of the left lower lobe (series 5, image 74; series 2, image 83). Additional PE involving a segmental left upper lobe branch (series 2, image 60). No lobar or central pulmonary embolism. Elevated RV to LV ratio of 1.2. Heart size is normal. No pericardial effusion. Thoracic aorta is nonaneurysmal. Scattered atherosclerotic calcifications of the aorta and coronary arteries. Mediastinum/Nodes: No enlarged mediastinal, hilar, or axillary lymph nodes. Thyroid gland, trachea, and esophagus demonstrate no significant findings. Lungs/Pleura: Mild dependent subsegmental bibasilar atelectasis. Lungs are otherwise clear. No pleural effusion or pneumothorax. Musculoskeletal: No chest wall abnormality. No acute or significant osseous findings. Review of the MIP images confirms the above findings. CT ABDOMEN and PELVIS FINDINGS Hepatobiliary: Prior cholecystectomy. Persistent pneumobilia within the liver. Chronically dilated common bile duct, also with pneumobilia. No focal liver lesion identified. Pancreas: Unremarkable. No pancreatic ductal dilatation or surrounding inflammatory changes. Spleen: Normal in size without focal abnormality. Adrenals/Urinary Tract: Adrenal glands are unremarkable. Kidneys are normal, without renal calculi, solid lesion, or hydronephrosis. Bladder is unremarkable. Stomach/Bowel: Stomach is within normal limits. No evidence of bowel wall thickening, distention, or inflammatory changes. Vascular/Lymphatic: Aortic atherosclerosis. No enlarged abdominal or pelvic lymph nodes. Reproductive: Prostate is unremarkable. Other: No free fluid. No abdominopelvic fluid collection. No pneumoperitoneum. No abdominal  wall hernia. Musculoskeletal: Extensive thoraco lumbar to bi-iliac spinal fusion. Similar appearance of L4 vertebral body compression fracture. Unchanged anterolisthesis of L3 on L4 and L2 on L3. Similar degree of perihardware lucency adjacent  to the screws at T10. No new or acute bony findings. Review of the MIP images confirms the above findings. IMPRESSION: 1. Acute pulmonary emboli involving segmental branch pulmonary arteries of the left lower lobe and left upper lobe. Elevated RV to LV ratio of 1.2. 2. Lungs are clear. 3. No acute abdominopelvic process. 4. Additional chronic and/or incidental findings, as above. 5. Aortic atherosclerosis (ICD10-I70.0). These results were called by telephone at the time of interpretation on 10/12/2021 at 8:00 pm to provider PHILLIP STAFFORD , who verbally acknowledged these results. Electronically Signed   By: Davina Poke D.O.   On: 10/12/2021 20:06   PERIPHERAL VASCULAR CATHETERIZATION  Result Date: 10/14/2021 See surgical note for result.  US Venous Img Lower Bilateral (DVT)  Result Date: 10/14/2021 CLINICAL DATA:  Lower extremity pain.  POSITIVE PE. EXAM: BILATERAL LOWER EXTREMITY VENOUS DOPPLER ULTRASOUND TECHNIQUE: Gray-scale sonography with graded compression, as well as color Doppler and duplex ultrasound were performed to evaluate the lower extremity deep venous systems from the level of the common femoral vein and including the common femoral, femoral, profunda femoral, popliteal and calf veins including the posterior tibial, peroneal and gastrocnemius veins when visible. The superficial great saphenous vein was also interrogated. Spectral Doppler was utilized to evaluate flow at rest and with distal augmentation maneuvers in the common femoral, femoral and popliteal veins. COMPARISON:  CTA PE and CT AP 10/13/2010. FINDINGS: RIGHT LOWER EXTREMITY VENOUS Normal compressibility of the RIGHT common femoral, superficial femoral, and popliteal veins, as well as  the visualized calf veins. Visualized portions of profunda femoral vein and great saphenous vein unremarkable. No filling defects to suggest DVT on grayscale or color Doppler imaging. Doppler waveforms show normal direction of venous flow, normal respiratory plasticity and response to augmentation. OTHER No evidence of superficial thrombophlebitis or abnormal fluid collection. Limitations: none LEFT LOWER EXTREMITY VENOUS Normal compressibility of the LEFT common femoral, superficial femoral, and popliteal veins, as well as the visualized calf veins. Visualized portions of profunda femoral vein and great saphenous vein unremarkable. No filling defects to suggest DVT on grayscale or color Doppler imaging. Doppler waveforms show normal direction of venous flow, normal respiratory plasticity and response to augmentation. OTHER No evidence of superficial thrombophlebitis or abnormal fluid collection. Limitations: none IMPRESSION: No evidence of femoropopliteal DVT within either lower extremity. Michaelle Birks, MD [facility] Electronically Signed   By: Michaelle Birks M.D.   On: 10/14/2021 07:50    Labs: Basic Metabolic Panel: Recent Labs  Lab 10/12/21 1435 10/13/21 0146  NA 139 140  K 4.2 3.9  CL 104 107  CO2 28 27  GLUCOSE 102* 102*  BUN 16 15  CREATININE 1.22 1.01  CALCIUM 9.5 9.0   CBC: Recent Labs  Lab 10/12/21 1435 10/13/21 0146 10/14/21 0650 10/15/21 0724  WBC 14.0* 15.1* 10.9* 8.6  HGB 14.2 12.9* 14.7 13.1  HCT 42.8 39.4 44.5 40.7  MCV 91.3 91.2 91.0 93.6  PLT 295 256 289 250   Microbiology: MRSA nasal swab positive  Time coordinating discharge: Over 30 minutes  Richarda Osmond, MD  [facility] 10/15/2021, 2:34 PM

## 2021-12-26 ENCOUNTER — Ambulatory Visit
Admission: EM | Admit: 2021-12-26 | Discharge: 2021-12-26 | Disposition: A | Discharge status: Home / Self Care | Payer: Medicare Other | Attending: Emergency Medicine | Admitting: Emergency Medicine

## 2021-12-26 ENCOUNTER — Ambulatory Visit (INDEPENDENT_AMBULATORY_CARE_PROVIDER_SITE_OTHER): Payer: Medicare Other

## 2021-12-26 ENCOUNTER — Encounter: Payer: Self-pay | Admitting: Emergency Medicine

## 2021-12-26 DIAGNOSIS — K0889 Other specified disorders of teeth and supporting structures: Secondary | ICD-10-CM

## 2021-12-26 DIAGNOSIS — K047 Periapical abscess without sinus: Secondary | ICD-10-CM | POA: Diagnosis not present

## 2021-12-26 DIAGNOSIS — J0100 Acute maxillary sinusitis, unspecified: Secondary | ICD-10-CM

## 2021-12-26 DIAGNOSIS — M545 Low back pain, unspecified: Secondary | ICD-10-CM

## 2021-12-26 MED ORDER — LIDOCAINE VISCOUS HCL 2 % MT SOLN: 10.0000 mL | Freq: Four times a day (QID) | OROMUCOSAL | 0 refills | Status: DC | PRN

## 2021-12-26 MED ORDER — AMOXICILLIN-POT CLAVULANATE 875-125 MG PO TABS: 1.0000 | ORAL_TABLET | Freq: Two times a day (BID) | ORAL | 0 refills | Status: DC

## 2021-12-26 NOTE — Discharge Instructions (Addendum)
Finish the Augmentin, even if you feel better.  Saline nasal irrigation with a Milta Deiters Med rinse and distilled water as often as you want.  Tylenol 1000 mg 3-4 times a day as needed for pain.  Do not take any NSAIDs such as ibuprofen, Aleve.  Viscous lidocaine or clove oil to help numb your tooth.  Salt water rinses and Listerine to help irrigate your mouth out and keep it clean.  Your x-rays did not show any acute changes or problems with the hardware.  Heat or ice, whichever feels better.  Tylenol will help with the pain.  Please follow-up with your orthopedic or neurosurgeon if your back continues to bother you.

## 2021-12-26 NOTE — ED Provider Notes (Signed)
HPI  SUBJECTIVE:  Earl Murray is a 66 y.o. male who presents with 2 issues: First, he reports 3 days of clear, foul tasting rhinorrhea, sinus pain and pressure, and worsening of his left upper tooth pain.  He has had a broken left upper tooth over the past 1 to 2 months, and he plans to have it pulled next Friday.  He states that he had some tooth break off while eating, and it is now sensitive to temperature/air, eating and drinking.  He reports a constant, dull, achy throbbing pain.  No facial swelling.  No allergy symptoms, fevers.  He has been taking Tylenol and ibuprofen without improvement in his symptoms.  No alleviating factors.  Symptoms are worse with exposure to air, temperature, eating, drinking.  Second, he reports having a slip and fall on his stairs, landing directly onto his back earlier today.  Denies hitting his head, loss of consciousness.  He reports new swelling in the right lumbar area.  Thinks that he may have aggravated his back.  No saddle anesthesia, urinary or fecal incontinence, urinary retention, leg weakness, numbness or tingling.  Patient has a past medical history of PE status post thrombectomy, on Eliquis.  He also has a history of DVT, history of chronic kidney disease stage IIIa, he states his kidneys have completely recovered after discontinuing meloxicam.  He also has a history of hypertension, chronic back pain status post multiple surgeries, frequent sinusitis.  No history of diabetes, allergies.  PCP: At the [facility]    Past Medical History:  Diagnosis Date   Hypertension    Neck injury     Past Surgical History:  Procedure Laterality Date   BACK SURGERY     CHOLECYSTECTOMY     ELBOW SURGERY     ERCP N/A 10/27/2019   Procedure: ENDOSCOPIC RETROGRADE CHOLANGIOPANCREATOGRAPHY (ERCP);  Surgeon: Lucilla Lame, MD;  Location: [facility] ENDOSCOPY;  Service: Endoscopy;  Laterality: N/A;   FOOT SURGERY     HERNIA REPAIR     4 hernia repairs   PULMONARY THROMBECTOMY N/A  10/14/2021   Procedure: PULMONARY THROMBECTOMY;  Surgeon: Katha Cabal, MD;  Location: [facility];  Service: Cardiovascular;  Laterality: N/A;    Family History  Problem Relation Age of Onset   Hypertension Mother    Heart disease Mother    Colon cancer Father     Social History   Tobacco Use   Smoking status: Former    Types: Cigarettes    Quit date: 05/2018    Years since quitting: 3.5   Smokeless tobacco: Former  Scientific laboratory technician Use: Never used  Substance Use Topics   Alcohol use: Not Currently   Drug use: Not Currently    No current facility-administered medications for this encounter.  Current Outpatient Medications:    amLODipine (NORVASC) 10 MG tablet, Take 10 mg by mouth daily. , Disp: , Rfl:    amoxicillin-clavulanate (AUGMENTIN) 875-125 MG tablet, Take 1 tablet by mouth every 12 (twelve) hours., Disp: 14 tablet, Rfl: 0   APIXABAN (ELIQUIS) VTE STARTER PACK ('10MG'$  AND '5MG'$ ), Take as directed on package: start with two-'5mg'$  tablets twice daily for 7 days. On day 8, switch to one-'5mg'$  tablet twice daily., Disp: 1 each, Rfl: 0   hydrochlorothiazide (HYDRODIURIL) 25 MG tablet, Take 1 tablet (25 mg total) by mouth daily., Disp: 90 tablet, Rfl: 1   lidocaine (XYLOCAINE) 2 % solution, Use as directed 10 mLs in the mouth or throat every 6 (  six) hours as needed for mouth pain. Hold in mouth and spit. Do not swallow., Disp: 100 mL, Rfl: 0   acetaminophen (TYLENOL) 325 MG tablet, Take 2 tablets (650 mg total) by mouth every 6 (six) hours as needed for mild pain (or Fever >/= 101)., Disp: , Rfl:    dicyclomine (BENTYL) 10 MG capsule, Take 1 capsule (10 mg total) by mouth 3 (three) times daily as needed for up to 14 days for spasms. or abdominal pain, Disp: 30 capsule, Rfl: 0   oxyCODONE-acetaminophen (PERCOCET/ROXICET) 5-325 MG tablet, Take 1 tablet by mouth in the morning, at noon, and at bedtime., Disp: , Rfl:   Allergies  Allergen Reactions   Duloxetine Other  (See Comments)   Lisinopril Cough   Tylenol With Codeine #3 [Acetaminophen-Codeine] Itching   Motrin [Ibuprofen] Rash   Tramadol Rash     ROS  As noted in HPI.   Physical Exam  BP (!) 163/90 (BP Location: Left Arm)   Pulse 98   Temp 98.7 F (37.1 C) (Oral)   Resp 15   Ht '6\' 5"'$  (1.956 m)   Wt 86.2 kg   SpO2 98%   BMI 22.53 kg/m   Constitutional: Well developed, well nourished, no acute distress Eyes:  EOMI, conjunctiva normal bilaterally HENT: Normocephalic, atraumatic,mucus membranes moist.  No appreciable nasal congestion.  Normal turbinates.  Positive left maxillary sinus tenderness.  Tooth 11, left upper cuspid broken, tender to palpation.  Positive surrounding gingival tenderness.  No gingival swelling, expressible purulent drainage. Respiratory: Normal inspiratory effort Cardiovascular: Normal rate GI: nondistended skin: No rash, skin intact Musculoskeletal: Positive diffuse tenderness over lower back this is not new or different.  Large midline scar.  Positive tender paraspinal mass right lower back.  Skin intact.  No bruising.  Gait normal. Neurologic: Alert & oriented x 3, no focal neuro deficits Psychiatric: Speech and behavior appropriate   ED Course   Medications - No data to display  Orders Placed This Encounter  Procedures   DG Lumbar Spine Complete    Standing Status:   Standing    Number of Occurrences:   1    Order Specific Question:   Reason for Exam (SYMPTOM  OR DIAGNOSIS REQUIRED)    Answer:   Status post multiple surgeries.  Slip and fall with direct trauma today.  Positive tender palpable mass right lower back.  Rule out hardware movement, fracture    No results found for this or any previous visit (from the past 24 hour(s)). DG Lumbar Spine Complete  Result Date: 12/26/2021 CLINICAL DATA:  Slip and fall EXAM: LUMBAR SPINE - COMPLETE 4+ VIEW COMPARISON:  None Available. FINDINGS: Unchanged postoperative appearance of the lumbar spine with  posterior fusion from T10 through S1 and including the sacroiliac joints, bridging a high-grade fracture deformity of L4. No evidence of perihardware fracture or loosening. Nonobstructive pattern of overlying bowel gas. IMPRESSION: Unchanged postoperative appearance of the lumbar spine with posterior fusion from T10 through S1 and including the sacroiliac joints, bridging a high-grade fracture deformity of L4. No evidence of perihardware fracture or loosening. Electronically Signed   By: Delanna Ahmadi M.D.   On: 12/26/2021 20:43    ED Clinical Impression  1. Acute non-recurrent maxillary sinusitis   2. Dental infection   3. Pain, dental   4. Acute right-sided low back pain without sciatica      ED Assessment/Plan  Calculated creatinine clearance from labs done on 10/15/2021 87 mL/min  1.  Dental pain/sinusitis.  Suspect sinusitis is from an dental infection.  We will treat with 1 week of Augmentin, saline nasal irrigation, salt water rinses, Listerine, Tylenol viscous lidocaine and clove oil.  Patient would like a dental block.  He has plans to get this tooth pulled next Friday.  Discussed with patient that he cannot take ibuprofen while taking the Eliquis.  Procedure note: After obtaining verbal consent, used 0.5 cc of 1% plain lidocaine via local infiltration around the tooth with complete pain relief.  Patient tolerated procedure well.  2.  Acute on chronic back pain status post fall.  Patient has a new tender, hard mass post direct trauma to the back.  He has no signs or symptoms concerning for cord involvement or spinal/epidural hematoma at this point.  will x-ray area to rule out change in the hardware or new fracture.    Reviewed imaging independently.  No evidence of perihardware fracture or loosening see radiology report for full details.  X-ray negative for fracture or dislocation of the hardware.  May do Tylenol, ice or heat, whichever feels better.  He is to follow-up with his  neurosurgeon or orthopedic surgeon if his back continues to bother him.  Discussed  imaging, MDM, treatment plan, and plan for follow-up with patient. Discussed sn/sx that should prompt return to the ED. patient agrees with plan.   Meds ordered this encounter  Medications   lidocaine (XYLOCAINE) 2 % solution    Sig: Use as directed 10 mLs in the mouth or throat every 6 (six) hours as needed for mouth pain. Hold in mouth and spit. Do not swallow.    Dispense:  100 mL    Refill:  0   amoxicillin-clavulanate (AUGMENTIN) 875-125 MG tablet    Sig: Take 1 tablet by mouth every 12 (twelve) hours.    Dispense:  14 tablet    Refill:  0      *This clinic note was created using [employer]. Therefore, there may be occasional mistakes despite careful proofreading.  ?    Melynda Ripple, MD 12/26/21 2056

## 2021-12-26 NOTE — ED Triage Notes (Signed)
Patient reports sinus drainage for the past 3 days.  Patient reports left tooth pain for the past 4 days.  Patient states that he has a cracked tooth and will be having it fixed next week.

## 2022-03-10 ENCOUNTER — Other Ambulatory Visit: Payer: Self-pay

## 2022-03-10 DIAGNOSIS — Z981 Arthrodesis status: Secondary | ICD-10-CM

## 2022-03-12 ENCOUNTER — Ambulatory Visit: Payer: Medicaid Other | Admitting: Neurosurgery

## 2022-03-23 ENCOUNTER — Encounter (INDEPENDENT_AMBULATORY_CARE_PROVIDER_SITE_OTHER): Payer: Self-pay

## 2022-05-11 ENCOUNTER — Ambulatory Visit: Payer: Medicare Other

## 2022-06-03 ENCOUNTER — Other Ambulatory Visit: Payer: Self-pay

## 2022-06-03 DIAGNOSIS — Z981 Arthrodesis status: Secondary | ICD-10-CM

## 2022-06-04 ENCOUNTER — Encounter: Payer: Self-pay | Admitting: Neurosurgery

## 2022-06-04 ENCOUNTER — Ambulatory Visit (INDEPENDENT_AMBULATORY_CARE_PROVIDER_SITE_OTHER): Payer: Medicare Other | Admitting: Neurosurgery

## 2022-06-04 ENCOUNTER — Ambulatory Visit
Admission: RE | Admit: 2022-06-04 | Discharge: 2022-06-04 | Disposition: A | Discharge status: Home / Self Care | Payer: Medicare Other | Attending: Neurosurgery | Admitting: Neurosurgery

## 2022-06-04 ENCOUNTER — Ambulatory Visit
Admission: RE | Admit: 2022-06-04 | Discharge: 2022-06-04 | Disposition: A | Discharge status: Home / Self Care | Payer: Medicare Other | Source: Ambulatory Visit | Attending: Neurosurgery | Admitting: Neurosurgery

## 2022-06-04 VITALS — BP 113/59 | HR 59 | Ht 77.0 in | Wt 200.2 lb

## 2022-06-04 DIAGNOSIS — Z09 Encounter for follow-up examination after completed treatment for conditions other than malignant neoplasm: Secondary | ICD-10-CM

## 2022-06-04 DIAGNOSIS — G894 Chronic pain syndrome: Secondary | ICD-10-CM

## 2022-06-04 DIAGNOSIS — Z981 Arthrodesis status: Secondary | ICD-10-CM | POA: Insufficient documentation

## 2022-06-04 NOTE — Progress Notes (Signed)
DOS: 02/17/21 (T10-P PSF, L1-3 TLIF, L4 PSO)  HISTORY OF PRESENT ILLNESS: Earl Clock Sr. is status post extensive thoracolumbar reconstruction.  He is in the pain clinic at the [place].  He is on Suboxone.  He still has pain that comes and goes.  He is still better than he was prior to surgery.   PHYSICAL EXAMINATION:  Vitals:   06/04/22 1032  BP: (!) 113/59  Pulse: (!) 59    General: Patient is well developed, well nourished, calm, collected, and in no apparent distress.  NEUROLOGICAL:  General: In no acute distress.  Awake, alert, oriented to person, place, and time. Pupils equal round and reactive to light. Facial tone is symmetric. Tongue protrusion is midline. There is no pronator drift.  Strength:  Side Iliopsoas Quads Hamstring PF DF EHL  R '5 5 5 5 5 5  '$ L '5 5 5 5 5 5   '$ Incision c/d/i  ROS (Neurologic): Negative except as noted above  MEDICAL DECISION-MAKING: Imaging reviewed. His left S1 screw appears to have disconnected from the rod .  ASSESSMENT/PLAN:  Earl Clock Sr. is doing better after extensive thoracolumbar fusion. He is still doing much better than it was prior to surgery. I am happy with his current x-rays.  I we will see him back on an as-needed basis.  We did discuss that he could pursue spinal cord stimulation via the [place] if he decides to do that.  Advised to contact the office if any questions or concerns arise.  Angeleen Horney K. Izora Ribas MD, [place]

## 2022-08-08 ENCOUNTER — Other Ambulatory Visit: Payer: Self-pay

## 2022-08-08 ENCOUNTER — Emergency Department
Admission: EM | Admit: 2022-08-08 | Discharge: 2022-08-08 | Discharge status: Against Medical Advice | Payer: 59 | Attending: Emergency Medicine | Admitting: Emergency Medicine

## 2022-08-08 ENCOUNTER — Encounter: Payer: Self-pay | Admitting: Intensive Care

## 2022-08-08 ENCOUNTER — Emergency Department: Payer: 59

## 2022-08-08 DIAGNOSIS — Z5321 Procedure and treatment not carried out due to patient leaving prior to being seen by health care provider: Secondary | ICD-10-CM | POA: Insufficient documentation

## 2022-08-08 DIAGNOSIS — M549 Dorsalgia, unspecified: Secondary | ICD-10-CM | POA: Diagnosis not present

## 2022-08-08 DIAGNOSIS — W108XXA Fall (on) (from) other stairs and steps, initial encounter: Secondary | ICD-10-CM | POA: Insufficient documentation

## 2022-08-08 NOTE — ED Triage Notes (Signed)
Patient presents from home after fall down 14 stairs. Denies hitting head or LOC. FD reported to EMS that his legs were bent behind him.  Patient prescribed pain medication baseline by MD  EMS stated patient was able to ambulate to stretcher for them and into wheelchair at hospital  History back surgeries and chronic back pain.   EMS vitals: 170/110 b/p 98pulse 96% RA

## 2022-08-09 ENCOUNTER — Emergency Department
Admission: EM | Admit: 2022-08-09 | Discharge: 2022-08-09 | Disposition: A | Discharge status: Home / Self Care | Payer: 59 | Attending: Emergency Medicine | Admitting: Emergency Medicine

## 2022-08-09 ENCOUNTER — Emergency Department: Payer: 59

## 2022-08-09 ENCOUNTER — Other Ambulatory Visit: Payer: Self-pay

## 2022-08-09 DIAGNOSIS — M79604 Pain in right leg: Secondary | ICD-10-CM

## 2022-08-09 DIAGNOSIS — W19XXXA Unspecified fall, initial encounter: Secondary | ICD-10-CM

## 2022-08-09 DIAGNOSIS — M545 Low back pain, unspecified: Secondary | ICD-10-CM | POA: Diagnosis not present

## 2022-08-09 DIAGNOSIS — I129 Hypertensive chronic kidney disease with stage 1 through stage 4 chronic kidney disease, or unspecified chronic kidney disease: Secondary | ICD-10-CM | POA: Insufficient documentation

## 2022-08-09 DIAGNOSIS — N189 Chronic kidney disease, unspecified: Secondary | ICD-10-CM | POA: Insufficient documentation

## 2022-08-09 DIAGNOSIS — N179 Acute kidney failure, unspecified: Secondary | ICD-10-CM | POA: Diagnosis not present

## 2022-08-09 DIAGNOSIS — W108XXA Fall (on) (from) other stairs and steps, initial encounter: Secondary | ICD-10-CM | POA: Insufficient documentation

## 2022-08-09 DIAGNOSIS — M79662 Pain in left lower leg: Secondary | ICD-10-CM | POA: Insufficient documentation

## 2022-08-09 DIAGNOSIS — G8929 Other chronic pain: Secondary | ICD-10-CM | POA: Insufficient documentation

## 2022-08-09 DIAGNOSIS — M79661 Pain in right lower leg: Secondary | ICD-10-CM | POA: Insufficient documentation

## 2022-08-09 DIAGNOSIS — Z7901 Long term (current) use of anticoagulants: Secondary | ICD-10-CM | POA: Diagnosis not present

## 2022-08-09 DIAGNOSIS — E876 Hypokalemia: Secondary | ICD-10-CM | POA: Diagnosis not present

## 2022-08-09 DIAGNOSIS — M79671 Pain in right foot: Secondary | ICD-10-CM | POA: Insufficient documentation

## 2022-08-09 DIAGNOSIS — M79672 Pain in left foot: Secondary | ICD-10-CM | POA: Diagnosis not present

## 2022-08-09 DIAGNOSIS — R531 Weakness: Secondary | ICD-10-CM | POA: Diagnosis present

## 2022-08-09 DIAGNOSIS — D72829 Elevated white blood cell count, unspecified: Secondary | ICD-10-CM | POA: Insufficient documentation

## 2022-08-09 LAB — CBC
HCT: 41.2 % (ref 39.0–52.0)
Hemoglobin: 13.9 g/dL (ref 13.0–17.0)
MCH: 29.7 pg (ref 26.0–34.0)
MCHC: 33.7 g/dL (ref 30.0–36.0)
MCV: 88 fL (ref 80.0–100.0)
Platelets: 370 10*3/uL (ref 150–400)
RBC: 4.68 MIL/uL (ref 4.22–5.81)
RDW: 14.6 % (ref 11.5–15.5)
WBC: 13.1 10*3/uL — ABNORMAL HIGH (ref 4.0–10.5)
nRBC: 0 % (ref 0.0–0.2)

## 2022-08-09 LAB — BASIC METABOLIC PANEL
Anion gap: 7 (ref 5–15)
BUN: 23 mg/dL (ref 8–23)
CO2: 25 mmol/L (ref 22–32)
Calcium: 9.4 mg/dL (ref 8.9–10.3)
Chloride: 103 mmol/L (ref 98–111)
Creatinine, Ser: 1.37 mg/dL — ABNORMAL HIGH (ref 0.61–1.24)
GFR, Estimated: 56 mL/min — ABNORMAL LOW (ref >60)
Glucose, Bld: 114 mg/dL — ABNORMAL HIGH (ref 70–99)
Potassium: 3.2 mmol/L — ABNORMAL LOW (ref 3.5–5.1)
Sodium: 135 mmol/L (ref 135–145)

## 2022-08-09 MED ORDER — POTASSIUM CHLORIDE CRYS ER 20 MEQ PO TBCR
40.0000 meq | EXTENDED_RELEASE_TABLET | Freq: Once | ORAL | Status: AC
  Administered 2022-08-09: 40 meq via ORAL
  Filled 2022-08-09: qty 2

## 2022-08-09 MED ORDER — DICLOFENAC SODIUM 1 % EX GEL: 4.0000 g | Freq: Four times a day (QID) | CUTANEOUS | 1 refills | Status: DC

## 2022-08-09 MED ORDER — OXYCODONE-ACETAMINOPHEN 5-325 MG PO TABS
1.0000 | ORAL_TABLET | Freq: Once | ORAL | Status: AC
  Administered 2022-08-09: 1 via ORAL
  Filled 2022-08-09: qty 1

## 2022-08-09 NOTE — ED Triage Notes (Signed)
Pt comes via EMS with c/o fall sliding down some stairs. Pts he didn't hit head or no loc. Pt is on thinners. Pt states increased weakness and fell again this morning. Pt states legs just gave out. Pt states lower back pain

## 2022-08-09 NOTE — ED Provider Notes (Signed)
[facility] Provider Note    Event Date/Time   First MD Initiated Contact with Patient 08/09/22 1254     (approximate)   History   Chief Complaint Weakness   HPI  Earl Murray is a 67 y.o. male with past medical history of hypertension, hyperlipidemia, DVT/PE on Eliquis, CKD, and polysubstance abuse who presents to the ED complaining of weakness.  Patient reports that he has been feeling increasingly weak with pain in both of his lower legs for about the past 3 days.  He describes the pain as tingling and electric, affecting the area from his mid calves downwards.  He reports 2 separate falls on the stairs, 1 yesterday and one earlier today.  His states that his legs seem to give out on him and he fell onto his backside, sliding down the remainder of the stairs.  He denies hitting his head or losing consciousness, complains of pain in his lower back since the fall.  Pain in his legs was present prior to the fall.  He denies any fevers, cough, chest pain, shortness of breath, nausea, vomiting, diarrhea, or dysuria.  He continues to take Eliquis for recently diagnosed PE.     Physical Exam   Triage Vital Signs: ED Triage Vitals  Enc Vitals Group     BP 08/09/22 1138 (!) 142/86     Pulse Rate 08/09/22 1138 100     Resp 08/09/22 1138 17     Temp 08/09/22 1138 98 F (36.7 C)     Temp src --      SpO2 08/09/22 1138 99 %     Weight --      Height --      Head Circumference --      Peak Flow --      Pain Score 08/09/22 1137 10     Pain Loc --      Pain Edu? --      Excl. in [place]? --     Most recent vital signs: Vitals:   08/09/22 1138  BP: (!) 142/86  Pulse: 100  Resp: 17  Temp: 98 F (36.7 C)  SpO2: 99%    Constitutional: Alert and oriented. Eyes: Conjunctivae are normal. Head: Atraumatic. Nose: No congestion/rhinnorhea. Mouth/Throat: Mucous membranes are moist.  Neck: No midline cervical spine tenderness to palpation. Cardiovascular: Normal  rate, regular rhythm. Grossly normal heart sounds.  2+ radial and DP pulses bilaterally. Respiratory: Normal respiratory effort.  No retractions. Lungs CTAB.  No chest wall tenderness to palpation noted. Gastrointestinal: Soft and nontender. No distention. Musculoskeletal: Tenderness to palpation at bilateral ankles with no tenderness to palpation of bilateral knees or along both feet.  No obvious deformities noted and no edema, erythema, or warmth noted.  No calf tenderness noted.  Midline lumbar spinal tenderness to palpation noted.  No midline thoracic spinal tenderness to palpation noted.  No upper extremity bony tenderness to palpation. Neurologic:  Normal speech and language. No gross focal neurologic deficits are appreciated.    ED Results / Procedures / Treatments   Labs (all labs ordered are listed, but only abnormal results are displayed) Labs Reviewed  BASIC METABOLIC PANEL - Abnormal; Notable for the following components:      Result Value   Potassium 3.2 (*)    Glucose, Bld 114 (*)    Creatinine, Ser 1.37 (*)    GFR, Estimated 56 (*)    All other components within normal limits  CBC - Abnormal; Notable for  the following components:   WBC 13.1 (*)    All other components within normal limits  URINALYSIS, ROUTINE W REFLEX MICROSCOPIC  CBG MONITORING, ED     EKG  ED ECG REPORT I, Blake Divine, the attending physician, personally viewed and interpreted this ECG.   Date: 08/09/2022  EKG Time: 11:39  Rate: 92  Rhythm: normal sinus rhythm  Axis: Normal  Intervals:none  ST&T Change: None  RADIOLOGY Chest x-ray reviewed and interpreted by me with no infiltrate, edema, or effusion.  PROCEDURES:  Critical Care performed: No  Procedures   MEDICATIONS ORDERED IN ED: Medications  oxyCODONE-acetaminophen (PERCOCET/ROXICET) 5-325 MG per tablet 1 tablet (1 tablet Oral Given 08/09/22 1409)  potassium chloride SA (KLOR-CON M) CR tablet 40 mEq (40 mEq Oral Given  08/09/22 1412)     IMPRESSION / MDM / ASSESSMENT AND PLAN / ED COURSE  I reviewed the triage vital signs and the nursing notes.                              67 y.o. male with past medical history of hypertension, hyperlipidemia, DVT/PE on Eliquis, CKD, and polysubstance abuse who presents to the ED for increasing weakness and pain in his lower legs for the past 3 days, now with multiple falls.  Patient's presentation is most consistent with acute presentation with potential threat to life or bodily function.  Differential diagnosis includes, but is not limited to, intracranial injury, cervical spine injury, spinal injury, ankle injury, arterial insufficiency, venous insufficiency, DVT, cellulitis, abscess, electrolyte abnormality, peripheral neuropathy.  Patient nontoxic-appearing and in no acute distress, vital signs are unremarkable.  He is neurovascular intact to his bilateral lower extremities and there are no signs or symptoms concerning for infection or DVT.  With his fall, we will further assess with x-rays of both ankles, but no obvious deformities noted.  He was seen in the ED yesterday for fall and had CT imaging of his head and cervical spine performed at that time, both of which were negative.  On his fall earlier today, he denies hitting his head or losing consciousness, will hold off on repeat imaging of his head.  He has no focal neurologic deficits on exam, describes generalized weakness and I doubt stroke.  Labs show mild AKI with hypokalemia, along with mild leukocytosis, no significant anemia noted.  We will assess for infectious process contributing to his weakness with chest x-ray and urinalysis.  Chest x-ray is unremarkable, x-rays of lumbar spine as well as bilateral ankles are also negative for acute traumatic injury.  Patient unable to provide urine sample but denies any urinary symptoms and I doubt UTI at this time.  Patient is appropriate for discharge home with PCP  follow-up, description of pain in his legs sounds most consistent with a peripheral neuropathy.  There are no findings concerning for cauda equina and we will prescribe Voltaren gel for his leg pain.  He was counseled to return to the ED for new or worsening symptoms, patient agrees with plan.      FINAL CLINICAL IMPRESSION(S) / ED DIAGNOSES   Final diagnoses:  Bilateral leg and foot pain  Fall, initial encounter  Chronic midline low back pain without sciatica     Rx / DC Orders   ED Discharge Orders          Ordered    diclofenac Sodium (VOLTAREN) 1 % GEL  4 times daily  08/09/22 1553             Note:  This document was prepared using Dragon voice recognition software and may include unintentional dictation errors.   Blake Divine, MD 08/09/22 [phone]

## 2022-08-25 ENCOUNTER — Ambulatory Visit (INDEPENDENT_AMBULATORY_CARE_PROVIDER_SITE_OTHER): Payer: 59 | Admitting: Neurosurgery

## 2022-08-25 ENCOUNTER — Ambulatory Visit
Admission: RE | Admit: 2022-08-25 | Discharge: 2022-08-25 | Disposition: A | Discharge status: Home / Self Care | Payer: 59 | Source: Ambulatory Visit | Attending: Neurosurgery | Admitting: Neurosurgery

## 2022-08-25 ENCOUNTER — Encounter: Payer: Self-pay | Admitting: Neurosurgery

## 2022-08-25 VITALS — BP 150/90 | HR 76 | Ht 77.0 in | Wt 202.0 lb

## 2022-08-25 DIAGNOSIS — M4714 Other spondylosis with myelopathy, thoracic region: Secondary | ICD-10-CM

## 2022-08-25 DIAGNOSIS — M546 Pain in thoracic spine: Secondary | ICD-10-CM

## 2022-08-25 MED ORDER — HYDROCODONE-ACETAMINOPHEN 5-325 MG PO TABS: 1.0000 | ORAL_TABLET | Freq: Two times a day (BID) | ORAL | 0 refills | Status: AC | PRN

## 2022-08-25 NOTE — Progress Notes (Signed)
Follow-up note: Referring Physician:  No referring provider defined for this encounter.  Primary Physician:  [facility]  Chief Complaint: Sensation of electricity running down his legs  DOS: 02/17/21 (T10-P PSF, L1-3 TLIF, L4 PSO)  History of Present Illness: Earl Murray is a 67 y.o. 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  He states that this came on about 3 weeks ago without any inciting event.  About a week ago he suffered 2 consecutive falls and has now had increased thoracic back pain.  He states he has been falling more recently he feels as though his legs are giving out on him.  LOV 06/04/22 Earl Clock Sr. is status post extensive thoracolumbar reconstruction.  He is in the pain clinic at the [place].  He is on Suboxone.  He still has pain that comes and goes.  He is still better than he was prior to surgery.   Review of Systems:  A 10 point review of systems is negative,  and the pertinent positives and negatives detailed in the HPI.  Past Medical History: Past Medical History:  Diagnosis Date   Bladder cancer ([place])    Choledocholithiasis    Chronic kidney disease    Cocaine dependence in remission ([place])    Concussion    Depression    DVT (deep venous thrombosis) ([facility])    GERD (gastroesophageal reflux disease)    Hepatitis C    History of cancer    Hyperlipidemia    Hypertension    Migraine    Neck injury    Opioid dependence in remission ([place])    Pancreatitis    Prediabetes    Scoliosis    Sleep apnea     Past Surgical History: Past Surgical History:  Procedure Laterality Date   BACK SURGERY     CHOLECYSTECTOMY     ELBOW SURGERY     ERCP N/A 10/27/2019   Procedure: ENDOSCOPIC RETROGRADE CHOLANGIOPANCREATOGRAPHY (ERCP);  Surgeon: Lucilla Lame, MD;   Location: [facility] ENDOSCOPY;  Service: Endoscopy;  Laterality: N/A;   FOOT SURGERY     HERNIA REPAIR     4 hernia repairs   PULMONARY THROMBECTOMY N/A 10/14/2021   Procedure: PULMONARY THROMBECTOMY;  Surgeon: Katha Cabal, MD;  Location: [facility] CV LAB;  Service: Cardiovascular;  Laterality: N/A;    Allergies: Allergies as of 08/25/2022 - Review Complete 08/09/2022  Allergen Reaction Noted   Duloxetine Other (See Comments) 11/13/2016   Lisinopril Cough 07/10/2005   Tylenol with codeine #3 [acetaminophen-codeine] Itching 03/01/2019   Ibuprofen Rash and Other (See Comments) 10/23/2017   Tramadol Rash 10/23/2017    Medications: Outpatient Encounter Medications as of 08/25/2022  Medication Sig   amLODipine (NORVASC) 10 MG tablet Take 10 mg by mouth daily.    amoxicillin-clavulanate (AUGMENTIN) 875-125 MG tablet Take 1 tablet by mouth every 12 (twelve) hours.   APIXABAN (ELIQUIS) VTE STARTER PACK (10MG  AND 5MG ) Take as directed on package: start with two-5mg  tablets twice daily for 7 days. On day 8, switch to one-5mg  tablet twice daily.   buprenorphine-naloxone (SUBOXONE) 8-2 mg SUBL SL tablet Place 3 tablets under the tongue daily.   chlorthalidone (HYGROTON) 25 MG tablet Take 25 mg by mouth daily.   diclofenac Sodium (VOLTAREN) 1 % GEL Apply 4 g topically 4 (four) times daily.  hydrochlorothiazide (HYDRODIURIL) 25 MG tablet Take 1 tablet (25 mg total) by mouth daily.   hydrOXYzine (ATARAX) 25 MG tablet Take 25 mg by mouth 2 (two) times daily as needed for anxiety.   lidocaine (XYLOCAINE) 2 % solution Use as directed 10 mLs in the mouth or throat every 6 (six) hours as needed for mouth pain. Hold in mouth and spit. Do not swallow.   PARoxetine (PAXIL) 20 MG tablet Take 20 mg by mouth daily.   [DISCONTINUED] gabapentin (NEURONTIN) 300 MG capsule gabapentin 300 mg capsule  one tab bid-tid   [DISCONTINUED] ipratropium (ATROVENT) 0.06 % nasal spray Place 2 sprays into both nostrils 4  (four) times daily as needed for rhinitis.   [DISCONTINUED] levocetirizine (XYZAL) 5 MG tablet Take 1 tablet (5 mg total) by mouth every evening.   No facility-administered encounter medications on file as of 08/25/2022.    Social History: Social History   Tobacco Use   Smoking status: Former    Types: Cigarettes    Quit date: 05/2018    Years since quitting: 4.2   Smokeless tobacco: Former  Scientific laboratory technician Use: Never used  Substance Use Topics   Alcohol use: Not Currently   Drug use: Yes    Comment: prescribed narcotics for chronic back pain    Family Medical History: Family History  Problem Relation Age of Onset   Hypertension Mother    Heart disease Mother    Colon cancer Father     Exam: Today's Vitals   08/25/22 1101 08/25/22 1121  BP: (!) 150/90 (!) 150/90  Pulse: 76   SpO2: 99%   Weight: 91.6 kg   Height: 6\' 5"  (1.956 m)    Body mass index is 23.95 kg/m.   General: A&Ox3 ROM of spine: Limited due to pain.  Palpation of spine: Nontender.  Strength in the left lower extremity is EHL 5/5, Dorsiflexion 5/5, Plantar flexion 5/5, Hamstring 5/5, Quadricep 5/5, Iliopsoas 5/5. Strength in the right lower extremity is EHL 5/5, Dorsiflexion 5/5, Plantar flexion 5/5, Hamstring 5/5, Quadricep 5/5, Iliopsoas 5/5. Reflexes are 1+ and symmetric at the patella and achilles.   Bilateral lower extremity sensation diminished bilaterally.  Clonus is negative toes down-going.  Gait is antalgic  Imaging: 08/09/22 xrays L spine FINDINGS: The patient is status post instrumented posterior spinal fusion from the lower thoracic spine to the sacroiliac joints. The hardware appears intact and well aligned. There is unchanged compression of the L4 vertebral body. No acute osseous injury is identified. No traumatic listhesis.   IMPRESSION: No acute osseous injury.     Electronically Signed   By: Zerita Boers M.D.   On: 08/09/2022 14:42    I have personally reviewed the images  and agree with the above interpretation.  Assessment and Plan: [name] is a pleasant 67 y.o. male with history of thoracolumbar fusion with a 3-week onset of electric shock leg pain in his bilateral lower extremities and increased falls.  I am concerned for adjacent level disease.  The x-rays done in the ER did not show any significant hardware failure however they do not fully evaluate the thoracolumbar junction.  I have ordered thoracic x-rays to evaluate this further as well as a thoracic MRI.  In the meantime encouraged him to increase his nightly gabapentin to 300 mg as he is currently only taking 100 3 times daily.  I have also given him a small prescription for Norco to take as needed for severe pain but  further refills will need to come from his pain management or primary care provider.  His blood pressure was elevated x 2 readings today however the patient did not take his blood pressure medicine prior to his visit.  I encouraged him to monitor this at home and contact his primary care provider with persistent elevation.  I will contact him with results of his studies and further plan of care.  He expressed understanding was in agreement with this plan.   I spent a total of 30 minutes in both face-to-face and non-face-to-face activities for this visit on the date of this encounter including review of outside records, review of imaging, discussion of differential diagnosis, physical exam, documentation, and order placement.  Cooper Render PA-C Neurosurgery

## 2022-09-02 ENCOUNTER — Ambulatory Visit
Admission: RE | Admit: 2022-09-02 | Discharge: 2022-09-02 | Disposition: A | Discharge status: Home / Self Care | Payer: 59 | Source: Ambulatory Visit | Attending: Neurosurgery | Admitting: Neurosurgery

## 2022-09-02 DIAGNOSIS — M4714 Other spondylosis with myelopathy, thoracic region: Secondary | ICD-10-CM | POA: Diagnosis present

## 2022-09-11 ENCOUNTER — Ambulatory Visit (INDEPENDENT_AMBULATORY_CARE_PROVIDER_SITE_OTHER): Payer: 59 | Admitting: Neurosurgery

## 2022-09-11 DIAGNOSIS — M546 Pain in thoracic spine: Secondary | ICD-10-CM

## 2022-09-11 NOTE — Progress Notes (Signed)
Two attempts made to call the patient regarding his telephone visit and review of his imaging. Will send a follow up mychart message.

## 2022-09-15 NOTE — Telephone Encounter (Signed)
Pt called today, I read him the mychart message Danielle sent on 09/11/22.  He said he is interested in the nerve study.   CB: [phone]

## 2022-09-16 ENCOUNTER — Other Ambulatory Visit: Payer: Self-pay | Admitting: Neurosurgery

## 2022-09-16 DIAGNOSIS — G629 Polyneuropathy, unspecified: Secondary | ICD-10-CM

## 2022-09-22 NOTE — Telephone Encounter (Signed)
Referral has been faxed to [facility].  °

## 2022-10-05 ENCOUNTER — Telehealth: Payer: Self-pay | Admitting: Neurosurgery

## 2022-10-05 NOTE — Telephone Encounter (Signed)
Patient seen Earl Murray on 09/11/2022. He is having a lot of pain in both of his legs. Can he get pain medication or some kind of medication to help with the pain? He is taking Gabapentin like she prescribed him. 11/05/2022 is his EMG at [facility].  [facility] [place] is who is uses.

## 2022-10-06 ENCOUNTER — Emergency Department: Payer: 59

## 2022-10-06 ENCOUNTER — Other Ambulatory Visit: Payer: Self-pay

## 2022-10-06 ENCOUNTER — Emergency Department
Admission: EM | Admit: 2022-10-06 | Discharge: 2022-10-06 | Disposition: A | Discharge status: Home / Self Care | Payer: 59 | Attending: Emergency Medicine | Admitting: Emergency Medicine

## 2022-10-06 DIAGNOSIS — M25552 Pain in left hip: Secondary | ICD-10-CM | POA: Insufficient documentation

## 2022-10-06 DIAGNOSIS — S20229A Contusion of unspecified back wall of thorax, initial encounter: Secondary | ICD-10-CM

## 2022-10-06 DIAGNOSIS — M542 Cervicalgia: Secondary | ICD-10-CM | POA: Diagnosis not present

## 2022-10-06 DIAGNOSIS — N189 Chronic kidney disease, unspecified: Secondary | ICD-10-CM | POA: Diagnosis not present

## 2022-10-06 DIAGNOSIS — I129 Hypertensive chronic kidney disease with stage 1 through stage 4 chronic kidney disease, or unspecified chronic kidney disease: Secondary | ICD-10-CM | POA: Insufficient documentation

## 2022-10-06 DIAGNOSIS — W19XXXA Unspecified fall, initial encounter: Secondary | ICD-10-CM

## 2022-10-06 DIAGNOSIS — S299XXA Unspecified injury of thorax, initial encounter: Secondary | ICD-10-CM | POA: Diagnosis present

## 2022-10-06 DIAGNOSIS — W01198A Fall on same level from slipping, tripping and stumbling with subsequent striking against other object, initial encounter: Secondary | ICD-10-CM | POA: Diagnosis not present

## 2022-10-06 DIAGNOSIS — Y92009 Unspecified place in unspecified non-institutional (private) residence as the place of occurrence of the external cause: Secondary | ICD-10-CM | POA: Insufficient documentation

## 2022-10-06 DIAGNOSIS — S0990XA Unspecified injury of head, initial encounter: Secondary | ICD-10-CM | POA: Diagnosis not present

## 2022-10-06 DIAGNOSIS — Z7901 Long term (current) use of anticoagulants: Secondary | ICD-10-CM | POA: Diagnosis not present

## 2022-10-06 LAB — BASIC METABOLIC PANEL
Anion gap: 7 (ref 5–15)
BUN: 8 mg/dL (ref 8–23)
CO2: 27 mmol/L (ref 22–32)
Calcium: 9.4 mg/dL (ref 8.9–10.3)
Chloride: 107 mmol/L (ref 98–111)
Creatinine, Ser: 1.29 mg/dL — ABNORMAL HIGH (ref 0.61–1.24)
GFR, Estimated: >60 mL/min (ref >60)
Glucose, Bld: 101 mg/dL — ABNORMAL HIGH (ref 70–99)
Potassium: 3.3 mmol/L — ABNORMAL LOW (ref 3.5–5.1)
Sodium: 141 mmol/L (ref 135–145)

## 2022-10-06 LAB — CBC WITH DIFFERENTIAL/PLATELET
Abs Immature Granulocytes: 0.04 10*3/uL (ref 0.00–0.07)
Basophils Absolute: 0.1 10*3/uL (ref 0.0–0.1)
Basophils Relative: 1 %
Eosinophils Absolute: 0.1 10*3/uL (ref 0.0–0.5)
Eosinophils Relative: 1 %
HCT: 44.1 % (ref 39.0–52.0)
Hemoglobin: 14.6 g/dL (ref 13.0–17.0)
Immature Granulocytes: 0 %
Lymphocytes Relative: 31 %
Lymphs Abs: 3.3 10*3/uL (ref 0.7–4.0)
MCH: 30 pg (ref 26.0–34.0)
MCHC: 33.1 g/dL (ref 30.0–36.0)
MCV: 90.7 fL (ref 80.0–100.0)
Monocytes Absolute: 0.7 10*3/uL (ref 0.1–1.0)
Monocytes Relative: 6 %
Neutro Abs: 6.4 10*3/uL (ref 1.7–7.7)
Neutrophils Relative %: 61 %
Platelets: 274 10*3/uL (ref 150–400)
RBC: 4.86 MIL/uL (ref 4.22–5.81)
RDW: 16.8 % — ABNORMAL HIGH (ref 11.5–15.5)
WBC: 10.5 10*3/uL (ref 4.0–10.5)
nRBC: 0 % (ref 0.0–0.2)

## 2022-10-06 MED ORDER — NAPROXEN 500 MG PO TABS: 500.0000 mg | ORAL_TABLET | Freq: Two times a day (BID) | ORAL | 0 refills | Status: DC

## 2022-10-06 MED ORDER — KETOROLAC TROMETHAMINE 15 MG/ML IJ SOLN
15.0000 mg | Freq: Once | INTRAMUSCULAR | Status: AC
  Administered 2022-10-06: 15 mg via INTRAVENOUS
  Filled 2022-10-06: qty 1

## 2022-10-06 MED ORDER — HYDROMORPHONE HCL 1 MG/ML IJ SOLN
1.0000 mg | Freq: Once | INTRAMUSCULAR | Status: AC
  Administered 2022-10-06: 1 mg via INTRAVENOUS
  Filled 2022-10-06: qty 1

## 2022-10-06 NOTE — Telephone Encounter (Signed)
Patient said that he had to go to the ER for a fall. He will increase his gabapentin and see how it goes. He is aware that we can not prescribe pain medication.

## 2022-10-06 NOTE — Discharge Instructions (Addendum)
Your imaging tests of your head and spine today were all okay.  Continue taking naproxen 2 times a day for the next week to help with soreness.  Gentle activity such as walking and light stretching will also help you get back to normal quicker.

## 2022-10-06 NOTE — ED Notes (Signed)
Pt to CT

## 2022-10-06 NOTE — ED Provider Notes (Signed)
[facility] Provider Note    Event Date/Time   First MD Initiated Contact with Patient 10/06/22 1323     (approximate)   History   Chief Complaint: Fall   HPI  Earl Murray is a 67 y.o. male with a history of hypertension, hepatitis C, CKD, GERD, PE on Eliquis who comes the ED due to a fall.  He reports having chronic lower extremity neuropathy, lost his balance while starting to go down stairs at home, fell onto his back, hitting the back of his head.  No LOC.  Larey Seat down a full flight of steps.  Complains of pain in the neck and diffusely in the back, worse on the left.  Also reports compliance with his medications including Eliquis.  No change in muscle weakness or paresthesias.     Physical Exam   Triage Vital Signs: ED Triage Vitals  Enc Vitals Group     BP 10/06/22 1327 (!) 171/114     Pulse Rate 10/06/22 1327 73     Resp 10/06/22 1327 15     Temp 10/06/22 1327 98.2 F (36.8 C)     Temp Source 10/06/22 1327 Oral     SpO2 10/06/22 1327 99 %     Weight 10/06/22 1324 200 lb (90.7 kg)     Height 10/06/22 1324 6\' 5"  (1.956 m)     Head Circumference --      Peak Flow --      Pain Score 10/06/22 1323 10     Pain Loc --      Pain Edu? --      Excl. in GC? --     Most recent vital signs: Vitals:   10/06/22 1327  BP: (!) 171/114  Pulse: 73  Resp: 15  Temp: 98.2 F (36.8 C)  SpO2: 99%    General: Awake, no distress.  CV:  Good peripheral perfusion.  Regular rate rhythm Resp:  Normal effort.  Clear to auscultation bilaterally Abd:  No distention.  Soft nontender Other:  Diffuse spinal tenderness, no bony point tenderness or step-off.  Full range of motion all extremities.  He does have some pain at the left hip with passive range of motion.   ED Results / Procedures / Treatments   Labs (all labs ordered are listed, but only abnormal results are displayed) Labs Reviewed  CBC WITH DIFFERENTIAL/PLATELET - Abnormal; Notable for the  following components:      Result Value   RDW 16.8 (*)    All other components within normal limits  BASIC METABOLIC PANEL - Abnormal; Notable for the following components:   Potassium 3.3 (*)    Glucose, Bld 101 (*)    Creatinine, Ser 1.29 (*)    All other components within normal limits     EKG Interpreted by me Normal sinus rhythm rate of 60.  Normal axis intervals QRS ST segments and T waves.  No ischemic changes, no evidence of right heart strain or underlying arrhythmia.   RADIOLOGY CT head interpreted by me, negative for intracranial hemorrhage.  Radiology report reviewed  CT cervical spine unremarkable  X-ray thoracic and lumbar spine unremarkable.  X-ray pelvis unremarkable   PROCEDURES:  Procedures   MEDICATIONS ORDERED IN ED: Medications  ketorolac (TORADOL) 15 MG/ML injection 15 mg (has no administration in time range)  HYDROmorphone (DILAUDID) injection 1 mg (1 mg Intravenous Given 10/06/22 1340)     IMPRESSION / MDM / ASSESSMENT AND PLAN / ED COURSE  I  reviewed the triage vital signs and the nursing notes.  DDx: Intracranial hemorrhage, C-spine fracture, T-spine fracture, L-spine fracture, pelvis fracture, contusions  Patient's presentation is most consistent with acute presentation with potential threat to life or bodily function.  Patient presents after mechanical fall at home.  He is on Eliquis and did hit his head pretty hard.  No visible signs of trauma.  Will obtain CT head as well as imaging of CT and L-spine.  Will give IV Dilaudid for pain relief.  ----------------------------------------- 2:51 PM on 10/06/2022 ----------------------------------------- Imaging and labs all unremarkable.  Stable for discharge.       FINAL CLINICAL IMPRESSION(S) / ED DIAGNOSES   Final diagnoses:  Fall in home, initial encounter  Contusion of back wall of thorax, unspecified location, initial encounter     Rx / DC Orders   ED Discharge Orders           Ordered    naproxen (NAPROSYN) 500 MG tablet  2 times daily with meals        10/06/22 1451             Note:  This document was prepared using Dragon voice recognition software and may include unintentional dictation errors.   Sharman Cheek, MD 10/06/22 1451

## 2022-10-06 NOTE — Telephone Encounter (Signed)
Patient calling asking if his message as been reviewed.

## 2022-10-06 NOTE — ED Triage Notes (Signed)
Pt to ED from home [facility] Pt has non diabetic neuropathy, legs gave out and fell down 14 to 17 steps. No LOC, takes blood thinner Eliquis  204/117, takes HTN meds 68 HR, 12 lead normal, 100%  Complains of chronic back pain and HA. Pt hit back and posterior head on steps.   Pt is alert and oriented

## 2022-12-03 ENCOUNTER — Ambulatory Visit (INDEPENDENT_AMBULATORY_CARE_PROVIDER_SITE_OTHER): Payer: 59 | Admitting: Neurosurgery

## 2022-12-03 DIAGNOSIS — R29898 Other symptoms and signs involving the musculoskeletal system: Secondary | ICD-10-CM

## 2022-12-03 DIAGNOSIS — G629 Polyneuropathy, unspecified: Secondary | ICD-10-CM | POA: Diagnosis not present

## 2022-12-03 NOTE — Progress Notes (Signed)
Neurosurgery Telephone (Audio-Only) Note  Requesting Provider     Center, [facility] [address] [place],  [place] [address] T: [phone] F: [phone]  Primary Care Provider Center, [facility] [address] [place] [address] T: [phone] F: [phone]  Telehealth visit was conducted with Earl Murray, a 67 y.o. male via telephone.  History of Present Illness: Earl Murray is a 67 y.o presenting today via telephone visit to review his EMG results.  Unfortunately his symptoms are unchanged.  08/25/22 Earl Murray is a 67 y.o. 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  He states that this came on about 3 weeks ago without any inciting event.  About a week ago he suffered 2 consecutive falls and has now had increased thoracic back pain.  He states he has been falling more recently he feels as though his legs are giving out on him.   LOV 06/04/22 Earl Kin Sr. is status post extensive thoracolumbar reconstruction.  He is in the pain clinic at the [place].  He is on Suboxone.  He still has pain that comes and goes.  He is still better than he was prior to surgery.   General Review of Systems:  A ROS was performed including pertinent positive and negatives as documented.  All other systems are negative.   Prior to Admission medications   Medication Sig Start Date End Date Taking? Authorizing Provider  amLODipine (NORVASC) 10 MG tablet Take 10 mg by mouth daily.     [provider]  amoxicillin-clavulanate (AUGMENTIN) 875-125 MG tablet Take 1 tablet by mouth every 12 (twelve) hours. 12/26/21   Domenick Gong, MD  APIXABAN Everlene Balls) VTE STARTER PACK (10MG  AND 5MG ) Take as directed on package: start with two-5mg  tablets twice daily for 7 days. On day 8, switch to one-5mg  tablet  twice daily. 10/15/21   Leeroy Bock, MD  chlorthalidone (HYGROTON) 25 MG tablet Take 25 mg by mouth daily. 04/17/22   [provider]  hydrochlorothiazide (HYDRODIURIL) 25 MG tablet Take 1 tablet (25 mg total) by mouth daily. 08/15/18   Tommie Sams, DO  hydrOXYzine (ATARAX) 25 MG tablet Take 25 mg by mouth 2 (two) times daily as needed for anxiety. 05/15/22   [provider]  naproxen (NAPROSYN) 500 MG tablet Take 1 tablet (500 mg total) by mouth 2 (two) times daily with a meal. 10/06/22   Sharman Cheek, MD  PARoxetine (PAXIL) 20 MG tablet Take 20 mg by mouth daily. 05/15/22   [provider]  gabapentin (NEURONTIN) 300 MG capsule gabapentin 300 mg capsule  one tab bid-tid  03/01/19  [provider]  ipratropium (ATROVENT) 0.06 % nasal spray Place 2 sprays into both nostrils 4 (four) times daily as needed for rhinitis. 10/22/19 03/16/20  Verlee Monte, NP  levocetirizine (XYZAL) 5 MG tablet Take 1 tablet (5 mg total) by mouth every evening. 09/30/19 03/16/20  Tommie Sams, DO    DATA REVIEWED    Imaging Studies  5/14 L spine xrays IMPRESSION: Extensive hardware fixation seen from T10 through the upper sacrum. Grossly intact hardware.   Stable compression of L4.  Trace anterolisthesis of L3 on L4.   Mild degenerative changes.     Electronically Signed   By: Karen Kays M.D.   On: 10/06/2022 14:41  5/14/ T spine xrays IMPRESSION: Extensive hardware fixation seen from T10 through the upper sacrum. Grossly intact hardware.   Stable compression of L4.  Trace anterolisthesis of L3 on L4. Mild degenerative changes.  Electronically Signed   By: Karen Kays M.D.   On: 10/06/2022 14:41  09/11/22 MRI T spine IMPRESSION: Thinned appearance of the thoracic cord suggestive of atrophy. There is no cord signal abnormality. Cause for this finding is not identified.   Negative for central canal or foraminal stenosis.  Electronically  Signed   By: Drusilla Kanner M.D.   On: 09/05/2022 08:20  IMPRESSION  Earl Murray is a 67 y.o. male who I performed a telephone encounter today for evaluation and management of: Polyneuropathy  PLAN  Earl Murray is a 67 year old with a history of thoracolumbar fusion with diffuse weakness in his lower extremities and nondermatomal distribution numbness and tingling.  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  I recommended further workup with neurology for further evaluation and treatment of this.  I have placed a referral.  There is no indication for neurosurgical intervention at this time.  I will see him going forward on an as-needed basis.  He expressed understanding and was in agreement with this plan.  No orders of the defined types were placed in this encounter.   DISPOSITION  Follow up: In person appointment in [facility]  Susanne Borders, PA   TELEPHONE DOCUMENTATION   This visit was performed via telephone.  Patient location: home Provider location: office  I spent a total of 5 minutes non-face-to-face activities for this visit on the date of this encounter including review of current clinical condition and response to treatment discussion of EMG results, discussion of diagnosis, discussion of plan of care, and documentation.  The patient is aware of and accepts the limits of this telehealth visit.

## 2023-01-31 ENCOUNTER — Encounter: Payer: Self-pay | Admitting: Emergency Medicine

## 2023-01-31 ENCOUNTER — Emergency Department: Payer: 59

## 2023-01-31 ENCOUNTER — Emergency Department
Admission: EM | Admit: 2023-01-31 | Discharge: 2023-01-31 | Disposition: A | Discharge status: Home / Self Care | Payer: 59 | Attending: Emergency Medicine | Admitting: Emergency Medicine

## 2023-01-31 ENCOUNTER — Other Ambulatory Visit: Payer: Self-pay

## 2023-01-31 DIAGNOSIS — R109 Unspecified abdominal pain: Secondary | ICD-10-CM | POA: Diagnosis present

## 2023-01-31 DIAGNOSIS — R7401 Elevation of levels of liver transaminase levels: Secondary | ICD-10-CM | POA: Insufficient documentation

## 2023-01-31 DIAGNOSIS — R1033 Periumbilical pain: Secondary | ICD-10-CM | POA: Insufficient documentation

## 2023-01-31 DIAGNOSIS — D72829 Elevated white blood cell count, unspecified: Secondary | ICD-10-CM | POA: Insufficient documentation

## 2023-01-31 LAB — COMPREHENSIVE METABOLIC PANEL
ALT: 46 U/L — ABNORMAL HIGH (ref 0–44)
AST: 45 U/L — ABNORMAL HIGH (ref 15–41)
Albumin: 4 g/dL (ref 3.5–5.0)
Alkaline Phosphatase: 99 U/L (ref 38–126)
Anion gap: 6 (ref 5–15)
BUN: 11 mg/dL (ref 8–23)
CO2: 28 mmol/L (ref 22–32)
Calcium: 9.4 mg/dL (ref 8.9–10.3)
Chloride: 104 mmol/L (ref 98–111)
Creatinine, Ser: 1.18 mg/dL (ref 0.61–1.24)
GFR, Estimated: >60 mL/min (ref >60)
Glucose, Bld: 98 mg/dL (ref 70–99)
Potassium: 4.3 mmol/L (ref 3.5–5.1)
Sodium: 138 mmol/L (ref 135–145)
Total Bilirubin: 1.1 mg/dL (ref 0.3–1.2)
Total Protein: 7.5 g/dL (ref 6.5–8.1)

## 2023-01-31 LAB — CBC WITH DIFFERENTIAL/PLATELET
Abs Immature Granulocytes: 0.06 10*3/uL (ref 0.00–0.07)
Basophils Absolute: 0 10*3/uL (ref 0.0–0.1)
Basophils Relative: 0 %
Eosinophils Absolute: 0 10*3/uL (ref 0.0–0.5)
Eosinophils Relative: 0 %
HCT: 44.6 % (ref 39.0–52.0)
Hemoglobin: 14.7 g/dL (ref 13.0–17.0)
Immature Granulocytes: 0 %
Lymphocytes Relative: 6 %
Lymphs Abs: 0.8 10*3/uL (ref 0.7–4.0)
MCH: 29.9 pg (ref 26.0–34.0)
MCHC: 33 g/dL (ref 30.0–36.0)
MCV: 90.7 fL (ref 80.0–100.0)
Monocytes Absolute: 0.9 10*3/uL (ref 0.1–1.0)
Monocytes Relative: 6 %
Neutro Abs: 12.8 10*3/uL — ABNORMAL HIGH (ref 1.7–7.7)
Neutrophils Relative %: 88 %
Platelets: 231 10*3/uL (ref 150–400)
RBC: 4.92 MIL/uL (ref 4.22–5.81)
RDW: 15.6 % — ABNORMAL HIGH (ref 11.5–15.5)
WBC: 14.7 10*3/uL — ABNORMAL HIGH (ref 4.0–10.5)
nRBC: 0 % (ref 0.0–0.2)

## 2023-01-31 LAB — URINALYSIS, W/ REFLEX TO CULTURE (INFECTION SUSPECTED)
Bacteria, UA: NONE SEEN
Bilirubin Urine: NEGATIVE
Glucose, UA: NEGATIVE mg/dL
Hgb urine dipstick: NEGATIVE
Ketones, ur: NEGATIVE mg/dL
Leukocytes,Ua: NEGATIVE
Nitrite: NEGATIVE
Protein, ur: NEGATIVE mg/dL
Specific Gravity, Urine: 1.019 (ref 1.005–1.030)
pH: 5 (ref 5.0–8.0)

## 2023-01-31 LAB — LIPASE, BLOOD: Lipase: 32 U/L (ref 11–51)

## 2023-01-31 MED ORDER — HYDROMORPHONE HCL 1 MG/ML IJ SOLN
0.5000 mg | Freq: Once | INTRAMUSCULAR | Status: AC
  Administered 2023-01-31: 0.5 mg via INTRAVENOUS
  Filled 2023-01-31: qty 0.5

## 2023-01-31 MED ORDER — IOHEXOL 300 MG/ML  SOLN
100.0000 mL | Freq: Once | INTRAMUSCULAR | Status: AC | PRN
  Administered 2023-01-31: 100 mL via INTRAVENOUS

## 2023-01-31 MED ORDER — DIAZEPAM 10 MG PO TABS: 10.0000 mg | ORAL_TABLET | Freq: Four times a day (QID) | ORAL | 0 refills | Status: AC | PRN

## 2023-01-31 MED ORDER — KETOROLAC TROMETHAMINE 15 MG/ML IJ SOLN
15.0000 mg | Freq: Once | INTRAMUSCULAR | Status: AC
  Administered 2023-01-31: 15 mg via INTRAVENOUS
  Filled 2023-01-31: qty 1

## 2023-01-31 MED ORDER — DIAZEPAM 5 MG PO TABS
10.0000 mg | ORAL_TABLET | Freq: Once | ORAL | Status: AC
  Administered 2023-01-31: 10 mg via ORAL
  Filled 2023-01-31: qty 2

## 2023-01-31 MED ORDER — SODIUM CHLORIDE 0.9 % IV BOLUS
1000.0000 mL | Freq: Once | INTRAVENOUS | Status: AC
  Administered 2023-01-31: 1000 mL via INTRAVENOUS

## 2023-01-31 NOTE — ED Provider Triage Note (Signed)
Emergency Medicine Provider Triage Evaluation Note  Earl Murray , a 67 y.o. male  was evaluated in triage.  Pt complains of epigastric pain, no n/v/d, thinks black stool. No ASA, ibuprofen, or anticoagulation. No difference with food. No ETOH, tobacco use. Feels better when he lays down.  Review of Systems  Positive: Abd pain, black stool Negative: N/v/d  Physical Exam  BP (!) 140/84 (BP Location: Left Arm)   Pulse 86   Temp 98.5 F (36.9 C) (Oral)   Resp 17   SpO2 96%  Gen:   Awake, no distress   Resp:  Normal effort MSK:   Moves extremities without difficulty  Other:    Medical Decision Making  Medically screening exam initiated at 1:46 PM.  Appropriate orders placed.  Earl Murray was informed that the remainder of the evaluation will be completed by another provider, this initial triage assessment does not replace that evaluation, and the importance of remaining in the ED until their evaluation is complete.     Jackelyn Hoehn, PA-C 01/31/23 1349

## 2023-01-31 NOTE — ED Provider Notes (Signed)
[facility] Provider Note    Event Date/Time   First MD Initiated Contact with Patient 01/31/23 1643     (approximate)   History   Abdominal Pain   HPI  Earl Murray is a 67 year old male presenting to the emergency department for evaluation of abdominal pain.  Yesterday, patient reports he was mowing his lawn when he noticed some pain over his right abdomen.  It initially felt like a muscle strain, but reports he has had worsening sharp abdominal pain since that time.  No nausea, vomiting, diarrhea.  Having regular bowel movements.  Does have a history of multiple prior hernia surgeries.  Does note that during his last bowel movement when he wiped there was a small amount of blood on the toilet paper, no blood in the bowl, no other bleeding noted, reports this is occurred multiple times previously.     Physical Exam   Triage Vital Signs: ED Triage Vitals  Encounter Vitals Group     BP 01/31/23 1344 (!) 140/84     Systolic BP Percentile --      Diastolic BP Percentile --      Pulse Rate 01/31/23 1344 86     Resp 01/31/23 1344 17     Temp 01/31/23 1344 98.5 F (36.9 C)     Temp Source 01/31/23 1344 Oral     SpO2 01/31/23 1344 96 %     Weight 01/31/23 1351 200 lb (90.7 kg)     Height 01/31/23 1351 6\' 5"  (1.956 m)     Head Circumference --      Peak Flow --      Pain Score 01/31/23 1351 9     Pain Loc --      Pain Education --      Exclude from Growth Chart --     Most recent vital signs: Vitals:   01/31/23 1830 01/31/23 1900  BP: (!) 159/82 (!) 139/91  Pulse: 83 80  Resp:    Temp:    SpO2: 98% 97%     General: Awake, interactive  CV:  Regular rate, good peripheral perfusion.  Resp:  Lungs clear, unlabored respirations.  Abd:  Soft, nondistended, tender to palpation throughout the abdomen most notably over the right mid abdomen and periumbilical region.  Negative Murphy sign.  Small amount of mucousy brown stool on rectal exam, Hemoccult  negative. Neuro:  Symmetric facial movement, fluid speech   ED Results / Procedures / Treatments   Labs (all labs ordered are listed, but only abnormal results are displayed) Labs Reviewed  COMPREHENSIVE METABOLIC PANEL - Abnormal; Notable for the following components:      Result Value   AST 45 (*)    ALT 46 (*)    All other components within normal limits  CBC WITH DIFFERENTIAL/PLATELET - Abnormal; Notable for the following components:   WBC 14.7 (*)    RDW 15.6 (*)    Neutro Abs 12.8 (*)    All other components within normal limits  URINALYSIS, W/ REFLEX TO CULTURE (INFECTION SUSPECTED) - Abnormal; Notable for the following components:   Color, Urine YELLOW (*)    APPearance CLEAR (*)    All other components within normal limits  LIPASE, BLOOD     EKG EKG independently reviewed interpreted by myself (ER attending) demonstrates:    RADIOLOGY Imaging independently reviewed and interpreted by myself demonstrates:  CT abdomen pelvis without evidence of obstruction or other acute intra-abdominal process  PROCEDURES:  Critical  Care performed: No  Procedures   MEDICATIONS ORDERED IN ED: Medications  sodium chloride 0.9 % bolus 1,000 mL (1,000 mLs Intravenous New Bag/Given 01/31/23 1727)  HYDROmorphone (DILAUDID) injection 0.5 mg (0.5 mg Intravenous Given 01/31/23 1727)  iohexol (OMNIPAQUE) 300 MG/ML solution 100 mL (100 mLs Intravenous Contrast Given 01/31/23 1732)  ketorolac (TORADOL) 15 MG/ML injection 15 mg (15 mg Intravenous Given 01/31/23 1833)  diazepam (VALIUM) tablet 10 mg (10 mg Oral Given 01/31/23 1833)     IMPRESSION / MDM / ASSESSMENT AND PLAN / ED COURSE  I reviewed the triage vital signs and the nursing notes.  Differential diagnosis includes, but is not limited to, appendicitis, diverticulitis, other acute intra-abdominal process, musculoskeletal pain  Patient's presentation is most consistent with acute presentation with potential threat to life or bodily  function.  67 year old male presenting to the emergency department for evaluation of abdominal pain.  Lab work with mild leukocytosis WC of 14.7, mild transaminitis without recent prior for comparison.  UA without evidence of infection.  CT abdomen pelvis without acute findings.  With clinical history, suspect possible musculoskeletal strain causing patient's problems.  He initially received Dilaudid, reported no significant improvement in his pain.  He was then given Valium and Toradol and reports significant improvement with this.  Reports he has been taking naproxen and Flexeril at home with limited benefit.  Discussed increased risk with his age with benzos compared to Flexeril, but given significant improvement in his symptoms he would like to proceed with a short course of this which I do think is reasonable.  Strict return precautions were provided.  Patient was discharged in stable condition.     FINAL CLINICAL IMPRESSION(S) / ED DIAGNOSES   Final diagnoses:  Acute abdominal pain     Rx / DC Orders   ED Discharge Orders          Ordered    diazepam (VALIUM) 10 MG tablet  Every 6 hours PRN        01/31/23 1939             Note:  This document was prepared using Dragon voice recognition software and may include unintentional dictation errors.   Trinna Post, MD 01/31/23 [phone]

## 2023-01-31 NOTE — Discharge Instructions (Addendum)
You were seen in the ER for your abdominal pain.  We fortunately did not find an emergency cause for this.  I suspect that your pain could be related to a muscle strain.  I sent a prescription for Valium to your pharmacy.  Do not take this with other muscle relaxers, and do not drive or operate machinery when taking this.  Follow-up your primary care doctor for further evaluation.  Return to the ER for new or worsening symptoms.

## 2023-01-31 NOTE — ED Triage Notes (Signed)
Pt in via [facility] from home, reports ongoing sharp abdominal pain since Thursday, worsening since last night; denies any N/V/D.  Reports last BM today; reports noticing blood in BM today, described as "black."  NAD noted at this time.

## 2023-01-31 NOTE — ED Triage Notes (Signed)
First Nurse Note;  Pt via [facility] from home. Pt c/o abd pain, stabbing, since Thursday. Pt has hx of 4 hernia. Also reports blood in his stool. 98% on RA 93 HR  152/92 BP with hx

## 2023-02-01 NOTE — Group Note (Deleted)

## 2023-02-16 DIAGNOSIS — Z86711 Personal history of pulmonary embolism: Secondary | ICD-10-CM | POA: Diagnosis present

## 2023-02-16 DIAGNOSIS — Z7901 Long term (current) use of anticoagulants: Secondary | ICD-10-CM

## 2023-08-18 ENCOUNTER — Encounter: Payer: Self-pay | Admitting: Intensive Care

## 2023-08-18 ENCOUNTER — Emergency Department
Admission: EM | Admit: 2023-08-18 | Discharge: 2023-08-18 | Disposition: A | Discharge status: Home / Self Care | Attending: Emergency Medicine | Admitting: Emergency Medicine

## 2023-08-18 ENCOUNTER — Emergency Department

## 2023-08-18 ENCOUNTER — Other Ambulatory Visit: Payer: Self-pay

## 2023-08-18 DIAGNOSIS — G8929 Other chronic pain: Secondary | ICD-10-CM | POA: Insufficient documentation

## 2023-08-18 DIAGNOSIS — M542 Cervicalgia: Secondary | ICD-10-CM | POA: Insufficient documentation

## 2023-08-18 DIAGNOSIS — Z8551 Personal history of malignant neoplasm of bladder: Secondary | ICD-10-CM | POA: Diagnosis not present

## 2023-08-18 DIAGNOSIS — N189 Chronic kidney disease, unspecified: Secondary | ICD-10-CM | POA: Diagnosis not present

## 2023-08-18 DIAGNOSIS — I129 Hypertensive chronic kidney disease with stage 1 through stage 4 chronic kidney disease, or unspecified chronic kidney disease: Secondary | ICD-10-CM | POA: Diagnosis not present

## 2023-08-18 DIAGNOSIS — M5440 Lumbago with sciatica, unspecified side: Secondary | ICD-10-CM | POA: Diagnosis not present

## 2023-08-18 DIAGNOSIS — M545 Low back pain, unspecified: Secondary | ICD-10-CM | POA: Diagnosis present

## 2023-08-18 DIAGNOSIS — W19XXXA Unspecified fall, initial encounter: Secondary | ICD-10-CM

## 2023-08-18 MED ORDER — HYDROMORPHONE HCL 1 MG/ML IJ SOLN
1.0000 mg | Freq: Once | INTRAMUSCULAR | Status: AC
  Administered 2023-08-18: 1 mg via INTRAMUSCULAR
  Filled 2023-08-18: qty 1

## 2023-08-18 NOTE — ED Notes (Addendum)
 This RN attempted to call pt emergency contact identified on chart r/t fall, Kim- no answer.

## 2023-08-18 NOTE — ED Notes (Signed)
 EDP Dr Cyril Loosen alerted this RN that pt was laying in the floor, This RN and MD to assist pt back in bed without difficulty. This was an unwitnessed event. Pt reports he was sleeping and rolled out of bed.

## 2023-08-18 NOTE — ED Triage Notes (Signed)
 First Nurse Note: Patient to ED via [facility] from home for a mechanical fall. Multiple falls over the last week. Hx of back pain. C/o neck and back pain. Deneis hitting head. No LOC does take blood thinners. VS WNL

## 2023-08-18 NOTE — ED Provider Notes (Signed)
 [facility] Provider Note    Event Date/Time   First MD Initiated Contact with Patient 08/18/23 1800     (approximate)   History   Fall   HPI  Earl Murray is a 68 y.o. male history of chronic pain, chronic back problems, hypertension, bladder cancer, hep C, DVT, opioid dependence that is currently in remission, cocaine dependence currently in remission, CKD presents emergency department complaining of multiple falls over the past week with increased lower back pain and some neck pain.  Denies any new numbness tingling etc.      Physical Exam   Triage Vital Signs: ED Triage Vitals  Encounter Vitals Group     BP 08/18/23 1710 (!) 123/102     Systolic BP Percentile --      Diastolic BP Percentile --      Pulse Rate 08/18/23 1710 (!) 110     Resp 08/18/23 1710 16     Temp 08/18/23 1710 99.1 F (37.3 C)     Temp Source 08/18/23 1710 Oral     SpO2 08/18/23 1710 95 %     Weight 08/18/23 1711 180 lb (81.6 kg)     Height 08/18/23 1711 6\' 5"  (1.956 m)     Head Circumference --      Peak Flow --      Pain Score 08/18/23 1711 10     Pain Loc --      Pain Education --      Exclude from Growth Chart --     Most recent vital signs: Vitals:   08/18/23 1710  BP: (!) 123/102  Pulse: (!) 110  Resp: 16  Temp: 99.1 F (37.3 C)  SpO2: 95%     General: Awake, no distress.   CV:  Good peripheral perfusion. regular rate and  rhythm Resp:  Normal effort.  Abd:  No distention.   Other:  C-spine mildly tender to palpation, patient is in a c-collar at this time.  Lumbar spine mildly tender, 5/5 strength lower extremities, grips equal bilaterally   ED Results / Procedures / Treatments   Labs (all labs ordered are listed, but only abnormal results are displayed) Labs Reviewed - No data to display   EKG     RADIOLOGY  Ct cspine, xray lumbar spine   PROCEDURES:   Procedures Chief Complaint  Patient presents with   Fall       MEDICATIONS ORDERED IN ED: Medications  HYDROmorphone (DILAUDID) injection 1 mg (1 mg Intramuscular Given 08/18/23 1821)     IMPRESSION / MDM / ASSESSMENT AND PLAN / ED COURSE  I reviewed the triage vital signs and the nursing notes.                              Differential diagnosis includes, but is not limited to, fracture, strain, contusion  Patient's presentation is most consistent with acute complicated illness / injury requiring diagnostic workup.   CT of cervical spine, x-ray lumbar spine  X-ray lumbar spine independently reviewed interpreted by me as being negative for any acute abnormality, radiologist comments multiple postsurgical changes with no acute abnormality   CT of the cervical spine independently reviewed interpreted by me as being negative for acute abnormality  I did explain the findings to the patient.  He is follow-up pain clinic.  He can call his regular doctor as needed.  Return if worsening.  Will have nursing staff check  his blood pressure prior to discharge if stable will continue to go home   FINAL CLINICAL IMPRESSION(S) / ED DIAGNOSES   Final diagnoses:  Fall, initial encounter  Chronic low back pain with sciatica, sciatica laterality unspecified, unspecified back pain laterality     Rx / DC Orders   ED Discharge Orders     None        Note:  This document was prepared using Dragon voice recognition software and may include unintentional dictation errors.    Faythe Ghee, PA-C 08/18/23 Barbette Reichmann    Jene Every, MD 08/18/23 [phone]

## 2023-08-19 ENCOUNTER — Emergency Department

## 2023-08-19 ENCOUNTER — Encounter: Payer: Self-pay | Admitting: *Deleted

## 2023-08-19 ENCOUNTER — Other Ambulatory Visit: Payer: Self-pay

## 2023-08-19 ENCOUNTER — Observation Stay
Admission: EM | Admit: 2023-08-19 | Discharge: 2023-08-20 | Disposition: A | Discharge status: Home / Self Care | Attending: Internal Medicine | Admitting: Internal Medicine

## 2023-08-19 DIAGNOSIS — R296 Repeated falls: Secondary | ICD-10-CM

## 2023-08-19 DIAGNOSIS — Z8551 Personal history of malignant neoplasm of bladder: Secondary | ICD-10-CM | POA: Insufficient documentation

## 2023-08-19 DIAGNOSIS — R7989 Other specified abnormal findings of blood chemistry: Secondary | ICD-10-CM | POA: Diagnosis not present

## 2023-08-19 DIAGNOSIS — R072 Precordial pain: Secondary | ICD-10-CM | POA: Diagnosis present

## 2023-08-19 DIAGNOSIS — Z9889 Other specified postprocedural states: Secondary | ICD-10-CM | POA: Diagnosis not present

## 2023-08-19 DIAGNOSIS — Z86711 Personal history of pulmonary embolism: Secondary | ICD-10-CM | POA: Diagnosis present

## 2023-08-19 DIAGNOSIS — F32A Depression, unspecified: Secondary | ICD-10-CM | POA: Diagnosis not present

## 2023-08-19 DIAGNOSIS — Z79899 Other long term (current) drug therapy: Secondary | ICD-10-CM | POA: Insufficient documentation

## 2023-08-19 DIAGNOSIS — Z86718 Personal history of other venous thrombosis and embolism: Secondary | ICD-10-CM | POA: Insufficient documentation

## 2023-08-19 DIAGNOSIS — R079 Chest pain, unspecified: Principal | ICD-10-CM

## 2023-08-19 DIAGNOSIS — I129 Hypertensive chronic kidney disease with stage 1 through stage 4 chronic kidney disease, or unspecified chronic kidney disease: Secondary | ICD-10-CM | POA: Insufficient documentation

## 2023-08-19 DIAGNOSIS — Z9049 Acquired absence of other specified parts of digestive tract: Secondary | ICD-10-CM | POA: Diagnosis not present

## 2023-08-19 DIAGNOSIS — M545 Low back pain, unspecified: Secondary | ICD-10-CM | POA: Diagnosis not present

## 2023-08-19 DIAGNOSIS — Z7901 Long term (current) use of anticoagulants: Secondary | ICD-10-CM

## 2023-08-19 DIAGNOSIS — I10 Essential (primary) hypertension: Secondary | ICD-10-CM | POA: Diagnosis present

## 2023-08-19 DIAGNOSIS — Z8619 Personal history of other infectious and parasitic diseases: Secondary | ICD-10-CM | POA: Diagnosis not present

## 2023-08-19 DIAGNOSIS — N189 Chronic kidney disease, unspecified: Secondary | ICD-10-CM | POA: Insufficient documentation

## 2023-08-19 DIAGNOSIS — Z87891 Personal history of nicotine dependence: Secondary | ICD-10-CM | POA: Insufficient documentation

## 2023-08-19 DIAGNOSIS — Z981 Arthrodesis status: Secondary | ICD-10-CM

## 2023-08-19 DIAGNOSIS — G629 Polyneuropathy, unspecified: Secondary | ICD-10-CM | POA: Diagnosis not present

## 2023-08-19 DIAGNOSIS — E785 Hyperlipidemia, unspecified: Secondary | ICD-10-CM | POA: Diagnosis not present

## 2023-08-19 DIAGNOSIS — F1121 Opioid dependence, in remission: Secondary | ICD-10-CM | POA: Diagnosis not present

## 2023-08-19 DIAGNOSIS — G8929 Other chronic pain: Secondary | ICD-10-CM | POA: Diagnosis present

## 2023-08-19 DIAGNOSIS — Z859 Personal history of malignant neoplasm, unspecified: Secondary | ICD-10-CM | POA: Insufficient documentation

## 2023-08-19 LAB — COMPREHENSIVE METABOLIC PANEL WITH GFR
ALT: 169 U/L — ABNORMAL HIGH (ref 0–44)
AST: 153 U/L — ABNORMAL HIGH (ref 15–41)
Albumin: 3.9 g/dL (ref 3.5–5.0)
Alkaline Phosphatase: 325 U/L — ABNORMAL HIGH (ref 38–126)
Anion gap: 10 (ref 5–15)
BUN: 18 mg/dL (ref 8–23)
CO2: 26 mmol/L (ref 22–32)
Calcium: 9.6 mg/dL (ref 8.9–10.3)
Chloride: 103 mmol/L (ref 98–111)
Creatinine, Ser: 1.12 mg/dL (ref 0.61–1.24)
GFR, Estimated: >60 mL/min (ref >60)
Glucose, Bld: 104 mg/dL — ABNORMAL HIGH (ref 70–99)
Potassium: 3.5 mmol/L (ref 3.5–5.1)
Sodium: 139 mmol/L (ref 135–145)
Total Bilirubin: 2.1 mg/dL — ABNORMAL HIGH (ref 0.0–1.2)
Total Protein: 8 g/dL (ref 6.5–8.1)

## 2023-08-19 LAB — CBC WITH DIFFERENTIAL/PLATELET
Abs Immature Granulocytes: 0.09 10*3/uL — ABNORMAL HIGH (ref 0.00–0.07)
Basophils Absolute: 0.1 10*3/uL (ref 0.0–0.1)
Basophils Relative: 0 %
Eosinophils Absolute: 0.1 10*3/uL (ref 0.0–0.5)
Eosinophils Relative: 1 %
HCT: 42.2 % (ref 39.0–52.0)
Hemoglobin: 14.6 g/dL (ref 13.0–17.0)
Immature Granulocytes: 1 %
Lymphocytes Relative: 14 %
Lymphs Abs: 2.4 10*3/uL (ref 0.7–4.0)
MCH: 30.3 pg (ref 26.0–34.0)
MCHC: 34.6 g/dL (ref 30.0–36.0)
MCV: 87.6 fL (ref 80.0–100.0)
Monocytes Absolute: 1.2 10*3/uL — ABNORMAL HIGH (ref 0.1–1.0)
Monocytes Relative: 7 %
Neutro Abs: 13 10*3/uL — ABNORMAL HIGH (ref 1.7–7.7)
Neutrophils Relative %: 77 %
Platelets: 300 10*3/uL (ref 150–400)
RBC: 4.82 MIL/uL (ref 4.22–5.81)
RDW: 15.7 % — ABNORMAL HIGH (ref 11.5–15.5)
WBC: 16.9 10*3/uL — ABNORMAL HIGH (ref 4.0–10.5)
nRBC: 0 % (ref 0.0–0.2)

## 2023-08-19 LAB — TROPONIN I (HIGH SENSITIVITY): Troponin I (High Sensitivity): 14 ng/L (ref <18)

## 2023-08-19 LAB — LIPASE, BLOOD: Lipase: 38 U/L (ref 11–51)

## 2023-08-19 MED ORDER — APIXABAN 5 MG PO TABS
5.0000 mg | ORAL_TABLET | Freq: Two times a day (BID) | ORAL | Status: DC
  Administered 2023-08-19 – 2023-08-20 (×2): 5 mg via ORAL
  Filled 2023-08-19 (×2): qty 1

## 2023-08-19 MED ORDER — PAROXETINE HCL 20 MG PO TABS
20.0000 mg | ORAL_TABLET | Freq: Every day | ORAL | Status: DC
  Administered 2023-08-20: 20 mg via ORAL
  Filled 2023-08-19: qty 1

## 2023-08-19 MED ORDER — ASPIRIN 81 MG PO TBEC
81.0000 mg | DELAYED_RELEASE_TABLET | Freq: Every day | ORAL | Status: DC
  Administered 2023-08-20: 81 mg via ORAL
  Filled 2023-08-19: qty 1

## 2023-08-19 MED ORDER — AMLODIPINE BESYLATE 5 MG PO TABS: 10.0000 mg | ORAL_TABLET | Freq: Every day | ORAL | Status: DC

## 2023-08-19 MED ORDER — NITROGLYCERIN 0.4 MG SL SUBL: 0.4000 mg | SUBLINGUAL_TABLET | SUBLINGUAL | Status: DC | PRN

## 2023-08-19 MED ORDER — IOHEXOL 350 MG/ML SOLN
100.0000 mL | Freq: Once | INTRAVENOUS | Status: AC | PRN
  Administered 2023-08-19: 100 mL via INTRAVENOUS

## 2023-08-19 MED ORDER — KETOROLAC TROMETHAMINE 15 MG/ML IJ SOLN
15.0000 mg | Freq: Once | INTRAMUSCULAR | Status: DC
  Filled 2023-08-19: qty 1

## 2023-08-19 MED ORDER — ASPIRIN 81 MG PO CHEW
324.0000 mg | CHEWABLE_TABLET | Freq: Once | ORAL | Status: DC
  Filled 2023-08-19: qty 4

## 2023-08-19 MED ORDER — SODIUM CHLORIDE 0.9 % IV BOLUS
1000.0000 mL | Freq: Once | INTRAVENOUS | Status: AC
  Administered 2023-08-19: 1000 mL via INTRAVENOUS

## 2023-08-19 MED ORDER — NITROGLYCERIN 0.4 MG SL SUBL
0.4000 mg | SUBLINGUAL_TABLET | SUBLINGUAL | Status: DC | PRN
  Administered 2023-08-19: 0.4 mg via SUBLINGUAL
  Filled 2023-08-19: qty 1

## 2023-08-19 MED ORDER — HYDROMORPHONE HCL 1 MG/ML IJ SOLN
1.0000 mg | Freq: Once | INTRAMUSCULAR | Status: AC
  Administered 2023-08-19: 1 mg via INTRAVENOUS
  Filled 2023-08-19: qty 1

## 2023-08-19 MED ORDER — ONDANSETRON HCL 4 MG/2ML IJ SOLN: 4.0000 mg | Freq: Four times a day (QID) | INTRAMUSCULAR | Status: DC | PRN

## 2023-08-19 MED ORDER — ACETAMINOPHEN 325 MG PO TABS
650.0000 mg | ORAL_TABLET | ORAL | Status: DC | PRN
  Administered 2023-08-19: 650 mg via ORAL
  Filled 2023-08-19: qty 2

## 2023-08-19 NOTE — Assessment & Plan Note (Addendum)
 Chest pain with typical and atypical features, risk factors include hypertension and hyperlipidemia CTA chest was negative for PE First troponin 14 and EKG nonacute Continue to trend troponin Will consult cardiology and keep n.p.o. in case of stress test

## 2023-08-19 NOTE — ED Notes (Signed)
 Patient to CT.

## 2023-08-19 NOTE — Assessment & Plan Note (Signed)
 Not on relevant meds

## 2023-08-19 NOTE — ED Triage Notes (Signed)
 Pt arrives from home due to chest pain which began yesterday.  No cardiac hx.  Pt was transported here by [facility] for back pain yesterday.  Pt states that this pain began at 4pm yesterday but has gotten worse today.  EKG for ems wnl, 324mg  asa pta.  VSS.

## 2023-08-19 NOTE — Assessment & Plan Note (Signed)
 Long-term anticoagulant Continue Eliquis

## 2023-08-19 NOTE — Assessment & Plan Note (Signed)
 History of spinal fusion Patient complains of 10 out of 10 back pain Patient can use Salonpas 4% over-the-counter.  Prescribed a few pills of oxycodone.  Patient states he is going to follow-up with pain management.  Can consider going back on Suboxone.

## 2023-08-19 NOTE — ED Notes (Signed)
 Patient stated Toradol makes him itch when notified that was the medication ordered for pain. Patient stating he wants Dilaudid. MD [facility] notified.

## 2023-08-19 NOTE — ED Notes (Signed)
 Patient asking for more Dilaudid. MD [facility] notified.

## 2023-08-19 NOTE — Assessment & Plan Note (Signed)
 Continue his usual antihypertensive medication

## 2023-08-19 NOTE — H&P (Signed)
 History and Physical    Patient: Earl Murray ZOX:[id number] DOB: 12-09-1955 DOA: 08/19/2023 DOS: the patient was seen and examined on 08/19/2023 PCP: [facility]  Patient coming from: Home  Chief Complaint:  Chief Complaint  Patient presents with   Chest Pain    HPI: Kelton Bultman is a 68 y.o. male with medical history significant for HTN, HLD, chronic back pain s/p lumbar fusion, remote history of substance abuse on Suboxone, depression, hepatitis C, pulmonary embolism requiring thrombectomy 09/2021 and prior provoked DVT, on Eliquis with which he is mostly compliant, neuropathy with history of falls, followed by neurology, being admitted with high risk chest pain.  He states the pain started around noon while he was mowing grass and since onset has been waxing and waning.  Is nonradiating and worsened by deep breathing.  Has associated lightheadedness shortness of breath and diaphoresis no nausea or vomiting.  Denies cough fever or chills.  Denies lower extremity pain or swelling. Of note, patient was seen the day prior in the emergency room for a fall.  He complained of the time of multiple falls in the past week and increased lower back pain and neck pain.  He had a CT C-spine and x-ray lumbar spine that was nontraumatic and he was treated with hydromorphone and discharged to follow-up with the pain clinic. ED course andData review: BP 163/90 with otherwise normal vitals. Labs notable for first troponin of 14.  WBC 17,000.  LFT elevation with AST 153, ALT 169 and alk phos 325, total bilirubin 2.1.  Lipase normal at 38 EKG, personally viewed and interpreted showing sinus rhythm at 86 with an occasional PVC CTA chest negative for PE CT abdomen and pelvis showed evidence of prior cholecystectomy with stable moderate severity pneumobilia also shows multiple stable chronic degenerative postoperative changes throughout the thoracolumbar spine, and stable simple renal cysts-no follow-up  recommended.  Aortic atherosclerosis.  Patient was treated with sublingual nitroglycerin, hydromorphone, Toradol he was also given an NS bolus  Hospitalist consulted for admission for chest pain rule out   Review of Systems: As mentioned in the history of present illness. All other systems reviewed and are negative.  Past Medical History:  Diagnosis Date   Bladder cancer (HCC)    Choledocholithiasis    Chronic kidney disease    Cocaine dependence in remission (HCC)    Concussion    Depression    DVT (deep venous thrombosis) (HCC)    GERD (gastroesophageal reflux disease)    Hepatitis C    History of cancer    Hyperlipidemia    Hypertension    Migraine    Neck injury    Opioid dependence in remission (HCC)    Pancreatitis    Prediabetes    Scoliosis    Sleep apnea    Past Surgical History:  Procedure Laterality Date   BACK SURGERY     CHOLECYSTECTOMY     ELBOW SURGERY     ERCP N/A 10/27/2019   Procedure: ENDOSCOPIC RETROGRADE CHOLANGIOPANCREATOGRAPHY (ERCP);  Surgeon: Midge Minium, MD;  Location: [facility] ENDOSCOPY;  Service: Endoscopy;  Laterality: N/A;   FOOT SURGERY     HERNIA REPAIR     4 hernia repairs   PULMONARY THROMBECTOMY N/A 10/14/2021   Procedure: PULMONARY THROMBECTOMY;  Surgeon: Renford Dills, MD;  Location: [facility] INVASIVE CV LAB;  Service: Cardiovascular;  Laterality: N/A;   Social History:  reports that he quit smoking about 5 years ago. His smoking use included cigarettes. He has  never used smokeless tobacco. He reports current alcohol use. He reports that he does not currently use drugs.  Allergies  Allergen Reactions   Toradol [Ketorolac Tromethamine] Itching    Per pt it makes him itch.    Motrin [Ibuprofen] Itching and Other (See Comments)   Tramadol Rash   Tylenol With Codeine #3 [Acetaminophen-Codeine] Itching    Family History  Problem Relation Age of Onset   Hypertension Mother    Heart disease Mother    Colon cancer Father     Prior  to Admission medications   Medication Sig Start Date End Date Taking? Authorizing Provider  amLODipine (NORVASC) 10 MG tablet Take 10 mg by mouth daily.     [provider]  amoxicillin-clavulanate (AUGMENTIN) 875-125 MG tablet Take 1 tablet by mouth every 12 (twelve) hours. 12/26/21   Domenick Gong, MD  APIXABAN Everlene Balls) VTE STARTER PACK (10MG  AND 5MG ) Take as directed on package: start with two-5mg  tablets twice daily for 7 days. On day 8, switch to one-5mg  tablet twice daily. 10/15/21   Leeroy Bock, MD  chlorthalidone (HYGROTON) 25 MG tablet Take 25 mg by mouth daily. 04/17/22   [provider]  hydrochlorothiazide (HYDRODIURIL) 25 MG tablet Take 1 tablet (25 mg total) by mouth daily. 08/15/18   Tommie Sams, DO  hydrOXYzine (ATARAX) 25 MG tablet Take 25 mg by mouth 2 (two) times daily as needed for anxiety. 05/15/22   [provider]  naproxen (NAPROSYN) 500 MG tablet Take 1 tablet (500 mg total) by mouth 2 (two) times daily with a meal. 10/06/22   Sharman Cheek, MD  PARoxetine (PAXIL) 20 MG tablet Take 20 mg by mouth daily. 05/15/22   [provider]  gabapentin (NEURONTIN) 300 MG capsule gabapentin 300 mg capsule  one tab bid-tid  03/01/19  [provider]  ipratropium (ATROVENT) 0.06 % nasal spray Place 2 sprays into both nostrils 4 (four) times daily as needed for rhinitis. 10/22/19 03/16/20  Verlee Monte, NP  levocetirizine (XYZAL) 5 MG tablet Take 1 tablet (5 mg total) by mouth every evening. 09/30/19 03/16/20  Tommie Sams, DO    Physical Exam: Vitals:   08/19/23 1953 08/19/23 2030 08/19/23 2130 08/19/23 2200  BP:  132/71 129/77 127/76  Pulse:  74 70 70  Resp:  15 15 11   Temp:      TempSrc:      SpO2:  99% 97% 100%  Weight: 90.7 kg     Height: 6\' 5"  (1.956 m)      Physical Exam Vitals and nursing note reviewed.  Constitutional:      General: He is not in acute distress. HENT:     Head: Normocephalic and atraumatic.   Cardiovascular:     Rate and Rhythm: Normal rate and regular rhythm.     Heart sounds: Normal heart sounds.  Pulmonary:     Effort: Pulmonary effort is normal.     Breath sounds: Normal breath sounds.  Abdominal:     Palpations: Abdomen is soft.     Tenderness: There is no abdominal tenderness.  Neurological:     Mental Status: Mental status is at baseline.     Labs on Admission: I have personally reviewed following labs and imaging studies  CBC: Recent Labs  Lab 08/19/23 1936  WBC 16.9*  NEUTROABS 13.0*  HGB 14.6  HCT 42.2  MCV 87.6  PLT 300   Basic Metabolic Panel: Recent Labs  Lab 08/19/23 1936  NA 139  K 3.5  CL 103  CO2 26  GLUCOSE 104*  BUN 18  CREATININE 1.12  CALCIUM 9.6   GFR: Estimated Creatinine Clearance: 78.4 mL/min (by C-G formula based on SCr of 1.12 mg/dL). Liver Function Tests: Recent Labs  Lab 08/19/23 1936  AST 153*  ALT 169*  ALKPHOS 325*  BILITOT 2.1*  PROT 8.0  ALBUMIN 3.9   Recent Labs  Lab 08/19/23 1936  LIPASE 38   No results for input(s): "AMMONIA" in the last 168 hours. Coagulation Profile: No results for input(s): "INR", "PROTIME" in the last 168 hours. Cardiac Enzymes: No results for input(s): "CKTOTAL", "CKMB", "CKMBINDEX", "TROPONINI" in the last 168 hours. BNP (last 3 results) No results for input(s): "PROBNP" in the last 8760 hours. HbA1C: No results for input(s): "HGBA1C" in the last 72 hours. CBG: No results for input(s): "GLUCAP" in the last 168 hours. Lipid Profile: No results for input(s): "CHOL", "HDL", "LDLCALC", "TRIG", "CHOLHDL", "LDLDIRECT" in the last 72 hours. Thyroid Function Tests: No results for input(s): "TSH", "T4TOTAL", "FREET4", "T3FREE", "THYROIDAB" in the last 72 hours. Anemia Panel: No results for input(s): "VITAMINB12", "FOLATE", "FERRITIN", "TIBC", "IRON", "RETICCTPCT" in the last 72 hours. Urine analysis:    Component Value Date/Time   COLORURINE YELLOW (A) 01/31/2023 1704    APPEARANCEUR CLEAR (A) 01/31/2023 1704   LABSPEC 1.019 01/31/2023 1704   PHURINE 5.0 01/31/2023 1704   GLUCOSEU NEGATIVE 01/31/2023 1704   HGBUR NEGATIVE 01/31/2023 1704   BILIRUBINUR NEGATIVE 01/31/2023 1704   KETONESUR NEGATIVE 01/31/2023 1704   PROTEINUR NEGATIVE 01/31/2023 1704   NITRITE NEGATIVE 01/31/2023 1704   LEUKOCYTESUR NEGATIVE 01/31/2023 1704    Radiological Exams on Admission: CT ABDOMEN PELVIS W CONTRAST Result Date: 08/19/2023 CLINICAL DATA:  Chest pain and back pain. EXAM: CT ABDOMEN AND PELVIS WITH CONTRAST TECHNIQUE: Multidetector CT imaging of the abdomen and pelvis was performed using the standard protocol following bolus administration of intravenous contrast. RADIATION DOSE REDUCTION: This exam was performed according to the departmental dose-optimization program which includes automated exposure control, adjustment of the mA and/or kV according to patient size and/or use of iterative reconstruction technique. CONTRAST:  OMNIPAQUE IOHEXOL 350 MG/ML SOLN COMPARISON:  January 31, 2023 FINDINGS: Lower chest: No acute abnormality. Hepatobiliary: No focal liver abnormality is seen. Stable, moderate severity pneumobilia is noted. Status post cholecystectomy. The common bile duct measures 2.0 cm in diameter. Pancreas: Unremarkable. No pancreatic ductal dilatation or surrounding inflammatory changes. Spleen: Normal in size without focal abnormality. Adrenals/Urinary Tract: Adrenal glands are unremarkable. Kidneys are normal in size, without renal calculi or hydronephrosis. Stable simple cysts are seen within the left kidney. Bladder is unremarkable. Stomach/Bowel: Stomach is within normal limits. Appendix appears normal. No evidence of bowel wall thickening, distention, or inflammatory changes. Vascular/Lymphatic: Aortic atherosclerosis. No enlarged abdominal or pelvic lymph nodes. Reproductive: Prostate is unremarkable. Other: No abdominal wall hernia or abnormality. No  abdominopelvic ascites. Musculoskeletal: Stable chronic, degenerative and postoperative changes are seen throughout the thoracolumbar spine. IMPRESSION: 1. Evidence of prior cholecystectomy with stable, moderate severity pneumobilia. 2. Stable simple left renal cysts. No follow-up imaging is recommended. This recommendation follows ACR consensus guidelines: Management of the Incidental Renal Mass on CT: A White Paper of the ACR Incidental Findings Committee. J Am Coll Radiol 2018;15:264-273. 3. Stable chronic, degenerative and postoperative changes throughout the thoracolumbar spine. 4. Aortic atherosclerosis. Aortic Atherosclerosis (ICD10-I70.0). Electronically Signed   By: Aram Candela M.D.   On: 08/19/2023 21:32   CT Angio Chest PE W and/or Wo  Contrast Result Date: 08/19/2023 CLINICAL DATA:  Chest pain for 2 days EXAM: CT ANGIOGRAPHY CHEST WITH CONTRAST TECHNIQUE: Multidetector CT imaging of the chest was performed using the standard protocol during bolus administration of intravenous contrast. Multiplanar CT image reconstructions and MIPs were obtained to evaluate the vascular anatomy. RADIATION DOSE REDUCTION: This exam was performed according to the departmental dose-optimization program which includes automated exposure control, adjustment of the mA and/or kV according to patient size and/or use of iterative reconstruction technique. CONTRAST:  OMNIPAQUE IOHEXOL 350 MG/ML SOLN COMPARISON:  10/12/2021, chest x-ray from 08/09/2022 FINDINGS: Cardiovascular: Thoracic aorta demonstrates atherosclerotic calcifications without aneurysmal dilatation. The degree of opacification is limited precluding evaluation for dissection. The pulmonary artery shows a normal branching pattern bilaterally. No intraluminal filling defect to suggest pulmonary embolism is noted. Mediastinum/Nodes: Thoracic inlet is within normal limits. No hilar or mediastinal adenopathy is noted. The esophagus as visualized is within  normal limits. Lungs/Pleura: Lungs are well aerated bilaterally. No focal infiltrate or sizable effusion is seen. No parenchymal nodules are noted. Upper Abdomen: Visualized upper abdomen shows pneumobilia. No other focal abnormality is seen. Musculoskeletal: Postsurgical changes are noted in the lower thoracic spine. No acute rib abnormality is noted. Review of the MIP images confirms the above findings. IMPRESSION: No evidence of pulmonary emboli. Pneumobilia stable from prior CT of the abdomen. Aortic Atherosclerosis (ICD10-I70.0). Electronically Signed   By: Alcide Clever M.D.   On: 08/19/2023 21:27   CT Cervical Spine Wo Contrast Result Date: 08/18/2023 CLINICAL DATA:  Neck trauma due to a mechanical fall. Multiple falls over the last week. Neck and back pain. EXAM: CT CERVICAL SPINE WITHOUT CONTRAST TECHNIQUE: Multidetector CT imaging of the cervical spine was performed without intravenous contrast. Multiplanar CT image reconstructions were also generated. RADIATION DOSE REDUCTION: This exam was performed according to the departmental dose-optimization program which includes automated exposure control, adjustment of the mA and/or kV according to patient size and/or use of iterative reconstruction technique. COMPARISON:  10/06/2022 FINDINGS: Alignment: Normal alignment. Skull base and vertebrae: No vertebral compression deformities. No focal bone lesion or bone destruction. Bone cortex appears intact. Soft tissues and spinal canal: No prevertebral soft tissue swelling. No abnormal paraspinal soft tissue mass or infiltration. Disc levels: Degenerative changes throughout with disc space narrowing and endplate osteophyte formation. Degenerative changes in the facet joints. Upper chest: Lung apices are clear. Other: None. IMPRESSION: Normal alignment. No acute displaced fractures identified. Diffuse degenerative changes. Electronically Signed   By: Burman Nieves M.D.   On: 08/18/2023 19:06   DG Lumbar Spine  2-3 Views Result Date: 08/18/2023 CLINICAL DATA:  Lower back pain after multiple falls in the last week. EXAM: LUMBAR SPINE - 2-3 VIEW COMPARISON:  Oct 06, 2022. FINDINGS: Status post surgical posterior fusion extending from T10 to the upper sacrum with bilateral in particular screw placement and Harrington rods. Interbody fusion of L1-2 and L2-3 is noted. Severe old compression deformity of L4 vertebral body is noted. Stable minimal anterolisthesis of L2-3 and L3-4 is noted. No acute fracture or other abnormality is noted. IMPRESSION: Extensive postsurgical and degenerative changes as noted above. No acute abnormalities noted. Electronically Signed   By: Lupita Raider M.D.   On: 08/18/2023 18:47     Data Reviewed: Relevant notes from primary care and specialist visits, past discharge summaries as available in EHR, including Care Everywhere. Prior diagnostic testing as pertinent to current admission diagnoses Updated medications and problem lists for reconciliation ED course, including vitals, labs,  imaging, treatment and response to treatment Triage notes, nursing and pharmacy notes and ED provider's notes Notable results as noted in HPI   Assessment and Plan: * Precordial chest pain Chest pain with typical and atypical features, risk factors include hypertension and hyperlipidemia CTA chest was negative for PE First troponin 14 and EKG nonacute Continue to trend troponin Will consult cardiology and keep n.p.o. in case of stress test  Personal history of pulmonary embolism Long-term anticoagulant Continue Eliquis  Dyslipidemia Not on relevant meds  Hypertension Continue amlodipine and chlorthalidone  Elevated LFTs History of prior cholecystectomy History of hepatitis C LFT elevation with AST 153, ALT 169 and alk phos 325, total bilirubin 2.1.  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 Observation status is judged to be reasonable and necessary in order to provide the required intensity of service to ensure the patient's safety. The patient's presenting symptoms, physical exam findings, and initial radiographic and laboratory data in the context of their medical condition is felt to place them at decreased risk for further clinical deterioration. Furthermore, it is anticipated that the patient will be medically stable for discharge from the hospital within 2 midnights of admission.   Author: Andris Baumann, MD 08/19/2023 11:19 PM  For on call review [email or web address].

## 2023-08-19 NOTE — ED Provider Notes (Signed)
 [facility] Provider Note    Event Date/Time   First MD Initiated Contact with Patient 08/19/23 1921     (approximate)   History   Chief Complaint: Chest Pain   HPI  Earl Murray is a 68 y.o. male with a history of hypertension, hepatitis C, PE on Eliquis who comes to the ED complaining of chest pain which started yesterday around noon while mowing grass.  It has been constant, waxing waning, worse with exertion, somewhat improved with rest.  Feels sharp, also worse with deep breathing.  Nonradiating.  Associated shortness of breath diaphoresis and lightheadedness.  He reports compliance with his Eliquis but does miss occasional doses.  No fever or cough  Patient has never seen a cardiologist, has not had a stress test or cardiac cath in the past.        Physical Exam   Triage Vital Signs: ED Triage Vitals [08/19/23 1921]  Encounter Vitals Group     BP      Systolic BP Percentile      Diastolic BP Percentile      Pulse      Resp      Temp      Temp src      SpO2      Weight      Height      Head Circumference      Peak Flow      Pain Score 5     Pain Loc      Pain Education      Exclude from Growth Chart     Most recent vital signs: Vitals:   08/19/23 2130 08/19/23 2200  BP: 129/77 127/76  Pulse: 70 70  Resp: 15 11  Temp:    SpO2: 97% 100%    General: Awake, no distress.  CV:  Good peripheral perfusion.  Tachycardia heart rate 105 Resp:  Normal effort.  Clear to auscultation bilaterally Abd:  No distention.  Soft with epigastric tenderness Other:  No chest wall tenderness, pain is not reproducible.  No lower extremity edema, leg swelling, or calf tenderness.   ED Results / Procedures / Treatments   Labs (all labs ordered are listed, but only abnormal results are displayed) Labs Reviewed  COMPREHENSIVE METABOLIC PANEL WITH GFR - Abnormal; Notable for the following components:      Result Value   Glucose, Bld 104 (*)    AST  153 (*)    ALT 169 (*)    Alkaline Phosphatase 325 (*)    Total Bilirubin 2.1 (*)    All other components within normal limits  CBC WITH DIFFERENTIAL/PLATELET - Abnormal; Notable for the following components:   WBC 16.9 (*)    RDW 15.7 (*)    Neutro Abs 13.0 (*)    Monocytes Absolute 1.2 (*)    Abs Immature Granulocytes 0.09 (*)    All other components within normal limits  LIPASE, BLOOD  TROPONIN I (HIGH SENSITIVITY)     EKG Interpreted by me Sinus rhythm rate of 86.  Normal axis, normal intervals.  Poor R wave progression.  Normal ST segments and T waves.  No acute ischemic changes.  1 PVC on the strip.   RADIOLOGY CT chest interpreted by me, no pneumothorax or airspace disease.  Radiology report reviewed.  No acute findings.  CT abdomen pelvis without acute findings   PROCEDURES:  Procedures   MEDICATIONS ORDERED IN ED: Medications  nitroGLYCERIN (NITROSTAT) SL tablet 0.4 mg (0.4  mg Sublingual Given 08/19/23 1946)  ketorolac (TORADOL) 15 MG/ML injection 15 mg (has no administration in time range)  aspirin chewable tablet 324 mg (has no administration in time range)  sodium chloride 0.9 % bolus 1,000 mL (0 mLs Intravenous Stopped 08/19/23 2038)  HYDROmorphone (DILAUDID) injection 1 mg (1 mg Intravenous Given 08/19/23 1946)  iohexol (OMNIPAQUE) 350 MG/ML injection 100 mL (100 mLs Intravenous Contrast Given 08/19/23 2051)     IMPRESSION / MDM / ASSESSMENT AND PLAN / ED COURSE  I reviewed the triage vital signs and the nursing notes.  DDx: NSTEMI/unstable angina, pancreatitis, biliary disease, PE, GERD  Patient's presentation is most consistent with acute presentation with potential threat to life or bodily function.  Patient presents with exertional chest pain, ongoing since noon yesterday.  Also a pleuritic component with tachycardia and a history of PE with some lapses in Eliquis compliance.  Will obtain CTA chest.  Labs reveal elevated LFTs, will also obtain CT  abdomen pelvis.   Clinical Course as of 08/19/23 2250  Thu Aug 19, 2023  2212 CTs of chest abdomen pelvis are all unremarkable.  He is status post cholecystectomy.  No pancreatic mass.  Suspect his elevated LFTs are related to his hepatitis C. [PS]  2222 Pain improved, still mildly present.  Initial workup is all reassuring.  Will need to hospitalize for further cardiac workup. [PS]  2250 Case discussed with hospitalist [PS]    Clinical Course User Index [PS] Sharman Cheek, MD     FINAL CLINICAL IMPRESSION(S) / ED DIAGNOSES   Final diagnoses:  Chest pain with moderate risk for cardiac etiology     Rx / DC Orders   ED Discharge Orders     None        Note:  This document was prepared using Dragon voice recognition software and may include unintentional dictation errors.   Sharman Cheek, MD 08/19/23 2250

## 2023-08-19 NOTE — Assessment & Plan Note (Signed)
 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

## 2023-08-19 NOTE — Assessment & Plan Note (Signed)
 Continue paroxetine and hydroxyzine

## 2023-08-19 NOTE — Assessment & Plan Note (Addendum)
 History of cocaine abuse in remission Previously on suboxone through the [place]

## 2023-08-19 NOTE — Assessment & Plan Note (Signed)
 History of prior cholecystectomy History of hepatitis C LFT elevation with AST 153, ALT 169 and alk phos 325, total bilirubin 2.1.  Lipase normal at 38 CT abdomen and pelvis showing pneumobilia stable

## 2023-08-20 ENCOUNTER — Observation Stay

## 2023-08-20 DIAGNOSIS — R072 Precordial pain: Secondary | ICD-10-CM

## 2023-08-20 DIAGNOSIS — R7989 Other specified abnormal findings of blood chemistry: Secondary | ICD-10-CM | POA: Diagnosis not present

## 2023-08-20 DIAGNOSIS — M5489 Other dorsalgia: Secondary | ICD-10-CM

## 2023-08-20 DIAGNOSIS — E785 Hyperlipidemia, unspecified: Secondary | ICD-10-CM

## 2023-08-20 DIAGNOSIS — Z86711 Personal history of pulmonary embolism: Secondary | ICD-10-CM | POA: Diagnosis not present

## 2023-08-20 DIAGNOSIS — F1121 Opioid dependence, in remission: Secondary | ICD-10-CM

## 2023-08-20 DIAGNOSIS — G8929 Other chronic pain: Secondary | ICD-10-CM

## 2023-08-20 DIAGNOSIS — G629 Polyneuropathy, unspecified: Secondary | ICD-10-CM

## 2023-08-20 DIAGNOSIS — I10 Essential (primary) hypertension: Secondary | ICD-10-CM

## 2023-08-20 LAB — NM MYOCAR MULTI W/SPECT W/WALL MOTION / EF
Base ST Depression (mm): 0 mm
LV dias vol: 58 mL (ref 62–150)
LV sys vol: 21 mL
MPHR: 151 {beats}/min
Nuc Stress EF: 64 %
Peak HR: 94 {beats}/min
Percent HR: 62 %
Rest HR: 55 {beats}/min
Rest Nuclear Isotope Dose: 10.4 mCi
SDS: 0
SRS: 0
SSS: 0
ST Depression (mm): 0 mm
Stress Nuclear Isotope Dose: 32.7 mCi
TID: 0.86

## 2023-08-20 LAB — COMPREHENSIVE METABOLIC PANEL WITH GFR
ALT: 151 U/L — ABNORMAL HIGH (ref 0–44)
AST: 94 U/L — ABNORMAL HIGH (ref 15–41)
Albumin: 3.2 g/dL — ABNORMAL LOW (ref 3.5–5.0)
Alkaline Phosphatase: 258 U/L — ABNORMAL HIGH (ref 38–126)
Anion gap: 8 (ref 5–15)
BUN: 15 mg/dL (ref 8–23)
CO2: 28 mmol/L (ref 22–32)
Calcium: 9.1 mg/dL (ref 8.9–10.3)
Chloride: 102 mmol/L (ref 98–111)
Creatinine, Ser: 1.15 mg/dL (ref 0.61–1.24)
GFR, Estimated: >60 mL/min (ref >60)
Glucose, Bld: 105 mg/dL — ABNORMAL HIGH (ref 70–99)
Potassium: 3.6 mmol/L (ref 3.5–5.1)
Sodium: 138 mmol/L (ref 135–145)
Total Bilirubin: 1.9 mg/dL — ABNORMAL HIGH (ref 0.0–1.2)
Total Protein: 7.1 g/dL (ref 6.5–8.1)

## 2023-08-20 LAB — CBC
HCT: 38.4 % — ABNORMAL LOW (ref 39.0–52.0)
Hemoglobin: 13 g/dL (ref 13.0–17.0)
MCH: 30.2 pg (ref 26.0–34.0)
MCHC: 33.9 g/dL (ref 30.0–36.0)
MCV: 89.1 fL (ref 80.0–100.0)
Platelets: 259 10*3/uL (ref 150–400)
RBC: 4.31 MIL/uL (ref 4.22–5.81)
RDW: 15.5 % (ref 11.5–15.5)
WBC: 7.1 10*3/uL (ref 4.0–10.5)
nRBC: 0 % (ref 0.0–0.2)

## 2023-08-20 LAB — TROPONIN I (HIGH SENSITIVITY): Troponin I (High Sensitivity): 12 ng/L (ref <18)

## 2023-08-20 LAB — LIPID PANEL
Cholesterol: 146 mg/dL (ref 0–200)
HDL: 28 mg/dL — ABNORMAL LOW (ref >40)
LDL Cholesterol: 101 mg/dL — ABNORMAL HIGH (ref 0–99)
Total CHOL/HDL Ratio: 5.2 ratio
Triglycerides: 83 mg/dL (ref <150)
VLDL: 17 mg/dL (ref 0–40)

## 2023-08-20 LAB — HIV ANTIBODY (ROUTINE TESTING W REFLEX): HIV Screen 4th Generation wRfx: NONREACTIVE

## 2023-08-20 MED ORDER — OXYCODONE HCL 5 MG PO TABS: 10.0000 mg | ORAL_TABLET | ORAL | Status: DC | PRN

## 2023-08-20 MED ORDER — OXYCODONE HCL 10 MG PO TABS: 10.0000 mg | ORAL_TABLET | Freq: Four times a day (QID) | ORAL | 0 refills | Status: DC | PRN

## 2023-08-20 MED ORDER — PANTOPRAZOLE SODIUM 40 MG PO TBEC: 40.0000 mg | DELAYED_RELEASE_TABLET | Freq: Every day | ORAL | 0 refills | Status: DC

## 2023-08-20 MED ORDER — TECHNETIUM TC 99M TETROFOSMIN IV KIT
10.4400 | PACK | Freq: Once | INTRAVENOUS | Status: AC | PRN
  Administered 2023-08-20: 10.44 via INTRAVENOUS

## 2023-08-20 MED ORDER — LIDOCAINE 5 % EX PTCH
1.0000 | MEDICATED_PATCH | CUTANEOUS | Status: DC
  Administered 2023-08-20: 1 via TRANSDERMAL
  Filled 2023-08-20 (×2): qty 1

## 2023-08-20 MED ORDER — OXYCODONE HCL 5 MG PO TABS
10.0000 mg | ORAL_TABLET | ORAL | Status: AC | PRN
  Administered 2023-08-20: 10 mg via ORAL
  Filled 2023-08-20: qty 2

## 2023-08-20 MED ORDER — TIZANIDINE HCL 4 MG PO TABS
8.0000 mg | ORAL_TABLET | Freq: Every day | ORAL | 0 refills | Status: AC | PRN
Start: 2023-08-20 — End: ?

## 2023-08-20 MED ORDER — REGADENOSON 0.4 MG/5ML IV SOLN
0.4000 mg | Freq: Once | INTRAVENOUS | Status: AC
  Administered 2023-08-20: 0.4 mg via INTRAVENOUS
  Filled 2023-08-20: qty 5

## 2023-08-20 MED ORDER — OXYCODONE HCL 5 MG PO TABS
5.0000 mg | ORAL_TABLET | ORAL | Status: DC | PRN
  Administered 2023-08-20: 5 mg via ORAL
  Filled 2023-08-20 (×2): qty 1

## 2023-08-20 MED ORDER — TECHNETIUM TC 99M TETROFOSMIN IV KIT
32.7000 | PACK | Freq: Once | INTRAVENOUS | Status: AC | PRN
  Administered 2023-08-20: 32.7 via INTRAVENOUS

## 2023-08-20 MED ORDER — GABAPENTIN 300 MG PO CAPS
300.0000 mg | ORAL_CAPSULE | Freq: Three times a day (TID) | ORAL | Status: DC
  Administered 2023-08-20 (×2): 300 mg via ORAL
  Filled 2023-08-20 (×2): qty 1

## 2023-08-20 MED ORDER — KETOROLAC TROMETHAMINE 30 MG/ML IJ SOLN: 30.0000 mg | Freq: Four times a day (QID) | INTRAMUSCULAR | Status: DC | PRN

## 2023-08-20 MED ORDER — KETOROLAC TROMETHAMINE 30 MG/ML IJ SOLN: 15.0000 mg | Freq: Four times a day (QID) | INTRAMUSCULAR | Status: DC | PRN

## 2023-08-20 NOTE — Discharge Summary (Signed)
 Physician Discharge Summary   Patient: Earl Murray MRN: [id number] DOB: Jun 12, 1955  Admit date:     08/19/2023  Discharge date: 08/20/23  Discharge Physician: Alford Highland   PCP: [facility]   Recommendations at discharge:   Follow-up with Hermenia Fiscal at the [place] [facility] Follow-up at the [facility] when available Recommend checking liver function test as outpatient.  Discharge Diagnoses: Principal Problem:   Precordial chest pain Active Problems:   Hypertension   Dyslipidemia   Long term (current) use of anticoagulants   Personal history of pulmonary embolism   Elevated LFTs   History of cholecystectomy   History of hepatitis C   Chronic back pain   History of fusion of lumbar spine   Frequent falls   Polyneuropathy   Opioid dependence in remission [facility])    Hospital Course: 68 y.o. male with medical history significant for HTN, HLD, chronic back pain s/p lumbar fusion, remote history of substance abuse on Suboxone, depression, hepatitis C, pulmonary embolism requiring thrombectomy 09/2021 and prior provoked DVT, on Eliquis with which he is mostly compliant, neuropathy with history of falls, followed by neurology, being admitted with high risk chest pain.  He states the pain started around noon while he was mowing grass and since onset has been waxing and waning.  Is nonradiating and worsened by deep breathing.  Has associated lightheadedness shortness of breath and diaphoresis no nausea or vomiting.  Denies cough fever or chills.  Denies lower extremity pain or swelling. Of note, patient was seen the day prior in the emergency room for a fall.  He complained of the time of multiple falls in the past week and increased lower back pain and neck pain.  He had a CT C-spine and x-ray lumbar spine that was nontraumatic and he was treated with hydromorphone and discharged to follow-up with the pain clinic. ED course andData review: BP 163/90 with otherwise normal  vitals. Labs notable for first troponin of 14.  WBC 17,000.  LFT elevation with AST 153, ALT 169 and alk phos 325, total bilirubin 2.1.  Lipase normal at 38 EKG, personally viewed and interpreted showing sinus rhythm at 86 with an occasional PVC CTA chest negative for PE CT abdomen and pelvis showed evidence of prior cholecystectomy with stable moderate severity pneumobilia also shows multiple stable chronic degenerative postoperative changes throughout the thoracolumbar spine, and stable simple renal cysts-no follow-up recommended.  Aortic atherosclerosis.   Patient was treated with sublingual nitroglycerin, hydromorphone, Toradol he was also given an NS bolus   Hospitalist consulted for admission for chest pain rule out   08/20/2023.  Patient not having any further chest pain but having chronic back pain.  Stress test negative.  Liver function test trending better.  Stable for discharge home.  Assessment and Plan: * Precordial chest pain CTA chest was negative for PE.  Stress test negative.  Troponin negative x 2.  Stable for discharge home.  Not having any further chest pain.   Personal history of pulmonary embolism Long-term anticoagulant Continue Eliquis  Dyslipidemia Not on medication.  LDL actually 101.  Hypertension Continue his usual antihypertensive medication  Elevated LFTs History of prior cholecystectomy History of hepatitis C CT abdomen and pelvis showing pneumobilia stable Alkaline phosphatase down to 258, AST down to 94 ALT down to 151, total bilirubin down to 1.9.  Patient not having any abdominal pain.  Stable for discharge home.  Recommend checking liver function test as outpatient.  Chronic back pain History of  spinal fusion Patient complains of 10 out of 10 back pain Patient can use Salonpas 4% over-the-counter.  Prescribed a few pills of oxycodone.  Patient states he is going to follow-up with pain management.  Can consider going back on Suboxone.  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    Motrin [ibuprofen] Itching, Other (See Comments)   Tramadol Rash   Tylenol With Codeine #3 [acetaminophen-codeine] Itching        Medication List     STOP taking these medications    mirtazapine 15 MG tablet Commonly known as: REMERON   naloxone 4 MG/0.1ML Liqd nasal spray kit Commonly known as: NARCAN       TAKE these medications    amLODipine 10 MG tablet Commonly known as: NORVASC Take 10 mg by mouth daily.   apixaban 5 MG Tabs tablet Commonly known as: ELIQUIS Take 5 mg by mouth 2 (two) times daily.   losartan 100 MG tablet Commonly known as: COZAAR Take 100 mg by mouth daily.   Oxycodone HCl 10 MG Tabs Take 1 tablet (10 mg total) by mouth every 6 (six) hours as needed for severe pain (pain score 7-10).   pantoprazole 40 MG tablet Commonly known as: Protonix Take 1 tablet (40 mg total) by mouth daily.   tiZANidine 4 MG tablet Commonly known as: ZANAFLEX Take 2 tablets (8 mg total) by mouth daily as needed for muscle spasms.        Follow-up Information     Ignacia Bayley, PA-C Follow up in 5 day(s).   Specialty: Physician Assistant Contact information: [address] [place] [address] [phone]                Discharge Exam: Filed Weights   08/19/23 1953  Weight: 90.7 kg   Physical Exam HENT:     Head: Normocephalic.  Eyes:     General: Lids are normal.      Conjunctiva/sclera: Conjunctivae normal.  Cardiovascular:     Rate and Rhythm: Normal rate and regular rhythm.     Heart sounds: Normal heart sounds, S1 normal and S2 normal.  Pulmonary:     Breath sounds: No decreased breath sounds, wheezing, rhonchi or rales.  Abdominal:     Palpations: Abdomen is soft.     Tenderness: There is no abdominal tenderness.  Musculoskeletal:     Right lower leg: No swelling.     Left lower leg: No swelling.  Skin:    General: Skin is warm.     Findings: No rash.  Neurological:     Mental Status: He is alert and oriented to person, place, and time.     Comments: Able to straight leg raise.     Condition at discharge: stable  The results of significant diagnostics from this hospitalization (including imaging, microbiology, ancillary and laboratory) are listed below for reference.   Imaging Studies: NM Myocar Multi W/Spect W/Wall Motion / EF Result Date: 08/20/2023 .  The study is normal. The study is low risk. .  No ST deviation was noted. .  LV perfusion is normal. There is no evidence of ischemia. There is no evidence of infarction. .  Left ventricular function is normal. Nuclear stress EF: 64%. End diastolic cavity size is normal. End systolic cavity size is normal. .  CT attenuation images showed minimal aortic calcifications.   CT ABDOMEN PELVIS W CONTRAST Result Date: 08/19/2023 CLINICAL DATA:  Chest pain and back pain. EXAM: CT ABDOMEN AND PELVIS WITH CONTRAST TECHNIQUE: Multidetector CT imaging of the abdomen and pelvis was performed using the standard protocol following bolus administration of intravenous contrast. RADIATION DOSE REDUCTION: This exam was performed according to the departmental dose-optimization program which includes automated exposure control, adjustment of the mA and/or kV according to patient size and/or use of iterative reconstruction technique. CONTRAST:  OMNIPAQUE IOHEXOL 350 MG/ML SOLN COMPARISON:  January 31, 2023  FINDINGS: Lower chest: No acute abnormality. Hepatobiliary: No focal liver abnormality is seen. Stable, moderate severity pneumobilia is noted. Status post cholecystectomy. The common bile duct measures 2.0 cm in diameter. Pancreas: Unremarkable. No pancreatic ductal dilatation or surrounding inflammatory changes. Spleen: Normal in size without focal abnormality. Adrenals/Urinary Tract: Adrenal glands are unremarkable. Kidneys are normal in size, without renal calculi or hydronephrosis. Stable simple cysts are seen within the left kidney. Bladder is unremarkable. Stomach/Bowel: Stomach is within normal limits. Appendix appears normal. No evidence of bowel wall thickening, distention, or inflammatory changes. Vascular/Lymphatic: Aortic atherosclerosis. No enlarged abdominal or pelvic lymph nodes. Reproductive: Prostate is unremarkable. Other: No abdominal wall hernia or abnormality. No abdominopelvic ascites. Musculoskeletal: Stable chronic, degenerative and postoperative changes are seen throughout the thoracolumbar spine. IMPRESSION: 1. Evidence of prior cholecystectomy with stable, moderate severity pneumobilia. 2. Stable simple left renal cysts. No follow-up imaging is recommended. This recommendation follows ACR consensus guidelines: Management of the Incidental Renal Mass on CT: A White Paper of the ACR Incidental Findings Committee. J Am Coll Radiol 2018;15:264-273. 3. Stable chronic, degenerative and postoperative changes throughout the thoracolumbar spine. 4. Aortic atherosclerosis. Aortic Atherosclerosis (ICD10-I70.0). Electronically Signed   By: Aram Candela M.D.   On: 08/19/2023 21:32   CT Angio Chest PE W and/or Wo Contrast Result Date: 08/19/2023 CLINICAL DATA:  Chest pain for 2 days EXAM: CT ANGIOGRAPHY CHEST WITH CONTRAST TECHNIQUE: Multidetector CT imaging of the chest was performed using the standard protocol during bolus administration of intravenous contrast. Multiplanar CT image  reconstructions and MIPs were obtained to evaluate the vascular anatomy. RADIATION DOSE REDUCTION: This exam was performed according to the departmental dose-optimization program which includes automated exposure control, adjustment of the mA and/or kV according to patient size and/or use of iterative reconstruction technique. CONTRAST:  OMNIPAQUE IOHEXOL 350 MG/ML SOLN COMPARISON:  10/12/2021, chest x-ray from 08/09/2022 FINDINGS: Cardiovascular: Thoracic aorta demonstrates atherosclerotic calcifications without aneurysmal dilatation. The degree of opacification is limited precluding evaluation for dissection. The pulmonary artery shows a normal branching pattern bilaterally. No intraluminal filling defect to suggest pulmonary embolism is noted. Mediastinum/Nodes: Thoracic inlet is within normal limits. No hilar or mediastinal adenopathy is noted. The esophagus as visualized is within normal limits. Lungs/Pleura: Lungs are well aerated bilaterally. No focal infiltrate or sizable effusion is seen. No parenchymal nodules are noted. Upper Abdomen: Visualized upper abdomen shows pneumobilia. No other focal abnormality is seen. Musculoskeletal: Postsurgical changes are noted in the lower thoracic spine. No acute rib abnormality is noted. Review of the MIP images confirms the above findings.  IMPRESSION: No evidence of pulmonary emboli. Pneumobilia stable from prior CT of the abdomen. Aortic Atherosclerosis (ICD10-I70.0). Electronically Signed   By: Alcide Clever M.D.   On: 08/19/2023 21:27   CT Cervical Spine Wo Contrast Result Date: 08/18/2023 CLINICAL DATA:  Neck trauma due to a mechanical fall. Multiple falls over the last week. Neck and back pain. EXAM: CT CERVICAL SPINE WITHOUT CONTRAST TECHNIQUE: Multidetector CT imaging of the cervical spine was performed without intravenous contrast. Multiplanar CT image reconstructions were also generated. RADIATION DOSE REDUCTION: This exam was performed according to  the departmental dose-optimization program which includes automated exposure control, adjustment of the mA and/or kV according to patient size and/or use of iterative reconstruction technique. COMPARISON:  10/06/2022 FINDINGS: Alignment: Normal alignment. Skull base and vertebrae: No vertebral compression deformities. No focal bone lesion or bone destruction. Bone cortex appears intact. Soft tissues and spinal canal: No prevertebral soft tissue swelling. No abnormal paraspinal soft tissue mass or infiltration. Disc levels: Degenerative changes throughout with disc space narrowing and endplate osteophyte formation. Degenerative changes in the facet joints. Upper chest: Lung apices are clear. Other: None. IMPRESSION: Normal alignment. No acute displaced fractures identified. Diffuse degenerative changes. Electronically Signed   By: Burman Nieves M.D.   On: 08/18/2023 19:06   DG Lumbar Spine 2-3 Views Result Date: 08/18/2023 CLINICAL DATA:  Lower back pain after multiple falls in the last week. EXAM: LUMBAR SPINE - 2-3 VIEW COMPARISON:  Oct 06, 2022. FINDINGS: Status post surgical posterior fusion extending from T10 to the upper sacrum with bilateral in particular screw placement and Harrington rods. Interbody fusion of L1-2 and L2-3 is noted. Severe old compression deformity of L4 vertebral body is noted. Stable minimal anterolisthesis of L2-3 and L3-4 is noted. No acute fracture or other abnormality is noted. IMPRESSION: Extensive postsurgical and degenerative changes as noted above. No acute abnormalities noted. Electronically Signed   By: Lupita Raider M.D.   On: 08/18/2023 18:47    Microbiology: Results for orders placed or performed during the hospital encounter of 10/12/21  MRSA Next Gen by PCR, Nasal     Status: Abnormal   Collection Time: 10/13/21 12:05 AM   Specimen: Nasal Mucosa; Nasal Swab  Result Value Ref Range Status   MRSA by PCR Next Gen DETECTED (A) NOT DETECTED Final    Comment:  CRITICAL RESULT CALLED TO, READ BACK BY AND VERIFIED WITH: Dario Guardian RN @ [phone] 10/13/21 [facility] (NOTE) The GeneXpert MRSA Assay (FDA approved for NASAL specimens only), is one component of a comprehensive MRSA colonization surveillance program. It is not intended to diagnose MRSA infection nor to guide or monitor treatment for MRSA infections. Test performance is not FDA approved in patients less than 1 years old. Performed at [facility], [address] [place], [place] [address]     Labs: CBC: Recent Labs  Lab 08/19/23 1936 08/20/23 0821  WBC 16.9* 7.1  NEUTROABS 13.0*  --   HGB 14.6 13.0  HCT 42.2 38.4*  MCV 87.6 89.1  PLT 300 259   Basic Metabolic Panel: Recent Labs  Lab 08/19/23 1936 08/20/23 0821  NA 139 138  K 3.5 3.6  CL 103 102  CO2 26 28  GLUCOSE 104* 105*  BUN 18 15  CREATININE 1.12 1.15  CALCIUM 9.6 9.1   Liver Function Tests: Recent Labs  Lab 08/19/23 1936 08/20/23 0821  AST 153* 94*  ALT 169* 151*  ALKPHOS 325* 258*  BILITOT 2.1* 1.9*  PROT 8.0  7.1  ALBUMIN 3.9 3.2*   CBG: No results for input(s): "GLUCAP" in the last 168 hours.  Discharge time spent: greater than 30 minutes.  Signed: Alford Highland, MD [facility] 08/20/2023

## 2023-08-20 NOTE — Discharge Instructions (Addendum)
 Recommend checking liver function test as outpatient  Can use salonpas 4% (over the counter) for your chronic low back pain

## 2023-08-20 NOTE — Hospital Course (Signed)
 68 y.o. male with medical history significant for HTN, HLD, chronic back pain s/p lumbar fusion, remote history of substance abuse on Suboxone, depression, hepatitis C, pulmonary embolism requiring thrombectomy 09/2021 and prior provoked DVT, on Eliquis with which he is mostly compliant, neuropathy with history of falls, followed by neurology, being admitted with high risk chest pain.  He states the pain started around noon while he was mowing grass and since onset has been waxing and waning.  Is nonradiating and worsened by deep breathing.  Has associated lightheadedness shortness of breath and diaphoresis no nausea or vomiting.  Denies cough fever or chills.  Denies lower extremity pain or swelling. Of note, patient was seen the day prior in the emergency room for a fall.  He complained of the time of multiple falls in the past week and increased lower back pain and neck pain.  He had a CT C-spine and x-ray lumbar spine that was nontraumatic and he was treated with hydromorphone and discharged to follow-up with the pain clinic. ED course andData review: BP 163/90 with otherwise normal vitals. Labs notable for first troponin of 14.  WBC 17,000.  LFT elevation with AST 153, ALT 169 and alk phos 325, total bilirubin 2.1.  Lipase normal at 38 EKG, personally viewed and interpreted showing sinus rhythm at 86 with an occasional PVC CTA chest negative for PE CT abdomen and pelvis showed evidence of prior cholecystectomy with stable moderate severity pneumobilia also shows multiple stable chronic degenerative postoperative changes throughout the thoracolumbar spine, and stable simple renal cysts-no follow-up recommended.  Aortic atherosclerosis.   Patient was treated with sublingual nitroglycerin, hydromorphone, Toradol he was also given an NS bolus   Hospitalist consulted for admission for chest pain rule out   08/20/2023.  Patient not having any further chest pain but having chronic back pain.  Stress test  negative.  Liver function test trending better.  Stable for discharge home.

## 2023-08-20 NOTE — Consult Note (Signed)
 Cardiology Consultation   Patient ID: Felipe Paluch MRN: [id number]; DOB: 1956-01-08  Admit date: 08/19/2023 Date of Consult: 08/20/2023  PCP:  [facility]   [facility] Providers Cardiologist:  None      New consult completed by Dr Kirke Corin  Patient Profile:   Kashius Dominic is a 68 y.o. male with a hx of chronic pain, chronic back status post lumbar fusion, hypertension, bladder cancer, hepatitis C, pulmonary embolism requiring thrombectomy (09/2021) and prior provoked DVT on chronic apixaban, opioid dependence that is in remission, cocaine dependence currently in remission, CKD who is being seen 08/20/2023 for the evaluation of chest pain at the request of Dr. Para March.  History of Present Illness:   Mr. Brabec presented to the [facility] emergency department from home via EMS as for complaints of mechanical fall on 08/18/2023.  He had sustained multiple falls over the last week.  He has a longstanding history of back pain but on arrival complained of neck and back pain.  He denies any LOC or hitting of his head but does take blood thinners.  He was treated with pain medication and sent for x-rays.  A short period later patient was found lying in the floor next to the stretcher in the emergency department stating that he had fallen asleep and rolled off the stretcher.    He presented to the [facility] emergency department 08/19/2023 with complaints of chest discomfort which began yesterday.  He has no prior cardiac history.  He stated this pain began around 12 PM the day before and has gotten worse today.  His EKG for EMS was within normal limits and he was treated with 324 mg of aspirin prior to arrival.  Vital signs are stable in transport.  He stated that his chest pain started with exertion when he was mowing grass.  It been constant and was waxing and waning.  It is somewhat improved with rest.  He describes it as feeling sharp and was worse with inspiration.  He stated that it did not  radiate but he did have some occasional shortness of breath, diaphoresis, and lightheadedness.  Denies any prior cardiac history.  Initial vital signs: Blood pressure 129/77, pulse of 70, respirations 16, temperature 99.1  Pertinent labs: Blood glucose 104, AST 153, ALT 169, alkaline phosphatase 325, total bilirubin 2.1, WBC 16.9, high-sensitivity troponin 14  Imaging: CTA of the chest revealed no evidence of pulmonary emboli, pneumonia or biliary stable from prior CT of the abdomen, aortic atherosclerosis; CT abdomen pelvis with contrast showed evidence of prior cholecystectomy was stable moderate in severity pneumobilia, stable simple left renal cyst, stable chronic degenerative and postoperative changes throughout the thoracic and lumbar spine, and aortic atherosclerosis  Medications administered in the emergency department: Hydromorphone 1 mg IVP, he was given nitroglycerin 0.4 mg sublingual, Toradol 15 mg, aspirin 324 mg, normal saline 1 L bolus  Cardiology was consulted for concerns of chest pain.  Past Medical History:  Diagnosis Date   Bladder cancer (HCC)    Choledocholithiasis    Chronic kidney disease    Cocaine dependence in remission (HCC)    Concussion    Depression    DVT (deep venous thrombosis) (HCC)    GERD (gastroesophageal reflux disease)    Hepatitis C    History of cancer    Hyperlipidemia    Hypertension    Migraine    Neck injury    Opioid dependence in remission (HCC)    Pancreatitis  Prediabetes    Scoliosis    Sleep apnea     Past Surgical History:  Procedure Laterality Date   BACK SURGERY     CHOLECYSTECTOMY     ELBOW SURGERY     ERCP N/A 10/27/2019   Procedure: ENDOSCOPIC RETROGRADE CHOLANGIOPANCREATOGRAPHY (ERCP);  Surgeon: Midge Minium, MD;  Location: [facility] ENDOSCOPY;  Service: Endoscopy;  Laterality: N/A;   FOOT SURGERY     HERNIA REPAIR     4 hernia repairs   PULMONARY THROMBECTOMY N/A 10/14/2021   Procedure: PULMONARY THROMBECTOMY;   Surgeon: Renford Dills, MD;  Location: [facility] INVASIVE CV LAB;  Service: Cardiovascular;  Laterality: N/A;     Home Medications:  Prior to Admission medications   Medication Sig Start Date End Date Taking? Authorizing Provider  amLODipine (NORVASC) 10 MG tablet Take 10 mg by mouth daily.     [provider]  amoxicillin-clavulanate (AUGMENTIN) 875-125 MG tablet Take 1 tablet by mouth every 12 (twelve) hours. 12/26/21   Domenick Gong, MD  APIXABAN Everlene Balls) VTE STARTER PACK (10MG  AND 5MG ) Take as directed on package: start with two-5mg  tablets twice daily for 7 days. On day 8, switch to one-5mg  tablet twice daily. 10/15/21   Leeroy Bock, MD  chlorthalidone (HYGROTON) 25 MG tablet Take 25 mg by mouth daily. 04/17/22   [provider]  hydrochlorothiazide (HYDRODIURIL) 25 MG tablet Take 1 tablet (25 mg total) by mouth daily. 08/15/18   Tommie Sams, DO  hydrOXYzine (ATARAX) 25 MG tablet Take 25 mg by mouth 2 (two) times daily as needed for anxiety. 05/15/22   [provider]  naproxen (NAPROSYN) 500 MG tablet Take 1 tablet (500 mg total) by mouth 2 (two) times daily with a meal. 10/06/22   Sharman Cheek, MD  PARoxetine (PAXIL) 20 MG tablet Take 20 mg by mouth daily. 05/15/22   [provider]  gabapentin (NEURONTIN) 300 MG capsule gabapentin 300 mg capsule  one tab bid-tid  03/01/19  [provider]  ipratropium (ATROVENT) 0.06 % nasal spray Place 2 sprays into both nostrils 4 (four) times daily as needed for rhinitis. 10/22/19 03/16/20  Verlee Monte, NP  levocetirizine (XYZAL) 5 MG tablet Take 1 tablet (5 mg total) by mouth every evening. 09/30/19 03/16/20  Tommie Sams, DO    Inpatient Medications: Scheduled Meds:  amLODipine  10 mg Oral Daily   apixaban  5 mg Oral BID   aspirin EC  81 mg Oral Daily   gabapentin  300 mg Oral TID   ketorolac  15 mg Intravenous Once   lidocaine  1 patch Transdermal Q24H   PARoxetine  20 mg Oral Daily    Continuous Infusions:  PRN Meds: acetaminophen, ketorolac, nitroGLYCERIN, ondansetron (ZOFRAN) IV, oxyCODONE  Allergies:    Allergies  Allergen Reactions   Toradol [Ketorolac Tromethamine] Itching    Per pt it makes him itch.    Motrin [Ibuprofen] Itching and Other (See Comments)   Tramadol Rash   Tylenol With Codeine #3 [Acetaminophen-Codeine] Itching    Social History:   Social History   Socioeconomic History   Marital status: Married    Spouse name: Not on file   Number of children: Not on file   Years of education: Not on file   Highest education level: Not on file  Occupational History   Not on file  Tobacco Use   Smoking status: Former    Current packs/day: 0.00    Types: Cigarettes    Quit date:  05/2018    Years since quitting: 5.2   Smokeless tobacco: Never  Vaping Use   Vaping status: Never Used  Substance and Sexual Activity   Alcohol use: Yes    Comment: occ   Drug use: Not Currently   Sexual activity: Not on file  Other Topics Concern   Not on file  Social History Narrative   Not on file   Social Drivers of Health   Financial Resource Strain: High Risk (08/18/2023)   Received from [facility]   Overall Financial Resource Strain (CARDIA)    Difficulty of Paying Living Expenses: Hard  Food Insecurity: Food Insecurity Present (08/18/2023)   Received from [facility]   Hunger Vital Sign    Worried About Running Out of Food in the Last Year: Often true    Ran Out of Food in the Last Year: Never true  Transportation Needs: Unmet Transportation Needs (08/18/2023)   Received from [facility]   PRAPARE - Transportation    In the past 12 months, has lack of transportation kept you from medical appointments or from getting medications?: No    Lack of Transportation (Non-Medical): Yes  Physical Activity: Not on file  Stress: Not on file  Social Connections: Not on file  Intimate Partner Violence:  Not on file    Family History:    Family History  Problem Relation Age of Onset   Hypertension Mother    Heart disease Mother    Colon cancer Father      ROS:  Please see the history of present illness.  Review of Systems  Respiratory:  Positive for shortness of breath.   Cardiovascular:  Positive for chest pain.  Musculoskeletal:  Positive for back pain and falls.  Neurological:  Positive for weakness.    All other ROS reviewed and negative.     Physical Exam/Data:   Vitals:   08/19/23 2344 08/20/23 0130 08/20/23 0500 08/20/23 0800  BP: 129/87 119/74 120/73 120/66  Pulse: 86 61 (!) 58 63  Resp: 20 15 19 15   Temp: 98 F (36.7 C)  98.2 F (36.8 C)   TempSrc: Oral  Oral   SpO2: 98% 96% 96% 95%  Weight:      Height:       No intake or output data in the 24 hours ending 08/20/23 0803    08/19/2023    7:53 PM 08/18/2023    5:11 PM 01/31/2023    1:51 PM  Last 3 Weights  Weight (lbs) 200 lb 180 lb 200 lb  Weight (kg) 90.719 kg 81.647 kg 90.719 kg     Body mass index is 23.72 kg/m.  General:  Well nourished, well developed, in no acute distress HEENT: normal Neck: no JVD Vascular: No carotid bruits; Distal pulses 2+ bilaterally Cardiac:  normal S1, S2; RRR; no murmur  Lungs:  clear to auscultation bilaterally, no wheezing, rhonchi or rales  Abd: soft, nontender, no hepatomegaly  Ext: no edema Musculoskeletal:  No deformities, BUE and BLE strength normal and equal Skin: warm and dry  Neuro:  CNs 2-12 intact, no focal abnormalities noted Psych:  Normal affect   EKG:  The EKG was personally reviewed and demonstrates: Sinus rhythm with a rate of 86, unifocal PVCs, and LVH Telemetry:  Telemetry was personally reviewed and demonstrates:  sinus rates in the 60's  Relevant CV Studies:   Laboratory Data:  High Sensitivity Troponin:   Recent Labs  Lab 08/19/23 1936  TROPONINIHS 14     Chemistry Recent Labs  Lab 08/19/23 1936  NA 139  K 3.5  CL 103  CO2 26   GLUCOSE 104*  BUN 18  CREATININE 1.12  CALCIUM 9.6  GFRNONAA >60  ANIONGAP 10    Recent Labs  Lab 08/19/23 1936  PROT 8.0  ALBUMIN 3.9  AST 153*  ALT 169*  ALKPHOS 325*  BILITOT 2.1*   Lipids No results for input(s): "CHOL", "TRIG", "HDL", "LABVLDL", "LDLCALC", "CHOLHDL" in the last 168 hours.  Hematology Recent Labs  Lab 08/19/23 1936  WBC 16.9*  RBC 4.82  HGB 14.6  HCT 42.2  MCV 87.6  MCH 30.3  MCHC 34.6  RDW 15.7*  PLT 300   Thyroid No results for input(s): "TSH", "FREET4" in the last 168 hours.  BNPNo results for input(s): "BNP", "PROBNP" in the last 168 hours.  DDimer No results for input(s): "DDIMER" in the last 168 hours.   Radiology/Studies:  CT ABDOMEN PELVIS W CONTRAST Result Date: 08/19/2023 CLINICAL DATA:  Chest pain and back pain. EXAM: CT ABDOMEN AND PELVIS WITH CONTRAST TECHNIQUE: Multidetector CT imaging of the abdomen and pelvis was performed using the standard protocol following bolus administration of intravenous contrast. RADIATION DOSE REDUCTION: This exam was performed according to the departmental dose-optimization program which includes automated exposure control, adjustment of the mA and/or kV according to patient size and/or use of iterative reconstruction technique. CONTRAST:  OMNIPAQUE IOHEXOL 350 MG/ML SOLN COMPARISON:  January 31, 2023 FINDINGS: Lower chest: No acute abnormality. Hepatobiliary: No focal liver abnormality is seen. Stable, moderate severity pneumobilia is noted. Status post cholecystectomy. The common bile duct measures 2.0 cm in diameter. Pancreas: Unremarkable. No pancreatic ductal dilatation or surrounding inflammatory changes. Spleen: Normal in size without focal abnormality. Adrenals/Urinary Tract: Adrenal glands are unremarkable. Kidneys are normal in size, without renal calculi or hydronephrosis. Stable simple cysts are seen within the left kidney. Bladder is unremarkable. Stomach/Bowel: Stomach is within normal  limits. Appendix appears normal. No evidence of bowel wall thickening, distention, or inflammatory changes. Vascular/Lymphatic: Aortic atherosclerosis. No enlarged abdominal or pelvic lymph nodes. Reproductive: Prostate is unremarkable. Other: No abdominal wall hernia or abnormality. No abdominopelvic ascites. Musculoskeletal: Stable chronic, degenerative and postoperative changes are seen throughout the thoracolumbar spine. IMPRESSION: 1. Evidence of prior cholecystectomy with stable, moderate severity pneumobilia. 2. Stable simple left renal cysts. No follow-up imaging is recommended. This recommendation follows ACR consensus guidelines: Management of the Incidental Renal Mass on CT: A White Paper of the ACR Incidental Findings Committee. J Am Coll Radiol 2018;15:264-273. 3. Stable chronic, degenerative and postoperative changes throughout the thoracolumbar spine. 4. Aortic atherosclerosis. Aortic Atherosclerosis (ICD10-I70.0). Electronically Signed   By: Aram Candela M.D.   On: 08/19/2023 21:32   CT Angio Chest PE W and/or Wo Contrast Result Date: 08/19/2023 CLINICAL DATA:  Chest pain for 2 days EXAM: CT ANGIOGRAPHY CHEST WITH CONTRAST TECHNIQUE: Multidetector CT imaging of the chest was performed using the standard protocol during bolus administration of intravenous contrast. Multiplanar CT image reconstructions and MIPs were obtained to evaluate the vascular anatomy. RADIATION DOSE REDUCTION: This exam was performed according to the departmental dose-optimization program which includes automated exposure control, adjustment of the mA and/or kV according to patient size and/or use of iterative reconstruction technique. CONTRAST:  OMNIPAQUE IOHEXOL 350 MG/ML SOLN COMPARISON:  10/12/2021, chest x-ray from 08/09/2022 FINDINGS: Cardiovascular: Thoracic aorta demonstrates atherosclerotic calcifications without aneurysmal dilatation. The degree of opacification is limited precluding evaluation for  dissection.  The pulmonary artery shows a normal branching pattern bilaterally. No intraluminal filling defect to suggest pulmonary embolism is noted. Mediastinum/Nodes: Thoracic inlet is within normal limits. No hilar or mediastinal adenopathy is noted. The esophagus as visualized is within normal limits. Lungs/Pleura: Lungs are well aerated bilaterally. No focal infiltrate or sizable effusion is seen. No parenchymal nodules are noted. Upper Abdomen: Visualized upper abdomen shows pneumobilia. No other focal abnormality is seen. Musculoskeletal: Postsurgical changes are noted in the lower thoracic spine. No acute rib abnormality is noted. Review of the MIP images confirms the above findings. IMPRESSION: No evidence of pulmonary emboli. Pneumobilia stable from prior CT of the abdomen. Aortic Atherosclerosis (ICD10-I70.0). Electronically Signed   By: Alcide Clever M.D.   On: 08/19/2023 21:27   CT Cervical Spine Wo Contrast Result Date: 08/18/2023 CLINICAL DATA:  Neck trauma due to a mechanical fall. Multiple falls over the last week. Neck and back pain. EXAM: CT CERVICAL SPINE WITHOUT CONTRAST TECHNIQUE: Multidetector CT imaging of the cervical spine was performed without intravenous contrast. Multiplanar CT image reconstructions were also generated. RADIATION DOSE REDUCTION: This exam was performed according to the departmental dose-optimization program which includes automated exposure control, adjustment of the mA and/or kV according to patient size and/or use of iterative reconstruction technique. COMPARISON:  10/06/2022 FINDINGS: Alignment: Normal alignment. Skull base and vertebrae: No vertebral compression deformities. No focal bone lesion or bone destruction. Bone cortex appears intact. Soft tissues and spinal canal: No prevertebral soft tissue swelling. No abnormal paraspinal soft tissue mass or infiltration. Disc levels: Degenerative changes throughout with disc space narrowing and endplate osteophyte  formation. Degenerative changes in the facet joints. Upper chest: Lung apices are clear. Other: None. IMPRESSION: Normal alignment. No acute displaced fractures identified. Diffuse degenerative changes. Electronically Signed   By: Burman Nieves M.D.   On: 08/18/2023 19:06   DG Lumbar Spine 2-3 Views Result Date: 08/18/2023 CLINICAL DATA:  Lower back pain after multiple falls in the last week. EXAM: LUMBAR SPINE - 2-3 VIEW COMPARISON:  Oct 06, 2022. FINDINGS: Status post surgical posterior fusion extending from T10 to the upper sacrum with bilateral in particular screw placement and Harrington rods. Interbody fusion of L1-2 and L2-3 is noted. Severe old compression deformity of L4 vertebral body is noted. Stable minimal anterolisthesis of L2-3 and L3-4 is noted. No acute fracture or other abnormality is noted. IMPRESSION: Extensive postsurgical and degenerative changes as noted above. No acute abnormalities noted. Electronically Signed   By: Lupita Raider M.D.   On: 08/18/2023 18:47     Assessment and Plan:   Chest pain -Typical and atypical features -High-sensitivity troponin negative, additional troponins ordered awaiting results -EKG nonacute -No prior cardiovascular history -Continued on aspirin 81 mg daily -currently chest pain free -Stress testing ordered for this morning with further recommendations to follow -Continue n.p.o. until after testing  Hypertension -Blood pressure 120/66 -Continued on amlodipine 10 mg daily -Vital signs per unit protocol  Hyperlipidemia -Lipid panel pending -Not on statin therapy due to elevated LFTs likely secondary to hepatitis C  History of pulmonary embolism and DVT -CT of the chest negative for PE -Prior history of provoked DVT and PE -Continued on apixaban 5 mg twice daily  Elevated LFTs with history of hepatitis C -AST 153, ALT 169, alkaline phos 325, total bilirubin 2.1, lipase 38 -CT abdomen pelvis showing pneumobilia, there was  stable -Ongoing management per IM  Frequent falls -Followed by neurology outpatient -Prior EMG completed -Continue with physical therapy  Chronic pain -continued on as needed pain medications and lidocaine patch -Supportive care -Ongoing management per IM  Polysubstance abuse -Opioid dependence in remission -History of cocaine abuse -Remains on Suboxone through the [place] -Ongoing management per IM  Informed Consent   Shared Decision Making/Informed Consent The risks [chest pain, shortness of breath, cardiac arrhythmias, dizziness, blood pressure fluctuations, myocardial infarction, stroke/transient ischemic attack, nausea, vomiting, allergic reaction, radiation exposure, metallic taste sensation and life-threatening complications (estimated to be 1 in 10,000)], benefits (risk stratification, diagnosing coronary artery disease, treatment guidance) and alternatives of a nuclear stress test were discussed in detail with Mr. Waiters and he agrees to proceed.      Risk Assessment/Risk Scores:     TIMI Risk Score for Unstable Angina or Non-ST Elevation MI:   The patient's TIMI risk score is 1, which indicates a 5% risk of all cause mortality, new or recurrent myocardial infarction or need for urgent revascularization in the next 14 days.          For questions or updates, please contact [facility] Please consult [email or web address] for contact info under    Signed, Tanveer Brammer, NP  08/20/2023 8:03 AM

## 2023-08-21 LAB — LIPOPROTEIN A (LPA): Lipoprotein (a): 62.5 nmol/L — ABNORMAL HIGH (ref <75.0)

## 2023-08-25 ENCOUNTER — Other Ambulatory Visit: Payer: Self-pay

## 2023-08-25 ENCOUNTER — Observation Stay
Admission: EM | Admit: 2023-08-25 | Discharge: 2023-08-27 | Disposition: A | Discharge status: Home / Self Care | Attending: Emergency Medicine | Admitting: Emergency Medicine

## 2023-08-25 ENCOUNTER — Emergency Department

## 2023-08-25 DIAGNOSIS — Z86711 Personal history of pulmonary embolism: Secondary | ICD-10-CM | POA: Diagnosis not present

## 2023-08-25 DIAGNOSIS — Z86718 Personal history of other venous thrombosis and embolism: Secondary | ICD-10-CM | POA: Insufficient documentation

## 2023-08-25 DIAGNOSIS — Z79899 Other long term (current) drug therapy: Secondary | ICD-10-CM | POA: Diagnosis not present

## 2023-08-25 DIAGNOSIS — I10 Essential (primary) hypertension: Secondary | ICD-10-CM | POA: Diagnosis present

## 2023-08-25 DIAGNOSIS — R7989 Other specified abnormal findings of blood chemistry: Secondary | ICD-10-CM | POA: Insufficient documentation

## 2023-08-25 DIAGNOSIS — R7401 Elevation of levels of liver transaminase levels: Secondary | ICD-10-CM | POA: Diagnosis not present

## 2023-08-25 DIAGNOSIS — I129 Hypertensive chronic kidney disease with stage 1 through stage 4 chronic kidney disease, or unspecified chronic kidney disease: Secondary | ICD-10-CM | POA: Insufficient documentation

## 2023-08-25 DIAGNOSIS — Z7901 Long term (current) use of anticoagulants: Secondary | ICD-10-CM | POA: Diagnosis not present

## 2023-08-25 DIAGNOSIS — K8050 Calculus of bile duct without cholangitis or cholecystitis without obstruction: Secondary | ICD-10-CM | POA: Diagnosis not present

## 2023-08-25 DIAGNOSIS — R079 Chest pain, unspecified: Secondary | ICD-10-CM | POA: Diagnosis not present

## 2023-08-25 DIAGNOSIS — K625 Hemorrhage of anus and rectum: Secondary | ICD-10-CM

## 2023-08-25 DIAGNOSIS — N189 Chronic kidney disease, unspecified: Secondary | ICD-10-CM | POA: Diagnosis not present

## 2023-08-25 DIAGNOSIS — E876 Hypokalemia: Secondary | ICD-10-CM | POA: Insufficient documentation

## 2023-08-25 DIAGNOSIS — I82462 Acute embolism and thrombosis of left calf muscular vein: Secondary | ICD-10-CM

## 2023-08-25 DIAGNOSIS — Z8551 Personal history of malignant neoplasm of bladder: Secondary | ICD-10-CM | POA: Diagnosis not present

## 2023-08-25 DIAGNOSIS — R1084 Generalized abdominal pain: Secondary | ICD-10-CM | POA: Diagnosis not present

## 2023-08-25 DIAGNOSIS — Z87891 Personal history of nicotine dependence: Secondary | ICD-10-CM | POA: Diagnosis not present

## 2023-08-25 DIAGNOSIS — R112 Nausea with vomiting, unspecified: Secondary | ICD-10-CM | POA: Diagnosis not present

## 2023-08-25 DIAGNOSIS — R0602 Shortness of breath: Secondary | ICD-10-CM | POA: Insufficient documentation

## 2023-08-25 DIAGNOSIS — I82409 Acute embolism and thrombosis of unspecified deep veins of unspecified lower extremity: Secondary | ICD-10-CM | POA: Diagnosis present

## 2023-08-25 LAB — HEPATIC FUNCTION PANEL
ALT: 267 U/L — ABNORMAL HIGH (ref 0–44)
AST: 253 U/L — ABNORMAL HIGH (ref 15–41)
Albumin: 3.6 g/dL (ref 3.5–5.0)
Alkaline Phosphatase: 298 U/L — ABNORMAL HIGH (ref 38–126)
Bilirubin, Direct: 0.5 mg/dL — ABNORMAL HIGH (ref 0.0–0.2)
Indirect Bilirubin: 1.5 mg/dL — ABNORMAL HIGH (ref 0.3–0.9)
Total Bilirubin: 2 mg/dL — ABNORMAL HIGH (ref 0.0–1.2)
Total Protein: 7.7 g/dL (ref 6.5–8.1)

## 2023-08-25 LAB — URINALYSIS, W/ REFLEX TO CULTURE (INFECTION SUSPECTED)
Bacteria, UA: NONE SEEN
Bilirubin Urine: NEGATIVE
Glucose, UA: NEGATIVE mg/dL
Hgb urine dipstick: NEGATIVE
Ketones, ur: NEGATIVE mg/dL
Leukocytes,Ua: NEGATIVE
Nitrite: NEGATIVE
Protein, ur: NEGATIVE mg/dL
Specific Gravity, Urine: >1.046 — ABNORMAL HIGH (ref 1.005–1.030)
pH: 5 (ref 5.0–8.0)

## 2023-08-25 LAB — LACTIC ACID, PLASMA
Lactic Acid, Venous: 2.8 mmol/L (ref 0.5–1.9)
Lactic Acid, Venous: 2.7 mmol/L (ref 0.5–1.9)
Lactic Acid, Venous: 1.4 mmol/L (ref 0.5–1.9)

## 2023-08-25 LAB — CBC
HCT: 41.4 % (ref 39.0–52.0)
Hemoglobin: 14 g/dL (ref 13.0–17.0)
MCH: 30 pg (ref 26.0–34.0)
MCHC: 33.8 g/dL (ref 30.0–36.0)
MCV: 88.8 fL (ref 80.0–100.0)
Platelets: 302 10*3/uL (ref 150–400)
RBC: 4.66 MIL/uL (ref 4.22–5.81)
RDW: 15.9 % — ABNORMAL HIGH (ref 11.5–15.5)
WBC: 11.7 10*3/uL — ABNORMAL HIGH (ref 4.0–10.5)
nRBC: 0 % (ref 0.0–0.2)

## 2023-08-25 LAB — BASIC METABOLIC PANEL WITH GFR
Anion gap: 12 (ref 5–15)
BUN: 10 mg/dL (ref 8–23)
CO2: 28 mmol/L (ref 22–32)
Calcium: 9.4 mg/dL (ref 8.9–10.3)
Chloride: 103 mmol/L (ref 98–111)
Creatinine, Ser: 1.22 mg/dL (ref 0.61–1.24)
GFR, Estimated: >60 mL/min (ref >60)
Glucose, Bld: 125 mg/dL — ABNORMAL HIGH (ref 70–99)
Potassium: 3.4 mmol/L — ABNORMAL LOW (ref 3.5–5.1)
Sodium: 143 mmol/L (ref 135–145)

## 2023-08-25 LAB — APTT: aPTT: 25 s (ref 24–36)

## 2023-08-25 LAB — TROPONIN I (HIGH SENSITIVITY)
Troponin I (High Sensitivity): 10 ng/L (ref <18)
Troponin I (High Sensitivity): 10 ng/L (ref <18)

## 2023-08-25 LAB — LIPASE, BLOOD: Lipase: 29 U/L (ref 11–51)

## 2023-08-25 LAB — PROTIME-INR
INR: 1 (ref 0.8–1.2)
Prothrombin Time: 13.6 s (ref 11.4–15.2)

## 2023-08-25 LAB — BRAIN NATRIURETIC PEPTIDE: B Natriuretic Peptide: 47.8 pg/mL (ref 0.0–100.0)

## 2023-08-25 MED ORDER — IOHEXOL 350 MG/ML SOLN
100.0000 mL | Freq: Once | INTRAVENOUS | Status: AC | PRN
  Administered 2023-08-25: 100 mL via INTRAVENOUS

## 2023-08-25 MED ORDER — HYDROMORPHONE HCL 1 MG/ML IJ SOLN
1.0000 mg | Freq: Once | INTRAMUSCULAR | Status: AC
  Administered 2023-08-25: 1 mg via INTRAVENOUS
  Filled 2023-08-25: qty 1

## 2023-08-25 MED ORDER — OXYCODONE HCL 5 MG PO TABS
5.0000 mg | ORAL_TABLET | Freq: Four times a day (QID) | ORAL | Status: DC | PRN
  Administered 2023-08-26: 5 mg via ORAL
  Filled 2023-08-25: qty 1

## 2023-08-25 MED ORDER — AMLODIPINE BESYLATE 5 MG PO TABS
10.0000 mg | ORAL_TABLET | Freq: Every day | ORAL | Status: DC
Start: 2023-08-26 — End: 2023-08-27
  Administered 2023-08-26 – 2023-08-27 (×2): 10 mg via ORAL
  Filled 2023-08-25: qty 2
  Filled 2023-08-25: qty 1

## 2023-08-25 MED ORDER — DICYCLOMINE HCL 20 MG PO TABS
20.0000 mg | ORAL_TABLET | Freq: Once | ORAL | Status: AC
  Administered 2023-08-25: 20 mg via ORAL
  Filled 2023-08-25: qty 1

## 2023-08-25 MED ORDER — DIPHENHYDRAMINE HCL 50 MG/ML IJ SOLN: 12.5000 mg | Freq: Three times a day (TID) | INTRAMUSCULAR | Status: DC | PRN

## 2023-08-25 MED ORDER — SODIUM CHLORIDE 0.9 % IV BOLUS
500.0000 mL | Freq: Once | INTRAVENOUS | Status: AC
  Administered 2023-08-26: 500 mL via INTRAVENOUS

## 2023-08-25 MED ORDER — POTASSIUM CHLORIDE CRYS ER 20 MEQ PO TBCR
20.0000 meq | EXTENDED_RELEASE_TABLET | Freq: Once | ORAL | Status: AC
  Administered 2023-08-26: 20 meq via ORAL
  Filled 2023-08-25: qty 1

## 2023-08-25 MED ORDER — TIZANIDINE HCL 4 MG PO TABS
8.0000 mg | ORAL_TABLET | Freq: Three times a day (TID) | ORAL | Status: DC | PRN
  Administered 2023-08-26 – 2023-08-27 (×2): 8 mg via ORAL
  Filled 2023-08-25 (×4): qty 2

## 2023-08-25 MED ORDER — PIPERACILLIN-TAZOBACTAM 3.375 G IVPB
3.3750 g | Freq: Three times a day (TID) | INTRAVENOUS | Status: DC
  Administered 2023-08-26 – 2023-08-27 (×3): 3.375 g via INTRAVENOUS
  Filled 2023-08-25 (×3): qty 50

## 2023-08-25 MED ORDER — SODIUM CHLORIDE 0.9 % IV SOLN: INTRAVENOUS | Status: AC

## 2023-08-25 MED ORDER — LIDOCAINE VISCOUS HCL 2 % MT SOLN
15.0000 mL | Freq: Once | OROMUCOSAL | Status: AC
  Administered 2023-08-25: 15 mL via ORAL
  Filled 2023-08-25: qty 15

## 2023-08-25 MED ORDER — HYDROMORPHONE HCL 1 MG/ML IJ SOLN
0.5000 mg | Freq: Once | INTRAMUSCULAR | Status: AC
  Administered 2023-08-25: 0.5 mg via INTRAVENOUS
  Filled 2023-08-25: qty 0.5

## 2023-08-25 MED ORDER — PANTOPRAZOLE SODIUM 40 MG PO TBEC
40.0000 mg | DELAYED_RELEASE_TABLET | Freq: Every day | ORAL | Status: DC
  Administered 2023-08-26 – 2023-08-27 (×2): 40 mg via ORAL
  Filled 2023-08-25 (×2): qty 1

## 2023-08-25 MED ORDER — HYDROMORPHONE HCL 1 MG/ML IJ SOLN
1.0000 mg | INTRAMUSCULAR | Status: DC | PRN
  Administered 2023-08-26 (×4): 1 mg via INTRAVENOUS
  Filled 2023-08-25 (×3): qty 1

## 2023-08-25 MED ORDER — SODIUM CHLORIDE 0.9 % IV BOLUS
1000.0000 mL | Freq: Once | INTRAVENOUS | Status: AC
  Administered 2023-08-25: 1000 mL via INTRAVENOUS

## 2023-08-25 MED ORDER — METOCLOPRAMIDE HCL 5 MG/ML IJ SOLN
10.0000 mg | Freq: Once | INTRAMUSCULAR | Status: AC
  Administered 2023-08-25: 10 mg via INTRAVENOUS
  Filled 2023-08-25: qty 2

## 2023-08-25 MED ORDER — ONDANSETRON HCL 4 MG/2ML IJ SOLN
4.0000 mg | Freq: Three times a day (TID) | INTRAMUSCULAR | Status: DC | PRN
  Administered 2023-08-26 (×2): 4 mg via INTRAVENOUS
  Filled 2023-08-25: qty 2

## 2023-08-25 MED ORDER — HEPARIN BOLUS VIA INFUSION
4000.0000 [IU] | Freq: Once | INTRAVENOUS | Status: AC
Start: 2023-08-25 — End: 2023-08-26
  Administered 2023-08-26: 4000 [IU] via INTRAVENOUS
  Filled 2023-08-25: qty 4000

## 2023-08-25 MED ORDER — HEPARIN (PORCINE) 25000 UT/250ML-% IV SOLN
1550.0000 [IU]/h | INTRAVENOUS | Status: DC
  Administered 2023-08-26: 1550 [IU]/h via INTRAVENOUS
  Filled 2023-08-25: qty 250

## 2023-08-25 MED ORDER — ALUM & MAG HYDROXIDE-SIMETH 200-200-20 MG/5ML PO SUSP
30.0000 mL | Freq: Once | ORAL | Status: AC
  Administered 2023-08-25: 30 mL via ORAL
  Filled 2023-08-25: qty 30

## 2023-08-25 MED ORDER — LOSARTAN POTASSIUM 50 MG PO TABS
100.0000 mg | ORAL_TABLET | Freq: Every day | ORAL | Status: DC
  Administered 2023-08-26 – 2023-08-27 (×2): 100 mg via ORAL
  Filled 2023-08-25 (×2): qty 2

## 2023-08-25 MED ORDER — ASPIRIN 325 MG PO TBEC: 325.0000 mg | DELAYED_RELEASE_TABLET | Freq: Four times a day (QID) | ORAL | Status: DC | PRN

## 2023-08-25 MED ORDER — PIPERACILLIN-TAZOBACTAM 3.375 G IVPB 30 MIN
3.3750 g | Freq: Once | INTRAVENOUS | Status: AC
  Administered 2023-08-26: 3.375 g via INTRAVENOUS
  Filled 2023-08-25 (×2): qty 50

## 2023-08-25 MED ORDER — LACTATED RINGERS IV BOLUS
1000.0000 mL | Freq: Once | INTRAVENOUS | Status: AC
  Administered 2023-08-25: 1000 mL via INTRAVENOUS

## 2023-08-25 MED ORDER — GADOBUTROL 1 MMOL/ML IV SOLN
9.0000 mL | Freq: Once | INTRAVENOUS | Status: AC | PRN
  Administered 2023-08-25: 9 mL via INTRAVENOUS

## 2023-08-25 MED ORDER — ONDANSETRON HCL 4 MG/2ML IJ SOLN
4.0000 mg | Freq: Once | INTRAMUSCULAR | Status: AC
  Administered 2023-08-25: 4 mg via INTRAVENOUS
  Filled 2023-08-25: qty 2

## 2023-08-25 NOTE — ED Triage Notes (Signed)
 Pt to ED from home [facility] for vomiting, upper abdominal pain and R and central chest pain since last night after eating. Has vomited about 5 times. Pt grimacing in triage. Skin dry, respirations unlabored. EKG done, blue top sent with labs.

## 2023-08-25 NOTE — ED Provider Notes (Signed)
 Care of this patient assumed from prior physician at 1500 pending CT dissection protocol, ERCP and disposition. Please see prior physician note for further details.  Briefly this is a 68 year old male presenting with abdominal pain and chest pain.  Recent admission for similar with negative stress test.  Presents back today with recurrent pain.  Labs with worsening transaminitis. CT dissection protocol resulted without evidence of acute aortic syndrome or other acute findings.  MRCP did demonstrate worsening biliary ductal dilatation with area of CBD stricture and multiple CBD stones measuring up to 1.7 cm.  These findings were reviewed with Dr. Norma Fredrickson with GI.  He did feel that patient warranted an ERCP for this.  He will contact Dr. Servando Snare for anticipated procedure tomorrow.  Agrees with hospitalist admission.  Will reach out to hospitalist team.  11:04 PM Reviewed with Dr. Clyde Lundborg.  He will evaluate for anticipated admission.   Trinna Post, MD 08/25/23 [phone]

## 2023-08-25 NOTE — ED Provider Notes (Signed)
 [facility] Provider Note    Event Date/Time   First MD Initiated Contact with Patient 08/25/23 1338     (approximate)   History   Chest Pain, Abdominal Pain, and Emesis   HPI  Earl Murray is a 68 y.o. male with history of hypertension, hyperlipidemia, chronic back pain, prior history of substance abuse on Suboxone, history of DVT on Eliquis, hepatitis C, presenting with abdominal pain and chest pain.  States that he ate some tacos yesterday, started having some periumbilical abdominal pain that progressed up to his chest.  States that he felt very lightheaded, had some shortness of breath this morning.  States that he had some associated nausea, vomiting.  States that he could not sleep all night.  He denies any diarrhea or urinary symptoms.  Also states that he had some blood on his tissue paper when wiping after bowel movement.  No blood in his bowel movement, no hematuria.  States that has been taking his blood thinners as prescribed.  On independent chart review, he was admitted at the end of March for chest pain and abdominal pain, had a CT PE study that was negative for PE, had a CT that showed renal cysts, prior cholecystectomy with moderate pneumobilia.  He also had a stress test that was done during that admission that was negative.  Patient states that the pain today is more intense but similar to the pain he has had on his prior admission.   Per EMS, patient was complained to them of vomiting, Pepcid abdominal pain as well as central chest pain.  Was given 324 of aspirin, blood glucose was 120.   Physical Exam   Triage Vital Signs: ED Triage Vitals  Encounter Vitals Group     BP 08/25/23 1151 (!) 147/91     Systolic BP Percentile --      Diastolic BP Percentile --      Pulse Rate 08/25/23 1151 97     Resp 08/25/23 1151 16     Temp 08/25/23 1151 98.5 F (36.9 C)     Temp Source 08/25/23 1151 Oral     SpO2 08/25/23 1151 98 %     Weight 08/25/23  1153 200 lb (90.7 kg)     Height 08/25/23 1153 6\' 5"  (1.956 m)     Head Circumference --      Peak Flow --      Pain Score 08/25/23 1151 10     Pain Loc --      Pain Education --      Exclude from Growth Chart --     Most recent vital signs: Vitals:   08/25/23 1151  BP: (!) 147/91  Pulse: 97  Resp: 16  Temp: 98.5 F (36.9 C)  SpO2: 98%     General: Awake, no distress.  CV:  Good peripheral perfusion.  Resp:  Normal effort.  Clear Abd:  No distention. Soft, diffusely tender without guarding Other:  No lower extremity edema, no focal weakness or numbness. Equal DP pulses bilaterally. Rectal exam was done with chaperone, no bloody mucosa, small amount of brown stool.  No external hemorrhoids.   ED Results / Procedures / Treatments   Labs (all labs ordered are listed, but only abnormal results are displayed) Labs Reviewed  BASIC METABOLIC PANEL WITH GFR - Abnormal; Notable for the following components:      Result Value   Potassium 3.4 (*)    Glucose, Bld 125 (*)  All other components within normal limits  CBC - Abnormal; Notable for the following components:   WBC 11.7 (*)    RDW 15.9 (*)    All other components within normal limits  HEPATIC FUNCTION PANEL - Abnormal; Notable for the following components:   AST 253 (*)    ALT 267 (*)    Alkaline Phosphatase 298 (*)    Total Bilirubin 2.0 (*)    Bilirubin, Direct 0.5 (*)    Indirect Bilirubin 1.5 (*)    All other components within normal limits  LIPASE, BLOOD  PROTIME-INR  APTT  BRAIN NATRIURETIC PEPTIDE  LACTIC ACID, PLASMA  LACTIC ACID, PLASMA  URINALYSIS, W/ REFLEX TO CULTURE (INFECTION SUSPECTED)  TROPONIN I (HIGH SENSITIVITY)  TROPONIN I (HIGH SENSITIVITY)     EKG  Sinus tachycardia, rate 101, normal QRS, normal QTc, T wave version to V2, no ischemic ST elevation, not significant compared to prior   RADIOLOGY Chest x-ray on my independent interpretation without obvious  consolidation.   PROCEDURES:  Critical Care performed: No  Procedures   MEDICATIONS ORDERED IN ED: Medications  iohexol (OMNIPAQUE) 350 MG/ML injection 100 mL (has no administration in time range)  alum & mag hydroxide-simeth (MAALOX/MYLANTA) 200-200-20 MG/5ML suspension 30 mL (30 mLs Oral Given 08/25/23 1405)    And  lidocaine (XYLOCAINE) 2 % viscous mouth solution 15 mL (15 mLs Oral Given 08/25/23 1405)  lactated ringers bolus 1,000 mL (1,000 mLs Intravenous New Bag/Given 08/25/23 1506)  ondansetron (ZOFRAN) injection 4 mg (4 mg Intravenous Given 08/25/23 1504)  HYDROmorphone (DILAUDID) injection 0.5 mg (0.5 mg Intravenous Given 08/25/23 1503)     IMPRESSION / MDM / ASSESSMENT AND PLAN / ED COURSE  I reviewed the triage vital signs and the nursing notes.                              Differential diagnosis includes, but is not limited to, ACS, angina, colitis, diverticulitis, gastroenteritis, GERD, peptic ulcer disease, also considered dissection, mesenteric ischemia, considered PE but patient had negative CT PE study recently, also is taking his blood thinners, consider viral illness.  For the blood on his tissue paper with wiping after bowel movement, no melanotic stool, no blood on rectal exam, considered hemorrhoid.  Will get labs, EKG, troponin, chest x-ray, CT dissection protocol.  Will give him a GI cocktail and reassess.  Patient's presentation is most consistent with acute presentation with potential threat to life or bodily function.  Independent review of labs and imaging are below.  LFTs are elevated, has prior history of cholecystectomy, and chronically dilated CBD, will get an MRCP to evaluate for choledocholithiasis.  Patient signed out pending CT and MRCP imaging results, reassessment.    Clinical Course as of 08/25/23 1511  Wed Aug 25, 2023  1357 DG Chest 2 View No acute cardiopulmonary findings.  [TT]  1357 Independent review of labs, troponin is not elevated, lipase is  normal, mild leukocytosis, H&H is stable, electrolytes not severely deranged, his alk phos tends to be elevated, AST and ALT were mildly elevated in the past, more elevated now. [TT]  1425 On reassessment patient states that he throughout the GI cocktail.  Will give him some Zofran as well as IV dilaudid. [TT]    Clinical Course User Index [TT] Jodie Echevaria Franchot Erichsen, MD     FINAL CLINICAL IMPRESSION(S) / ED DIAGNOSES   Final diagnoses:  Generalized abdominal pain  Chest pain, unspecified  type  Nausea and vomiting, unspecified vomiting type  Blood per rectum  Transaminitis     Rx / DC Orders   ED Discharge Orders     None        Note:  This document was prepared using Dragon voice recognition software and may include unintentional dictation errors.    Claybon Jabs, MD 08/25/23 [phone]

## 2023-08-25 NOTE — Progress Notes (Signed)
 Pharmacy Antibiotic Note  Earl Murray is a 68 y.o. male admitted on 08/25/2023 with posssible biliary infection.  Pharmacy has been consulted for Zosyn dosing.  Plan: Zosyn 3.375g IV q8h (4 hour infusion).  Pharmacy will continue to follow and will adjust abx dosing whenever warranted.  Temp (24hrs), Avg:98.9 F (37.2 C), Min:98.5 F (36.9 C), Max:99.2 F (37.3 C)   Recent Labs  Lab 08/19/23 1936 08/20/23 0821 08/25/23 1154 08/25/23 1454 08/25/23 1615 08/25/23 1924  WBC 16.9* 7.1 11.7*  --   --   --   CREATININE 1.12 1.15 1.22  --   --   --   LATICACIDVEN  --   --   --  2.8* 2.7* 1.4    Estimated Creatinine Clearance: 72 mL/min (by C-G formula based on SCr of 1.22 mg/dL).    Allergies  Allergen Reactions   Gabapentin Other (See Comments)    sedation   Toradol [Ketorolac Tromethamine] Itching    Per pt it makes him itch.    Motrin [Ibuprofen] Itching and Other (See Comments)   Tramadol Rash   Tylenol With Codeine #3 [Acetaminophen-Codeine] Itching    Antimicrobials this admission: 4/03 Zosyn >>   Microbiology results: 4/02 BCx: Pending  Thank you for allowing pharmacy to be a part of this patient's care.  Otelia Sergeant, PharmD, [facility] 08/25/2023 11:31 PM

## 2023-08-25 NOTE — ED Notes (Signed)
 Pt to MRI

## 2023-08-25 NOTE — H&P (Addendum)
 History and Physical    Earl Murray WUJ:[id number] DOB: [birth year] DOA: 08/25/2023  Referring MD/NP/PA:   PCP: [facility]   Patient coming from:  The patient is coming from home.     Chief Complaint: AP and chest pain  HPI: Earl Murray is a 68 y.o. male with medical history significant of s/p of cholecystectomy, HTN, HLD, prediabetes, GERD, depression, PE on Eliquis, OSA, former smoker, cocaine abuse in remission, pancreatitis, kidney stone, bladder cancer, chronic low back pain, choledocholithiasis, who presents with abdominal pain and chest pain.  Patient was recently hospitalized from 3/27 - 3/28 due to chest pain.  Patient had negative CTA for PE and negative stress test.  Patient states that he still has intermittent chest pain, which is located in the substernal area, sharp, 8 out of 10 in severity, nonradiating, pleuritic, aggravated by deep breath.  He has mild SOB, no cough, fever or chills.  Patient states since last night, he started having abdominal pain, which is located in the lower abdomen, constant, severe, aching, nonradiating, associated with nausea and multiple episodes of nonbilious nonbloody vomiting.  No diarrhea.  No symptoms of UTI.  No hematuria.  Patient took last dose of Eliquis this morning.  Data reviewed independently and ED Course: pt was found to have WBC 11.7, GFR> 60, potassium 3.4, abnormal liver function (ALP 298, AST 253, ALT 267, total bilirubin 2.0, direct improving 1.5), INR 1.0, PTT 25, troponin 10 --> 10, lactic acid 1.4 --> 2.8.  Chest x-ray negative.  CTA of chest/abdomen/pelvis negative for dissection.  Patient is admitted to telemetry bed as inpatient.  Dr. Norma Fredrickson of GI is consulted, who will inform Dr. Servando Snare for ERCP.  CTA o chest/abdomen/pelvis:  1. Examination is mildly degraded by extensive streak artifacts from thoracolumbar spinal fixation hardware. 2. No acute aortic syndrome. No acute thoracic aortic intramural hematoma. No  thoracoabdominal aortic aneurysm, dissection, penetrating atherosclerotic ulcer or vasculitis. 3. No acute inflammatory process identified within the chest, abdomen or pelvis. 4. Multiple other nonacute observations, as described above.   Aortic Atherosclerosis (ICD10-I70.0).  MRCP Prior cholecystectomy.   Diffuse biliary ductal dilatation with common bile duct measuring 20 mm, mildly increased since 2021 exam.   Smoothly tapered distal common bile duct stricture, with numerous new common bile duct calculi measuring up to 1.7 cm in diameter.      EKG: I have personally reviewed.  Sinus rhythm, QTc 425, with artificial effects  Review of Systems:   General: no fevers, chills, no body weight gain, has poor appetite, has fatigue HEENT: no blurry vision, hearing changes or sore throat Respiratory: has mild dyspnea, no coughing, wheezing CV: has chest pain, no palpitations GI: has nausea, vomiting, abdominal pain, no diarrhea, constipation GU: no dysuria, burning on urination, increased urinary frequency, hematuria  Ext: no leg edema Neuro: no unilateral weakness, numbness, or tingling, no vision change or hearing loss Skin: no rash, no skin tear. MSK: No muscle spasm, no deformity, no limitation of range of movement in spin Heme: No easy bruising.  Travel history: No recent long distant travel.   Allergy:  Allergies  Allergen Reactions   Gabapentin Other (See Comments)    sedation   Toradol [Ketorolac Tromethamine] Itching    Per pt it makes him itch.    Motrin [Ibuprofen] Itching and Other (See Comments)   Tramadol Rash   Tylenol With Codeine #3 [Acetaminophen-Codeine] Itching    Past Medical History:  Diagnosis Date   Bladder cancer (HCC)  Choledocholithiasis    Chronic kidney disease    Cocaine dependence in remission (HCC)    Concussion    Depression    DVT (deep venous thrombosis) (HCC)    GERD (gastroesophageal reflux disease)    Hepatitis C    History  of cancer    Hyperlipidemia    Hypertension    Migraine    Neck injury    Opioid dependence in remission (HCC)    Pancreatitis    Prediabetes    Scoliosis    Sleep apnea     Past Surgical History:  Procedure Laterality Date   BACK SURGERY     CHOLECYSTECTOMY     ELBOW SURGERY     ERCP N/A 10/27/2019   Procedure: ENDOSCOPIC RETROGRADE CHOLANGIOPANCREATOGRAPHY (ERCP);  Surgeon: Midge Minium, MD;  Location: [facility] ENDOSCOPY;  Service: Endoscopy;  Laterality: N/A;   FOOT SURGERY     HERNIA REPAIR     4 hernia repairs   PULMONARY THROMBECTOMY N/A 10/14/2021   Procedure: PULMONARY THROMBECTOMY;  Surgeon: Renford Dills, MD;  Location: [facility] INVASIVE CV LAB;  Service: Cardiovascular;  Laterality: N/A;    Social History:  reports that he quit smoking about 5 years ago. His smoking use included cigarettes. He has never used smokeless tobacco. He reports current alcohol use. He reports that he does not currently use drugs.  Family History:  Family History  Problem Relation Age of Onset   Hypertension Mother    Heart disease Mother    Colon cancer Father      Prior to Admission medications   Medication Sig Start Date End Date Taking? Authorizing Provider  amLODipine (NORVASC) 10 MG tablet Take 10 mg by mouth daily.     [provider]  apixaban (ELIQUIS) 5 MG TABS tablet Take 5 mg by mouth 2 (two) times daily. 12/08/22   [provider]  losartan (COZAAR) 100 MG tablet Take 100 mg by mouth daily. 08/19/23 11/17/23  [provider]  oxyCODONE 10 MG TABS Take 1 tablet (10 mg total) by mouth every 6 (six) hours as needed for severe pain (pain score 7-10). 08/20/23   Alford Highland, MD  pantoprazole (PROTONIX) 40 MG tablet Take 1 tablet (40 mg total) by mouth daily. 08/20/23 09/19/23  Alford Highland, MD  tiZANidine (ZANAFLEX) 4 MG tablet Take 2 tablets (8 mg total) by mouth daily as needed for muscle spasms. 08/20/23   Alford Highland, MD  gabapentin (NEURONTIN)  300 MG capsule gabapentin 300 mg capsule  one tab bid-tid  03/01/19  [provider]  ipratropium (ATROVENT) 0.06 % nasal spray Place 2 sprays into both nostrils 4 (four) times daily as needed for rhinitis. 10/22/19 03/16/20  Verlee Monte, NP  levocetirizine (XYZAL) 5 MG tablet Take 1 tablet (5 mg total) by mouth every evening. 09/30/19 03/16/20  Tommie Sams, DO    Physical Exam: Vitals:   08/25/23 1537 08/25/23 1625 08/25/23 1700 08/25/23 2044  BP: (!) 154/78  134/75 (!) 144/78  Pulse: (!) 58  62 (!) 56  Resp: 16  16 18   Temp:  99.2 F (37.3 C)    TempSrc:  Oral    SpO2: 98%  97% 98%  Weight:      Height:       General: Not in acute distress HEENT:       Eyes: PERRL, EOMI, no jaundice       ENT: No discharge from the ears and nose, no pharynx injection, no tonsillar enlargement.  Neck: No JVD, no bruit, no mass felt. Heme: No neck lymph node enlargement. Cardiac: S1/S2, RRR, No murmurs, No gallops or rubs. Respiratory: No rales, wheezing, rhonchi or rubs. GI: Soft, nondistended, has tenderness in the middle and lower abdomen, no rebound pain, no organomegaly, BS present. GU: No hematuria Ext: No pitting leg edema bilaterally. 1+DP/PT pulse bilaterally. Musculoskeletal: No joint deformities, No joint redness or warmth, no limitation of ROM in spin. Skin: No rashes.  Neuro: Alert, oriented X3, cranial nerves II-XII grossly intact, moves all extremities normally.  Psych: Patient is not psychotic, no suicidal or hemocidal ideation.  Labs on Admission: I have personally reviewed following labs and imaging studies  CBC: Recent Labs  Lab 08/19/23 1936 08/20/23 0821 08/25/23 1154  WBC 16.9* 7.1 11.7*  NEUTROABS 13.0*  --   --   HGB 14.6 13.0 14.0  HCT 42.2 38.4* 41.4  MCV 87.6 89.1 88.8  PLT 300 259 302   Basic Metabolic Panel: Recent Labs  Lab 08/19/23 1936 08/20/23 0821 08/25/23 1154  NA 139 138 143  K 3.5 3.6 3.4*  CL 103 102 103  CO2 26 28 28    GLUCOSE 104* 105* 125*  BUN 18 15 10   CREATININE 1.12 1.15 1.22  CALCIUM 9.6 9.1 9.4   GFR: Estimated Creatinine Clearance: 72 mL/min (by C-G formula based on SCr of 1.22 mg/dL). Liver Function Tests: Recent Labs  Lab 08/19/23 1936 08/20/23 0821 08/25/23 1155  AST 153* 94* 253*  ALT 169* 151* 267*  ALKPHOS 325* 258* 298*  BILITOT 2.1* 1.9* 2.0*  PROT 8.0 7.1 7.7  ALBUMIN 3.9 3.2* 3.6   Recent Labs  Lab 08/19/23 1936 08/25/23 1155  LIPASE 38 29   No results for input(s): "AMMONIA" in the last 168 hours. Coagulation Profile: Recent Labs  Lab 08/25/23 1155  INR 1.0   Cardiac Enzymes: No results for input(s): "CKTOTAL", "CKMB", "CKMBINDEX", "TROPONINI" in the last 168 hours. BNP (last 3 results) No results for input(s): "PROBNP" in the last 8760 hours. HbA1C: No results for input(s): "HGBA1C" in the last 72 hours. CBG: No results for input(s): "GLUCAP" in the last 168 hours. Lipid Profile: No results for input(s): "CHOL", "HDL", "LDLCALC", "TRIG", "CHOLHDL", "LDLDIRECT" in the last 72 hours. Thyroid Function Tests: No results for input(s): "TSH", "T4TOTAL", "FREET4", "T3FREE", "THYROIDAB" in the last 72 hours. Anemia Panel: No results for input(s): "VITAMINB12", "FOLATE", "FERRITIN", "TIBC", "IRON", "RETICCTPCT" in the last 72 hours. Urine analysis:    Component Value Date/Time   COLORURINE YELLOW (A) 08/25/2023 1925   APPEARANCEUR CLEAR (A) 08/25/2023 1925   LABSPEC >1.046 (H) 08/25/2023 1925   PHURINE 5.0 08/25/2023 1925   GLUCOSEU NEGATIVE 08/25/2023 1925   HGBUR NEGATIVE 08/25/2023 1925   BILIRUBINUR NEGATIVE 08/25/2023 1925   KETONESUR NEGATIVE 08/25/2023 1925   PROTEINUR NEGATIVE 08/25/2023 1925   NITRITE NEGATIVE 08/25/2023 1925   LEUKOCYTESUR NEGATIVE 08/25/2023 1925   Sepsis Labs: @LABRCNTIP (procalcitonin:4,lacticidven:4) )No results found for this or any previous visit (from the past 240 hours).   Radiological Exams on  Admission:   Assessment/Plan Principal Problem:   Choledocholithiasis Active Problems:   Chest pain   Hypertension   Elevated LFTs   Elevated lactic acid level   DVT (deep venous thrombosis) (HCC)   Personal history of pulmonary embolism   Assessment and Plan:  Choledocholithiasis: MRCP showed diffuse biliary ductal dilatation with common bile duct measuring 20 mm, mildly increased since 2021 exam, smoothly tapered distal common bile duct stricture, with numerous new common bile  duct calculi measuring up to 1.7 cm in diameter. Pt has mild leukocytosis with WBC 11.7, temperature 99.2.  No sepsis.  ED physician consulted with Dr. Norma Fredrickson of GI, who will inform Dr. Servando Snare for MRCP tomorrow.  -will admit to telemetry bed as inpatient -Started Zosyn empirically due to leukocytosis -Blood culture -Pain control: As needed Dilaudid and oxycodone -If patient develop fever, will use as needed aspirin for fever (patient is allergic to ibuprofen and Tylenol) -N.p.o. after midnight  Chest pain: Patient has pleuritic chest pain.  Recent CTA negative for PE.  CT of chest/pelvis/abdomen is negative for dissection today.  Troponin negative x 2. -Pain control as above -Supportive care  Hypertension -IV hydralazine as needed -Amlodipine, Cozaar  Elevated LFTs: Due to choledocholithiasis. -Avoid using Tylenol  Elevated lactic acid level: Lactic acid 1.4 --> 2.8.  Likely due to dehydration.  Clinically not septic. -IV fluid: 1 L LR, 1.5 L normal saline, then 100 cc/h of normal saline -Trend lactic acid level  Hx of DVT (deep venous thrombosis) (HCC) and Personal history of pulmonary embolism -Switch Eliquis to IV heparin  Hypokalemia: Potassium 3.4 -Repleted potassium -Check phosphorus and magnesium level    DVT ppx: on IV Heparin     Code Status: Full code    Family Communication:     not done, no family member is at bed side.     Disposition Plan:  Anticipate discharge back to  previous environment  Consults called:  Dr. Norma Fredrickson of GI is consulted, who will inform Dr. Servando Snare for ERCP  Admission status and Level of care: Telemetry Cardiac:   as inpt        Dispo: The patient is from: Home              Anticipated d/c is to: Home              Anticipated d/c date is: 2 days              Patient currently is not medically stable to d/c.    Severity of Illness:  The appropriate patient status for this patient is INPATIENT. Inpatient status is judged to be reasonable and necessary in order to provide the required intensity of service to ensure the patient's safety. The patient's presenting symptoms, physical exam findings, and initial radiographic and laboratory data in the context of their chronic comorbidities is felt to place them at high risk for further clinical deterioration. Furthermore, it is not anticipated that the patient will be medically stable for discharge from the hospital within 2 midnights of admission.   * I certify that at the point of admission it is my clinical judgment that the patient will require inpatient hospital care spanning beyond 2 midnights from the point of admission due to high intensity of service, high risk for further deterioration and high frequency of surveillance required.*       Date of Service 08/25/2023    Lorretta Harp [facility]   If 7PM-7AM, please contact night-coverage [email or web address] 08/25/2023, 11:41 PM

## 2023-08-25 NOTE — ED Triage Notes (Signed)
 First nurse note: Pt here via from home CP seen on here for 3/22. 12 lead unremarkable. Pain worse with inspiration.    140/93 98%  HR: 90  CBG120  324 ASA given by EMS

## 2023-08-25 NOTE — ED Notes (Signed)
This RN unsuccessful at IV

## 2023-08-25 NOTE — Progress Notes (Signed)
 ANTICOAGULATION CONSULT NOTE  Pharmacy Consult for heparin infusion Indication: pulmonary embolus  Allergies  Allergen Reactions   Gabapentin Other (See Comments)    sedation   Toradol [Ketorolac Tromethamine] Itching    Per pt it makes him itch.    Motrin [Ibuprofen] Itching and Other (See Comments)   Tramadol Rash   Tylenol With Codeine #3 [Acetaminophen-Codeine] Itching    Patient Measurements: Height: 6\' 5"  (195.6 cm) Weight: 90.7 kg (200 lb) IBW/kg (Calculated) : 89.1 Heparin Dosing Weight: 90.7 kg  Vital Signs: Temp: 99.2 F (37.3 C) (04/02 1625) Temp Source: Oral (04/02 1625) BP: 144/78 (04/02 2044) Pulse Rate: 56 (04/02 2044)  Labs: Recent Labs    08/25/23 1154 08/25/23 1155 08/25/23 1454  HGB 14.0  --   --   HCT 41.4  --   --   PLT 302  --   --   APTT  --  25  --   LABPROT  --  13.6  --   INR  --  1.0  --   CREATININE 1.22  --   --   TROPONINIHS 10  --  10    Estimated Creatinine Clearance: 72 mL/min (by C-G formula based on SCr of 1.22 mg/dL).   Medical History: Past Medical History:  Diagnosis Date   Bladder cancer (HCC)    Choledocholithiasis    Chronic kidney disease    Cocaine dependence in remission (HCC)    Concussion    Depression    DVT (deep venous thrombosis) (HCC)    GERD (gastroesophageal reflux disease)    Hepatitis C    History of cancer    Hyperlipidemia    Hypertension    Migraine    Neck injury    Opioid dependence in remission (HCC)    Pancreatitis    Prediabetes    Scoliosis    Sleep apnea     Medications:  PTA Meds: Eliquis 5 mg BID, last dose unknown  Assessment: Pt is a 68 yo male w/ h/o DVT on Eliquis presenting to ED c/o vomiting, upper abdominal pain, right/central CP, worse with inspiration, being transitioned to heparin for likely upcoming procedure.  Goal of Therapy:  Heparin level 0.3-0.7 units/ml aPTT 66-102 seconds Monitor platelets by anticoagulation protocol: Yes   Plan:  D/T unknown last  dose of Apixaban, will bolus 4000 units x 1 Start heparin infusion at 1550 units/hr Will follow aPTT until correlation w/ HL confirmed Will check aPTT in 6 hr after start of infusion HL & CBC daily while on heparin  Otelia Sergeant, PharmD, [facility] 08/25/2023 11:32 PM

## 2023-08-26 ENCOUNTER — Inpatient Hospital Stay: Admitting: Anesthesiology

## 2023-08-26 ENCOUNTER — Encounter: Payer: Self-pay | Admitting: Internal Medicine

## 2023-08-26 ENCOUNTER — Inpatient Hospital Stay

## 2023-08-26 ENCOUNTER — Encounter
Admission: EM | Disposition: A | Discharge status: Home / Self Care | Payer: Self-pay | Source: Home / Self Care | Attending: Emergency Medicine

## 2023-08-26 DIAGNOSIS — K838 Other specified diseases of biliary tract: Secondary | ICD-10-CM | POA: Diagnosis not present

## 2023-08-26 DIAGNOSIS — R7401 Elevation of levels of liver transaminase levels: Secondary | ICD-10-CM

## 2023-08-26 DIAGNOSIS — K8050 Calculus of bile duct without cholangitis or cholecystitis without obstruction: Secondary | ICD-10-CM | POA: Diagnosis not present

## 2023-08-26 HISTORY — PX: ERCP: SHX5425

## 2023-08-26 LAB — CBC
HCT: 34.2 % — ABNORMAL LOW (ref 39.0–52.0)
Hemoglobin: 11.6 g/dL — ABNORMAL LOW (ref 13.0–17.0)
MCH: 30.9 pg (ref 26.0–34.0)
MCHC: 33.9 g/dL (ref 30.0–36.0)
MCV: 91 fL (ref 80.0–100.0)
Platelets: 243 10*3/uL (ref 150–400)
RBC: 3.76 MIL/uL — ABNORMAL LOW (ref 4.22–5.81)
RDW: 15.8 % — ABNORMAL HIGH (ref 11.5–15.5)
WBC: 8.5 10*3/uL (ref 4.0–10.5)
nRBC: 0 % (ref 0.0–0.2)

## 2023-08-26 LAB — APTT: aPTT: 95 s — ABNORMAL HIGH (ref 24–36)

## 2023-08-26 LAB — MAGNESIUM: Magnesium: 2 mg/dL (ref 1.7–2.4)

## 2023-08-26 LAB — COMPREHENSIVE METABOLIC PANEL WITH GFR
ALT: 161 U/L — ABNORMAL HIGH (ref 0–44)
AST: 95 U/L — ABNORMAL HIGH (ref 15–41)
Albumin: 3.1 g/dL — ABNORMAL LOW (ref 3.5–5.0)
Alkaline Phosphatase: 227 U/L — ABNORMAL HIGH (ref 38–126)
Anion gap: 7 (ref 5–15)
BUN: 11 mg/dL (ref 8–23)
CO2: 29 mmol/L (ref 22–32)
Calcium: 8.7 mg/dL — ABNORMAL LOW (ref 8.9–10.3)
Chloride: 105 mmol/L (ref 98–111)
Creatinine, Ser: 1.14 mg/dL (ref 0.61–1.24)
GFR, Estimated: >60 mL/min (ref >60)
Glucose, Bld: 97 mg/dL (ref 70–99)
Potassium: 3.9 mmol/L (ref 3.5–5.1)
Sodium: 141 mmol/L (ref 135–145)
Total Bilirubin: 1.7 mg/dL — ABNORMAL HIGH (ref 0.0–1.2)
Total Protein: 6.3 g/dL — ABNORMAL LOW (ref 6.5–8.1)

## 2023-08-26 LAB — PHOSPHORUS: Phosphorus: 3 mg/dL (ref 2.5–4.6)

## 2023-08-26 LAB — HEPARIN LEVEL (UNFRACTIONATED): Heparin Unfractionated: 0.64 [IU]/mL (ref 0.30–0.70)

## 2023-08-26 SURGERY — ERCP, WITH INTERVENTION IF INDICATED
Anesthesia: General

## 2023-08-26 MED ORDER — PROPOFOL 10 MG/ML IV BOLUS
INTRAVENOUS | Status: DC | PRN
  Administered 2023-08-26: 200 mg via INTRAVENOUS
  Administered 2023-08-26 (×2): 100 mg via INTRAVENOUS

## 2023-08-26 MED ORDER — APIXABAN 5 MG PO TABS
5.0000 mg | ORAL_TABLET | Freq: Two times a day (BID) | ORAL | Status: DC
  Administered 2023-08-26 – 2023-08-27 (×2): 5 mg via ORAL
  Filled 2023-08-26 (×2): qty 1

## 2023-08-26 MED ORDER — DICLOFENAC SUPPOSITORY 100 MG: 100.0000 mg | Freq: Once | RECTAL | Status: DC

## 2023-08-26 MED ORDER — LACTATED RINGERS IV SOLN: INTRAVENOUS | Status: DC | PRN

## 2023-08-26 MED ORDER — ROCURONIUM BROMIDE 10 MG/ML (PF) SYRINGE
PREFILLED_SYRINGE | INTRAVENOUS | Status: AC
  Filled 2023-08-26: qty 10

## 2023-08-26 MED ORDER — HYDROMORPHONE HCL 1 MG/ML IJ SOLN
INTRAMUSCULAR | Status: AC
  Filled 2023-08-26: qty 0.5

## 2023-08-26 MED ORDER — FENTANYL CITRATE (PF) 100 MCG/2ML IJ SOLN
INTRAMUSCULAR | Status: AC
Start: 2023-08-26 — End: ?
  Filled 2023-08-26: qty 2

## 2023-08-26 MED ORDER — DEXAMETHASONE SODIUM PHOSPHATE 10 MG/ML IJ SOLN
INTRAMUSCULAR | Status: DC | PRN
  Administered 2023-08-26: 5 mg via INTRAVENOUS

## 2023-08-26 MED ORDER — SUCCINYLCHOLINE CHLORIDE 200 MG/10ML IV SOSY
PREFILLED_SYRINGE | INTRAVENOUS | Status: DC | PRN
  Administered 2023-08-26: 100 mg via INTRAVENOUS

## 2023-08-26 MED ORDER — OXYCODONE HCL 5 MG PO TABS
10.0000 mg | ORAL_TABLET | Freq: Four times a day (QID) | ORAL | Status: DC | PRN
  Administered 2023-08-26 – 2023-08-27 (×3): 10 mg via ORAL
  Filled 2023-08-26 (×3): qty 2

## 2023-08-26 MED ORDER — EPHEDRINE 5 MG/ML INJ
INTRAVENOUS | Status: AC
  Filled 2023-08-26: qty 5

## 2023-08-26 MED ORDER — LIDOCAINE HCL (CARDIAC) PF 100 MG/5ML IV SOSY
PREFILLED_SYRINGE | INTRAVENOUS | Status: DC | PRN
  Administered 2023-08-26: 100 mg via INTRAVENOUS

## 2023-08-26 MED ORDER — FENTANYL CITRATE (PF) 100 MCG/2ML IJ SOLN
INTRAMUSCULAR | Status: DC | PRN
  Administered 2023-08-26: 100 ug via INTRAVENOUS

## 2023-08-26 MED ORDER — LIDOCAINE HCL (PF) 2 % IJ SOLN
INTRAMUSCULAR | Status: AC
  Filled 2023-08-26: qty 5

## 2023-08-26 MED ORDER — GLYCOPYRROLATE 0.2 MG/ML IJ SOLN
INTRAMUSCULAR | Status: DC | PRN
  Administered 2023-08-26: .2 mg via INTRAVENOUS

## 2023-08-26 MED ORDER — DICLOFENAC SUPPOSITORY 100 MG
RECTAL | Status: AC
  Filled 2023-08-26: qty 1

## 2023-08-26 MED ORDER — PROPOFOL 10 MG/ML IV BOLUS
INTRAVENOUS | Status: AC
Start: 2023-08-26 — End: ?
  Filled 2023-08-26: qty 20

## 2023-08-26 NOTE — Care Management Obs Status (Signed)
 MEDICARE OBSERVATION STATUS NOTIFICATION   Patient Details  Name: Earl Murray MRN: [id number] Date of Birth: 1956-04-15   Medicare Observation Status Notification Given:  Yes    [place] Mally Gavina, LCSW 08/26/2023, 6:51 PM

## 2023-08-26 NOTE — Op Note (Signed)
 [facility] Gastroenterology Patient Name: Earl Murray Procedure Date: 08/26/2023 3:04 PM MRN: [id number] Account #: [id number] Date of Birth: 13-Jan-1956 Admit Type: Inpatient Age: 68 Room: [facility] ENDO ROOM 4 Gender: Male Note Status: Finalized Instrument Name: [id number] Procedure:             ERCP Indications:           Bile duct stone(s) Providers:             Midge Minium MD, MD Referring MD:          No Local Md, MD (Referring MD) Medicines:             General Anesthesia Complications:         No immediate complications. Procedure:             Pre-Anesthesia Assessment:                        - Prior to the procedure, a History and Physical was                         performed, and patient medications and allergies were                         reviewed. The patient's tolerance of previous                         anesthesia was also reviewed. The risks and benefits                         of the procedure and the sedation options and risks                         were discussed with the patient. All questions were                         answered, and informed consent was obtained. Prior                         Anticoagulants: The patient has taken no anticoagulant                         or antiplatelet agents. ASA Grade Assessment: II - A                         patient with mild systemic disease. After reviewing                         the risks and benefits, the patient was deemed in                         satisfactory condition to undergo the procedure.                        After obtaining informed consent, the scope was passed                         under direct vision. Throughout the procedure, the  patient's blood pressure, pulse, and oxygen                         saturations were monitored continuously. The                         Duodenoscope was introduced through the mouth, and                         used to inject  contrast into and used to inject                         contrast into the bile duct. The ERCP was accomplished                         without difficulty. The patient tolerated the                         procedure well. Findings:      The scout film was normal. The esophagus was successfully intubated       under direct vision without detailed examination of the pharynx, larynx,       and associated structures. A biliary sphincterotomy had been performed.       The sphincterotomy appeared open. The bile duct was deeply cannulated       with the short-nosed traction sphincterotome. Contrast was injected. I       personally interpreted the bile duct images. There was brisk flow of       contrast through the ducts. Image quality was excellent. Contrast       extended to the entire biliary tree. The main bile duct, common hepatic       duct and right and left intrahepatic branches (but not the right or left       hepatic ducts) were dilated. A wire was passed into the biliary tree.       The biliary tree was swept with a 15 mm balloon starting at the       bifurcation. Sludge was swept from the duct. Many stones were removed.       No stones remained. Impression:            - Prior biliary sphincterotomy appeared open.                        - The entire main bile duct, right and left                         intrahepatic branches (but not the right or left                         hepatic ducts) and common hepatic duct were dilated.                        - Choledocholithiasis was found. Complete removal was                         accomplished by balloon extraction.                        - The biliary tree was swept.  Recommendation:        - Return patient to hospital ward for ongoing care.                        - Resume previous diet.                        - Watch for pancreatitis, bleeding, perforation, and                         cholangitis. Procedure Code(s):     --- Professional  ---                        [phone], Endoscopic retrograde cholangiopancreatography                         (ERCP); with removal of calculi/debris from                         biliary/pancreatic duct(s)                        [phone], Endoscopic catheterization of the biliary                         ductal system, radiological supervision and                         interpretation Diagnosis Code(s):     --- Professional ---                        K80.50, Calculus of bile duct without cholangitis or                         cholecystitis without obstruction CPT copyright 2022 American [employer] Association. All rights reserved. The codes documented in this report are preliminary and upon coder review may  be revised to meet current compliance requirements. Midge Minium MD, MD 08/26/2023 4:02:50 PM This report has been signed electronically. Number of Addenda: 0 Note Initiated On: 08/26/2023 3:04 PM Estimated Blood Loss:  Estimated blood loss: none.      [facility]

## 2023-08-26 NOTE — TOC Initial Note (Signed)
 Transition of Care [facility]) - Initial/Assessment Note    Patient Details  Name: Earl Murray MRN: [id number] Date of Birth: 05/14/1956  [facility] CM/SW Contact:    Colin Broach, LCSW Phone Number: 08/26/2023, 1:26 PM  Clinical Narrative:  CSW met with patient at bedside to complete readmission prevention assessment screening.  CSW introduced self and reason for visit. Patient is amenable to answering questions.  Patient's PCP is [facility] and he gets his medications from there.  He also uses [facility] in [place].  Patient doesn't have any concerns with paying for his meds.  He lives in the home with his wife, Jabes Primo [phone]) and daughter.  Patient has the following DME:  cane, shower chair and grab bars.  At discharge, his wife will transport him home. TOC to continue to follow.                       Patient Goals and CMS Choice            Expected Discharge Plan and Services                                              Prior Living Arrangements/Services                       Activities of Daily Living   ADL Screening (condition at time of admission) Independently performs ADLs?: Yes (appropriate for developmental age) Is the patient deaf or have difficulty hearing?: No Does the patient have difficulty seeing, even when wearing glasses/contacts?: No Does the patient have difficulty concentrating, remembering, or making decisions?: No  Permission Sought/Granted                  Emotional Assessment              Admission diagnosis:  Choledocholithiasis [K80.50] Patient Active Problem List   Diagnosis Date Noted   Chest pain 08/25/2023   Elevated lactic acid level 08/25/2023   Precordial chest pain 08/19/2023   Frequent falls 08/19/2023   Polyneuropathy 08/19/2023   Elevated LFTs 08/19/2023   History of cholecystectomy 08/19/2023   History of hepatitis C 08/19/2023   History of fusion of lumbar spine 08/19/2023    Long term (current) use of anticoagulants 02/16/2023   Personal history of pulmonary embolism 02/16/2023   Pain of lower extremity    Pulmonary embolism (HCC)    Dyslipidemia 10/13/2021   GERD without esophagitis 10/13/2021   Hypertensive urgency 10/13/2021   Acute pulmonary embolism (HCC) 10/12/2021   Allergic rhinitis 04/25/2021   Bladder cancer (HCC) 04/25/2021   Closed fracture of metatarsal bone 04/25/2021   Generalized abdominal pain 04/25/2021   Gross hematuria 04/25/2021   Lateral epicondylitis of elbow 04/25/2021   Major depressive disorder, recurrent, unspecified (HCC) 04/25/2021   Knee pain 04/25/2021   Migraine 04/25/2021   Cervicalgia 04/25/2021   Neuroma of foot 04/25/2021   Opioid dependence in remission (HCC) 04/25/2021   Encounter for screening for malignant neoplasm of respiratory organs 04/25/2021   Pain, unspecified 04/25/2021   Pancreatitis 04/25/2021   Paresthesia of skin 04/25/2021   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   Sciatica 04/25/2021   Syncope and collapse 04/25/2021   Tobacco use 04/25/2021   Diarrhea, unspecified 03/25/2021   Degeneration of lumbosacral intervertebral disc 03/25/2021  DDD (degenerative disc disease), cervical 03/25/2021   Chronic sinusitis 03/25/2021   Family history of malignant neoplasm of digestive organs 03/25/2021   Gallbladder disease 03/25/2021   DVT (deep venous thrombosis) (HCC) 03/25/2021   Prediabetes 02/05/2021   Stage 3a chronic kidney disease (HCC) 02/05/2021   Cocaine dependence in remission (HCC) 01/31/2021   Hyperlipidemia 01/31/2021   Obstructive sleep apnea 01/31/2021   Other and unspecified alcohol dependence, in remission 01/31/2021   Other, mixed, or unspecified nondependent drug abuse, unspecified 01/31/2021   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/2021   Scoliosis of thoracic spine 01/31/2021   Abnormal magnetic resonance imaging of liver    Choledocholithiasis 10/25/2019   Hypertension    Lumbar  radiculopathy 09/15/2018   Displacement of lumbar intervertebral disc without myelopathy 09/12/2018   Cervical radiculitis 09/12/2018   Hallux valgus, acquired 04/20/2016   Hammer toes of both feet 04/20/2016   Stress reaction 04/20/2016   Chronic back pain 05/25/1998   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   PCP:  [facility]:   [id number] [place], [place] - [address] AT [facility] [address] [place] [address] Phone: [phone] Fax: [phone]     Social Drivers of Health (SDOH) Social History: SDOH Screenings   Food Insecurity: Food Insecurity Present (08/26/2023)  Housing: Low Risk  (08/26/2023)  Transportation Needs: No Transportation Needs (08/26/2023)  Recent Concern: Transportation Needs - Unmet Transportation Needs (08/18/2023)   Received from [facility]  Utilities: Not At Risk (08/26/2023)  Recent Concern: Utilities - At Risk (08/18/2023)   Received from [facility]  Financial Resource Strain: High Risk (08/18/2023)   Received from [facility]  Social Connections: Moderately Integrated (08/26/2023)  Tobacco Use: Medium Risk (08/26/2023)   SDOH Interventions:     Readmission Risk Interventions    08/26/2023    1:26 PM 10/13/2021   12:23 PM  Readmission Risk Prevention Plan  Transportation Screening Complete Complete  PCP or Specialist Appt within 3-5 Days Complete Complete  HRI or Home Care Consult Complete   Social Work Consult for Recovery Care Planning/Counseling Complete Complete  Palliative Care Screening Not Applicable Not Applicable  Medication Review Oceanographer) Complete Complete

## 2023-08-26 NOTE — Anesthesia Procedure Notes (Signed)
 Procedure Name: Intubation Date/Time: 08/26/2023 3:24 PM  Performed by: Darrell Jewel I, CRNAPre-anesthesia Checklist: Patient identified, Patient being monitored, Timeout performed, Emergency Drugs available and Suction available Patient Re-evaluated:Patient Re-evaluated prior to induction Oxygen Delivery Method: Circle system utilized Preoxygenation: Pre-oxygenation with 100% oxygen Induction Type: IV induction Ventilation: Mask ventilation without difficulty Laryngoscope Size: McGrath and 4 Grade View: Grade I Tube type: Oral Tube size: 7.5 mm Number of attempts: 1 Airway Equipment and Method: Stylet Placement Confirmation: ETT inserted through vocal cords under direct vision, positive ETCO2 and breath sounds checked- equal and bilateral Secured at: 21 cm Tube secured with: Tape Dental Injury: Teeth and Oropharynx as per pre-operative assessment

## 2023-08-26 NOTE — Discharge Summary (Signed)
 Physician Discharge Summary  Patient: Earl Murray UJW:[id number] DOB: [birth year]   Code Status: Full Code Admit date: 08/25/2023 Discharge date: 08/27/2023 Disposition: Home, No home health services recommended PCP: [facility]  Recommendations for Outpatient Follow-up:  Follow up with PCP within 1-2 weeks Regarding general hospital follow up and preventative care Recommend CMP, CBC Follow up with GI  Discharge Diagnoses:  Principal Problem:   Choledocholithiasis Active Problems:   Chest pain   Hypertension   Elevated LFTs   Elevated lactic acid level   DVT (deep venous thrombosis) (HCC)   Personal history of pulmonary embolism   Transaminitis   Nausea and vomiting  Brief Hospital Course Summary: Earl Murray is a 68 y.o. male with medical history significant of s/p of cholecystectomy, HTN, HLD, prediabetes, GERD, depression, PE on Eliquis, OSA, former smoker, cocaine abuse in remission, pancreatitis, kidney stone, bladder cancer, chronic low back pain, choledocholithiasis, who presents with abdominal pain and chest pain.   Patient was recently hospitalized from 3/27 - 3/28 due to chest pain.  Patient had negative CTA for PE and negative stress test.  Patient states that he still has intermittent chest pain, which is located in the substernal area, sharp, 8 out of 10 in severity, nonradiating, pleuritic, aggravated by deep breath.  He has mild SOB, no cough, fever or chills.  Patient states since last night, he started having abdominal pain, which is located in the lower abdomen, constant, severe, aching, nonradiating, associated with nausea and multiple episodes of nonbilious nonbloody vomiting.  No diarrhea.  No symptoms of UTI.  No hematuria.  Patient took last dose of Eliquis this morning.   ED Course: pt was found to have WBC 11.7, GFR> 60, potassium 3.4, abnormal liver function (ALP 298, AST 253, ALT 267, total bilirubin 2.0, direct improving 1.5), INR 1.0, PTT 25,  troponin 10 --> 10, lactic acid 1.4 --> 2.8.  Chest x-ray negative.  CTA of chest/abdomen/pelvis negative for dissection.     MRCP- Diffuse biliary ductal dilatation with common bile duct measuring 20 mm, mildly increased since 2021 exam. Smoothly tapered distal common bile duct stricture, with numerous new common bile duct calculi measuring up to 1.7 cm in diameter.    GI consulted and completed ERCP with removal of stones and resulted in patent CBD. Patient tolerated procedure well. Wants to stay overnight due to upset stomach.   Following day- his abdominal pain resolved and he has been able to tolerate a normal diet.   All other chronic conditions were treated with home medications.    Discharge Condition: Good, improved Recommended discharge diet: Regular healthy diet  Consultations: GI  Procedures/Studies: MRCP ERCP  Allergies as of 08/27/2023       Reactions   Gabapentin Other (See Comments)   sedation   Toradol [ketorolac Tromethamine] Itching   Per pt it makes him itch.    Motrin [ibuprofen] Itching, Other (See Comments)   Tramadol Rash   Tylenol With Codeine #3 [acetaminophen-codeine] Itching        Medication List     STOP taking these medications    pantoprazole 40 MG tablet Commonly known as: Protonix       TAKE these medications    amLODipine 10 MG tablet Commonly known as: NORVASC Take 10 mg by mouth daily.   apixaban 5 MG Tabs tablet Commonly known as: ELIQUIS Take 5 mg by mouth 2 (two) times daily.   hydrOXYzine 25 MG tablet Commonly known as: ATARAX Take  25 mg by mouth 2 (two) times daily as needed for anxiety.   losartan 100 MG tablet Commonly known as: COZAAR Take 100 mg by mouth daily.   Oxycodone HCl 10 MG Tabs Take 1 tablet (10 mg total) by mouth every 6 (six) hours as needed for up to 5 days. What changed: reasons to take this   PARoxetine 40 MG tablet Commonly known as: PAXIL Take 20 mg by mouth every morning.    tiZANidine 4 MG tablet Commonly known as: ZANAFLEX Take 2 tablets (8 mg total) by mouth daily as needed for muscle spasms.        Follow-up Information     [facility]. Call today.   Specialty: General Practice Why: To follow up Contact information: [address] [place] [address] [phone]                Subjective   Pt reports feeling no abdominal pain. Had no difficulty with breakfast. Ambulating independently.  All questions and concerns were addressed at time of discharge.  Objective  Blood pressure (!) 145/76, pulse (!) 48, temperature (!) 96.5 F (35.8 C), temperature source Temporal, resp. rate 16, height 6\' 5"  (1.956 m), weight 90.7 kg, SpO2 100%.   General: Pt is alert, awake, not in acute distress Cardiovascular: RRR, S1/S2 +, no rubs, no gallops Respiratory: CTA bilaterally, no wheezing, no rhonchi Abdominal: Soft, NT, ND, bowel sounds + Extremities: no edema, no cyanosis  The results of significant diagnostics from this hospitalization (including imaging, microbiology, ancillary and laboratory) are listed below for reference.   Imaging studies: DG C-Arm 1-60 Min-No Report Result Date: 08/26/2023 Fluoroscopy was utilized by the requesting physician.  No radiographic interpretation.   MR ABDOMEN MRCP W WO CONTAST Result Date: 08/25/2023 CLINICAL DATA:  Abdominal pain.  Elevated liver function tests. EXAM: MRI ABDOMEN WITHOUT AND WITH CONTRAST (INCLUDING MRCP) TECHNIQUE: Multiplanar multisequence MR imaging of the abdomen was performed both before and after the administration of intravenous contrast. Heavily T2-weighted images of the biliary and pancreatic ducts were obtained, and three-dimensional MRCP images were rendered by post processing. CONTRAST:  9mL GADAVIST GADOBUTROL 1 MMOL/ML IV SOLN COMPARISON:  11/25/2019 FINDINGS: Lower chest: No acute findings. Hepatobiliary: No hepatic masses identified. Prior cholecystectomy. Diffuse biliary  ductal dilatation shows mild increase since prior MRI in 2021, with common bile duct measuring 20 mm in diameter. Smoothly tapered distal common bile duct stricture is seen near the ampulla. Numerous new calculi are seen throughout the common bile duct, measuring up to 1.7 cm in diameter. Low cystic duct insertion into the distal common bile duct also noted. Pancreas: No mass or inflammatory changes. No evidence of pancreatic ductal dilatation. Spleen:  Within normal limits in size and appearance. Adrenals/Urinary Tract: Benign Bosniak category 1 renal cysts are again seen bilaterally (No followup imaging is recommended). No suspicious masses identified. No evidence of hydronephrosis. Stomach/Bowel: Unremarkable. Vascular/Lymphatic: No pathologically enlarged lymph nodes identified. No acute vascular findings. Other:  None. Musculoskeletal:  No suspicious bone lesions identified. IMPRESSION: Prior cholecystectomy. Diffuse biliary ductal dilatation with common bile duct measuring 20 mm, mildly increased since 2021 exam. Smoothly tapered distal common bile duct stricture, with numerous new common bile duct calculi measuring up to 1.7 cm in diameter. Electronically Signed   By: Danae Orleans M.D.   On: 08/25/2023 22:01   MR 3D Recon At Scanner Result Date: 08/25/2023 CLINICAL DATA:  Abdominal pain.  Elevated liver function tests. EXAM: MRI ABDOMEN WITHOUT AND WITH  CONTRAST (INCLUDING MRCP) TECHNIQUE: Multiplanar multisequence MR imaging of the abdomen was performed both before and after the administration of intravenous contrast. Heavily T2-weighted images of the biliary and pancreatic ducts were obtained, and three-dimensional MRCP images were rendered by post processing. CONTRAST:  9mL GADAVIST GADOBUTROL 1 MMOL/ML IV SOLN COMPARISON:  11/25/2019 FINDINGS: Lower chest: No acute findings. Hepatobiliary: No hepatic masses identified. Prior cholecystectomy. Diffuse biliary ductal dilatation shows mild increase since  prior MRI in 2021, with common bile duct measuring 20 mm in diameter. Smoothly tapered distal common bile duct stricture is seen near the ampulla. Numerous new calculi are seen throughout the common bile duct, measuring up to 1.7 cm in diameter. Low cystic duct insertion into the distal common bile duct also noted. Pancreas: No mass or inflammatory changes. No evidence of pancreatic ductal dilatation. Spleen:  Within normal limits in size and appearance. Adrenals/Urinary Tract: Benign Bosniak category 1 renal cysts are again seen bilaterally (No followup imaging is recommended). No suspicious masses identified. No evidence of hydronephrosis. Stomach/Bowel: Unremarkable. Vascular/Lymphatic: No pathologically enlarged lymph nodes identified. No acute vascular findings. Other:  None. Musculoskeletal:  No suspicious bone lesions identified. IMPRESSION: Prior cholecystectomy. Diffuse biliary ductal dilatation with common bile duct measuring 20 mm, mildly increased since 2021 exam. Smoothly tapered distal common bile duct stricture, with numerous new common bile duct calculi measuring up to 1.7 cm in diameter. Electronically Signed   By: Danae Orleans M.D.   On: 08/25/2023 22:01   CT Angio Chest/Abd/Pel for Dissection W and/or W/WO Result Date: 08/25/2023 CLINICAL DATA:  Acute aortic syndrome (AAS) suspected abd pain, also considered mesenteric ischemia. Upper abdominal pain and right and central chest pain. EXAM: CT ANGIOGRAPHY CHEST, ABDOMEN AND PELVIS TECHNIQUE: Non-contrast CT of the chest was initially obtained. Multidetector CT imaging through the chest, abdomen and pelvis was performed using the standard protocol during bolus administration of intravenous contrast. Multiplanar reconstructed images and MIPs were obtained and reviewed to evaluate the vascular anatomy. RADIATION DOSE REDUCTION: This exam was performed according to the departmental dose-optimization program which includes automated exposure control,  adjustment of the mA and/or kV according to patient size and/or use of iterative reconstruction technique. CONTRAST:  OMNIPAQUE IOHEXOL 350 MG/ML SOLN COMPARISON:  CT Angiography chest and CT scan abdomen and pelvis from 08/19/2023. FINDINGS: CTA CHEST FINDINGS Cardiovascular: No intramural hematoma noted in the thoracic aorta on the unenhanced images. Thoracic aorta is normal in caliber without aneurysm, dissection, vasculitis or significant stenosis. There is also satisfactory opacification of pulmonary arteries. No embolism seen to the proximal subsegmental pulmonary artery level. Normal cardiac size. No pericardial effusion. No aortic aneurysm. There are coronary artery calcifications, in keeping with coronary artery disease. There are also mild peripheral atherosclerotic vascular calcifications of thoracic aorta and its major branches. Mediastinum/Nodes: Visualized thyroid gland appears grossly unremarkable. No solid / cystic mediastinal masses. The esophagus is nondistended precluding optimal assessment. No axillary, mediastinal or hilar lymphadenopathy by size criteria. Lungs/Pleura: The central tracheo-bronchial tree is patent. No mass or consolidation. No pleural effusion or pneumothorax. No suspicious lung nodules. Musculoskeletal: The visualized soft tissues of the chest wall are grossly unremarkable. No suspicious osseous lesions. There are mild multilevel degenerative changes in the visualized spine. Review of the MIP images confirms the above findings. CTA ABDOMEN AND PELVIS FINDINGS VASCULAR Aorta: Normal caliber aorta without aneurysm, dissection, vasculitis or significant stenosis. Celiac: Patent without evidence of aneurysm, dissection, vasculitis or significant stenosis. SMA: Patent without evidence of aneurysm, dissection, vasculitis or  significant stenosis. Renals: Both renal arteries are patent without evidence of aneurysm, dissection, vasculitis, fibromuscular dysplasia or significant  stenosis. IMA: Patent without evidence of aneurysm, dissection, vasculitis or significant stenosis. Inflow: Patent without evidence of aneurysm, dissection, vasculitis or significant stenosis. Veins: No obvious venous abnormality within the limitations of this arterial phase study. Review of the MIP images confirms the above findings. NON-VASCULAR Hepatobiliary: The liver is normal in size. Non-cirrhotic configuration. No suspicious mass. No intrahepatic or extrahepatic bile duct dilation. Redemonstration of small amount of pneumobilia medially the left low, decreased since the prior study. Gallbladder is surgically absent. Pancreas: Evaluation of pancreatic head/neck and proximal body is limited due to extensive streak artifacts from spinal fixation hardware. However, no significant pancreatic abnormality seen. Spleen: Within normal limits. No focal lesion. Adrenals/Urinary Tract: Adrenal glands are unremarkable. No suspicious renal mass within the limitations of exam due to extensive streak artifacts from spinal fixation hardware. There are at least 2 simple cysts in the left kidney, essentially similar to the prior study. No nephroureterolithiasis or obstructive uropathy. Unremarkable urinary bladder. Stomach/Bowel: No disproportionate dilation of the small or large bowel loops. No evidence of abnormal bowel wall thickening or inflammatory changes. The appendix is unremarkable. Vascular/Lymphatic: No ascites or pneumoperitoneum. No abdominal or pelvic lymphadenopathy, by size criteria. No aneurysmal dilation of the major abdominal arteries. There are mild peripheral atherosclerotic vascular calcifications of the aorta and its major branches. Reproductive: Normal size prostate. Symmetric seminal vesicles. Other: The visualized soft tissues and abdominal wall are unremarkable. Musculoskeletal: No suspicious osseous lesions. There are mild multilevel degenerative changes in the visualized spine. Redemonstration of  thoracolumbar spinal fixation hardware. Review of the MIP images confirms the above findings. IMPRESSION: 1. Examination is mildly degraded by extensive streak artifacts from thoracolumbar spinal fixation hardware. 2. No acute aortic syndrome. No acute thoracic aortic intramural hematoma. No thoracoabdominal aortic aneurysm, dissection, penetrating atherosclerotic ulcer or vasculitis. 3. No acute inflammatory process identified within the chest, abdomen or pelvis. 4. Multiple other nonacute observations, as described above. Aortic Atherosclerosis (ICD10-I70.0). Electronically Signed   By: Jules Schick M.D.   On: 08/25/2023 17:07   DG Chest 2 View Result Date: 08/25/2023 CLINICAL DATA:  Chest pressure. EXAM: CHEST - 2 VIEW COMPARISON:  CTA chest dated 08/19/2023. Chest radiograph dated 08/09/2022. FINDINGS: Stable cardiomediastinal silhouette. No focal consolidation, pleural effusion, or pneumothorax. No acute osseous abnormality. Partially visualized thoracolumbar fixation hardware. IMPRESSION: No acute cardiopulmonary findings. Electronically Signed   By: Hart Robinsons M.D.   On: 08/25/2023 13:42   NM Myocar Multi W/Spect W/Wall Motion / EF Result Date: 08/20/2023   The study is normal. The study is low risk.   No ST deviation was noted.   LV perfusion is normal. There is no evidence of ischemia. There is no evidence of infarction.   Left ventricular function is normal. Nuclear stress EF: 64%. End diastolic cavity size is normal. End systolic cavity size is normal.   CT attenuation images showed minimal aortic calcifications.   CT ABDOMEN PELVIS W CONTRAST Result Date: 08/19/2023 CLINICAL DATA:  Chest pain and back pain. EXAM: CT ABDOMEN AND PELVIS WITH CONTRAST TECHNIQUE: Multidetector CT imaging of the abdomen and pelvis was performed using the standard protocol following bolus administration of intravenous contrast. RADIATION DOSE REDUCTION: This exam was performed according to the departmental  dose-optimization program which includes automated exposure control, adjustment of the mA and/or kV according to patient size and/or use of iterative reconstruction technique. CONTRAST:  OMNIPAQUE IOHEXOL  350 MG/ML SOLN COMPARISON:  January 31, 2023 FINDINGS: Lower chest: No acute abnormality. Hepatobiliary: No focal liver abnormality is seen. Stable, moderate severity pneumobilia is noted. Status post cholecystectomy. The common bile duct measures 2.0 cm in diameter. Pancreas: Unremarkable. No pancreatic ductal dilatation or surrounding inflammatory changes. Spleen: Normal in size without focal abnormality. Adrenals/Urinary Tract: Adrenal glands are unremarkable. Kidneys are normal in size, without renal calculi or hydronephrosis. Stable simple cysts are seen within the left kidney. Bladder is unremarkable. Stomach/Bowel: Stomach is within normal limits. Appendix appears normal. No evidence of bowel wall thickening, distention, or inflammatory changes. Vascular/Lymphatic: Aortic atherosclerosis. No enlarged abdominal or pelvic lymph nodes. Reproductive: Prostate is unremarkable. Other: No abdominal wall hernia or abnormality. No abdominopelvic ascites. Musculoskeletal: Stable chronic, degenerative and postoperative changes are seen throughout the thoracolumbar spine. IMPRESSION: 1. Evidence of prior cholecystectomy with stable, moderate severity pneumobilia. 2. Stable simple left renal cysts. No follow-up imaging is recommended. This recommendation follows ACR consensus guidelines: Management of the Incidental Renal Mass on CT: A White Paper of the ACR Incidental Findings Committee. J Am Coll Radiol 2018;15:264-273. 3. Stable chronic, degenerative and postoperative changes throughout the thoracolumbar spine. 4. Aortic atherosclerosis. Aortic Atherosclerosis (ICD10-I70.0). Electronically Signed   By: Aram Candela M.D.   On: 08/19/2023 21:32   CT Angio Chest PE W and/or Wo Contrast Result Date:  08/19/2023 CLINICAL DATA:  Chest pain for 2 days EXAM: CT ANGIOGRAPHY CHEST WITH CONTRAST TECHNIQUE: Multidetector CT imaging of the chest was performed using the standard protocol during bolus administration of intravenous contrast. Multiplanar CT image reconstructions and MIPs were obtained to evaluate the vascular anatomy. RADIATION DOSE REDUCTION: This exam was performed according to the departmental dose-optimization program which includes automated exposure control, adjustment of the mA and/or kV according to patient size and/or use of iterative reconstruction technique. CONTRAST:  OMNIPAQUE IOHEXOL 350 MG/ML SOLN COMPARISON:  10/12/2021, chest x-ray from 08/09/2022 FINDINGS: Cardiovascular: Thoracic aorta demonstrates atherosclerotic calcifications without aneurysmal dilatation. The degree of opacification is limited precluding evaluation for dissection. The pulmonary artery shows a normal branching pattern bilaterally. No intraluminal filling defect to suggest pulmonary embolism is noted. Mediastinum/Nodes: Thoracic inlet is within normal limits. No hilar or mediastinal adenopathy is noted. The esophagus as visualized is within normal limits. Lungs/Pleura: Lungs are well aerated bilaterally. No focal infiltrate or sizable effusion is seen. No parenchymal nodules are noted. Upper Abdomen: Visualized upper abdomen shows pneumobilia. No other focal abnormality is seen. Musculoskeletal: Postsurgical changes are noted in the lower thoracic spine. No acute rib abnormality is noted. Review of the MIP images confirms the above findings. IMPRESSION: No evidence of pulmonary emboli. Pneumobilia stable from prior CT of the abdomen. Aortic Atherosclerosis (ICD10-I70.0). Electronically Signed   By: Alcide Clever M.D.   On: 08/19/2023 21:27   CT Cervical Spine Wo Contrast Result Date: 08/18/2023 CLINICAL DATA:  Neck trauma due to a mechanical fall. Multiple falls over the last week. Neck and back pain. EXAM: CT  CERVICAL SPINE WITHOUT CONTRAST TECHNIQUE: Multidetector CT imaging of the cervical spine was performed without intravenous contrast. Multiplanar CT image reconstructions were also generated. RADIATION DOSE REDUCTION: This exam was performed according to the departmental dose-optimization program which includes automated exposure control, adjustment of the mA and/or kV according to patient size and/or use of iterative reconstruction technique. COMPARISON:  10/06/2022 FINDINGS: Alignment: Normal alignment. Skull base and vertebrae: No vertebral compression deformities. No focal bone lesion or bone destruction. Bone cortex appears intact. Soft tissues and spinal  canal: No prevertebral soft tissue swelling. No abnormal paraspinal soft tissue mass or infiltration. Disc levels: Degenerative changes throughout with disc space narrowing and endplate osteophyte formation. Degenerative changes in the facet joints. Upper chest: Lung apices are clear. Other: None. IMPRESSION: Normal alignment. No acute displaced fractures identified. Diffuse degenerative changes. Electronically Signed   By: Burman Nieves M.D.   On: 08/18/2023 19:06   DG Lumbar Spine 2-3 Views Result Date: 08/18/2023 CLINICAL DATA:  Lower back pain after multiple falls in the last week. EXAM: LUMBAR SPINE - 2-3 VIEW COMPARISON:  Oct 06, 2022. FINDINGS: Status post surgical posterior fusion extending from T10 to the upper sacrum with bilateral in particular screw placement and Harrington rods. Interbody fusion of L1-2 and L2-3 is noted. Severe old compression deformity of L4 vertebral body is noted. Stable minimal anterolisthesis of L2-3 and L3-4 is noted. No acute fracture or other abnormality is noted. IMPRESSION: Extensive postsurgical and degenerative changes as noted above. No acute abnormalities noted. Electronically Signed   By: Lupita Raider M.D.   On: 08/18/2023 18:47    Labs: Basic Metabolic Panel: Recent Labs  Lab 08/25/23 1154  08/25/23 1454 08/26/23 0557 08/27/23 0843  NA 143  --  141 138  K 3.4*  --  3.9 3.9  CL 103  --  105 103  CO2 28  --  29 26  GLUCOSE 125*  --  97 124*  BUN 10  --  11 10  CREATININE 1.22  --  1.14 1.33*  CALCIUM 9.4  --  8.7* 9.0  MG  --  2.0  --   --   PHOS  --  3.0  --   --    CBC: Recent Labs  Lab 08/25/23 1154 08/26/23 0557  WBC 11.7* 8.5  HGB 14.0 11.6*  HCT 41.4 34.2*  MCV 88.8 91.0  PLT 302 243   Microbiology: Results for orders placed or performed during the hospital encounter of 08/25/23  Culture, blood (Routine X 2) w Reflex to ID Panel     Status: None (Preliminary result)   Collection Time: 08/26/23  6:02 AM   Specimen: BLOOD RIGHT ARM  Result Value Ref Range Status   Specimen Description BLOOD RIGHT ARM  Final   Special Requests   Final    BOTTLES DRAWN AEROBIC AND ANAEROBIC Blood Culture adequate volume   Culture   Final    NO GROWTH < 24 HOURS Performed at [facility], [address], [place], [place] [address]    Report Status PENDING  Incomplete  Culture, blood (Routine X 2) w Reflex to ID Panel     Status: None (Preliminary result)   Collection Time: 08/26/23  6:02 AM   Specimen: BLOOD LEFT ARM  Result Value Ref Range Status   Specimen Description BLOOD LEFT ARM  Final   Special Requests   Final    BOTTLES DRAWN AEROBIC AND ANAEROBIC Blood Culture results may not be optimal due to an inadequate volume of blood received in culture bottles   Culture   Final    NO GROWTH < 24 HOURS Performed at [facility], [address], [place], [place] [address]    Report Status PENDING  Incomplete    Time coordinating discharge: Over 30 minutes  Leeroy Bock, MD  [facility] 08/27/2023, 9:56 AM

## 2023-08-26 NOTE — Consult Note (Signed)
 Midge Minium, MD [facility]  [address], [address] [place], [place] [address] Phone: [phone] Fax : [phone]  Consultation  Referring Provider:     Dr. Clyde Lundborg Primary Care Physician:  [facility] Primary Gastroenterologist: [facility]         Reason for Consultation:     Choledocholithiasis  Date of Admission:  08/25/2023 Date of Consultation:  08/26/2023         HPI:   Earl Murray is a 68 y.o. male who has a history of having an ERCP by me back in 2021 with a dilated common bile duct and a previously seen sphincterotomy with sweeping of the common bile duct without any things found in the duct. The patient has a history of a cholecystectomy, hypertension with a history of GERD and a PE and on Eliquis.  The patient reports his last Eliquis was 2 days ago in the morning.  The patient was now admitted with abdominal pain/chest pain.  The patient was previously in the hospital at the end of last month for chest pain with a negative CTA for PE and negative stress test. The patient had an MRCP that showed:  MRCP Prior cholecystectomy.   Diffuse biliary ductal dilatation with common bile duct measuring 20 mm, mildly increased since 2021 exam.   Smoothly tapered distal common bile duct stricture, with numerous new common bile duct calculi measuring up to 1.7 cm in diameter.   The patient's liver enzymes showed his liver enzymes to be elevated on admission and was also elevated when he was in the hospital last week.  His recent labs showed:  Component     Latest Ref Rng 01/31/2023 08/19/2023 08/20/2023 08/25/2023 08/26/2023  AST     15 - 41 U/L 45 (H)  153 (H)  94 (H)  253 (H)  95 (H)   ALT     0 - 44 U/L 46 (H)  169 (H)  151 (H)  267 (H)  161 (H)   Alkaline Phosphatase     38 - 126 U/L 99  325 (H)  258 (H)  298 (H)  227 (H)   Total Bilirubin     0.0 - 1.2 mg/dL 1.1  2.1 (H)  1.9 (H)  2.0 (H)  1.7 (H)    I am now being asked to see the patient for an ERCP.   Past  Medical History:  Diagnosis Date   Bladder cancer (HCC)    Choledocholithiasis    Chronic kidney disease    Cocaine dependence in remission (HCC)    Concussion    Depression    DVT (deep venous thrombosis) (HCC)    GERD (gastroesophageal reflux disease)    Hepatitis C    History of cancer    Hyperlipidemia    Hypertension    Migraine    Neck injury    Opioid dependence in remission (HCC)    Pancreatitis    Prediabetes    Scoliosis    Sleep apnea     Past Surgical History:  Procedure Laterality Date   BACK SURGERY     CHOLECYSTECTOMY     ELBOW SURGERY     ERCP N/A 10/27/2019   Procedure: ENDOSCOPIC RETROGRADE CHOLANGIOPANCREATOGRAPHY (ERCP);  Surgeon: Midge Minium, MD;  Location: [facility] ENDOSCOPY;  Service: Endoscopy;  Laterality: N/A;   FOOT SURGERY     HERNIA REPAIR     4 hernia repairs   PULMONARY THROMBECTOMY N/A 10/14/2021  Procedure: PULMONARY THROMBECTOMY;  Surgeon: Renford Dills, MD;  Location: [facility] INVASIVE CV LAB;  Service: Cardiovascular;  Laterality: N/A;    Prior to Admission medications   Medication Sig Start Date End Date Taking? Authorizing Provider  amLODipine (NORVASC) 10 MG tablet Take 10 mg by mouth daily.    Yes [provider]  apixaban (ELIQUIS) 5 MG TABS tablet Take 5 mg by mouth 2 (two) times daily. 12/08/22  Yes [provider]  hydrOXYzine (ATARAX) 25 MG tablet Take 25 mg by mouth 2 (two) times daily as needed for anxiety. 08/23/23  Yes [provider]  losartan (COZAAR) 100 MG tablet Take 100 mg by mouth daily. 08/19/23 11/17/23 Yes [provider]  oxyCODONE 10 MG TABS Take 1 tablet (10 mg total) by mouth every 6 (six) hours as needed for severe pain (pain score 7-10). 08/20/23  Yes Wieting, Richard, MD  PARoxetine (PAXIL) 40 MG tablet Take 20 mg by mouth every morning. 08/23/23  Yes [provider]  tiZANidine (ZANAFLEX) 4 MG tablet Take 2 tablets (8 mg total) by mouth daily as needed for muscle spasms.  08/20/23  Yes Wieting, Richard, MD  pantoprazole (PROTONIX) 40 MG tablet Take 1 tablet (40 mg total) by mouth daily. Patient not taking: Reported on 08/26/2023 08/20/23 09/19/23  Alford Highland, MD  gabapentin (NEURONTIN) 300 MG capsule gabapentin 300 mg capsule  one tab bid-tid  03/01/19  [provider]  ipratropium (ATROVENT) 0.06 % nasal spray Place 2 sprays into both nostrils 4 (four) times daily as needed for rhinitis. 10/22/19 03/16/20  Verlee Monte, NP  levocetirizine (XYZAL) 5 MG tablet Take 1 tablet (5 mg total) by mouth every evening. 09/30/19 03/16/20  Tommie Sams, DO    Family History  Problem Relation Age of Onset   Hypertension Mother    Heart disease Mother    Colon cancer Father      Social History   Tobacco Use   Smoking status: Former    Current packs/day: 0.00    Types: Cigarettes    Quit date: 05/2018    Years since quitting: 5.2   Smokeless tobacco: Never  Vaping Use   Vaping status: Never Used  Substance Use Topics   Alcohol use: Yes    Comment: occ   Drug use: Not Currently    Allergies as of 08/25/2023 - Review Complete 08/25/2023  Allergen Reaction Noted   Gabapentin Other (See Comments) 12/09/2022   Toradol [ketorolac tromethamine] Itching 08/19/2023   Motrin [ibuprofen] Itching and Other (See Comments) 10/23/2017   Tramadol Rash 10/23/2017   Tylenol with codeine #3 [acetaminophen-codeine] Itching 03/01/2019    Review of Systems:    All systems reviewed and negative except where noted in HPI.   Physical Exam:  Vital signs in last 24 hours: Temp:  [98.3 F (36.8 C)-99.2 F (37.3 C)] 98.3 F (36.8 C) (04/03 0739) Pulse Rate:  [51-97] 56 (04/03 0730) Resp:  [13-23] 23 (04/03 0730) BP: (128-154)/(69-91) 140/75 (04/03 0730) SpO2:  [97 %-100 %] 100 % (04/03 0730) Weight:  [90.7 kg] 90.7 kg (04/02 1153)   General:   Pleasant, cooperative in NAD Head:  Normocephalic and atraumatic. Eyes:   No icterus.   Conjunctiva pink.  PERRLA. Ears:  Normal auditory acuity. Neck:  Supple; no masses or thyroidomegaly Lungs: Respirations even and unlabored. Lungs clear to auscultation bilaterally.   No wheezes, crackles, or rhonchi.  Heart:  Regular rate and rhythm;  Without murmur, clicks, rubs or gallops  Abdomen:  Soft, nondistended, nontender. Normal bowel sounds. No appreciable masses or hepatomegaly.  No rebound or guarding.  Rectal:  Not performed. Msk:  Symmetrical without gross deformities.    Extremities:  Without edema, cyanosis or clubbing. Neurologic:  Alert and oriented x3;  grossly normal neurologically. Skin:  Intact without significant lesions or rashes. Cervical Nodes:  No significant cervical adenopathy. Psych:  Alert and cooperative. Normal affect.  LAB RESULTS: Recent Labs    08/25/23 1154 08/26/23 0557  WBC 11.7* 8.5  HGB 14.0 11.6*  HCT 41.4 34.2*  PLT 302 243   BMET Recent Labs    08/25/23 1154 08/26/23 0557  NA 143 141  K 3.4* 3.9  CL 103 105  CO2 28 29  GLUCOSE 125* 97  BUN 10 11  CREATININE 1.22 1.14  CALCIUM 9.4 8.7*   LFT Recent Labs    08/25/23 1155 08/26/23 0557  PROT 7.7 6.3*  ALBUMIN 3.6 3.1*  AST 253* 95*  ALT 267* 161*  ALKPHOS 298* 227*  BILITOT 2.0* 1.7*  BILIDIR 0.5*  --   IBILI 1.5*  --    PT/INR Recent Labs    08/25/23 1155  LABPROT 13.6  INR 1.0    STUDIES: MR ABDOMEN MRCP W WO CONTAST Result Date: 08/25/2023 CLINICAL DATA:  Abdominal pain.  Elevated liver function tests. EXAM: MRI ABDOMEN WITHOUT AND WITH CONTRAST (INCLUDING MRCP) TECHNIQUE: Multiplanar multisequence MR imaging of the abdomen was performed both before and after the administration of intravenous contrast. Heavily T2-weighted images of the biliary and pancreatic ducts were obtained, and three-dimensional MRCP images were rendered by post processing. CONTRAST:  9mL GADAVIST GADOBUTROL 1 MMOL/ML IV SOLN COMPARISON:  11/25/2019 FINDINGS: Lower chest: No acute findings. Hepatobiliary: No  hepatic masses identified. Prior cholecystectomy. Diffuse biliary ductal dilatation shows mild increase since prior MRI in 2021, with common bile duct measuring 20 mm in diameter. Smoothly tapered distal common bile duct stricture is seen near the ampulla. Numerous new calculi are seen throughout the common bile duct, measuring up to 1.7 cm in diameter. Low cystic duct insertion into the distal common bile duct also noted. Pancreas: No mass or inflammatory changes. No evidence of pancreatic ductal dilatation. Spleen:  Within normal limits in size and appearance. Adrenals/Urinary Tract: Benign Bosniak category 1 renal cysts are again seen bilaterally (No followup imaging is recommended). No suspicious masses identified. No evidence of hydronephrosis. Stomach/Bowel: Unremarkable. Vascular/Lymphatic: No pathologically enlarged lymph nodes identified. No acute vascular findings. Other:  None. Musculoskeletal:  No suspicious bone lesions identified. IMPRESSION: Prior cholecystectomy. Diffuse biliary ductal dilatation with common bile duct measuring 20 mm, mildly increased since 2021 exam. Smoothly tapered distal common bile duct stricture, with numerous new common bile duct calculi measuring up to 1.7 cm in diameter. Electronically Signed   By: Danae Orleans M.D.   On: 08/25/2023 22:01   MR 3D Recon At Scanner Result Date: 08/25/2023 CLINICAL DATA:  Abdominal pain.  Elevated liver function tests. EXAM: MRI ABDOMEN WITHOUT AND WITH CONTRAST (INCLUDING MRCP) TECHNIQUE: Multiplanar multisequence MR imaging of the abdomen was performed both before and after the administration of intravenous contrast. Heavily T2-weighted images of the biliary and pancreatic ducts were obtained, and three-dimensional MRCP images were rendered by post processing. CONTRAST:  9mL GADAVIST GADOBUTROL 1 MMOL/ML IV SOLN COMPARISON:  11/25/2019 FINDINGS: Lower chest: No acute findings. Hepatobiliary: No hepatic masses identified. Prior  cholecystectomy. Diffuse biliary ductal dilatation shows mild increase since prior MRI in 2021, with common bile duct measuring  20 mm in diameter. Smoothly tapered distal common bile duct stricture is seen near the ampulla. Numerous new calculi are seen throughout the common bile duct, measuring up to 1.7 cm in diameter. Low cystic duct insertion into the distal common bile duct also noted. Pancreas: No mass or inflammatory changes. No evidence of pancreatic ductal dilatation. Spleen:  Within normal limits in size and appearance. Adrenals/Urinary Tract: Benign Bosniak category 1 renal cysts are again seen bilaterally (No followup imaging is recommended). No suspicious masses identified. No evidence of hydronephrosis. Stomach/Bowel: Unremarkable. Vascular/Lymphatic: No pathologically enlarged lymph nodes identified. No acute vascular findings. Other:  None. Musculoskeletal:  No suspicious bone lesions identified. IMPRESSION: Prior cholecystectomy. Diffuse biliary ductal dilatation with common bile duct measuring 20 mm, mildly increased since 2021 exam. Smoothly tapered distal common bile duct stricture, with numerous new common bile duct calculi measuring up to 1.7 cm in diameter. Electronically Signed   By: Danae Orleans M.D.   On: 08/25/2023 22:01   CT Angio Chest/Abd/Pel for Dissection W and/or W/WO Result Date: 08/25/2023 CLINICAL DATA:  Acute aortic syndrome (AAS) suspected abd pain, also considered mesenteric ischemia. Upper abdominal pain and right and central chest pain. EXAM: CT ANGIOGRAPHY CHEST, ABDOMEN AND PELVIS TECHNIQUE: Non-contrast CT of the chest was initially obtained. Multidetector CT imaging through the chest, abdomen and pelvis was performed using the standard protocol during bolus administration of intravenous contrast. Multiplanar reconstructed images and MIPs were obtained and reviewed to evaluate the vascular anatomy. RADIATION DOSE REDUCTION: This exam was performed according to the  departmental dose-optimization program which includes automated exposure control, adjustment of the mA and/or kV according to patient size and/or use of iterative reconstruction technique. CONTRAST:  OMNIPAQUE IOHEXOL 350 MG/ML SOLN COMPARISON:  CT Angiography chest and CT scan abdomen and pelvis from 08/19/2023. FINDINGS: CTA CHEST FINDINGS Cardiovascular: No intramural hematoma noted in the thoracic aorta on the unenhanced images. Thoracic aorta is normal in caliber without aneurysm, dissection, vasculitis or significant stenosis. There is also satisfactory opacification of pulmonary arteries. No embolism seen to the proximal subsegmental pulmonary artery level. Normal cardiac size. No pericardial effusion. No aortic aneurysm. There are coronary artery calcifications, in keeping with coronary artery disease. There are also mild peripheral atherosclerotic vascular calcifications of thoracic aorta and its major branches. Mediastinum/Nodes: Visualized thyroid gland appears grossly unremarkable. No solid / cystic mediastinal masses. The esophagus is nondistended precluding optimal assessment. No axillary, mediastinal or hilar lymphadenopathy by size criteria. Lungs/Pleura: The central tracheo-bronchial tree is patent. No mass or consolidation. No pleural effusion or pneumothorax. No suspicious lung nodules. Musculoskeletal: The visualized soft tissues of the chest wall are grossly unremarkable. No suspicious osseous lesions. There are mild multilevel degenerative changes in the visualized spine. Review of the MIP images confirms the above findings. CTA ABDOMEN AND PELVIS FINDINGS VASCULAR Aorta: Normal caliber aorta without aneurysm, dissection, vasculitis or significant stenosis. Celiac: Patent without evidence of aneurysm, dissection, vasculitis or significant stenosis. SMA: Patent without evidence of aneurysm, dissection, vasculitis or significant stenosis. Renals: Both renal arteries are patent without  evidence of aneurysm, dissection, vasculitis, fibromuscular dysplasia or significant stenosis. IMA: Patent without evidence of aneurysm, dissection, vasculitis or significant stenosis. Inflow: Patent without evidence of aneurysm, dissection, vasculitis or significant stenosis. Veins: No obvious venous abnormality within the limitations of this arterial phase study. Review of the MIP images confirms the above findings. NON-VASCULAR Hepatobiliary: The liver is normal in size. Non-cirrhotic configuration. No suspicious mass. No intrahepatic or extrahepatic bile duct dilation. Redemonstration  of small amount of pneumobilia medially the left low, decreased since the prior study. Gallbladder is surgically absent. Pancreas: Evaluation of pancreatic head/neck and proximal body is limited due to extensive streak artifacts from spinal fixation hardware. However, no significant pancreatic abnormality seen. Spleen: Within normal limits. No focal lesion. Adrenals/Urinary Tract: Adrenal glands are unremarkable. No suspicious renal mass within the limitations of exam due to extensive streak artifacts from spinal fixation hardware. There are at least 2 simple cysts in the left kidney, essentially similar to the prior study. No nephroureterolithiasis or obstructive uropathy. Unremarkable urinary bladder. Stomach/Bowel: No disproportionate dilation of the small or large bowel loops. No evidence of abnormal bowel wall thickening or inflammatory changes. The appendix is unremarkable. Vascular/Lymphatic: No ascites or pneumoperitoneum. No abdominal or pelvic lymphadenopathy, by size criteria. No aneurysmal dilation of the major abdominal arteries. There are mild peripheral atherosclerotic vascular calcifications of the aorta and its major branches. Reproductive: Normal size prostate. Symmetric seminal vesicles. Other: The visualized soft tissues and abdominal wall are unremarkable. Musculoskeletal: No suspicious osseous lesions. There  are mild multilevel degenerative changes in the visualized spine. Redemonstration of thoracolumbar spinal fixation hardware. Review of the MIP images confirms the above findings. IMPRESSION: 1. Examination is mildly degraded by extensive streak artifacts from thoracolumbar spinal fixation hardware. 2. No acute aortic syndrome. No acute thoracic aortic intramural hematoma. No thoracoabdominal aortic aneurysm, dissection, penetrating atherosclerotic ulcer or vasculitis. 3. No acute inflammatory process identified within the chest, abdomen or pelvis. 4. Multiple other nonacute observations, as described above. Aortic Atherosclerosis (ICD10-I70.0). Electronically Signed   By: Jules Schick M.D.   On: 08/25/2023 17:07   DG Chest 2 View Result Date: 08/25/2023 CLINICAL DATA:  Chest pressure. EXAM: CHEST - 2 VIEW COMPARISON:  CTA chest dated 08/19/2023. Chest radiograph dated 08/09/2022. FINDINGS: Stable cardiomediastinal silhouette. No focal consolidation, pleural effusion, or pneumothorax. No acute osseous abnormality. Partially visualized thoracolumbar fixation hardware. IMPRESSION: No acute cardiopulmonary findings. Electronically Signed   By: Hart Robinsons M.D.   On: 08/25/2023 13:42      Impression / Plan:   Assessment: Principal Problem:   Choledocholithiasis Active Problems:   Hypertension   DVT (deep venous thrombosis) (HCC)   Elevated LFTs   Personal history of pulmonary embolism   Chest pain   Elevated lactic acid level   Earl Murray is a 68 y.o. y/o male with a positive MRI with a previous ERCP with a dilated duct and no findings at that time.  The MRCP now shows a common bile duct stone with dilation of the common bile duct.  It appears that the patient's previous ERCP was done in July 2007  Plan:  The patient will be set up for an ERCP today.  The patient has been put on a heparin drip and that heparin drip was stopped at 8:30 this morning therefore the ERCP will be planned for 2:30  this afternoon after the heparin is held for 6 hours. The patient has been explained the risks and benefits including pancreatitis failed procedure bleeding and death.  The patient agrees to undergoing the procedure.  Thank you for involving me in the care of this patient.      LOS: 1 day   Midge Minium, MD, MD. Clementeen Graham 08/26/2023, 10:06 AM,  Pager [phone] 7am-5pm  Check AMION for 5pm -7am coverage and on weekends   Note: This dictation was prepared with Dragon dictation along with smaller phrase technology. Any transcriptional errors that result from this process are unintentional.

## 2023-08-26 NOTE — OR Nursing (Addendum)
 Called for report for procedure ERCP, patient on Heparin gtt. Per protocol patient needs to be off heparin gtt x6-8hours before procedure can be perform. Jes RN will notify MD for order. Dr. Servando Snare wants heparin gtt stop now so may do procedure at 1430.

## 2023-08-26 NOTE — Progress Notes (Signed)
 PROGRESS NOTE  Earl Murray    DOB: 1955-06-28, 68 y.o.  ION:[id number]    Code Status: Full Code   DOA: 08/25/2023   LOS: 1   Brief hospital course  Earl Murray is a 68 y.o. male with medical history significant of s/p of cholecystectomy, HTN, HLD, prediabetes, GERD, depression, PE on Eliquis, OSA, former smoker, cocaine abuse in remission, pancreatitis, kidney stone, bladder cancer, chronic low back pain, choledocholithiasis, who presents with abdominal pain and chest pain.   Patient was recently hospitalized from 3/27 - 3/28 due to chest pain.  Patient had negative CTA for PE and negative stress test.  Patient states that he still has intermittent chest pain, which is located in the substernal area, sharp, 8 out of 10 in severity, nonradiating, pleuritic, aggravated by deep breath.  He has mild SOB, no cough, fever or chills.  Patient states since last night, he started having abdominal pain, which is located in the lower abdomen, constant, severe, aching, nonradiating, associated with nausea and multiple episodes of nonbilious nonbloody vomiting.  No diarrhea.  No symptoms of UTI.  No hematuria.  Patient took last dose of Eliquis this morning.   ED Course: pt was found to have WBC 11.7, GFR> 60, potassium 3.4, abnormal liver function (ALP 298, AST 253, ALT 267, total bilirubin 2.0, direct improving 1.5), INR 1.0, PTT 25, troponin 10 --> 10, lactic acid 1.4 --> 2.8.  Chest x-ray negative.  CTA of chest/abdomen/pelvis negative for dissection.    MRCP- Diffuse biliary ductal dilatation with common bile duct measuring 20 mm, mildly increased since 2021 exam. Smoothly tapered distal common bile duct stricture, with numerous new common bile duct calculi measuring up to 1.7 cm in diameter.    GI consulted and completed ERCP with removal of stones and resulted in patent CBD. Patient tolerated procedure well. Wants to stay overnight due to upset stomach.   Assessment & Plan  Principal Problem:    Choledocholithiasis Active Problems:   Chest pain   Hypertension   Elevated LFTs   Elevated lactic acid level   DVT (deep venous thrombosis) (HCC)   Personal history of pulmonary embolism  Choledocholithiasis: MRCP showed diffuse biliary ductal dilatation with common bile duct measuring 20 mm, mildly increased since 2021 exam, smoothly tapered distal common bile duct stricture, with numerous new common bile duct calculi measuring up to 1.7 cm in diameter.  S/p ERCP 4/3 with patent CBD - GI following - CMP am - supportive care PRN   Chest pain: resolved   Hypertension -Amlodipine, Cozaar   Elevated LFTs: Due to choledocholithiasis.   Elevated lactic acid level: resolved  Hx of DVT (deep venous thrombosis) (HCC) and Personal history of pulmonary embolism -restart eliquis post-procedure   Hypokalemia: resolved  Body mass index is 23.72 kg/m.  VTE ppx: eliquis  Diet:     Diet   Diet NPO time specified Except for: Sips with Meds, Ice Chips   Consultants: GI  Subjective 08/26/23    Pt reports feeling well. Denies nausea or abdominal pain currently. Awaiting ERCP. Asks to go home after.    Objective   Vitals:   08/26/23 0400 08/26/23 0430 08/26/23 0730 08/26/23 0739  BP: 128/73 (!) 146/72 (!) 140/75   Pulse: (!) 57 64 (!) 56   Resp: 13 19 (!) 23   Temp:    98.3 F (36.8 C)  TempSrc:    Oral  SpO2: 97% 98% 100%   Weight:      Height:  Intake/Output Summary (Last 24 hours) at 08/26/2023 0820 Last data filed at 08/25/2023 1924 Gross per 24 hour  Intake 1000 ml  Output --  Net 1000 ml   Filed Weights   08/25/23 1153  Weight: 90.7 kg     Physical Exam:  General: awake, alert, NAD HEENT: atraumatic, clear conjunctiva, anicteric sclera, MMM, hearing grossly normal Respiratory: normal respiratory effort. Cardiovascular: quick capillary refill, normal S1/S2, RRR, no JVD, murmurs Gastrointestinal: soft, NT, ND Nervous: A&O x3. no gross focal neurologic  deficits, normal speech Extremities: moves all equally, no edema, normal tone Skin: dry, intact, normal temperature, normal color. No rashes, lesions or ulcers on exposed skin Psychiatry: normal mood, congruent affect  Labs   I have personally reviewed the following labs and imaging studies CBC    Component Value Date/Time   WBC 8.5 08/26/2023 0557   RBC 3.76 (L) 08/26/2023 0557   HGB 11.6 (L) 08/26/2023 0557   HCT 34.2 (L) 08/26/2023 0557   PLT 243 08/26/2023 0557   MCV 91.0 08/26/2023 0557   MCH 30.9 08/26/2023 0557   MCHC 33.9 08/26/2023 0557   RDW 15.8 (H) 08/26/2023 0557   LYMPHSABS 2.4 08/19/2023 1936   MONOABS 1.2 (H) 08/19/2023 1936   EOSABS 0.1 08/19/2023 1936   BASOSABS 0.1 08/19/2023 1936      Latest Ref Rng & Units 08/26/2023    5:57 AM 08/25/2023   11:54 AM 08/20/2023    8:21 AM  BMP  Glucose 70 - 99 mg/dL 97  308  657   BUN 8 - 23 mg/dL 11  10  15    Creatinine 0.61 - 1.24 mg/dL 8.46  9.62  9.52   Sodium 135 - 145 mmol/L 141  143  138   Potassium 3.5 - 5.1 mmol/L 3.9  3.4  3.6   Chloride 98 - 111 mmol/L 105  103  102   CO2 22 - 32 mmol/L 29  28  28    Calcium 8.9 - 10.3 mg/dL 8.7  9.4  9.1     MR ABDOMEN MRCP W WO CONTAST Result Date: 08/25/2023 CLINICAL DATA:  Abdominal pain.  Elevated liver function tests. EXAM: MRI ABDOMEN WITHOUT AND WITH CONTRAST (INCLUDING MRCP) TECHNIQUE: Multiplanar multisequence MR imaging of the abdomen was performed both before and after the administration of intravenous contrast. Heavily T2-weighted images of the biliary and pancreatic ducts were obtained, and three-dimensional MRCP images were rendered by post processing. CONTRAST:  9mL GADAVIST GADOBUTROL 1 MMOL/ML IV SOLN COMPARISON:  11/25/2019 FINDINGS: Lower chest: No acute findings. Hepatobiliary: No hepatic masses identified. Prior cholecystectomy. Diffuse biliary ductal dilatation shows mild increase since prior MRI in 2021, with common bile duct measuring 20 mm in diameter.  Smoothly tapered distal common bile duct stricture is seen near the ampulla. Numerous new calculi are seen throughout the common bile duct, measuring up to 1.7 cm in diameter. Low cystic duct insertion into the distal common bile duct also noted. Pancreas: No mass or inflammatory changes. No evidence of pancreatic ductal dilatation. Spleen:  Within normal limits in size and appearance. Adrenals/Urinary Tract: Benign Bosniak category 1 renal cysts are again seen bilaterally (No followup imaging is recommended). No suspicious masses identified. No evidence of hydronephrosis. Stomach/Bowel: Unremarkable. Vascular/Lymphatic: No pathologically enlarged lymph nodes identified. No acute vascular findings. Other:  None. Musculoskeletal:  No suspicious bone lesions identified. IMPRESSION: Prior cholecystectomy. Diffuse biliary ductal dilatation with common bile duct measuring 20 mm, mildly increased since 2021 exam. Smoothly tapered distal common bile duct  stricture, with numerous new common bile duct calculi measuring up to 1.7 cm in diameter. Electronically Signed   By: Danae Orleans M.D.   On: 08/25/2023 22:01   MR 3D Recon At Scanner Result Date: 08/25/2023 CLINICAL DATA:  Abdominal pain.  Elevated liver function tests. EXAM: MRI ABDOMEN WITHOUT AND WITH CONTRAST (INCLUDING MRCP) TECHNIQUE: Multiplanar multisequence MR imaging of the abdomen was performed both before and after the administration of intravenous contrast. Heavily T2-weighted images of the biliary and pancreatic ducts were obtained, and three-dimensional MRCP images were rendered by post processing. CONTRAST:  9mL GADAVIST GADOBUTROL 1 MMOL/ML IV SOLN COMPARISON:  11/25/2019 FINDINGS: Lower chest: No acute findings. Hepatobiliary: No hepatic masses identified. Prior cholecystectomy. Diffuse biliary ductal dilatation shows mild increase since prior MRI in 2021, with common bile duct measuring 20 mm in diameter. Smoothly tapered distal common bile duct  stricture is seen near the ampulla. Numerous new calculi are seen throughout the common bile duct, measuring up to 1.7 cm in diameter. Low cystic duct insertion into the distal common bile duct also noted. Pancreas: No mass or inflammatory changes. No evidence of pancreatic ductal dilatation. Spleen:  Within normal limits in size and appearance. Adrenals/Urinary Tract: Benign Bosniak category 1 renal cysts are again seen bilaterally (No followup imaging is recommended). No suspicious masses identified. No evidence of hydronephrosis. Stomach/Bowel: Unremarkable. Vascular/Lymphatic: No pathologically enlarged lymph nodes identified. No acute vascular findings. Other:  None. Musculoskeletal:  No suspicious bone lesions identified. IMPRESSION: Prior cholecystectomy. Diffuse biliary ductal dilatation with common bile duct measuring 20 mm, mildly increased since 2021 exam. Smoothly tapered distal common bile duct stricture, with numerous new common bile duct calculi measuring up to 1.7 cm in diameter. Electronically Signed   By: Danae Orleans M.D.   On: 08/25/2023 22:01   CT Angio Chest/Abd/Pel for Dissection W and/or W/WO Result Date: 08/25/2023 CLINICAL DATA:  Acute aortic syndrome (AAS) suspected abd pain, also considered mesenteric ischemia. Upper abdominal pain and right and central chest pain. EXAM: CT ANGIOGRAPHY CHEST, ABDOMEN AND PELVIS TECHNIQUE: Non-contrast CT of the chest was initially obtained. Multidetector CT imaging through the chest, abdomen and pelvis was performed using the standard protocol during bolus administration of intravenous contrast. Multiplanar reconstructed images and MIPs were obtained and reviewed to evaluate the vascular anatomy. RADIATION DOSE REDUCTION: This exam was performed according to the departmental dose-optimization program which includes automated exposure control, adjustment of the mA and/or kV according to patient size and/or use of iterative reconstruction technique.  CONTRAST:  OMNIPAQUE IOHEXOL 350 MG/ML SOLN COMPARISON:  CT Angiography chest and CT scan abdomen and pelvis from 08/19/2023. FINDINGS: CTA CHEST FINDINGS Cardiovascular: No intramural hematoma noted in the thoracic aorta on the unenhanced images. Thoracic aorta is normal in caliber without aneurysm, dissection, vasculitis or significant stenosis. There is also satisfactory opacification of pulmonary arteries. No embolism seen to the proximal subsegmental pulmonary artery level. Normal cardiac size. No pericardial effusion. No aortic aneurysm. There are coronary artery calcifications, in keeping with coronary artery disease. There are also mild peripheral atherosclerotic vascular calcifications of thoracic aorta and its major branches. Mediastinum/Nodes: Visualized thyroid gland appears grossly unremarkable. No solid / cystic mediastinal masses. The esophagus is nondistended precluding optimal assessment. No axillary, mediastinal or hilar lymphadenopathy by size criteria. Lungs/Pleura: The central tracheo-bronchial tree is patent. No mass or consolidation. No pleural effusion or pneumothorax. No suspicious lung nodules. Musculoskeletal: The visualized soft tissues of the chest wall are grossly unremarkable. No suspicious osseous lesions. There  are mild multilevel degenerative changes in the visualized spine. Review of the MIP images confirms the above findings. CTA ABDOMEN AND PELVIS FINDINGS VASCULAR Aorta: Normal caliber aorta without aneurysm, dissection, vasculitis or significant stenosis. Celiac: Patent without evidence of aneurysm, dissection, vasculitis or significant stenosis. SMA: Patent without evidence of aneurysm, dissection, vasculitis or significant stenosis. Renals: Both renal arteries are patent without evidence of aneurysm, dissection, vasculitis, fibromuscular dysplasia or significant stenosis. IMA: Patent without evidence of aneurysm, dissection, vasculitis or significant stenosis. Inflow:  Patent without evidence of aneurysm, dissection, vasculitis or significant stenosis. Veins: No obvious venous abnormality within the limitations of this arterial phase study. Review of the MIP images confirms the above findings. NON-VASCULAR Hepatobiliary: The liver is normal in size. Non-cirrhotic configuration. No suspicious mass. No intrahepatic or extrahepatic bile duct dilation. Redemonstration of small amount of pneumobilia medially the left low, decreased since the prior study. Gallbladder is surgically absent. Pancreas: Evaluation of pancreatic head/neck and proximal body is limited due to extensive streak artifacts from spinal fixation hardware. However, no significant pancreatic abnormality seen. Spleen: Within normal limits. No focal lesion. Adrenals/Urinary Tract: Adrenal glands are unremarkable. No suspicious renal mass within the limitations of exam due to extensive streak artifacts from spinal fixation hardware. There are at least 2 simple cysts in the left kidney, essentially similar to the prior study. No nephroureterolithiasis or obstructive uropathy. Unremarkable urinary bladder. Stomach/Bowel: No disproportionate dilation of the small or large bowel loops. No evidence of abnormal bowel wall thickening or inflammatory changes. The appendix is unremarkable. Vascular/Lymphatic: No ascites or pneumoperitoneum. No abdominal or pelvic lymphadenopathy, by size criteria. No aneurysmal dilation of the major abdominal arteries. There are mild peripheral atherosclerotic vascular calcifications of the aorta and its major branches. Reproductive: Normal size prostate. Symmetric seminal vesicles. Other: The visualized soft tissues and abdominal wall are unremarkable. Musculoskeletal: No suspicious osseous lesions. There are mild multilevel degenerative changes in the visualized spine. Redemonstration of thoracolumbar spinal fixation hardware. Review of the MIP images confirms the above findings. IMPRESSION: 1.  Examination is mildly degraded by extensive streak artifacts from thoracolumbar spinal fixation hardware. 2. No acute aortic syndrome. No acute thoracic aortic intramural hematoma. No thoracoabdominal aortic aneurysm, dissection, penetrating atherosclerotic ulcer or vasculitis. 3. No acute inflammatory process identified within the chest, abdomen or pelvis. 4. Multiple other nonacute observations, as described above. Aortic Atherosclerosis (ICD10-I70.0). Electronically Signed   By: Jules Schick M.D.   On: 08/25/2023 17:07   DG Chest 2 View Result Date: 08/25/2023 CLINICAL DATA:  Chest pressure. EXAM: CHEST - 2 VIEW COMPARISON:  CTA chest dated 08/19/2023. Chest radiograph dated 08/09/2022. FINDINGS: Stable cardiomediastinal silhouette. No focal consolidation, pleural effusion, or pneumothorax. No acute osseous abnormality. Partially visualized thoracolumbar fixation hardware. IMPRESSION: No acute cardiopulmonary findings. Electronically Signed   By: Hart Robinsons M.D.   On: 08/25/2023 13:42    Disposition Plan & Communication  Patient status: Inpatient  Admitted From: Home Planned disposition location: Home Anticipated discharge date: 4/4 pending clinical improvement  Family Communication: none at bedside    Author: Leeroy Bock, DO [facility] 08/26/2023, 8:20 AM   Available by Epic secure chat 7AM-7PM. If 7PM-7AM, please contact night-coverage.  [facility] contact information found on [email or web address].

## 2023-08-26 NOTE — Anesthesia Postprocedure Evaluation (Signed)
 Anesthesia Post Note  Patient: Earl Murray  Procedure(s) Performed: ERCP, WITH INTERVENTION IF INDICATED  Patient location during evaluation: Endoscopy Anesthesia Type: General Level of consciousness: awake and alert Pain management: pain level controlled Vital Signs Assessment: post-procedure vital signs reviewed and stable Respiratory status: spontaneous breathing, nonlabored ventilation, respiratory function stable and patient connected to nasal cannula oxygen Cardiovascular status: blood pressure returned to baseline and stable Postop Assessment: no apparent nausea or vomiting Anesthetic complications: no   No notable events documented.   Last Vitals:  Vitals:   08/26/23 1632 08/26/23 1858  BP: (!) 145/76 133/70  Pulse:  (!) 55  Resp:    Temp:  36.7 C  SpO2:  98%    Last Pain:  Vitals:   08/26/23 1729  TempSrc:   PainSc: 5                  Lenard Simmer

## 2023-08-26 NOTE — Care Management CC44 (Signed)
 Condition Code 44 Documentation Completed  Patient Details  Name: Earl Murray MRN: [id number] Date of Birth: 07-Jan-1956   Condition Code 44 given:  Yes Patient signature on Condition Code 44 notice:  Yes Documentation of 2 MD's agreement:  Yes Code 44 added to claim:  Yes    G[place] Twanisha Foulk, LCSW 08/26/2023, 6:51 PM

## 2023-08-26 NOTE — Anesthesia Preprocedure Evaluation (Addendum)
 Anesthesia Evaluation  Patient identified by MRN, date of birth, ID band Patient awake    Reviewed: Allergy & Precautions, H&P , NPO status , Patient's Chart, lab work & pertinent test results  Airway Mallampati: III  TM Distance: >3 FB Neck ROM: full    Dental  (+) Poor Dentition, Missing   Pulmonary sleep apnea , former smoker H/o PE and 10/14/2021: PULMONARY THROMBECTOMy   Pulmonary exam normal        Cardiovascular hypertension,  Rhythm:Regular Rate:Bradycardia     Neuro/Psych  Headaches PSYCHIATRIC DISORDERS  Depression       GI/Hepatic Neg liver ROS,GERD  ,,  Endo/Other  negative endocrine ROS    Renal/GU Renal InsufficiencyRenal disease     Musculoskeletal   Abdominal Normal abdominal exam  (+)   Peds  Hematology negative hematology ROS (+)   Anesthesia Other Findings Patient was recently hospitalized from 3/27 - 3/28 due to chest pain.  Patient had negative CTA for PE and negative stress test. Chest pain this admission: Patient has pleuritic chest pain.  Recent CTA negative for PE.  CT of chest/pelvis/abdomen is negative for dissection today.  Troponin negative x 2.  Elevated LFTs: Due to choledocholithiasis  Past Medical History: No date: Bladder cancer (HCC) No date: Choledocholithiasis No date: Chronic kidney disease No date: Cocaine dependence in remission (HCC) No date: Concussion No date: Depression No date: DVT (deep venous thrombosis) (HCC) No date: GERD (gastroesophageal reflux disease) No date: Hepatitis C No date: History of cancer No date: Hyperlipidemia No date: Hypertension No date: Migraine No date: Neck injury No date: Opioid dependence in remission (HCC) No date: Pancreatitis No date: Prediabetes No date: Scoliosis No date: Sleep apnea  Past Surgical History: No date: BACK SURGERY No date: CHOLECYSTECTOMY No date: ELBOW SURGERY 10/27/2019: ERCP; N/A     Comment:  Procedure:  ENDOSCOPIC RETROGRADE               CHOLANGIOPANCREATOGRAPHY (ERCP);  Surgeon: Midge Minium,               MD;  Location[facility] ENDOSCOPY;  Service: Endoscopy;                Laterality: N/A; No date: FOOT SURGERY No date: HERNIA REPAIR     Comment:  4 hernia repairs 10/14/2021: PULMONARY THROMBECTOMY; N/A     Comment:  Procedure: PULMONARY THROMBECTOMY;  Surgeon: Renford Dills, MD;  Location: [facility] INVASIVE CV LAB;  Service:              Cardiovascular;  Laterality: N/A;  BMI    Body Mass Index: 23.72 kg/m      Reproductive/Obstetrics negative OB ROS                             Anesthesia Physical Anesthesia Plan  ASA: 3  Anesthesia Plan: General ETT   Post-op Pain Management: Minimal or no pain anticipated   Induction:   PONV Risk Score and Plan: 2 and Ondansetron and Dexamethasone  Airway Management Planned: Oral ETT  Additional Equipment:   Intra-op Plan:   Post-operative Plan: Extubation in OR  Informed Consent: I have reviewed the patients History and Physical, chart, labs and discussed the procedure including the risks, benefits and alternatives for the proposed anesthesia with the patient or authorized representative who has indicated his/her understanding and acceptance.  Dental Advisory Given  Plan Discussed with: CRNA and Surgeon  Anesthesia Plan Comments:         Anesthesia Quick Evaluation

## 2023-08-26 NOTE — Transfer of Care (Signed)
 Immediate Anesthesia Transfer of Care Note  Patient: Earl Murray  Procedure(s) Performed: ERCP, WITH INTERVENTION IF INDICATED  Patient Location: PACU  Anesthesia Type:General  Level of Consciousness: awake, alert , and oriented  Airway & Oxygen Therapy: Patient Spontanous Breathing  Post-op Assessment: Report given to RN and Post -op Vital signs reviewed and stable  Post vital signs: stable  Last Vitals:  Vitals Value Taken Time  BP 182/127 08/26/23 1611  Temp    Pulse 119 08/26/23 1611  Resp    SpO2 99 % 08/26/23 1611  Vitals shown include unfiled device data.  Last Pain:  Vitals:   08/26/23 1344  TempSrc: Temporal  PainSc: 8          Complications: No notable events documented.

## 2023-08-26 NOTE — ED Notes (Signed)
 Attending notified of low HR and stopped Heparin to do ERCP at 1430 today.

## 2023-08-26 NOTE — ED Notes (Signed)
 Fall precautions verified Pt is ambulatory and is a low fall risk.

## 2023-08-27 ENCOUNTER — Encounter: Payer: Self-pay | Admitting: Gastroenterology

## 2023-08-27 ENCOUNTER — Other Ambulatory Visit: Payer: Self-pay

## 2023-08-27 DIAGNOSIS — K8050 Calculus of bile duct without cholangitis or cholecystitis without obstruction: Secondary | ICD-10-CM | POA: Diagnosis not present

## 2023-08-27 DIAGNOSIS — R1084 Generalized abdominal pain: Secondary | ICD-10-CM

## 2023-08-27 DIAGNOSIS — R112 Nausea with vomiting, unspecified: Secondary | ICD-10-CM | POA: Diagnosis not present

## 2023-08-27 DIAGNOSIS — R7401 Elevation of levels of liver transaminase levels: Secondary | ICD-10-CM | POA: Diagnosis not present

## 2023-08-27 LAB — COMPREHENSIVE METABOLIC PANEL WITH GFR
ALT: 106 U/L — ABNORMAL HIGH (ref 0–44)
AST: 40 U/L (ref 15–41)
Albumin: 2.9 g/dL — ABNORMAL LOW (ref 3.5–5.0)
Alkaline Phosphatase: 192 U/L — ABNORMAL HIGH (ref 38–126)
Anion gap: 9 (ref 5–15)
BUN: 10 mg/dL (ref 8–23)
CO2: 26 mmol/L (ref 22–32)
Calcium: 9 mg/dL (ref 8.9–10.3)
Chloride: 103 mmol/L (ref 98–111)
Creatinine, Ser: 1.33 mg/dL — ABNORMAL HIGH (ref 0.61–1.24)
GFR, Estimated: 58 mL/min — ABNORMAL LOW (ref >60)
Glucose, Bld: 124 mg/dL — ABNORMAL HIGH (ref 70–99)
Potassium: 3.9 mmol/L (ref 3.5–5.1)
Sodium: 138 mmol/L (ref 135–145)
Total Bilirubin: 0.9 mg/dL (ref 0.0–1.2)
Total Protein: 6.4 g/dL — ABNORMAL LOW (ref 6.5–8.1)

## 2023-08-27 MED ORDER — OXYCODONE HCL 10 MG PO TABS
10.0000 mg | ORAL_TABLET | Freq: Four times a day (QID) | ORAL | 0 refills | Status: AC | PRN
  Filled 2023-08-27: qty 10, 3d supply, fill #0

## 2023-08-27 NOTE — Progress Notes (Signed)
 Earl Minium, MD [facility]   [address], [address] [place], [place] [address] Phone: [phone] Fax : [phone]   Subjective: The patient is status post ERCP yesterday with a large amount of sludge removed from the common bile duct.  The sludge was occupying most of the common bile duct.  The patient reports some abdominal discomfort after the procedure but doing well now.  He also reports that he feels much better today than he did when he came in.   Objective: Vital signs in last 24 hours: Vitals:   08/26/23 1858 08/26/23 2040 08/27/23 0508 08/27/23 0753  BP: 133/70 132/76 135/71 132/67  Pulse: (!) 55 72 (!) 47 (!) 43  Resp:  18 20   Temp: 98 F (36.7 C) 98.4 F (36.9 C) 98.2 F (36.8 C) 98.2 F (36.8 C)  TempSrc:      SpO2: 98% 97% 98% 100%  Weight:      Height:       Weight change: 0 kg  Intake/Output Summary (Last 24 hours) at 08/27/2023 1308 Last data filed at 08/26/2023 1614 Gross per 24 hour  Intake 959.12 ml  Output 450 ml  Net 509.12 ml     Exam: Heart:: Regular rate and rhythm or without murmur or extra heart sounds Lungs: normal and clear to auscultation and percussion Abdomen: soft, nontender, normal bowel sounds   Lab Results: @LABTEST2 @ Micro Results: Recent Results (from the past 240 hours)  Culture, blood (Routine X 2) w Reflex to ID Panel     Status: None (Preliminary result)   Collection Time: 08/26/23  6:02 AM   Specimen: BLOOD RIGHT ARM  Result Value Ref Range Status   Specimen Description BLOOD RIGHT ARM  Final   Special Requests   Final    BOTTLES DRAWN AEROBIC AND ANAEROBIC Blood Culture adequate volume   Culture   Final    NO GROWTH < 24 HOURS Performed at [facility], [address] [place], [place] [address]    Report Status PENDING  Incomplete  Culture, blood (Routine X 2) w Reflex to ID Panel     Status: None (Preliminary result)   Collection Time: 08/26/23  6:02 AM   Specimen: BLOOD LEFT ARM  Result Value Ref  Range Status   Specimen Description BLOOD LEFT ARM  Final   Special Requests   Final    BOTTLES DRAWN AEROBIC AND ANAEROBIC Blood Culture results may not be optimal due to an inadequate volume of blood received in culture bottles   Culture   Final    NO GROWTH < 24 HOURS Performed at [facility], [address], [place], [place] [address]    Report Status PENDING  Incomplete   Studies/Results: DG C-Arm 1-60 Min-No Report Result Date: 08/26/2023 Fluoroscopy was utilized by the requesting physician.  No radiographic interpretation.   MR ABDOMEN MRCP W WO CONTAST Result Date: 08/25/2023 CLINICAL DATA:  Abdominal pain.  Elevated liver function tests. EXAM: MRI ABDOMEN WITHOUT AND WITH CONTRAST (INCLUDING MRCP) TECHNIQUE: Multiplanar multisequence MR imaging of the abdomen was performed both before and after the administration of intravenous contrast. Heavily T2-weighted images of the biliary and pancreatic ducts were obtained, and three-dimensional MRCP images were rendered by post processing. CONTRAST:  9mL GADAVIST GADOBUTROL 1 MMOL/ML IV SOLN COMPARISON:  11/25/2019 FINDINGS: Lower chest: No acute findings. Hepatobiliary: No hepatic masses identified. Prior cholecystectomy. Diffuse biliary ductal dilatation shows mild increase since prior MRI in 2021, with common bile duct measuring  20 mm in diameter. Smoothly tapered distal common bile duct stricture is seen near the ampulla. Numerous new calculi are seen throughout the common bile duct, measuring up to 1.7 cm in diameter. Low cystic duct insertion into the distal common bile duct also noted. Pancreas: No mass or inflammatory changes. No evidence of pancreatic ductal dilatation. Spleen:  Within normal limits in size and appearance. Adrenals/Urinary Tract: Benign Bosniak category 1 renal cysts are again seen bilaterally (No followup imaging is recommended). No suspicious masses identified. No evidence of hydronephrosis. Stomach/Bowel:  Unremarkable. Vascular/Lymphatic: No pathologically enlarged lymph nodes identified. No acute vascular findings. Other:  None. Musculoskeletal:  No suspicious bone lesions identified. IMPRESSION: Prior cholecystectomy. Diffuse biliary ductal dilatation with common bile duct measuring 20 mm, mildly increased since 2021 exam. Smoothly tapered distal common bile duct stricture, with numerous new common bile duct calculi measuring up to 1.7 cm in diameter. Electronically Signed   By: Danae Orleans M.D.   On: 08/25/2023 22:01   MR 3D Recon At Scanner Result Date: 08/25/2023 CLINICAL DATA:  Abdominal pain.  Elevated liver function tests. EXAM: MRI ABDOMEN WITHOUT AND WITH CONTRAST (INCLUDING MRCP) TECHNIQUE: Multiplanar multisequence MR imaging of the abdomen was performed both before and after the administration of intravenous contrast. Heavily T2-weighted images of the biliary and pancreatic ducts were obtained, and three-dimensional MRCP images were rendered by post processing. CONTRAST:  9mL GADAVIST GADOBUTROL 1 MMOL/ML IV SOLN COMPARISON:  11/25/2019 FINDINGS: Lower chest: No acute findings. Hepatobiliary: No hepatic masses identified. Prior cholecystectomy. Diffuse biliary ductal dilatation shows mild increase since prior MRI in 2021, with common bile duct measuring 20 mm in diameter. Smoothly tapered distal common bile duct stricture is seen near the ampulla. Numerous new calculi are seen throughout the common bile duct, measuring up to 1.7 cm in diameter. Low cystic duct insertion into the distal common bile duct also noted. Pancreas: No mass or inflammatory changes. No evidence of pancreatic ductal dilatation. Spleen:  Within normal limits in size and appearance. Adrenals/Urinary Tract: Benign Bosniak category 1 renal cysts are again seen bilaterally (No followup imaging is recommended). No suspicious masses identified. No evidence of hydronephrosis. Stomach/Bowel: Unremarkable. Vascular/Lymphatic: No  pathologically enlarged lymph nodes identified. No acute vascular findings. Other:  None. Musculoskeletal:  No suspicious bone lesions identified. IMPRESSION: Prior cholecystectomy. Diffuse biliary ductal dilatation with common bile duct measuring 20 mm, mildly increased since 2021 exam. Smoothly tapered distal common bile duct stricture, with numerous new common bile duct calculi measuring up to 1.7 cm in diameter. Electronically Signed   By: Danae Orleans M.D.   On: 08/25/2023 22:01   CT Angio Chest/Abd/Pel for Dissection W and/or W/WO Result Date: 08/25/2023 CLINICAL DATA:  Acute aortic syndrome (AAS) suspected abd pain, also considered mesenteric ischemia. Upper abdominal pain and right and central chest pain. EXAM: CT ANGIOGRAPHY CHEST, ABDOMEN AND PELVIS TECHNIQUE: Non-contrast CT of the chest was initially obtained. Multidetector CT imaging through the chest, abdomen and pelvis was performed using the standard protocol during bolus administration of intravenous contrast. Multiplanar reconstructed images and MIPs were obtained and reviewed to evaluate the vascular anatomy. RADIATION DOSE REDUCTION: This exam was performed according to the departmental dose-optimization program which includes automated exposure control, adjustment of the mA and/or kV according to patient size and/or use of iterative reconstruction technique. CONTRAST:  OMNIPAQUE IOHEXOL 350 MG/ML SOLN COMPARISON:  CT Angiography chest and CT scan abdomen and pelvis from 08/19/2023. FINDINGS: CTA CHEST FINDINGS Cardiovascular: No intramural hematoma noted in  the thoracic aorta on the unenhanced images. Thoracic aorta is normal in caliber without aneurysm, dissection, vasculitis or significant stenosis. There is also satisfactory opacification of pulmonary arteries. No embolism seen to the proximal subsegmental pulmonary artery level. Normal cardiac size. No pericardial effusion. No aortic aneurysm. There are coronary artery  calcifications, in keeping with coronary artery disease. There are also mild peripheral atherosclerotic vascular calcifications of thoracic aorta and its major branches. Mediastinum/Nodes: Visualized thyroid gland appears grossly unremarkable. No solid / cystic mediastinal masses. The esophagus is nondistended precluding optimal assessment. No axillary, mediastinal or hilar lymphadenopathy by size criteria. Lungs/Pleura: The central tracheo-bronchial tree is patent. No mass or consolidation. No pleural effusion or pneumothorax. No suspicious lung nodules. Musculoskeletal: The visualized soft tissues of the chest wall are grossly unremarkable. No suspicious osseous lesions. There are mild multilevel degenerative changes in the visualized spine. Review of the MIP images confirms the above findings. CTA ABDOMEN AND PELVIS FINDINGS VASCULAR Aorta: Normal caliber aorta without aneurysm, dissection, vasculitis or significant stenosis. Celiac: Patent without evidence of aneurysm, dissection, vasculitis or significant stenosis. SMA: Patent without evidence of aneurysm, dissection, vasculitis or significant stenosis. Renals: Both renal arteries are patent without evidence of aneurysm, dissection, vasculitis, fibromuscular dysplasia or significant stenosis. IMA: Patent without evidence of aneurysm, dissection, vasculitis or significant stenosis. Inflow: Patent without evidence of aneurysm, dissection, vasculitis or significant stenosis. Veins: No obvious venous abnormality within the limitations of this arterial phase study. Review of the MIP images confirms the above findings. NON-VASCULAR Hepatobiliary: The liver is normal in size. Non-cirrhotic configuration. No suspicious mass. No intrahepatic or extrahepatic bile duct dilation. Redemonstration of small amount of pneumobilia medially the left low, decreased since the prior study. Gallbladder is surgically absent. Pancreas: Evaluation of pancreatic head/neck and proximal  body is limited due to extensive streak artifacts from spinal fixation hardware. However, no significant pancreatic abnormality seen. Spleen: Within normal limits. No focal lesion. Adrenals/Urinary Tract: Adrenal glands are unremarkable. No suspicious renal mass within the limitations of exam due to extensive streak artifacts from spinal fixation hardware. There are at least 2 simple cysts in the left kidney, essentially similar to the prior study. No nephroureterolithiasis or obstructive uropathy. Unremarkable urinary bladder. Stomach/Bowel: No disproportionate dilation of the small or large bowel loops. No evidence of abnormal bowel wall thickening or inflammatory changes. The appendix is unremarkable. Vascular/Lymphatic: No ascites or pneumoperitoneum. No abdominal or pelvic lymphadenopathy, by size criteria. No aneurysmal dilation of the major abdominal arteries. There are mild peripheral atherosclerotic vascular calcifications of the aorta and its major branches. Reproductive: Normal size prostate. Symmetric seminal vesicles. Other: The visualized soft tissues and abdominal wall are unremarkable. Musculoskeletal: No suspicious osseous lesions. There are mild multilevel degenerative changes in the visualized spine. Redemonstration of thoracolumbar spinal fixation hardware. Review of the MIP images confirms the above findings. IMPRESSION: 1. Examination is mildly degraded by extensive streak artifacts from thoracolumbar spinal fixation hardware. 2. No acute aortic syndrome. No acute thoracic aortic intramural hematoma. No thoracoabdominal aortic aneurysm, dissection, penetrating atherosclerotic ulcer or vasculitis. 3. No acute inflammatory process identified within the chest, abdomen or pelvis. 4. Multiple other nonacute observations, as described above. Aortic Atherosclerosis (ICD10-I70.0). Electronically Signed   By: Jules Schick M.D.   On: 08/25/2023 17:07   DG Chest 2 View Result Date: 08/25/2023 CLINICAL  DATA:  Chest pressure. EXAM: CHEST - 2 VIEW COMPARISON:  CTA chest dated 08/19/2023. Chest radiograph dated 08/09/2022. FINDINGS: Stable cardiomediastinal silhouette. No focal consolidation, pleural effusion, or pneumothorax.  No acute osseous abnormality. Partially visualized thoracolumbar fixation hardware. IMPRESSION: No acute cardiopulmonary findings. Electronically Signed   By: Hart Robinsons M.D.   On: 08/25/2023 13:42   Medications: I have reviewed the patient's current medications. Scheduled Meds:  amLODipine  10 mg Oral Daily   apixaban  5 mg Oral BID   diclofenac  100 mg Rectal Once   losartan  100 mg Oral Daily   pantoprazole  40 mg Oral Daily   Continuous Infusions:  piperacillin-tazobactam (ZOSYN)  IV 3.375 g (08/27/23 0534)   PRN Meds:.HYDROmorphone (DILAUDID) injection, ondansetron (ZOFRAN) IV, oxyCODONE, tiZANidine   Assessment: Principal Problem:   Choledocholithiasis Active Problems:   Hypertension   DVT (deep venous thrombosis) (HCC)   Elevated LFTs   Personal history of pulmonary embolism   Chest pain   Elevated lactic acid level   Transaminitis    Plan: This patient is status post ERCP with a large amount of sludge removed from the common bile.  The patient's ERCP did not require sphincterotomy due to the ampulla being widely patent.  The patient will have his diet.  Nothing further to do from a GI point of view.  The patient has been told to contact me if he starts to have similar symptoms in the future requiring a repeat ERCP.    LOS: 1 day   Earl Minium, MD.FACG 08/27/2023, 9:03 AM Pager [phone] 7am-5pm  Check AMION for 5pm -7am coverage and on weekends

## 2023-08-27 NOTE — Discharge Instructions (Signed)
 Please make an appointment to follow up with your PCP and recheck your liver labs within 1-2 weeks. Try to avoid high fat/ greasy/ fried foods to prevent further issues

## 2023-08-31 LAB — CULTURE, BLOOD (ROUTINE X 2)
Culture: NO GROWTH
Culture: NO GROWTH
Special Requests: ADEQUATE

## 2023-09-17 ENCOUNTER — Observation Stay: Admitting: Anesthesiology

## 2023-09-17 ENCOUNTER — Emergency Department

## 2023-09-17 ENCOUNTER — Encounter: Payer: Self-pay | Admitting: Emergency Medicine

## 2023-09-17 ENCOUNTER — Observation Stay

## 2023-09-17 ENCOUNTER — Encounter
Admission: EM | Disposition: A | Discharge status: Home / Self Care | Payer: Self-pay | Source: Home / Self Care | Attending: Emergency Medicine

## 2023-09-17 ENCOUNTER — Other Ambulatory Visit: Payer: Self-pay

## 2023-09-17 ENCOUNTER — Observation Stay
Admission: EM | Admit: 2023-09-17 | Discharge: 2023-09-18 | Disposition: A | Discharge status: Home / Self Care | Attending: Internal Medicine | Admitting: Internal Medicine

## 2023-09-17 DIAGNOSIS — R1013 Epigastric pain: Principal | ICD-10-CM | POA: Insufficient documentation

## 2023-09-17 DIAGNOSIS — R0789 Other chest pain: Secondary | ICD-10-CM | POA: Diagnosis present

## 2023-09-17 DIAGNOSIS — Z8551 Personal history of malignant neoplasm of bladder: Secondary | ICD-10-CM | POA: Diagnosis not present

## 2023-09-17 DIAGNOSIS — I10 Essential (primary) hypertension: Secondary | ICD-10-CM | POA: Diagnosis present

## 2023-09-17 DIAGNOSIS — K838 Other specified diseases of biliary tract: Secondary | ICD-10-CM | POA: Insufficient documentation

## 2023-09-17 DIAGNOSIS — Z79899 Other long term (current) drug therapy: Secondary | ICD-10-CM | POA: Insufficient documentation

## 2023-09-17 DIAGNOSIS — F17210 Nicotine dependence, cigarettes, uncomplicated: Secondary | ICD-10-CM | POA: Insufficient documentation

## 2023-09-17 DIAGNOSIS — Z86718 Personal history of other venous thrombosis and embolism: Secondary | ICD-10-CM | POA: Insufficient documentation

## 2023-09-17 DIAGNOSIS — T413X1A Poisoning by local anesthetics, accidental (unintentional), initial encounter: Secondary | ICD-10-CM | POA: Insufficient documentation

## 2023-09-17 DIAGNOSIS — G4733 Obstructive sleep apnea (adult) (pediatric): Secondary | ICD-10-CM | POA: Insufficient documentation

## 2023-09-17 DIAGNOSIS — K8050 Calculus of bile duct without cholangitis or cholecystitis without obstruction: Secondary | ICD-10-CM | POA: Insufficient documentation

## 2023-09-17 DIAGNOSIS — I2699 Other pulmonary embolism without acute cor pulmonale: Secondary | ICD-10-CM | POA: Diagnosis present

## 2023-09-17 DIAGNOSIS — Z86711 Personal history of pulmonary embolism: Secondary | ICD-10-CM | POA: Insufficient documentation

## 2023-09-17 DIAGNOSIS — N1831 Chronic kidney disease, stage 3a: Secondary | ICD-10-CM | POA: Insufficient documentation

## 2023-09-17 DIAGNOSIS — E785 Hyperlipidemia, unspecified: Secondary | ICD-10-CM | POA: Insufficient documentation

## 2023-09-17 DIAGNOSIS — F1421 Cocaine dependence, in remission: Secondary | ICD-10-CM | POA: Diagnosis present

## 2023-09-17 DIAGNOSIS — Z720 Tobacco use: Secondary | ICD-10-CM | POA: Diagnosis present

## 2023-09-17 DIAGNOSIS — I129 Hypertensive chronic kidney disease with stage 1 through stage 4 chronic kidney disease, or unspecified chronic kidney disease: Secondary | ICD-10-CM | POA: Diagnosis not present

## 2023-09-17 DIAGNOSIS — K219 Gastro-esophageal reflux disease without esophagitis: Secondary | ICD-10-CM | POA: Insufficient documentation

## 2023-09-17 HISTORY — PX: ERCP: SHX5425

## 2023-09-17 LAB — TROPONIN I (HIGH SENSITIVITY)
Troponin I (High Sensitivity): 14 ng/L (ref <18)
Troponin I (High Sensitivity): 13 ng/L (ref <18)

## 2023-09-17 LAB — BASIC METABOLIC PANEL WITH GFR
Anion gap: 8 (ref 5–15)
BUN: 14 mg/dL (ref 8–23)
CO2: 24 mmol/L (ref 22–32)
Calcium: 8.9 mg/dL (ref 8.9–10.3)
Chloride: 106 mmol/L (ref 98–111)
Creatinine, Ser: 1.25 mg/dL — ABNORMAL HIGH (ref 0.61–1.24)
GFR, Estimated: >60 mL/min (ref >60)
Glucose, Bld: 140 mg/dL — ABNORMAL HIGH (ref 70–99)
Potassium: 3 mmol/L — ABNORMAL LOW (ref 3.5–5.1)
Sodium: 138 mmol/L (ref 135–145)

## 2023-09-17 LAB — HEPATIC FUNCTION PANEL
ALT: 43 U/L (ref 0–44)
AST: 64 U/L — ABNORMAL HIGH (ref 15–41)
Albumin: 3.5 g/dL (ref 3.5–5.0)
Alkaline Phosphatase: 118 U/L (ref 38–126)
Bilirubin, Direct: 0.8 mg/dL — ABNORMAL HIGH (ref 0.0–0.2)
Indirect Bilirubin: 1.1 mg/dL — ABNORMAL HIGH (ref 0.3–0.9)
Total Bilirubin: 1.9 mg/dL — ABNORMAL HIGH (ref 0.0–1.2)
Total Protein: 6.7 g/dL (ref 6.5–8.1)

## 2023-09-17 LAB — CBC
HCT: 37.8 % — ABNORMAL LOW (ref 39.0–52.0)
Hemoglobin: 12.7 g/dL — ABNORMAL LOW (ref 13.0–17.0)
MCH: 30.2 pg (ref 26.0–34.0)
MCHC: 33.6 g/dL (ref 30.0–36.0)
MCV: 90 fL (ref 80.0–100.0)
Platelets: 221 10*3/uL (ref 150–400)
RBC: 4.2 MIL/uL — ABNORMAL LOW (ref 4.22–5.81)
RDW: 15.9 % — ABNORMAL HIGH (ref 11.5–15.5)
WBC: 13.5 10*3/uL — ABNORMAL HIGH (ref 4.0–10.5)
nRBC: 0 % (ref 0.0–0.2)

## 2023-09-17 LAB — LIPASE, BLOOD: Lipase: 45 U/L (ref 11–51)

## 2023-09-17 SURGERY — ERCP, WITH INTERVENTION IF INDICATED
Anesthesia: General

## 2023-09-17 MED ORDER — FENTANYL CITRATE (PF) 100 MCG/2ML IJ SOLN
INTRAMUSCULAR | Status: AC
  Filled 2023-09-17: qty 2

## 2023-09-17 MED ORDER — ATORVASTATIN CALCIUM 20 MG PO TABS
20.0000 mg | ORAL_TABLET | Freq: Every evening | ORAL | Status: DC
  Administered 2023-09-17: 20 mg via ORAL
  Filled 2023-09-17: qty 1

## 2023-09-17 MED ORDER — POTASSIUM CHLORIDE CRYS ER 20 MEQ PO TBCR
40.0000 meq | EXTENDED_RELEASE_TABLET | Freq: Once | ORAL | Status: AC
  Administered 2023-09-17: 40 meq via ORAL
  Filled 2023-09-17: qty 2

## 2023-09-17 MED ORDER — PROPOFOL 10 MG/ML IV BOLUS
INTRAVENOUS | Status: AC
  Filled 2023-09-17: qty 20

## 2023-09-17 MED ORDER — HYDROMORPHONE HCL 1 MG/ML IJ SOLN
0.5000 mg | INTRAMUSCULAR | Status: AC
  Administered 2023-09-17: 0.5 mg via INTRAVENOUS
  Filled 2023-09-17: qty 0.5

## 2023-09-17 MED ORDER — ONDANSETRON HCL 4 MG/2ML IJ SOLN: 4.0000 mg | Freq: Four times a day (QID) | INTRAMUSCULAR | Status: DC | PRN

## 2023-09-17 MED ORDER — SODIUM CHLORIDE 0.9 % IV SOLN: INTRAVENOUS | Status: DC

## 2023-09-17 MED ORDER — GADOBUTROL 1 MMOL/ML IV SOLN
9.0000 mL | Freq: Once | INTRAVENOUS | Status: AC | PRN
  Administered 2023-09-17: 9 mL via INTRAVENOUS

## 2023-09-17 MED ORDER — IOHEXOL 350 MG/ML SOLN
100.0000 mL | Freq: Once | INTRAVENOUS | Status: AC | PRN
  Administered 2023-09-17: 100 mL via INTRAVENOUS

## 2023-09-17 MED ORDER — ONDANSETRON HCL 4 MG PO TABS: 4.0000 mg | ORAL_TABLET | Freq: Four times a day (QID) | ORAL | Status: DC | PRN

## 2023-09-17 MED ORDER — LIDOCAINE HCL (CARDIAC) PF 100 MG/5ML IV SOSY
PREFILLED_SYRINGE | INTRAVENOUS | Status: DC | PRN
Start: 2023-09-17 — End: 2023-09-17
  Administered 2023-09-17: 50 mg via INTRAVENOUS

## 2023-09-17 MED ORDER — LACTATED RINGERS IV SOLN: INTRAVENOUS | Status: DC | PRN

## 2023-09-17 MED ORDER — ORAL CARE MOUTH RINSE: 15.0000 mL | OROMUCOSAL | Status: DC | PRN

## 2023-09-17 MED ORDER — ENOXAPARIN SODIUM 40 MG/0.4ML IJ SOSY: 40.0000 mg | PREFILLED_SYRINGE | INTRAMUSCULAR | Status: DC

## 2023-09-17 MED ORDER — DEXAMETHASONE SODIUM PHOSPHATE 10 MG/ML IJ SOLN
INTRAMUSCULAR | Status: DC | PRN
  Administered 2023-09-17: 5 mg via INTRAVENOUS

## 2023-09-17 MED ORDER — PROPOFOL 10 MG/ML IV BOLUS
INTRAVENOUS | Status: DC | PRN
  Administered 2023-09-17: 200 mg via INTRAVENOUS
  Administered 2023-09-17: 100 mg via INTRAVENOUS
  Administered 2023-09-17: 140 ug/kg/min via INTRAVENOUS

## 2023-09-17 MED ORDER — AMLODIPINE BESYLATE 10 MG PO TABS
10.0000 mg | ORAL_TABLET | Freq: Every day | ORAL | Status: DC
  Administered 2023-09-17 – 2023-09-18 (×2): 10 mg via ORAL
  Filled 2023-09-17 (×2): qty 1

## 2023-09-17 MED ORDER — PAROXETINE HCL 20 MG PO TABS
20.0000 mg | ORAL_TABLET | ORAL | Status: DC
  Administered 2023-09-18: 20 mg via ORAL
  Filled 2023-09-17 (×2): qty 1

## 2023-09-17 MED ORDER — SUCCINYLCHOLINE CHLORIDE 200 MG/10ML IV SOSY
PREFILLED_SYRINGE | INTRAVENOUS | Status: DC | PRN
  Administered 2023-09-17: 120 mg via INTRAVENOUS

## 2023-09-17 MED ORDER — LACTATED RINGERS IV BOLUS
1000.0000 mL | Freq: Once | INTRAVENOUS | Status: AC
  Administered 2023-09-17 (×2): 1000 mL via INTRAVENOUS

## 2023-09-17 MED ORDER — POTASSIUM CHLORIDE 10 MEQ/100ML IV SOLN
10.0000 meq | INTRAVENOUS | Status: AC
Start: 2023-09-17 — End: 2023-09-17
  Administered 2023-09-17 (×2): 10 meq via INTRAVENOUS
  Filled 2023-09-17 (×3): qty 100

## 2023-09-17 MED ORDER — TIZANIDINE HCL 4 MG PO TABS: 8.0000 mg | ORAL_TABLET | Freq: Every day | ORAL | Status: DC | PRN

## 2023-09-17 MED ORDER — FENTANYL CITRATE PF 50 MCG/ML IJ SOSY
50.0000 ug | PREFILLED_SYRINGE | INTRAMUSCULAR | Status: DC | PRN
  Administered 2023-09-17: 50 ug via INTRAVENOUS

## 2023-09-17 MED ORDER — HYDROMORPHONE HCL 1 MG/ML IJ SOLN
0.5000 mg | INTRAMUSCULAR | Status: DC | PRN
  Administered 2023-09-17 – 2023-09-18 (×5): 0.5 mg via INTRAVENOUS
  Filled 2023-09-17 (×5): qty 0.5

## 2023-09-17 MED ORDER — ONDANSETRON HCL 4 MG/2ML IJ SOLN
4.0000 mg | Freq: Once | INTRAMUSCULAR | Status: AC | PRN
  Administered 2023-09-17: 4 mg via INTRAVENOUS

## 2023-09-17 MED ORDER — APIXABAN 5 MG PO TABS
5.0000 mg | ORAL_TABLET | Freq: Two times a day (BID) | ORAL | Status: DC
  Administered 2023-09-17 – 2023-09-18 (×3): 5 mg via ORAL
  Filled 2023-09-17 (×3): qty 1

## 2023-09-17 MED ORDER — NICOTINE 7 MG/24HR TD PT24
7.0000 mg | MEDICATED_PATCH | Freq: Every day | TRANSDERMAL | Status: DC
  Filled 2023-09-17: qty 1

## 2023-09-17 MED ORDER — FENTANYL CITRATE PF 50 MCG/ML IJ SOSY
PREFILLED_SYRINGE | INTRAMUSCULAR | Status: AC
  Filled 2023-09-17: qty 1

## 2023-09-17 MED ORDER — PHENYLEPHRINE 80 MCG/ML (10ML) SYRINGE FOR IV PUSH (FOR BLOOD PRESSURE SUPPORT)
PREFILLED_SYRINGE | INTRAVENOUS | Status: DC | PRN
  Administered 2023-09-17 (×2): 80 ug via INTRAVENOUS

## 2023-09-17 MED ORDER — ACETAMINOPHEN 325 MG PO TABS: 650.0000 mg | ORAL_TABLET | Freq: Four times a day (QID) | ORAL | Status: DC | PRN

## 2023-09-17 MED ORDER — FENTANYL CITRATE (PF) 100 MCG/2ML IJ SOLN
INTRAMUSCULAR | Status: DC | PRN
  Administered 2023-09-17: 100 ug via INTRAVENOUS

## 2023-09-17 NOTE — Assessment & Plan Note (Signed)
 Cr 1.25 w/ GFR >60

## 2023-09-17 NOTE — Assessment & Plan Note (Signed)
 PPI ?

## 2023-09-17 NOTE — Op Note (Signed)
 [facility] Patient Name: Earl Murray Procedure Date: 09/17/2023 12:17 PM MRN: [id number] Account #: [id number] Date of Birth: 23-Apr-1956 Admit Type: Inpatient Age: 68 Room: [facility] ENDO ROOM 4 Gender: Male Note Status: Finalized Instrument Name: [name] [id number] Procedure:             ERCP Indications:           Common bile duct stone(s) Providers:             Marnee Sink MD, MD Referring MD:          No Local Md, MD (Referring MD) Medicines:             General Anesthesia Complications:         No immediate complications. Procedure:             Pre-Anesthesia Assessment:                        - Prior to the procedure, a History and Physical was                         performed, and patient medications and allergies were                         reviewed. The patient's tolerance of previous                         anesthesia was also reviewed. The risks and benefits                         of the procedure and the sedation options and risks                         were discussed with the patient. All questions were                         answered, and informed consent was obtained. Prior                         Anticoagulants: The patient has taken no anticoagulant                         or antiplatelet agents. ASA Grade Assessment: II - A                         patient with mild systemic disease. After reviewing                         the risks and benefits, the patient was deemed in                         satisfactory condition to undergo the procedure.                        After obtaining informed consent, the scope was passed                         under direct vision. Throughout the procedure, the  patient's blood pressure, pulse, and oxygen                         saturations were monitored continuously. The                         Duodenoscope was introduced through the mouth, and                         used to  inject contrast into and used to inject                         contrast into the bile duct. The ERCP was accomplished                         without difficulty. The patient tolerated the                         procedure well. Findings:      A scout film of the abdomen was obtained. Surgical clips, consistent       with a previous cholecystectomy, were seen in the area of the right       upper quadrant of the abdomen. The esophagus was successfully intubated       under direct vision. The scope was advanced to a normal major papilla in       the descending duodenum without detailed examination of the pharynx,       larynx and associated structures, and upper GI tract. The upper GI tract       was grossly normal. The bile duct was deeply cannulated with the       short-nosed traction sphincterotome. Contrast was injected. I personally       interpreted the bile duct images. There was brisk flow of contrast       through the ducts. Image quality was excellent. Contrast extended to the       entire biliary tree. The main bile duct and common bile duct were       diffusely dilated. The left main hepatic duct contained one stone, which       was large in diameter. A wire was passed into the biliary tree. The       biliary tree was swept with a basket starting at the bifurcation. Sludge       was swept from the duct. No stones were removed. No stones remained. The       biliary tree was swept with a 15 mm balloon starting at the bifurcation.       Sludge was swept from the duct. All stones were removed. Impression:            - The entire main bile duct and common bile duct were                         dilated.                        - Choledocholithiasis was found. Complete removal was                         accomplished by balloon extraction.                        -  The biliary tree was swept. Recommendation:        - Return patient to hospital ward for ongoing care.                         - Watch for pancreatitis, bleeding, perforation, and                         cholangitis.                        - Resume regular diet. Procedure Code(s):     --- Professional ---                        [phone], Endoscopic retrograde cholangiopancreatography                         (ERCP); with removal of calculi/debris from                         biliary/pancreatic duct(s)                        [phone], Endoscopic catheterization of the biliary                         ductal system, radiological supervision and                         interpretation Diagnosis Code(s):     --- Professional ---                        K80.50, Calculus of bile duct without cholangitis or                         cholecystitis without obstruction                        K83.8, Other specified diseases of biliary tract CPT copyright 2022 [employer]. All rights reserved. The codes documented in this report are preliminary and upon coder review may  be revised to meet current compliance requirements. Marnee Sink MD, MD 09/17/2023 1:17:35 PM This report has been signed electronically. Number of Addenda: 0 Note Initiated On: 09/17/2023 12:17 PM Estimated Blood Loss:  Estimated blood loss: none.      [facility]

## 2023-09-17 NOTE — ED Notes (Signed)
 CCMD notified for cardiac monitoring. Spoke with [employer].

## 2023-09-17 NOTE — ED Triage Notes (Addendum)
 To ER from home with report of central chest pain that began around 5p yesterday. Reports painful to breath. Denies any nausea.  Reports chronic blood clot to LLE and on eliquis  but stopped 2 weeks ago due to it making him cold.    No relief from 324mg  ASA, 2 nitroglycerin  spray

## 2023-09-17 NOTE — Assessment & Plan Note (Signed)
 Cont statin

## 2023-09-17 NOTE — H&P (Addendum)
 History and Physical    Patient: Earl Murray UJW:[id number] DOB: [birth year] DOA: 09/17/2023 DOS: the patient was seen and examined on 09/17/2023 PCP: [facility]  Patient coming from: Home  Chief Complaint:  Chief Complaint  Patient presents with   Chest Pain   HPI: Earl Murray is a 68 y.o. male with medical history significant of s/p of cholecystectomy, HTN, HLD, prediabetes, GERD, depression, PE on Eliquis , OSA, former smoker, cocaine abuse in remission, pancreatitis, kidney stone, bladder cancer, chronic low back pain, choledocholithiasis presenting w/ choledocholithiasis.  Patient noted to have been admitted April 2 through April 4 for issues including choledocholithiasis.  Had MRCP showing numerous new common bile duct stones measuring up to 1.7 cm.  Had a ERCP which had removal of stones and resulted in patent common bile duct.  Patient reports mild improvement in pain after discharge.  Has had worsening pain over the past 1 to 2 days.  Pain predominately in the epigastric/periumbilical region.  Pain moderate in intensity.  No nausea.  No fevers or chills.  No diarrhea.  No chest pain or shortness of breath.  Pain in similar distribution to last presentation.  Still smoking around 1/4 pack/day.  Nuys any recent alcohol or illicit drug use.+ compliance with home eliquis  use.  Presented to the ER afebrile, hemodynamically stable.  Satting well on room air.  White count 13.5, hemoglobin 12.7, platelets 221, creatinine 1.25.  Potassium 3.0.  T. bili 1.9.  AST 64.  ALT 43. CTA chest negative for PE. MRCP showing 1.8 by 1.1 by 0.9 cm common hepatic duct stone (previously on 08/25/2023 there were at least 7 similar sized stones in the common bile duct and common hepatic duct). Mildly blunted/truncated distal CBD making it difficult to exclude distal choledocholithiasis. + prior sphincterotomy. Dr. Ole Berkeley with GI made aware of case.  Review of Systems: As mentioned in the history of present  illness. All other systems reviewed and are negative. Past Medical History:  Diagnosis Date   Bladder cancer (HCC)    Choledocholithiasis    Chronic kidney disease    Cocaine dependence in remission (HCC)    Concussion    Depression    DVT (deep venous thrombosis) (HCC)    GERD (gastroesophageal reflux disease)    Hepatitis C    History of cancer    Hyperlipidemia    Hypertension    Migraine    Neck injury    Opioid dependence in remission (HCC)    Pancreatitis    Prediabetes    Scoliosis    Sleep apnea    Past Surgical History:  Procedure Laterality Date   BACK SURGERY     CHOLECYSTECTOMY     ELBOW SURGERY     ERCP N/A 10/27/2019   Procedure: ENDOSCOPIC RETROGRADE CHOLANGIOPANCREATOGRAPHY (ERCP);  Surgeon: Marnee Sink, MD;  Location: [facility] ENDOSCOPY;  Service: Endoscopy;  Laterality: N/A;   ERCP N/A 08/26/2023   Procedure: ERCP, WITH INTERVENTION IF INDICATED;  Surgeon: Marnee Sink, MD;  Location: [facility] ENDOSCOPY;  Service: Endoscopy;  Laterality: N/A;   FOOT SURGERY     HERNIA REPAIR     4 hernia repairs   PULMONARY THROMBECTOMY N/A 10/14/2021   Procedure: PULMONARY THROMBECTOMY;  Surgeon: Jackquelyn Mass, MD;  Location: [facility] INVASIVE CV LAB;  Service: Cardiovascular;  Laterality: N/A;   Social History:  reports that he has been smoking cigarettes. He has never used smokeless tobacco. He reports current alcohol use. He reports that he does not currently use  drugs.  Allergies  Allergen Reactions   Gabapentin  Other (See Comments)    sedation   Toradol  [Ketorolac  Tromethamine ] Itching    Per pt it makes him itch.    Motrin [Ibuprofen] Itching and Other (See Comments)   Tramadol Rash   Tylenol  With Codeine #3 [Acetaminophen -Codeine] Itching    Family History  Problem Relation Age of Onset   Hypertension Mother    Heart disease Mother    Colon cancer Father     Prior to Admission medications   Medication Sig Start Date End Date Taking? Authorizing Provider   amLODipine  (NORVASC ) 10 MG tablet Take 10 mg by mouth daily.   Yes [provider]  apixaban  (ELIQUIS ) 5 MG TABS tablet Take 5 mg by mouth 2 (two) times daily. 12/08/22  Yes [provider]  atorvastatin  (LIPITOR) 40 MG tablet Take 20 mg by mouth every evening. 08/23/23  Yes [provider]  chlorthalidone (HYGROTON) 25 MG tablet Take 25 mg by mouth daily. 08/23/23  Yes [provider]  DULoxetine (CYMBALTA) 30 MG capsule Take 30 mg by mouth daily.   Yes [provider]  HYDROPHILIC EX Apply 1 Application topically daily as needed (dry skin). 06/08/23  Yes [provider]  hydrOXYzine (ATARAX) 25 MG tablet Take 25 mg by mouth 2 (two) times daily as needed for anxiety. 08/23/23  Yes [provider]  losartan  (COZAAR ) 100 MG tablet Take 100 mg by mouth daily. 08/19/23 11/17/23 Yes [provider]  losartan  (COZAAR ) 25 MG tablet Take 1 tablet by mouth daily. 08/23/23  Yes [provider]  MENTHOL-METHYL SALICYLATE EX Apply 1 Application topically 4 (four) times daily as needed (pain relief). 06/08/23  Yes [provider]  naloxone (NARCAN) nasal spray 4 mg/0.1 mL Place 1 spray into the nose once as needed (opiod overdose).   Yes [provider]  OXYCONTIN  10 MG 12 hr tablet Take 10 mg by mouth 2 (two) times daily. 09/01/23  Yes [provider]  PARoxetine  (PAXIL ) 40 MG tablet Take 20 mg by mouth every morning. 08/23/23  Yes [provider]  tapentadol (NUCYNTA) 100 MG 12 hr tablet Take 100 mg by mouth 2 (two) times daily. 08/30/23  Yes [provider]  tiZANidine  (ZANAFLEX ) 4 MG tablet Take 2 tablets (8 mg total) by mouth daily as needed for muscle spasms. 08/20/23  Yes Verla Glaze, MD  gabapentin  (NEURONTIN ) 300 MG capsule gabapentin  300 mg capsule  one tab bid-tid  03/01/19  [provider]  ipratropium (ATROVENT ) 0.06 % nasal spray Place 2 sprays into both nostrils 4 (four)  times daily as needed for rhinitis. 10/22/19 03/16/20  Gray, Bryan E, NP  levocetirizine (XYZAL ) 5 MG tablet Take 1 tablet (5 mg total) by mouth every evening. 09/30/19 03/16/20  Myrna Ast, DO    Physical Exam: Vitals:   09/17/23 0530 09/17/23 0803 09/17/23 0930 09/17/23 1030  BP: 138/84  (!) 144/90 (!) 154/82  Pulse: (!) 51  62 (!) 59  Resp: (!) 21   12  Temp:  98 F (36.7 C)    TempSrc:  Oral    SpO2: 100%  97% 100%  Weight:      Height:       Physical Exam Constitutional:      Appearance: He is normal weight.  HENT:     Head: Normocephalic and atraumatic.     Nose: Nose normal.     Mouth/Throat:     Mouth: Mucous membranes are  moist.  Eyes:     Pupils: Pupils are equal, round, and reactive to light.  Cardiovascular:     Rate and Rhythm: Normal rate and regular rhythm.  Pulmonary:     Effort: Pulmonary effort is normal.  Abdominal:     General: Bowel sounds are normal.  Musculoskeletal:        General: Normal range of motion.  Skin:    General: Skin is warm.  Neurological:     General: No focal deficit present.  Psychiatric:        Mood and Affect: Mood normal.     Data Reviewed:  There are no new results to review at this time.  DG C-Arm 1-60 Min-No Report Fluoroscopy was utilized by the requesting physician.  No radiographic  interpretation.  MR 3D Recon At Scanner CLINICAL DATA:  Epigastric pain, recent choledocholithiasis.  EXAM: MRI ABDOMEN WITHOUT AND WITH CONTRAST (INCLUDING MRCP)  TECHNIQUE: Multiplanar multisequence MR imaging of the abdomen was performed both before and after the administration of intravenous contrast. Heavily T2-weighted images of the biliary and pancreatic ducts were obtained, and three-dimensional MRCP images were rendered by post processing. Post-processing was applied at the acquisition scanner with concurrent physician supervision which includes 3D reconstructions, MIPs, volume rendered images and/or shaded  surface rendering.  CONTRAST:  9mL GADAVIST  GADOBUTROL  1 MMOL/ML IV SOLN  COMPARISON:  CT angiogram 09/17/2023 MRCP from 08/25/2023  FINDINGS: Lower chest: Unremarkable  Hepatobiliary: Cholecystectomy. Dilated common hepatic duct at 1.9 cm in dilated common bile duct at 1.6 cm mild proximal intrahepatic biliary dilatation. Pneumobilia is observed along with a 1.8 by 1.1 by 0.9 cm common hepatic duct stone on image 7 series 15 (previously on 08/25/2023 there were at least 7 similar sized stones in the common bile duct and common hepatic duct). Mildly dilated cystic duct remnant. Mildly blunted/truncated distal CBD for example on image 43 series 13 making it difficult to exclude distal choledocholithiasis.  Heterogeneous enhancement in the parenchyma in the arterial phase with some geographic reduced density in most of the left hepatic lobe on late arterial phase images. The degree of enhancement even is out on portal venous and delayed phase images. No findings of hepatic vein thrombosis or portal vein thrombosis  Pancreas:  Unremarkable  Spleen:  Unremarkable  Adrenals/Urinary Tract: Benign renal cysts warrant no further follow up. Adrenal glands unremarkable.  Stomach/Bowel: Unremarkable  Vascular/Lymphatic: Likely reactive lymph nodes in the right gastric and porta hepatis regions.  Other:  No supplemental non-categorized findings.  Musculoskeletal: Posterolateral rod and pedicle screw fixation extending from T10 through S1 and also with iliac bone screws, but with lack of pedicle screw attachment at the compressed L4 level.  IMPRESSION: 1. Choledocholithiasis with a single 1.8 by 1.1 by 0.9 cm common hepatic duct stone (previously on 08/25/2023 there were at least 7 similar sized stones in the common bile duct and common hepatic duct). Mildly blunted/truncated distal CBD making it difficult to exclude distal choledocholithiasis, although this could also be due to  swelling at the ampulla. 2. Pneumobilia is observed compatible with prior sphincterotomy and persistent biliary dilatation along with mild proximal intrahepatic biliary dilatation. 3. Heterogeneous enhancement in the parenchyma in the arterial phase with some geographic reduced density in most of the left hepatic lobe on late arterial phase images. The degree of enhancement even is out on portal venous and delayed phase images. Although this is relatively mild, the possibility of low-grade vasculitis or low-grade hepatic inflammation resulting in heterogeneous early enhancement  is suggested. 4. Likely reactive lymph nodes in the right gastric and porta hepatis regions. 5. Posterolateral rod and pedicle screw fixation extending from T10 through S1 and also with iliac bone screws, but with lack of pedicle screw attachment at the compressed L4 level.  Electronically Signed   By: Freida Jes M.D.   On: 09/17/2023 09:03 MR ABDOMEN MRCP W WO CONTAST CLINICAL DATA:  Epigastric pain, recent choledocholithiasis.  EXAM: MRI ABDOMEN WITHOUT AND WITH CONTRAST (INCLUDING MRCP)  TECHNIQUE: Multiplanar multisequence MR imaging of the abdomen was performed both before and after the administration of intravenous contrast. Heavily T2-weighted images of the biliary and pancreatic ducts were obtained, and three-dimensional MRCP images were rendered by post processing. Post-processing was applied at the acquisition scanner with concurrent physician supervision which includes 3D reconstructions, MIPs, volume rendered images and/or shaded surface rendering.  CONTRAST:  9mL GADAVIST  GADOBUTROL  1 MMOL/ML IV SOLN  COMPARISON:  CT angiogram 09/17/2023 MRCP from 08/25/2023  FINDINGS: Lower chest: Unremarkable  Hepatobiliary: Cholecystectomy. Dilated common hepatic duct at 1.9 cm in dilated common bile duct at 1.6 cm mild proximal intrahepatic biliary dilatation. Pneumobilia is observed along  with a 1.8 by 1.1 by 0.9 cm common hepatic duct stone on image 7 series 15 (previously on 08/25/2023 there were at least 7 similar sized stones in the common bile duct and common hepatic duct). Mildly dilated cystic duct remnant. Mildly blunted/truncated distal CBD for example on image 43 series 13 making it difficult to exclude distal choledocholithiasis.  Heterogeneous enhancement in the parenchyma in the arterial phase with some geographic reduced density in most of the left hepatic lobe on late arterial phase images. The degree of enhancement even is out on portal venous and delayed phase images. No findings of hepatic vein thrombosis or portal vein thrombosis  Pancreas:  Unremarkable  Spleen:  Unremarkable  Adrenals/Urinary Tract: Benign renal cysts warrant no further follow up. Adrenal glands unremarkable.  Stomach/Bowel: Unremarkable  Vascular/Lymphatic: Likely reactive lymph nodes in the right gastric and porta hepatis regions.  Other:  No supplemental non-categorized findings.  Musculoskeletal: Posterolateral rod and pedicle screw fixation extending from T10 through S1 and also with iliac bone screws, but with lack of pedicle screw attachment at the compressed L4 level.  IMPRESSION: 1. Choledocholithiasis with a single 1.8 by 1.1 by 0.9 cm common hepatic duct stone (previously on 08/25/2023 there were at least 7 similar sized stones in the common bile duct and common hepatic duct). Mildly blunted/truncated distal CBD making it difficult to exclude distal choledocholithiasis, although this could also be due to swelling at the ampulla. 2. Pneumobilia is observed compatible with prior sphincterotomy and persistent biliary dilatation along with mild proximal intrahepatic biliary dilatation. 3. Heterogeneous enhancement in the parenchyma in the arterial phase with some geographic reduced density in most of the left hepatic lobe on late arterial phase images. The degree  of enhancement even is out on portal venous and delayed phase images. Although this is relatively mild, the possibility of low-grade vasculitis or low-grade hepatic inflammation resulting in heterogeneous early enhancement is suggested. 4. Likely reactive lymph nodes in the right gastric and porta hepatis regions. 5. Posterolateral rod and pedicle screw fixation extending from T10 through S1 and also with iliac bone screws, but with lack of pedicle screw attachment at the compressed L4 level.  Electronically Signed   By: Freida Jes M.D.   On: 09/17/2023 09:03 CT Angio Chest/Abd/Pel for Dissection W and/or Wo Contrast CLINICAL DATA:  Central chest pain  that began yesterday.  EXAM: CT ANGIOGRAPHY CHEST, ABDOMEN AND PELVIS  TECHNIQUE: 08/25/2023  Multidetector CT imaging through the chest, abdomen and pelvis was performed using the standard protocol during bolus administration of intravenous contrast. Multiplanar reconstructed images and MIPs were obtained and reviewed to evaluate the vascular anatomy.  RADIATION DOSE REDUCTION: This exam was performed according to the departmental dose-optimization program which includes automated exposure control, adjustment of the mA and/or kV according to patient size and/or use of iterative reconstruction technique.  CONTRAST:  OMNIPAQUE  IOHEXOL  350 MG/ML SOLN  COMPARISON:  None Available.  FINDINGS: CTA CHEST FINDINGS  Cardiovascular: Preferential opacification of the thoracic aorta. No evidence of thoracic aortic aneurysm or dissection. Normal heart size. No pericardial effusion. No pulmonary artery filling defects seen. Mild atheromatous plaque to the aorta.  Mediastinum/Nodes: Negative for mass or adenopathy.  Lungs/Pleura: Central airways are clear. There is no edema, consolidation, effusion, or pneumothorax.  Musculoskeletal: No acute or aggressive finding.  Review of the MIP images confirms the above  findings.  CTA ABDOMEN AND PELVIS FINDINGS  VASCULAR  Aorta: Tortuous with atheromatous plaque diffusely. No dissection, inflammatory wall thickening, aneurysm, or stenosis.  Celiac: Widely patent and smoothly contoured branches  SMA: Widely patent with smoothly contoured branches  Renals: Unremarkable  IMA: Patent  Inflow: Generalized atheromatous plaque. No stenosis, ulceration, or aneurysm.  Veins: Unremarkable  Review of the MIP images confirms the above findings.  NON-VASCULAR  Hepatobiliary: No focal liver abnormality.Pneumobilia suggesting prior sphincterotomy. Choledocholithiasis by recent MRI. Cholecystectomy.  Pancreas: No acute inflammation or other interval change.  Spleen: Unremarkable.  Adrenals/Urinary Tract: Negative adrenals. No hydronephrosis or stone. Left renal cyst with simple appearance allowing for streak artifact, 3.6 cm. Unremarkable bladder.  Stomach/Bowel:  No obstruction. No visible bowel inflammation  Lymphatic: No mass or adenopathy.  Reproductive:No pathologic findings.  Other: No ascites or pneumoperitoneum.  Musculoskeletal: Lower thoracic to pelvic fusion without acute osseous finding  Review of the MIP images confirms the above findings.  IMPRESSION: Negative for acute aortic syndrome.  Aortic atherosclerosis and other chronic findings remain stable.  Electronically Signed   By: Ronnette Coke M.D.   On: 09/17/2023 05:12  Lab Results  Component Value Date   WBC 13.5 (H) 09/17/2023   HGB 12.7 (L) 09/17/2023   HCT 37.8 (L) 09/17/2023   MCV 90.0 09/17/2023   PLT 221 09/17/2023   Last metabolic panel Lab Results  Component Value Date   GLUCOSE 140 (H) 09/17/2023   NA 138 09/17/2023   K 3.0 (L) 09/17/2023   CL 106 09/17/2023   CO2 24 09/17/2023   BUN 14 09/17/2023   CREATININE 1.25 (H) 09/17/2023   GFRNONAA >60 09/17/2023   CALCIUM  8.9 09/17/2023   PHOS 3.0 08/25/2023   PROT 6.7 09/17/2023   ALBUMIN 3.5  09/17/2023   BILITOT 1.9 (H) 09/17/2023   ALKPHOS 118 09/17/2023   AST 64 (H) 09/17/2023   ALT 43 09/17/2023   ANIONGAP 8 09/17/2023    Assessment and Plan: Choledocholithiasis Recurrent mild to moderate central abdominal pain with noted Choledocholithiasis with a single 1.8 by 1.1 by 0.9 cm common hepatic duct stone.  Noted recent ERCP April 3 Prior April 2nd MRCP showing there were at least 7 similar sized stones in the common bile duct and common hepatic duct Noted prior cholecystectomy  T bili 1.9  LFTs grossly stable  Pending repeat ERCP with Dr. Ole Berkeley  Follow up GI recommendations    Dyslipidemia Cont statin  Hypertension BP stable  Titrate home regimen    Pulmonary embolism (HCC) On Eliquis   Monitor  Resume post procedure    GERD without esophagitis PPI  Tobacco use 1/4 PPD smoker  Discussed cessation  Nicotine  patch   Stage 3a chronic kidney disease (HCC) Cr 1.25 w/ GFR >60   Obstructive sleep apnea CPAP    Cocaine dependence in remission [facility]) UDS pending        Advance Care Planning:   Code Status: Full Code   Consults: GI   Family Communication: No family at the bedside  Severity of Illness: The appropriate patient status for this patient is OBSERVATION. Observation status is judged to be reasonable and necessary in order to provide the required intensity of service to ensure the patient's safety. The patient's presenting symptoms, physical exam findings, and initial radiographic and laboratory data in the context of their medical condition is felt to place them at decreased risk for further clinical deterioration. Furthermore, it is anticipated that the patient will be medically stable for discharge from the hospital within 2 midnights of admission.   Author: Corrinne Din, MD 09/17/2023 1:13 PM  For on call review [email or web address].

## 2023-09-17 NOTE — Anesthesia Procedure Notes (Signed)
 Procedure Name: Intubation Date/Time: 09/17/2023 12:26 PM  Performed by: Wilkins Hardy I, CRNAPre-anesthesia Checklist: Patient identified, Patient being monitored, Timeout performed, Emergency Drugs available and Suction available Patient Re-evaluated:Patient Re-evaluated prior to induction Oxygen Delivery Method: Circle system utilized Preoxygenation: Pre-oxygenation with 100% oxygen Induction Type: IV induction Ventilation: Mask ventilation without difficulty Laryngoscope Size: McGrath and 4 Grade View: Grade I Tube type: Oral Tube size: 7.5 mm Number of attempts: 1 Airway Equipment and Method: Stylet Placement Confirmation: ETT inserted through vocal cords under direct vision, positive ETCO2 and breath sounds checked- equal and bilateral Secured at: 23 cm Tube secured with: Tape Dental Injury: Teeth and Oropharynx as per pre-operative assessment

## 2023-09-17 NOTE — ED Provider Notes (Signed)
 [facility] Provider Note    Event Date/Time   First MD Initiated Contact with Patient 09/17/23 [phone]     (approximate)   History   Chest Pain   HPI  Earl Murray is a 68 y.o. male  cholecystectomy, HTN, HLD, prediabetes, GERD, depression, PE on  Eliquis , OSA, former smoker, cocaine abuse in remission, pancreatitis, kidney stone, bladder cancer, chronic low back pain, choledocholithiasis, who presents with abdominal pain and chest pain.   Patient reports he started having pain in his mid upper abdomen that radiates towards his lower chest and now seems to be throughout his mid abdomen since about 5 PM.  No nausea or vomiting.  He does take medication, he reports he takes his Eliquis  most the time but sometimes forgets.  Triage reports the patient had not taken his Eliquis  for 2 weeks, but the patient reports that he he takes that he just sometimes forgets.  Reports he took 1 tab yesterday, he knows he supposed to take 2 but often forgets to take it twice a day  He reports that is not really chest pain is much as it is discomfort like in his upper abdomen and it will radiate towards his lower chest points towards his xiphoid region.  It does not go up into the upper chest arms neck or back.  There has been no diarrhea no nausea or vomiting with it.  He reports he has had a remote history of problems with his pancreas maybe 10 years ago, he had a very small amount of alcohol tonight      Physical Exam   Triage Vital Signs: ED Triage Vitals  Encounter Vitals Group     BP 09/17/23 0321 (!) 150/85     Systolic BP Percentile --      Diastolic BP Percentile --      Pulse Rate 09/17/23 0321 85     Resp 09/17/23 0321 16     Temp 09/17/23 0321 98.2 F (36.8 C)     Temp Source 09/17/23 0321 Oral     SpO2 09/17/23 0321 100 %     Weight 09/17/23 0322 200 lb (90.7 kg)     Height 09/17/23 0322 6\' 5"  (1.956 m)     Head Circumference --      Peak Flow --      Pain  Score 09/17/23 0321 10     Pain Loc --      Pain Education --      Exclude from Growth Chart --     Most recent vital signs: Vitals:   09/17/23 0500 09/17/23 0530  BP: 137/80 138/84  Pulse: (!) 59 (!) 51  Resp: (!) 23 (!) 21  Temp:    SpO2: 100% 100%     General: Awake, no distress.  Pleasant. CV:  Good peripheral perfusion.  Normal tones and rate Resp:  Normal effort.  Clear bilateral.  Reports a slight pleuritic component when he takes a deep breath it aggravates the pain pointing toward his epigastrium Abd:  No distention.  Moderate tenderness throughout the epigastrium and also some in the left lower quadrant without rebound or guarding.  Patient reports moderate tenderness most focally in the epigastrium and left lower quadrant.  Negative for right upper quadrant pain [patient reports he had his gallbladder removed as he was having issues with gallstones in the past but this feels different ] Other:     ED Results / Procedures / Treatments   Labs (  all labs ordered are listed, but only abnormal results are displayed) Labs Reviewed  BASIC METABOLIC PANEL WITH GFR - Abnormal; Notable for the following components:      Result Value   Potassium 3.0 (*)    Glucose, Bld 140 (*)    Creatinine, Ser 1.25 (*)    All other components within normal limits  CBC - Abnormal; Notable for the following components:   WBC 13.5 (*)    RBC 4.20 (*)    Hemoglobin 12.7 (*)    HCT 37.8 (*)    RDW 15.9 (*)    All other components within normal limits  HEPATIC FUNCTION PANEL - Abnormal; Notable for the following components:   AST 64 (*)    Total Bilirubin 1.9 (*)    Bilirubin, Direct 0.8 (*)    Indirect Bilirubin 1.1 (*)    All other components within normal limits  LIPASE, BLOOD  TROPONIN I (HIGH SENSITIVITY)  TROPONIN I (HIGH SENSITIVITY)     EKG  And interpreted by me at 3:30 AM heart rate 85 QRS 80 QTc 440 Normal sinus rhythm occasional PVC.  No evidence of  ischemia   RADIOLOGY  CT Angio Chest/Abd/Pel for Dissection W and/or Wo Contrast Result Date: 09/17/2023 CLINICAL DATA:  Central chest pain that began yesterday. EXAM: CT ANGIOGRAPHY CHEST, ABDOMEN AND PELVIS TECHNIQUE: 08/25/2023 Multidetector CT imaging through the chest, abdomen and pelvis was performed using the standard protocol during bolus administration of intravenous contrast. Multiplanar reconstructed images and MIPs were obtained and reviewed to evaluate the vascular anatomy. RADIATION DOSE REDUCTION: This exam was performed according to the departmental dose-optimization program which includes automated exposure control, adjustment of the mA and/or kV according to patient size and/or use of iterative reconstruction technique. CONTRAST:  OMNIPAQUE  IOHEXOL  350 MG/ML SOLN COMPARISON:  None Available. FINDINGS: CTA CHEST FINDINGS Cardiovascular: Preferential opacification of the thoracic aorta. No evidence of thoracic aortic aneurysm or dissection. Normal heart size. No pericardial effusion. No pulmonary artery filling defects seen. Mild atheromatous plaque to the aorta. Mediastinum/Nodes: Negative for mass or adenopathy. Lungs/Pleura: Central airways are clear. There is no edema, consolidation, effusion, or pneumothorax. Musculoskeletal: No acute or aggressive finding. Review of the MIP images confirms the above findings. CTA ABDOMEN AND PELVIS FINDINGS VASCULAR Aorta: Tortuous with atheromatous plaque diffusely. No dissection, inflammatory wall thickening, aneurysm, or stenosis. Celiac: Widely patent and smoothly contoured branches SMA: Widely patent with smoothly contoured branches Renals: Unremarkable IMA: Patent Inflow: Generalized atheromatous plaque. No stenosis, ulceration, or aneurysm. Veins: Unremarkable Review of the MIP images confirms the above findings. NON-VASCULAR Hepatobiliary: No focal liver abnormality.Pneumobilia suggesting prior sphincterotomy. Choledocholithiasis by recent  MRI. Cholecystectomy. Pancreas: No acute inflammation or other interval change. Spleen: Unremarkable. Adrenals/Urinary Tract: Negative adrenals. No hydronephrosis or stone. Left renal cyst with simple appearance allowing for streak artifact, 3.6 cm. Unremarkable bladder. Stomach/Bowel:  No obstruction. No visible bowel inflammation Lymphatic: No mass or adenopathy. Reproductive:No pathologic findings. Other: No ascites or pneumoperitoneum. Musculoskeletal: Lower thoracic to pelvic fusion without acute osseous finding Review of the MIP images confirms the above findings. IMPRESSION: Negative for acute aortic syndrome. Aortic atherosclerosis and other chronic findings remain stable. Electronically Signed   By: Ronnette Coke M.D.   On: 09/17/2023 05:12      PROCEDURES:  Critical Care performed: No  Procedures   MEDICATIONS ORDERED IN ED: Medications  gadobutrol  (GADAVIST ) 1 MMOL/ML injection 9 mL (has no administration in time range)  iohexol  (OMNIPAQUE ) 350 MG/ML injection 100 mL (100  mLs Intravenous Contrast Given 09/17/23 0423)  HYDROmorphone  (DILAUDID ) injection 0.5 mg (0.5 mg Intravenous Given 09/17/23 0443)     IMPRESSION / MDM / ASSESSMENT AND PLAN / ED COURSE  I reviewed the triage vital signs and the nursing notes.                              Differential diagnosis includes, but is not limited to, possible causes are entertained such as ACS though seemingly atypical in symptom, PE given his intermittent did not always extreme adherent use of Eliquis  with a history of PE, intra-abdominal pain especially epigastric pain, considerations such as diverticular perforation, aneurysmal, dissection, hepatobiliary renal etc. are considered.  Somewhat nondescript location mostly epigastric but radiates some pain into the chest as well with associated left lower quadrant pain.  Awaiting LFTs and lipase.  Will obtain imaging in this case I think it is prudent to exclude both intrathoracic and  abdominal symptoms.  I have ordered CT angiography  Patient's presentation is most consistent with acute complicated illness / injury requiring diagnostic workup.   Patient had recent exclusion of thromboembolism on CT PE.  His CT angiography is reassuring today.  Discussed with our on-call GI physician given the patient's recent past medical history ERCP and choledocholithiasis, in the setting of his slightly uptrending bilirubin but overall up improved AST ALT and normal ALP, recommendation is to obtain MRCP.  MRCP has been ordered.  Patient previously had MRCP performed on April 2.  Ongoing care assigned to Dr. Vicenta Graft, who also reports familiarity with the patient from previous, with plan to follow-up on reassessment of pain symptomatology and results of MRCP to be discussed with Dr. Mamie Searles who is recommended this study today.        FINAL CLINICAL IMPRESSION(S) / ED DIAGNOSES   Final diagnoses:  Epigastric abdominal pain  Atypical chest pain     Rx / DC Orders   ED Discharge Orders     None        Note:  This document was prepared using Dragon voice recognition software and may include unintentional dictation errors.   Iver Marker, MD 09/17/23 [phone]

## 2023-09-17 NOTE — Anesthesia Postprocedure Evaluation (Signed)
 Anesthesia Post Note  Patient: Earl Murray  Procedure(s) Performed: ERCP, WITH INTERVENTION IF INDICATED  Patient location during evaluation: PACU Anesthesia Type: General Level of consciousness: awake and alert, oriented and patient cooperative Pain management: pain level controlled Vital Signs Assessment: post-procedure vital signs reviewed and stable Respiratory status: spontaneous breathing, nonlabored ventilation and respiratory function stable Cardiovascular status: blood pressure returned to baseline and stable Postop Assessment: adequate PO intake Anesthetic complications: no   No notable events documented.   Last Vitals:  Vitals:   09/17/23 1341 09/17/23 1351  BP:  133/89  Pulse: 66 (!) 58  Resp: 10 10  Temp:    SpO2: 96% 98%    Last Pain:  Vitals:   09/17/23 1341  TempSrc:   PainSc: 0-No pain                 Dorothey Gate

## 2023-09-17 NOTE — Consult Note (Signed)
 Consultation  Referring Provider: ED     Admit date: 09/17/2023 Consult date: 09/17/2023         Reason for Consultation: Abnormal liver enzymes              HPI:   Earl Murray is a 68 y.o. gentleman with history of CKD, Hepatitis C, OSA, and recent episode of choledocholithiasis who presented with recurrent symptoms. An MRCP showed residual choledocholithiasis and patient is already s/p ERCP with removal. Last took DOAC a week ago.   Past Medical History:  Diagnosis Date   Bladder cancer (HCC)    Choledocholithiasis    Chronic kidney disease    Cocaine dependence in remission (HCC)    Concussion    Depression    DVT (deep venous thrombosis) (HCC)    GERD (gastroesophageal reflux disease)    Hepatitis C    History of cancer    Hyperlipidemia    Hypertension    Migraine    Neck injury    Opioid dependence in remission (HCC)    Pancreatitis    Prediabetes    Scoliosis    Sleep apnea     Past Surgical History:  Procedure Laterality Date   BACK SURGERY     CHOLECYSTECTOMY     ELBOW SURGERY     ERCP N/A 10/27/2019   Procedure: ENDOSCOPIC RETROGRADE CHOLANGIOPANCREATOGRAPHY (ERCP);  Surgeon: Marnee Sink, MD;  Location: [facility] ENDOSCOPY;  Service: Endoscopy;  Laterality: N/A;   ERCP N/A 08/26/2023   Procedure: ERCP, WITH INTERVENTION IF INDICATED;  Surgeon: Marnee Sink, MD;  Location: [facility] ENDOSCOPY;  Service: Endoscopy;  Laterality: N/A;   FOOT SURGERY     HERNIA REPAIR     4 hernia repairs   PULMONARY THROMBECTOMY N/A 10/14/2021   Procedure: PULMONARY THROMBECTOMY;  Surgeon: Jackquelyn Mass, MD;  Location: [facility] INVASIVE CV LAB;  Service: Cardiovascular;  Laterality: N/A;    Family History  Problem Relation Age of Onset   Hypertension Mother    Heart disease Mother    Colon cancer Father      Social History   Tobacco Use   Smoking status: Some Days    Current packs/day: 0.00    Types: Cigarettes    Last attempt to quit: 05/2018    Years since quitting: 5.3    Smokeless tobacco: Never  Vaping Use   Vaping status: Never Used  Substance Use Topics   Alcohol use: Yes    Comment: occ   Drug use: Not Currently    Prior to Admission medications   Medication Sig Start Date End Date Taking? Authorizing Provider  amLODipine  (NORVASC ) 10 MG tablet Take 10 mg by mouth daily.   Yes [provider]  apixaban  (ELIQUIS ) 5 MG TABS tablet Take 5 mg by mouth 2 (two) times daily. 12/08/22  Yes [provider]  atorvastatin  (LIPITOR) 40 MG tablet Take 20 mg by mouth every evening. 08/23/23  Yes [provider]  chlorthalidone (HYGROTON) 25 MG tablet Take 25 mg by mouth daily. 08/23/23  Yes [provider]  DULoxetine (CYMBALTA) 30 MG capsule Take 30 mg by mouth daily.   Yes [provider]  HYDROPHILIC EX Apply 1 Application topically daily as needed (dry skin). 06/08/23  Yes [provider]  hydrOXYzine (ATARAX) 25 MG tablet Take 25 mg by mouth 2 (two) times daily as needed for anxiety. 08/23/23  Yes [provider]  losartan  (COZAAR ) 100 MG tablet Take 100 mg by mouth daily. 08/19/23 11/17/23 Yes  [provider]  losartan  (COZAAR ) 25 MG tablet Take 1 tablet by mouth daily. 08/23/23  Yes [provider]  MENTHOL-METHYL SALICYLATE EX Apply 1 Application topically 4 (four) times daily as needed (pain relief). 06/08/23  Yes [provider]  naloxone (NARCAN) nasal spray 4 mg/0.1 mL Place 1 spray into the nose once as needed (opiod overdose).   Yes [provider]  OXYCONTIN  10 MG 12 hr tablet Take 10 mg by mouth 2 (two) times daily. 09/01/23  Yes [provider]  PARoxetine  (PAXIL ) 40 MG tablet Take 20 mg by mouth every morning. 08/23/23  Yes [provider]  tapentadol (NUCYNTA) 100 MG 12 hr tablet Take 100 mg by mouth 2 (two) times daily. 08/30/23  Yes [provider]  tiZANidine  (ZANAFLEX ) 4 MG tablet Take 2 tablets (8 mg total) by mouth daily as needed  for muscle spasms. 08/20/23  Yes Verla Glaze, MD  gabapentin  (NEURONTIN ) 300 MG capsule gabapentin  300 mg capsule  one tab bid-tid  03/01/19  [provider]  ipratropium (ATROVENT ) 0.06 % nasal spray Place 2 sprays into both nostrils 4 (four) times daily as needed for rhinitis. 10/22/19 03/16/20  Gray, Bryan E, NP  levocetirizine (XYZAL ) 5 MG tablet Take 1 tablet (5 mg total) by mouth every evening. 09/30/19 03/16/20  Cook, Jayce G, DO    Current Facility-Administered Medications  Medication Dose Route Frequency Provider Last Rate Last Admin   0.9 %  sodium chloride  infusion   Intravenous Continuous Marnee Sink, MD   Stopped at 09/17/23 1521   acetaminophen  (TYLENOL ) tablet 650 mg  650 mg Oral Q6H PRN Marnee Sink, MD       amLODipine  (NORVASC ) tablet 10 mg  10 mg Oral Daily Wohl, Darren, MD   10 mg at 09/17/23 1521   apixaban  (ELIQUIS ) tablet 5 mg  5 mg Oral BID Marnee Sink, MD   5 mg at 09/17/23 1521   atorvastatin  (LIPITOR) tablet 20 mg  20 mg Oral QPM Marnee Sink, MD       HYDROmorphone  (DILAUDID ) injection 0.5 mg  0.5 mg Intravenous Q4H PRN Newton, Steven J, MD   0.5 mg at 09/17/23 1525   [START ON 09/18/2023] nicotine  (NICODERM CQ  - dosed in mg/24 hr) patch 7 mg  7 mg Transdermal Daily Marnee Sink, MD       ondansetron  (ZOFRAN ) tablet 4 mg  4 mg Oral Q6H PRN Marnee Sink, MD       Or   ondansetron  (ZOFRAN ) injection 4 mg  4 mg Intravenous Q6H PRN Marnee Sink, MD       Oral care mouth rinse  15 mL Mouth Rinse PRN Corrinne Din, MD       [START ON 09/18/2023] PARoxetine  (PAXIL ) tablet 20 mg  20 mg Oral Clemon Cypress, Darren, MD       potassium chloride  10 mEq in 100 mL IVPB  10 mEq Intravenous Q1 Hr x 2 Ramonita Burow, RPH 100 mL/hr at 09/17/23 1539 Infusion Verify at 09/17/23 1539   tiZANidine  (ZANAFLEX ) tablet 8 mg  8 mg Oral Daily PRN Marnee Sink, MD        Allergies as of 09/17/2023 - Review Complete 09/17/2023  Allergen Reaction Noted   Gabapentin  Other (See  Comments) 12/09/2022   Toradol  [ketorolac  tromethamine ] Itching 08/19/2023   Motrin [ibuprofen] Itching and Other (See Comments) 10/23/2017   Tramadol Rash 10/23/2017   Tylenol  with codeine #3 [acetaminophen -codeine] Itching 03/01/2019     Review of Systems:  All systems reviewed and negative except where noted in HPI.  Review of Systems  Constitutional:  Negative for chills and fever.  Respiratory:  Positive for shortness of breath.   Cardiovascular:  Positive for chest pain.  Gastrointestinal:  Positive for abdominal pain.  Musculoskeletal:  Positive for joint pain.  Skin:  Negative for rash.  Neurological:  Negative for focal weakness.  All other systems reviewed and are negative.      Physical Exam:  Vital signs in last 24 hours: Temp:  [97 F (36.1 C)-98.2 F (36.8 C)] 97.8 F (36.6 C) (04/25 1504) Pulse Rate:  [51-87] 70 (04/25 1541) Resp:  [10-23] 17 (04/25 1504) BP: (108-179)/(52-97) 179/97 (04/25 1541) SpO2:  [93 %-100 %] 99 % (04/25 1541) Weight:  [90.7 kg] 90.7 kg (04/25 0322) Last BM Date : 09/16/23 General:   Pleasant in NAD Head:  Normocephalic and atraumatic. Eyes:   No icterus.   Conjunctiva pink. Ears:  Normal auditory acuity. Mouth: Mucosa pink moist, no lesions. Neck:  Supple; no masses felt Lungs: No respiratory distress Abdomen:   Flat, soft, nondistended, nontender Rectal:  Not performed.  Msk: Symmetrical without gross deformities. Neurologic:  Alert and  oriented x4;  No focal deficits Skin:  Warm, dry, pink without significant lesions or rashes. Psych:  Alert and cooperative. Normal affect.  LAB RESULTS: Recent Labs    09/17/23 0328  WBC 13.5*  HGB 12.7*  HCT 37.8*  PLT 221   BMET Recent Labs    09/17/23 0328  NA 138  K 3.0*  CL 106  CO2 24  GLUCOSE 140*  BUN 14  CREATININE 1.25*  CALCIUM  8.9   LFT Recent Labs    09/17/23 0329  PROT 6.7  ALBUMIN 3.5  AST 64*  ALT 43  ALKPHOS 118  BILITOT 1.9*  BILIDIR 0.8*   IBILI 1.1*   PT/INR No results for input(s): "LABPROT", "INR" in the last 72 hours.  STUDIES: DG C-Arm 1-60 Min-No Report Result Date: 09/17/2023 Fluoroscopy was utilized by the requesting physician.  No radiographic interpretation.   MR ABDOMEN MRCP W WO CONTAST Result Date: 09/17/2023 CLINICAL DATA:  Epigastric pain, recent choledocholithiasis. EXAM: MRI ABDOMEN WITHOUT AND WITH CONTRAST (INCLUDING MRCP) TECHNIQUE: Multiplanar multisequence MR imaging of the abdomen was performed both before and after the administration of intravenous contrast. Heavily T2-weighted images of the biliary and pancreatic ducts were obtained, and three-dimensional MRCP images were rendered by post processing. Post-processing was applied at the acquisition scanner with concurrent physician supervision which includes 3D reconstructions, MIPs, volume rendered images and/or shaded surface rendering. CONTRAST:  9mL GADAVIST  GADOBUTROL  1 MMOL/ML IV SOLN COMPARISON:  CT angiogram 09/17/2023 MRCP from 08/25/2023 FINDINGS: Lower chest: Unremarkable Hepatobiliary: Cholecystectomy. Dilated common hepatic duct at 1.9 cm in dilated common bile duct at 1.6 cm mild proximal intrahepatic biliary dilatation. Pneumobilia is observed along with a 1.8 by 1.1 by 0.9 cm common hepatic duct stone on image 7 series 15 (previously on 08/25/2023 there were at least 7 similar sized stones in the common bile duct and common hepatic duct). Mildly dilated cystic duct remnant. Mildly blunted/truncated distal CBD for example on image 43 series 13 making it difficult to exclude distal choledocholithiasis. Heterogeneous enhancement in the parenchyma in the arterial phase with some geographic reduced density in most of the left hepatic lobe on late arterial phase images. The degree of enhancement even is out on portal venous and delayed phase images. No findings of hepatic vein thrombosis or portal vein thrombosis  Pancreas:  Unremarkable Spleen:   Unremarkable Adrenals/Urinary Tract: Benign renal cysts warrant no further follow up. Adrenal glands unremarkable. Stomach/Bowel: Unremarkable Vascular/Lymphatic: Likely reactive lymph nodes in the right gastric and porta hepatis regions. Other:  No supplemental non-categorized findings. Musculoskeletal: Posterolateral rod and pedicle screw fixation extending from T10 through S1 and also with iliac bone screws, but with lack of pedicle screw attachment at the compressed L4 level. IMPRESSION: 1. Choledocholithiasis with a single 1.8 by 1.1 by 0.9 cm common hepatic duct stone (previously on 08/25/2023 there were at least 7 similar sized stones in the common bile duct and common hepatic duct). Mildly blunted/truncated distal CBD making it difficult to exclude distal choledocholithiasis, although this could also be due to swelling at the ampulla. 2. Pneumobilia is observed compatible with prior sphincterotomy and persistent biliary dilatation along with mild proximal intrahepatic biliary dilatation. 3. Heterogeneous enhancement in the parenchyma in the arterial phase with some geographic reduced density in most of the left hepatic lobe on late arterial phase images. The degree of enhancement even is out on portal venous and delayed phase images. Although this is relatively mild, the possibility of low-grade vasculitis or low-grade hepatic inflammation resulting in heterogeneous early enhancement is suggested. 4. Likely reactive lymph nodes in the right gastric and porta hepatis regions. 5. Posterolateral rod and pedicle screw fixation extending from T10 through S1 and also with iliac bone screws, but with lack of pedicle screw attachment at the compressed L4 level. Electronically Signed   By: Freida Jes M.D.   On: 09/17/2023 09:03   MR 3D Recon At Scanner Result Date: 09/17/2023 CLINICAL DATA:  Epigastric pain, recent choledocholithiasis. EXAM: MRI ABDOMEN WITHOUT AND WITH CONTRAST (INCLUDING MRCP) TECHNIQUE:  Multiplanar multisequence MR imaging of the abdomen was performed both before and after the administration of intravenous contrast. Heavily T2-weighted images of the biliary and pancreatic ducts were obtained, and three-dimensional MRCP images were rendered by post processing. Post-processing was applied at the acquisition scanner with concurrent physician supervision which includes 3D reconstructions, MIPs, volume rendered images and/or shaded surface rendering. CONTRAST:  9mL GADAVIST  GADOBUTROL  1 MMOL/ML IV SOLN COMPARISON:  CT angiogram 09/17/2023 MRCP from 08/25/2023 FINDINGS: Lower chest: Unremarkable Hepatobiliary: Cholecystectomy. Dilated common hepatic duct at 1.9 cm in dilated common bile duct at 1.6 cm mild proximal intrahepatic biliary dilatation. Pneumobilia is observed along with a 1.8 by 1.1 by 0.9 cm common hepatic duct stone on image 7 series 15 (previously on 08/25/2023 there were at least 7 similar sized stones in the common bile duct and common hepatic duct). Mildly dilated cystic duct remnant. Mildly blunted/truncated distal CBD for example on image 43 series 13 making it difficult to exclude distal choledocholithiasis. Heterogeneous enhancement in the parenchyma in the arterial phase with some geographic reduced density in most of the left hepatic lobe on late arterial phase images. The degree of enhancement even is out on portal venous and delayed phase images. No findings of hepatic vein thrombosis or portal vein thrombosis Pancreas:  Unremarkable Spleen:  Unremarkable Adrenals/Urinary Tract: Benign renal cysts warrant no further follow up. Adrenal glands unremarkable. Stomach/Bowel: Unremarkable Vascular/Lymphatic: Likely reactive lymph nodes in the right gastric and porta hepatis regions. Other:  No supplemental non-categorized findings. Musculoskeletal: Posterolateral rod and pedicle screw fixation extending from T10 through S1 and also with iliac bone screws, but with lack of pedicle  screw attachment at the compressed L4 level. IMPRESSION: 1. Choledocholithiasis with a single 1.8 by 1.1 by 0.9 cm common hepatic duct stone (  previously on 08/25/2023 there were at least 7 similar sized stones in the common bile duct and common hepatic duct). Mildly blunted/truncated distal CBD making it difficult to exclude distal choledocholithiasis, although this could also be due to swelling at the ampulla. 2. Pneumobilia is observed compatible with prior sphincterotomy and persistent biliary dilatation along with mild proximal intrahepatic biliary dilatation. 3. Heterogeneous enhancement in the parenchyma in the arterial phase with some geographic reduced density in most of the left hepatic lobe on late arterial phase images. The degree of enhancement even is out on portal venous and delayed phase images. Although this is relatively mild, the possibility of low-grade vasculitis or low-grade hepatic inflammation resulting in heterogeneous early enhancement is suggested. 4. Likely reactive lymph nodes in the right gastric and porta hepatis regions. 5. Posterolateral rod and pedicle screw fixation extending from T10 through S1 and also with iliac bone screws, but with lack of pedicle screw attachment at the compressed L4 level. Electronically Signed   By: Freida Jes M.D.   On: 09/17/2023 09:03   CT Angio Chest/Abd/Pel for Dissection W and/or Wo Contrast Result Date: 09/17/2023 CLINICAL DATA:  Central chest pain that began yesterday. EXAM: CT ANGIOGRAPHY CHEST, ABDOMEN AND PELVIS TECHNIQUE: 08/25/2023 Multidetector CT imaging through the chest, abdomen and pelvis was performed using the standard protocol during bolus administration of intravenous contrast. Multiplanar reconstructed images and MIPs were obtained and reviewed to evaluate the vascular anatomy. RADIATION DOSE REDUCTION: This exam was performed according to the departmental dose-optimization program which includes automated exposure control,  adjustment of the mA and/or kV according to patient size and/or use of iterative reconstruction technique. CONTRAST:  OMNIPAQUE  IOHEXOL  350 MG/ML SOLN COMPARISON:  None Available. FINDINGS: CTA CHEST FINDINGS Cardiovascular: Preferential opacification of the thoracic aorta. No evidence of thoracic aortic aneurysm or dissection. Normal heart size. No pericardial effusion. No pulmonary artery filling defects seen. Mild atheromatous plaque to the aorta. Mediastinum/Nodes: Negative for mass or adenopathy. Lungs/Pleura: Central airways are clear. There is no edema, consolidation, effusion, or pneumothorax. Musculoskeletal: No acute or aggressive finding. Review of the MIP images confirms the above findings. CTA ABDOMEN AND PELVIS FINDINGS VASCULAR Aorta: Tortuous with atheromatous plaque diffusely. No dissection, inflammatory wall thickening, aneurysm, or stenosis. Celiac: Widely patent and smoothly contoured branches SMA: Widely patent with smoothly contoured branches Renals: Unremarkable IMA: Patent Inflow: Generalized atheromatous plaque. No stenosis, ulceration, or aneurysm. Veins: Unremarkable Review of the MIP images confirms the above findings. NON-VASCULAR Hepatobiliary: No focal liver abnormality.Pneumobilia suggesting prior sphincterotomy. Choledocholithiasis by recent MRI. Cholecystectomy. Pancreas: No acute inflammation or other interval change. Spleen: Unremarkable. Adrenals/Urinary Tract: Negative adrenals. No hydronephrosis or stone. Left renal cyst with simple appearance allowing for streak artifact, 3.6 cm. Unremarkable bladder. Stomach/Bowel:  No obstruction. No visible bowel inflammation Lymphatic: No mass or adenopathy. Reproductive:No pathologic findings. Other: No ascites or pneumoperitoneum. Musculoskeletal: Lower thoracic to pelvic fusion without acute osseous finding Review of the MIP images confirms the above findings. IMPRESSION: Negative for acute aortic syndrome. Aortic atherosclerosis  and other chronic findings remain stable. Electronically Signed   By: Ronnette Coke M.D.   On: 09/17/2023 05:12       Impression / Plan:   68 y.o. gentleman with history of CKD, Hepatitis C, OSA, and recent episode of choledocholithiasis who presented with residual choledocholithiasis s/p ERCP  - monitor for any signs/symptoms of pancreatitis - daily cmp - will check on patient tomorrow  Please call with any questions or concerns.  Olivia Bevel MD,  [facility]

## 2023-09-17 NOTE — Assessment & Plan Note (Signed)
1/4 PPD smoker  Discussed cessation  Nicotine patch

## 2023-09-17 NOTE — Anesthesia Preprocedure Evaluation (Addendum)
 Anesthesia Evaluation  Patient identified by MRN, date of birth, ID band Patient awake    Reviewed: Allergy & Precautions, NPO status , Patient's Chart, lab work & pertinent test results  History of Anesthesia Complications Negative for: history of anesthetic complications  Airway Mallampati: I   Neck ROM: Full    Dental  (+) Missing   Pulmonary sleep apnea , Current Smoker, former smoker (quit 2023)   Pulmonary exam normal breath sounds clear to auscultation       Cardiovascular hypertension, Normal cardiovascular exam Rhythm:Regular Rate:Normal  Hx PE s/p thrombectomy on Eliquis   ECG 09/17/23:  Sinus rhythm Ventricular premature complex  Myocardial perfusion 08/20/23:    The study is normal. The study is low risk.   No ST deviation was noted.   LV perfusion is normal. There is no evidence of ischemia. There is no evidence of infarction.   Left ventricular function is normal. Nuclear stress EF: 64%. End diastolic cavity size is normal. End systolic cavity size is normal.   CT attenuation images showed minimal aortic calcifications.    Neuro/Psych  Headaches PSYCHIATRIC DISORDERS  Depression    Chronic lower back pain; remote hx opioid and cocaine use disorder, none in 30 years    GI/Hepatic ,GERD  ,,(+) Hepatitis -, C  Endo/Other  Prediabetes   Renal/GU Renal disease (stage III CKD)   Bladder CA    Musculoskeletal   Abdominal   Peds  Hematology negative hematology ROS (+)   Anesthesia Other Findings   Reproductive/Obstetrics                             Anesthesia Physical Anesthesia Plan  ASA: 3  Anesthesia Plan: General   Post-op Pain Management:    Induction: Intravenous  PONV Risk Score and Plan: 1 and Treatment may vary due to age or medical condition and Ondansetron   Airway Management Planned: Oral ETT  Additional Equipment:   Intra-op Plan:    Post-operative Plan: Extubation in OR  Informed Consent: I have reviewed the patients History and Physical, chart, labs and discussed the procedure including the risks, benefits and alternatives for the proposed anesthesia with the patient or authorized representative who has indicated his/her understanding and acceptance.     Dental advisory given  Plan Discussed with: CRNA  Anesthesia Plan Comments: (Patient consented for risks of anesthesia including but not limited to:  - adverse reactions to medications - damage to eyes, teeth, lips or other oral mucosa - nerve damage due to positioning  - sore throat or hoarseness - damage to heart, brain, nerves, lungs, other parts of body or loss of life  Informed patient about role of CRNA in peri- and intra-operative care.  Patient voiced understanding.)        Anesthesia Quick Evaluation

## 2023-09-17 NOTE — Transfer of Care (Signed)
 Immediate Anesthesia Transfer of Care Note  Patient: Earl Murray  Procedure(s) Performed: ERCP, WITH INTERVENTION IF INDICATED  Patient Location: PACU  Anesthesia Type:General  Level of Consciousness: awake and alert   Airway & Oxygen Therapy: Patient Spontanous Breathing  Post-op Assessment: Report given to RN and Post -op Vital signs reviewed and stable  Post vital signs: stable  Last Vitals:  Vitals Value Taken Time  BP 151/97 09/17/23 1321  Temp    Pulse 81 09/17/23 1322  Resp 18 09/17/23 1322  SpO2 97 % 09/17/23 1322  Vitals shown include unfiled device data.  Last Pain:  Vitals:   09/17/23 1100  TempSrc:   PainSc: 8          Complications: No notable events documented.

## 2023-09-17 NOTE — ED Notes (Signed)
 Pt transported to endoscopy

## 2023-09-17 NOTE — OR Nursing (Signed)
 Pt c/o initial mild soreness of throat post recovery that later turned into c/o abdominal pain scoring 8, repositioning and room temp beverage provided with no relief; notified Dr Glyn Laser, orders placed for Fentanyl  50mg  IVP; med administered as rx 1424; @1441  pt stated pain was no better and no worse since initial onset..the patient taken to room assignment w/o issues, hand off to [place], [place]

## 2023-09-17 NOTE — Consult Note (Signed)
 PHARMACY CONSULT NOTE - ELECTROLYTES  Pharmacy Consult for Electrolyte Monitoring and Replacement   Recent Labs: Height: 6\' 5"  (195.6 cm) Weight: 90.7 kg (200 lb) IBW/kg (Calculated) : 89.1 Estimated Creatinine Clearance: 70.3 mL/min (A) (by C-G formula based on SCr of 1.25 mg/dL (H)). Potassium (mmol/L)  Date Value  09/17/2023 3.0 (L)   Magnesium  (mg/dL)  Date Value  16/02/9603 2.0   Calcium  (mg/dL)  Date Value  54/01/8118 8.9   Albumin (g/dL)  Date Value  14/78/2956 3.5   Phosphorus (mg/dL)  Date Value  21/30/8657 3.0   Sodium (mmol/L)  Date Value  09/17/2023 138    Assessment  Earl Murray is a 68 y.o. male presenting with abdominal pain with choledocholithiasis. PMH significant for HTN, HLD, GERD, PE on Eliquis . Pharmacy has been consulted to monitor and replace electrolytes.  Diet: Regular diet MIVF: NS @ 100 mL/hr Pertinent medications: N/A  Goal of Therapy: Electrolytes WNL  Plan:  K = 3.0, Give Kcl 40 mEq po x 1 and Kcl 10 mEq IV x 2 Check BMP, Mg, Phos with AM labs  Thank you for allowing pharmacy to be a part of this patient's care.  Ramonita Burow, PharmD Clinical Pharmacist 09/17/2023 1:22 PM

## 2023-09-17 NOTE — Assessment & Plan Note (Signed)
UDS pending  

## 2023-09-17 NOTE — ED Provider Notes (Signed)
 Procedures     ----------------------------------------- 10:12 AM on 09/17/2023 ----------------------------------------- MRCP confirms 1.8 cm cholelithiasis in common hepatic duct.  Discussed with GI Dr. Emerick Hanlon, Dr. Ole Berkeley will come evaluate patient.    ----------------------------------------- 11:01 AM on 09/17/2023 ----------------------------------------- Case discussed with hospitalist.  Plan for ERCP this afternoon   Jacquie Maudlin, MD 09/17/23 1101

## 2023-09-17 NOTE — ED Notes (Signed)
 Patient transported to MRI

## 2023-09-17 NOTE — Assessment & Plan Note (Addendum)
 Recurrent mild to moderate central abdominal pain with noted Choledocholithiasis with a single 1.8 by 1.1 by 0.9 cm common hepatic duct stone.  Noted recent ERCP April 3 Prior April 2nd MRCP showing there were at least 7 similar sized stones in the common bile duct and common hepatic duct Noted prior cholecystectomy  T bili 1.9  LFTs grossly stable  Pending repeat ERCP with Dr. Ole Berkeley  Follow up GI recommendations

## 2023-09-17 NOTE — Assessment & Plan Note (Signed)
 CPAP.

## 2023-09-17 NOTE — Assessment & Plan Note (Addendum)
 On Eliquis   Monitor  Resume post procedure

## 2023-09-17 NOTE — Assessment & Plan Note (Signed)
 BP stable Titrate home regimen

## 2023-09-18 DIAGNOSIS — R0789 Other chest pain: Secondary | ICD-10-CM | POA: Diagnosis not present

## 2023-09-18 DIAGNOSIS — K219 Gastro-esophageal reflux disease without esophagitis: Secondary | ICD-10-CM | POA: Diagnosis not present

## 2023-09-18 DIAGNOSIS — R1013 Epigastric pain: Secondary | ICD-10-CM | POA: Diagnosis not present

## 2023-09-18 DIAGNOSIS — N1831 Chronic kidney disease, stage 3a: Secondary | ICD-10-CM

## 2023-09-18 DIAGNOSIS — K8050 Calculus of bile duct without cholangitis or cholecystitis without obstruction: Secondary | ICD-10-CM | POA: Diagnosis not present

## 2023-09-18 LAB — CBC
HCT: 36.7 % — ABNORMAL LOW (ref 39.0–52.0)
Hemoglobin: 12.4 g/dL — ABNORMAL LOW (ref 13.0–17.0)
MCH: 30.5 pg (ref 26.0–34.0)
MCHC: 33.8 g/dL (ref 30.0–36.0)
MCV: 90.4 fL (ref 80.0–100.0)
Platelets: 222 10*3/uL (ref 150–400)
RBC: 4.06 MIL/uL — ABNORMAL LOW (ref 4.22–5.81)
RDW: 15.7 % — ABNORMAL HIGH (ref 11.5–15.5)
WBC: 11 10*3/uL — ABNORMAL HIGH (ref 4.0–10.5)
nRBC: 0 % (ref 0.0–0.2)

## 2023-09-18 LAB — COMPREHENSIVE METABOLIC PANEL WITH GFR
ALT: 39 U/L (ref 0–44)
AST: 28 U/L (ref 15–41)
Albumin: 3.2 g/dL — ABNORMAL LOW (ref 3.5–5.0)
Alkaline Phosphatase: 119 U/L (ref 38–126)
Anion gap: 7 (ref 5–15)
BUN: 11 mg/dL (ref 8–23)
CO2: 27 mmol/L (ref 22–32)
Calcium: 8.9 mg/dL (ref 8.9–10.3)
Chloride: 109 mmol/L (ref 98–111)
Creatinine, Ser: 0.9 mg/dL (ref 0.61–1.24)
GFR, Estimated: >60 mL/min (ref >60)
Glucose, Bld: 122 mg/dL — ABNORMAL HIGH (ref 70–99)
Potassium: 4.2 mmol/L (ref 3.5–5.1)
Sodium: 143 mmol/L (ref 135–145)
Total Bilirubin: 1 mg/dL (ref 0.0–1.2)
Total Protein: 6.5 g/dL (ref 6.5–8.1)

## 2023-09-18 LAB — URINE DRUG SCREEN, QUALITATIVE (ARMC ONLY)
Amphetamines, Ur Screen: NOT DETECTED
Barbiturates, Ur Screen: NOT DETECTED
Benzodiazepine, Ur Scrn: NOT DETECTED
Cannabinoid 50 Ng, Ur ~~LOC~~: NOT DETECTED
Cocaine Metabolite,Ur ~~LOC~~: NOT DETECTED
MDMA (Ecstasy)Ur Screen: NOT DETECTED
Methadone Scn, Ur: NOT DETECTED
Opiate, Ur Screen: POSITIVE — AB
Phencyclidine (PCP) Ur S: NOT DETECTED
Tricyclic, Ur Screen: NOT DETECTED

## 2023-09-18 LAB — PHOSPHORUS: Phosphorus: 2.9 mg/dL (ref 2.5–4.6)

## 2023-09-18 LAB — MAGNESIUM: Magnesium: 2.1 mg/dL (ref 1.7–2.4)

## 2023-09-18 MED ORDER — TAPENTADOL HCL ER 100 MG PO TB12: 100.0000 mg | ORAL_TABLET | Freq: Two times a day (BID) | ORAL | Status: DC

## 2023-09-18 MED ORDER — DULOXETINE HCL 30 MG PO CPEP: 30.0000 mg | ORAL_CAPSULE | Freq: Every day | ORAL | Status: DC

## 2023-09-18 MED ORDER — LOSARTAN POTASSIUM 50 MG PO TABS: 100.0000 mg | ORAL_TABLET | Freq: Every day | ORAL | Status: DC

## 2023-09-18 MED ORDER — OXYCODONE HCL ER 10 MG PO T12A: 10.0000 mg | EXTENDED_RELEASE_TABLET | Freq: Two times a day (BID) | ORAL | Status: DC

## 2023-09-18 MED ORDER — OXYCODONE-ACETAMINOPHEN 5-325 MG PO TABS
1.0000 | ORAL_TABLET | Freq: Three times a day (TID) | ORAL | 0 refills | Status: DC | PRN
Start: 2023-09-18 — End: 2024-03-09

## 2023-09-18 MED ORDER — OXYCODONE-ACETAMINOPHEN 5-325 MG PO TABS
1.0000 | ORAL_TABLET | Freq: Once | ORAL | Status: AC
  Administered 2023-09-18: 1 via ORAL
  Filled 2023-09-18: qty 1

## 2023-09-18 MED ORDER — CHLORTHALIDONE 25 MG PO TABS
25.0000 mg | ORAL_TABLET | Freq: Every day | ORAL | Status: DC
  Filled 2023-09-18: qty 1

## 2023-09-18 MED ORDER — HYDROXYZINE HCL 25 MG PO TABS: 25.0000 mg | ORAL_TABLET | Freq: Two times a day (BID) | ORAL | Status: DC | PRN

## 2023-09-18 NOTE — Care Management Obs Status (Signed)
 MEDICARE OBSERVATION STATUS NOTIFICATION   Patient Details  Name: Earl Murray MRN: [id number] Date of Birth: [birth year]   Medicare Observation Status Notification Given:  Yes    Odilia Bennett, LCSW 09/18/2023, 9:34 AM

## 2023-09-18 NOTE — Consult Note (Signed)
 PHARMACY CONSULT NOTE - ELECTROLYTES  Pharmacy Consult for Electrolyte Monitoring and Replacement   Recent Labs: Height: 6\' 5"  (195.6 cm) Weight: 90.7 kg (200 lb) IBW/kg (Calculated) : 89.1 Estimated Creatinine Clearance: 97.6 mL/min (by C-G formula based on SCr of 0.9 mg/dL). Potassium (mmol/L)  Date Value  09/18/2023 4.2   Magnesium  (mg/dL)  Date Value  16/02/9603 2.1   Calcium  (mg/dL)  Date Value  54/01/8118 8.9   Albumin (g/dL)  Date Value  14/78/2956 3.2 (L)   Phosphorus (mg/dL)  Date Value  21/30/8657 2.9   Sodium (mmol/L)  Date Value  09/18/2023 143    Assessment  Earl Murray is a 68 y.o. male presenting with abdominal pain with choledocholithiasis. PMH significant for HTN, HLD, GERD, PE on Eliquis . Pharmacy has been consulted to monitor and replace electrolytes.  Diet: Regular diet MIVF: NS @ 100 mL/hr Pertinent medications: N/A  Goal of Therapy: Electrolytes WNL  Plan:  No electrolyte replacement indicated at this time Check BMP tomorrow AM  Thank you for allowing pharmacy to be a part of this patient's care.  Page Boast 09/18/2023 6:49 AM

## 2023-09-18 NOTE — Discharge Summary (Signed)
 Physician Discharge Summary   Patient: Earl Murray MRN: [id number] DOB: 1955-10-15  Admit date:     09/17/2023  Discharge date: 09/18/23  Discharge Physician: Melvinia Stager   PCP: [facility]   Recommendations at discharge:   follow-up G.I. Dr. Emerick Hanlon in 1 to 2 weeks               Discharge Diagnoses: Active Problems:   Choledocholithiasis   Hypertension   Dyslipidemia   Cocaine dependence in remission (HCC)   Obstructive sleep apnea   Stage 3a chronic kidney disease (HCC)   Tobacco use   GERD without esophagitis   Pulmonary embolism (HCC)  Earl Murray is a 68 y.o. male with medical history significant of s/p of cholecystectomy, HTN, HLD, prediabetes, GERD, depression, PE on Eliquis , OSA, former smoker, cocaine abuse in remission, pancreatitis, kidney stone, bladder cancer, chronic low back pain, choledocholithiasis presenting w/ choledocholithiasis.   Patient noted to have been admitted April 2 through April 4 for issues including choledocholithiasis. Had MRCP showing numerous new common bile duct stones measuring up to 1.7 cm. Had a ERCP which had removal of stones and resulted in patent common bile duct. Patient reports mild improvement in pain after discharge.   MRCP 09/17/23:   1. Choledocholithiasis with a single 1.8 by 1.1 by 0.9 cm common hepatic duct stone (previously on 08/25/2023 there were at least 7 similar sized stones in the common bile duct and common hepatic duct). Mildly blunted/truncated distal CBD making it difficult to exclude distal choledocholithiasis, although this could also be due to swelling at the ampulla. 2. Pneumobilia is observed compatible with prior sphincterotomy and persistent biliary dilatation along with mild proximal intrahepatic biliary dilatation. 3. Heterogeneous enhancement in the parenchyma in the arterial phase with some geographic reduced density in most of the left hepatic lobe on late arterial phase images. The degree of  enhancement even is out on portal venous and delayed phase images. Although this is relatively mild, the possibility of low-grade vasculitis or low-grade hepatic inflammation resulting in heterogeneous early enhancement is suggested. 4. Likely reactive lymph nodes in the right gastric and porta hepatis regions. 5. Posterolateral rod and pedicle screw fixation extending from T10 through S1 and also with iliac bone screws, but with lack of pedicle screw attachment at the compressed L4 level.   Choledocholithiasis --Recurrent mild to moderate central abdominal pain with noted Choledocholithiasis with a single 1.8 by 1.1 by 0.9 cm common hepatic duct stone.  --Noted recent ERCP April 3 --LFTs grossly stable  --s/p  repeat ERCP with Dr. Ole Berkeley  - The entire main bile duct and common bile duct were dilated.  - Choledocholithiasis was found. Complete removal was  accomplished by balloon extraction.  - The biliary tree was swept. -- Patient tolerating diet. Labs remains stable. Discussed with Dr. Emerick Hanlon okay to discharge from G.I. standpoint. Discharge plan discussed with patient.    Dyslipidemia --Cont statin    Hypertension --BP stable    Pulmonary embolism (HCC) --On Eliquis    GERD without esophagitis --PPI   Tobacco use --1/4 PPD smoker  --Discussed cessation    Stage 3a chronic kidney disease (HCC) Cr 1.25 w/ GFR >60    discharge to home with outpatient follow-up G.I. and PCP. Patient in agreement      Pain control - [place]  Controlled Substance Reporting System database was reviewed. and patient was instructed, not to drive, operate heavy machinery, perform activities at heights, swimming or participation in water activities or  provide baby-sitting services while on Pain, Sleep and Anxiety Medications; until their outpatient Physician has advised to do so again. Also recommended to not to take more than prescribed Pain, Sleep and Anxiety Medications.  Consultants:  G.I. Dr. Ole Berkeley and [place] Procedures performed: ERCP Disposition: Home Diet recommendation:  Discharge Diet Orders (From admission, onward)     Start     Ordered   09/18/23 0000  Diet - low sodium heart healthy        09/18/23 1105           Cardiac diet DISCHARGE MEDICATION: Allergies as of 09/18/2023       Reactions   Gabapentin  Other (See Comments)   sedation   Toradol  [ketorolac  Tromethamine ] Itching   Per pt it makes him itch.    Motrin [ibuprofen] Itching, Other (See Comments)   Tramadol Rash   Tylenol  With Codeine #3 [acetaminophen -codeine] Itching        Medication List     STOP taking these medications    tapentadol 100 MG 12 hr tablet Commonly known as: NUCYNTA       TAKE these medications    amLODipine  10 MG tablet Commonly known as: NORVASC  Take 10 mg by mouth daily.   apixaban  5 MG Tabs tablet Commonly known as: ELIQUIS  Take 5 mg by mouth 2 (two) times daily.   atorvastatin  40 MG tablet Commonly known as: LIPITOR Take 20 mg by mouth every evening.   chlorthalidone 25 MG tablet Commonly known as: HYGROTON Take 25 mg by mouth daily.   DULoxetine 30 MG capsule Commonly known as: CYMBALTA Take 30 mg by mouth daily.   HYDROPHILIC EX Apply 1 Application topically daily as needed (dry skin).   hydrOXYzine 25 MG tablet Commonly known as: ATARAX Take 25 mg by mouth 2 (two) times daily as needed for anxiety.   losartan  100 MG tablet Commonly known as: COZAAR  Take 100 mg by mouth daily. What changed: Another medication with the same name was removed. Continue taking this medication, and follow the directions you see here.   MENTHOL-METHYL SALICYLATE EX Apply 1 Application topically 4 (four) times daily as needed (pain relief).   naloxone 4 MG/0.1ML Liqd nasal spray kit Commonly known as: NARCAN Place 1 spray into the nose once as needed (opiod overdose).   oxyCODONE -acetaminophen  5-325 MG tablet Commonly known as: Percocet Take 1  tablet by mouth every 8 (eight) hours as needed for severe pain (pain score 7-10).   PARoxetine  40 MG tablet Commonly known as: PAXIL  Take 20 mg by mouth every morning.   tiZANidine  4 MG tablet Commonly known as: ZANAFLEX  Take 2 tablets (8 mg total) by mouth daily as needed for muscle spasms.        Follow-up Information     [facility], [facility]. Schedule an appointment as soon as possible for a visit in 1 week(s).   Specialty: General Practice Contact information: [address] [place] [address] [phone]         Shane Darling, MD. Schedule an appointment as soon as possible for a visit in 1 week(s).   Specialty: Gastroenterology Why: Choledocholithiasis f/u s/p ERCP Contact information: [address] [place] [address] [phone]                Discharge Exam: Cleavon Curls Weights   09/17/23 0322  Weight: 90.7 kg   Alert and oriented times three respiratory clear to auscultation cardiovascular both heart sounds normal neuro- grossly intact  Condition at  discharge: fair  The results of significant diagnostics from this hospitalization (including imaging, microbiology, ancillary and laboratory) are listed below for reference.   Imaging Studies: DG C-Arm 1-60 Min-No Report Result Date: 09/17/2023 Fluoroscopy was utilized by the requesting physician.  No radiographic interpretation.   MR ABDOMEN MRCP W WO CONTAST Result Date: 09/17/2023 CLINICAL DATA:  Epigastric pain, recent choledocholithiasis. EXAM: MRI ABDOMEN WITHOUT AND WITH CONTRAST (INCLUDING MRCP) TECHNIQUE: Multiplanar multisequence MR imaging of the abdomen was performed both before and after the administration of intravenous contrast. Heavily T2-weighted images of the biliary and pancreatic ducts were obtained, and three-dimensional MRCP images were rendered by post processing. Post-processing was applied at the acquisition scanner with concurrent physician supervision which  includes 3D reconstructions, MIPs, volume rendered images and/or shaded surface rendering. CONTRAST:  9mL GADAVIST  GADOBUTROL  1 MMOL/ML IV SOLN COMPARISON:  CT angiogram 09/17/2023 MRCP from 08/25/2023 FINDINGS: Lower chest: Unremarkable Hepatobiliary: Cholecystectomy. Dilated common hepatic duct at 1.9 cm in dilated common bile duct at 1.6 cm mild proximal intrahepatic biliary dilatation. Pneumobilia is observed along with a 1.8 by 1.1 by 0.9 cm common hepatic duct stone on image 7 series 15 (previously on 08/25/2023 there were at least 7 similar sized stones in the common bile duct and common hepatic duct). Mildly dilated cystic duct remnant. Mildly blunted/truncated distal CBD for example on image 43 series 13 making it difficult to exclude distal choledocholithiasis. Heterogeneous enhancement in the parenchyma in the arterial phase with some geographic reduced density in most of the left hepatic lobe on late arterial phase images. The degree of enhancement even is out on portal venous and delayed phase images. No findings of hepatic vein thrombosis or portal vein thrombosis Pancreas:  Unremarkable Spleen:  Unremarkable Adrenals/Urinary Tract: Benign renal cysts warrant no further follow up. Adrenal glands unremarkable. Stomach/Bowel: Unremarkable Vascular/Lymphatic: Likely reactive lymph nodes in the right gastric and porta hepatis regions. Other:  No supplemental non-categorized findings. Musculoskeletal: Posterolateral rod and pedicle screw fixation extending from T10 through S1 and also with iliac bone screws, but with lack of pedicle screw attachment at the compressed L4 level. IMPRESSION: 1. Choledocholithiasis with a single 1.8 by 1.1 by 0.9 cm common hepatic duct stone (previously on 08/25/2023 there were at least 7 similar sized stones in the common bile duct and common hepatic duct). Mildly blunted/truncated distal CBD making it difficult to exclude distal choledocholithiasis, although this could also  be due to swelling at the ampulla. 2. Pneumobilia is observed compatible with prior sphincterotomy and persistent biliary dilatation along with mild proximal intrahepatic biliary dilatation. 3. Heterogeneous enhancement in the parenchyma in the arterial phase with some geographic reduced density in most of the left hepatic lobe on late arterial phase images. The degree of enhancement even is out on portal venous and delayed phase images. Although this is relatively mild, the possibility of low-grade vasculitis or low-grade hepatic inflammation resulting in heterogeneous early enhancement is suggested. 4. Likely reactive lymph nodes in the right gastric and porta hepatis regions. 5. Posterolateral rod and pedicle screw fixation extending from T10 through S1 and also with iliac bone screws, but with lack of pedicle screw attachment at the compressed L4 level. Electronically Signed   By: Freida Jes M.D.   On: 09/17/2023 09:03   MR 3D Recon At Scanner Result Date: 09/17/2023 CLINICAL DATA:  Epigastric pain, recent choledocholithiasis. EXAM: MRI ABDOMEN WITHOUT AND WITH CONTRAST (INCLUDING MRCP) TECHNIQUE: Multiplanar multisequence MR imaging of the abdomen was performed both before and after the  administration of intravenous contrast. Heavily T2-weighted images of the biliary and pancreatic ducts were obtained, and three-dimensional MRCP images were rendered by post processing. Post-processing was applied at the acquisition scanner with concurrent physician supervision which includes 3D reconstructions, MIPs, volume rendered images and/or shaded surface rendering. CONTRAST:  9mL GADAVIST  GADOBUTROL  1 MMOL/ML IV SOLN COMPARISON:  CT angiogram 09/17/2023 MRCP from 08/25/2023 FINDINGS: Lower chest: Unremarkable Hepatobiliary: Cholecystectomy. Dilated common hepatic duct at 1.9 cm in dilated common bile duct at 1.6 cm mild proximal intrahepatic biliary dilatation. Pneumobilia is observed along with a 1.8 by 1.1  by 0.9 cm common hepatic duct stone on image 7 series 15 (previously on 08/25/2023 there were at least 7 similar sized stones in the common bile duct and common hepatic duct). Mildly dilated cystic duct remnant. Mildly blunted/truncated distal CBD for example on image 43 series 13 making it difficult to exclude distal choledocholithiasis. Heterogeneous enhancement in the parenchyma in the arterial phase with some geographic reduced density in most of the left hepatic lobe on late arterial phase images. The degree of enhancement even is out on portal venous and delayed phase images. No findings of hepatic vein thrombosis or portal vein thrombosis Pancreas:  Unremarkable Spleen:  Unremarkable Adrenals/Urinary Tract: Benign renal cysts warrant no further follow up. Adrenal glands unremarkable. Stomach/Bowel: Unremarkable Vascular/Lymphatic: Likely reactive lymph nodes in the right gastric and porta hepatis regions. Other:  No supplemental non-categorized findings. Musculoskeletal: Posterolateral rod and pedicle screw fixation extending from T10 through S1 and also with iliac bone screws, but with lack of pedicle screw attachment at the compressed L4 level. IMPRESSION: 1. Choledocholithiasis with a single 1.8 by 1.1 by 0.9 cm common hepatic duct stone (previously on 08/25/2023 there were at least 7 similar sized stones in the common bile duct and common hepatic duct). Mildly blunted/truncated distal CBD making it difficult to exclude distal choledocholithiasis, although this could also be due to swelling at the ampulla. 2. Pneumobilia is observed compatible with prior sphincterotomy and persistent biliary dilatation along with mild proximal intrahepatic biliary dilatation. 3. Heterogeneous enhancement in the parenchyma in the arterial phase with some geographic reduced density in most of the left hepatic lobe on late arterial phase images. The degree of enhancement even is out on portal venous and delayed phase images.  Although this is relatively mild, the possibility of low-grade vasculitis or low-grade hepatic inflammation resulting in heterogeneous early enhancement is suggested. 4. Likely reactive lymph nodes in the right gastric and porta hepatis regions. 5. Posterolateral rod and pedicle screw fixation extending from T10 through S1 and also with iliac bone screws, but with lack of pedicle screw attachment at the compressed L4 level. Electronically Signed   By: Freida Jes M.D.   On: 09/17/2023 09:03   CT Angio Chest/Abd/Pel for Dissection W and/or Wo Contrast Result Date: 09/17/2023 CLINICAL DATA:  Central chest pain that began yesterday. EXAM: CT ANGIOGRAPHY CHEST, ABDOMEN AND PELVIS TECHNIQUE: 08/25/2023 Multidetector CT imaging through the chest, abdomen and pelvis was performed using the standard protocol during bolus administration of intravenous contrast. Multiplanar reconstructed images and MIPs were obtained and reviewed to evaluate the vascular anatomy. RADIATION DOSE REDUCTION: This exam was performed according to the departmental dose-optimization program which includes automated exposure control, adjustment of the mA and/or kV according to patient size and/or use of iterative reconstruction technique. CONTRAST:  OMNIPAQUE  IOHEXOL  350 MG/ML SOLN COMPARISON:  None Available. FINDINGS: CTA CHEST FINDINGS Cardiovascular: Preferential opacification of the thoracic aorta. No evidence of thoracic  aortic aneurysm or dissection. Normal heart size. No pericardial effusion. No pulmonary artery filling defects seen. Mild atheromatous plaque to the aorta. Mediastinum/Nodes: Negative for mass or adenopathy. Lungs/Pleura: Central airways are clear. There is no edema, consolidation, effusion, or pneumothorax. Musculoskeletal: No acute or aggressive finding. Review of the MIP images confirms the above findings. CTA ABDOMEN AND PELVIS FINDINGS VASCULAR Aorta: Tortuous with atheromatous plaque diffusely. No  dissection, inflammatory wall thickening, aneurysm, or stenosis. Celiac: Widely patent and smoothly contoured branches SMA: Widely patent with smoothly contoured branches Renals: Unremarkable IMA: Patent Inflow: Generalized atheromatous plaque. No stenosis, ulceration, or aneurysm. Veins: Unremarkable Review of the MIP images confirms the above findings. NON-VASCULAR Hepatobiliary: No focal liver abnormality.Pneumobilia suggesting prior sphincterotomy. Choledocholithiasis by recent MRI. Cholecystectomy. Pancreas: No acute inflammation or other interval change. Spleen: Unremarkable. Adrenals/Urinary Tract: Negative adrenals. No hydronephrosis or stone. Left renal cyst with simple appearance allowing for streak artifact, 3.6 cm. Unremarkable bladder. Stomach/Bowel:  No obstruction. No visible bowel inflammation Lymphatic: No mass or adenopathy. Reproductive:No pathologic findings. Other: No ascites or pneumoperitoneum. Musculoskeletal: Lower thoracic to pelvic fusion without acute osseous finding Review of the MIP images confirms the above findings. IMPRESSION: Negative for acute aortic syndrome. Aortic atherosclerosis and other chronic findings remain stable. Electronically Signed   By: Ronnette Coke M.D.   On: 09/17/2023 05:12   DG C-Arm 1-60 Min-No Report Result Date: 08/26/2023 Fluoroscopy was utilized by the requesting physician.  No radiographic interpretation.   MR ABDOMEN MRCP W WO CONTAST Result Date: 08/25/2023 CLINICAL DATA:  Abdominal pain.  Elevated liver function tests. EXAM: MRI ABDOMEN WITHOUT AND WITH CONTRAST (INCLUDING MRCP) TECHNIQUE: Multiplanar multisequence MR imaging of the abdomen was performed both before and after the administration of intravenous contrast. Heavily T2-weighted images of the biliary and pancreatic ducts were obtained, and three-dimensional MRCP images were rendered by post processing. CONTRAST:  9mL GADAVIST  GADOBUTROL  1 MMOL/ML IV SOLN COMPARISON:  11/25/2019 FINDINGS:  Lower chest: No acute findings. Hepatobiliary: No hepatic masses identified. Prior cholecystectomy. Diffuse biliary ductal dilatation shows mild increase since prior MRI in 2021, with common bile duct measuring 20 mm in diameter. Smoothly tapered distal common bile duct stricture is seen near the ampulla. Numerous new calculi are seen throughout the common bile duct, measuring up to 1.7 cm in diameter. Low cystic duct insertion into the distal common bile duct also noted. Pancreas: No mass or inflammatory changes. No evidence of pancreatic ductal dilatation. Spleen:  Within normal limits in size and appearance. Adrenals/Urinary Tract: Benign Bosniak category 1 renal cysts are again seen bilaterally (No followup imaging is recommended). No suspicious masses identified. No evidence of hydronephrosis. Stomach/Bowel: Unremarkable. Vascular/Lymphatic: No pathologically enlarged lymph nodes identified. No acute vascular findings. Other:  None. Musculoskeletal:  No suspicious bone lesions identified. IMPRESSION: Prior cholecystectomy. Diffuse biliary ductal dilatation with common bile duct measuring 20 mm, mildly increased since 2021 exam. Smoothly tapered distal common bile duct stricture, with numerous new common bile duct calculi measuring up to 1.7 cm in diameter. Electronically Signed   By: Marlyce Sine M.D.   On: 08/25/2023 22:01   MR 3D Recon At Scanner Result Date: 08/25/2023 CLINICAL DATA:  Abdominal pain.  Elevated liver function tests. EXAM: MRI ABDOMEN WITHOUT AND WITH CONTRAST (INCLUDING MRCP) TECHNIQUE: Multiplanar multisequence MR imaging of the abdomen was performed both before and after the administration of intravenous contrast. Heavily T2-weighted images of the biliary and pancreatic ducts were obtained, and three-dimensional MRCP images were rendered by post processing. CONTRAST:  9mL GADAVIST  GADOBUTROL  1 MMOL/ML IV SOLN COMPARISON:  11/25/2019 FINDINGS: Lower chest: No acute findings. Hepatobiliary:  No hepatic masses identified. Prior cholecystectomy. Diffuse biliary ductal dilatation shows mild increase since prior MRI in 2021, with common bile duct measuring 20 mm in diameter. Smoothly tapered distal common bile duct stricture is seen near the ampulla. Numerous new calculi are seen throughout the common bile duct, measuring up to 1.7 cm in diameter. Low cystic duct insertion into the distal common bile duct also noted. Pancreas: No mass or inflammatory changes. No evidence of pancreatic ductal dilatation. Spleen:  Within normal limits in size and appearance. Adrenals/Urinary Tract: Benign Bosniak category 1 renal cysts are again seen bilaterally (No followup imaging is recommended). No suspicious masses identified. No evidence of hydronephrosis. Stomach/Bowel: Unremarkable. Vascular/Lymphatic: No pathologically enlarged lymph nodes identified. No acute vascular findings. Other:  None. Musculoskeletal:  No suspicious bone lesions identified. IMPRESSION: Prior cholecystectomy. Diffuse biliary ductal dilatation with common bile duct measuring 20 mm, mildly increased since 2021 exam. Smoothly tapered distal common bile duct stricture, with numerous new common bile duct calculi measuring up to 1.7 cm in diameter. Electronically Signed   By: Marlyce Sine M.D.   On: 08/25/2023 22:01   CT Angio Chest/Abd/Pel for Dissection W and/or W/WO Result Date: 08/25/2023 CLINICAL DATA:  Acute aortic syndrome (AAS) suspected abd pain, also considered mesenteric ischemia. Upper abdominal pain and right and central chest pain. EXAM: CT ANGIOGRAPHY CHEST, ABDOMEN AND PELVIS TECHNIQUE: Non-contrast CT of the chest was initially obtained. Multidetector CT imaging through the chest, abdomen and pelvis was performed using the standard protocol during bolus administration of intravenous contrast. Multiplanar reconstructed images and MIPs were obtained and reviewed to evaluate the vascular anatomy. RADIATION DOSE REDUCTION: This exam  was performed according to the departmental dose-optimization program which includes automated exposure control, adjustment of the mA and/or kV according to patient size and/or use of iterative reconstruction technique. CONTRAST:  OMNIPAQUE  IOHEXOL  350 MG/ML SOLN COMPARISON:  CT Angiography chest and CT scan abdomen and pelvis from 08/19/2023. FINDINGS: CTA CHEST FINDINGS Cardiovascular: No intramural hematoma noted in the thoracic aorta on the unenhanced images. Thoracic aorta is normal in caliber without aneurysm, dissection, vasculitis or significant stenosis. There is also satisfactory opacification of pulmonary arteries. No embolism seen to the proximal subsegmental pulmonary artery level. Normal cardiac size. No pericardial effusion. No aortic aneurysm. There are coronary artery calcifications, in keeping with coronary artery disease. There are also mild peripheral atherosclerotic vascular calcifications of thoracic aorta and its major branches. Mediastinum/Nodes: Visualized thyroid gland appears grossly unremarkable. No solid / cystic mediastinal masses. The esophagus is nondistended precluding optimal assessment. No axillary, mediastinal or hilar lymphadenopathy by size criteria. Lungs/Pleura: The central tracheo-bronchial tree is patent. No mass or consolidation. No pleural effusion or pneumothorax. No suspicious lung nodules. Musculoskeletal: The visualized soft tissues of the chest wall are grossly unremarkable. No suspicious osseous lesions. There are mild multilevel degenerative changes in the visualized spine. Review of the MIP images confirms the above findings. CTA ABDOMEN AND PELVIS FINDINGS VASCULAR Aorta: Normal caliber aorta without aneurysm, dissection, vasculitis or significant stenosis. Celiac: Patent without evidence of aneurysm, dissection, vasculitis or significant stenosis. SMA: Patent without evidence of aneurysm, dissection, vasculitis or significant stenosis. Renals: Both renal  arteries are patent without evidence of aneurysm, dissection, vasculitis, fibromuscular dysplasia or significant stenosis. IMA: Patent without evidence of aneurysm, dissection, vasculitis or significant stenosis. Inflow: Patent without evidence of aneurysm, dissection, vasculitis or significant stenosis. Veins: No  obvious venous abnormality within the limitations of this arterial phase study. Review of the MIP images confirms the above findings. NON-VASCULAR Hepatobiliary: The liver is normal in size. Non-cirrhotic configuration. No suspicious mass. No intrahepatic or extrahepatic bile duct dilation. Redemonstration of small amount of pneumobilia medially the left low, decreased since the prior study. Gallbladder is surgically absent. Pancreas: Evaluation of pancreatic head/neck and proximal body is limited due to extensive streak artifacts from spinal fixation hardware. However, no significant pancreatic abnormality seen. Spleen: Within normal limits. No focal lesion. Adrenals/Urinary Tract: Adrenal glands are unremarkable. No suspicious renal mass within the limitations of exam due to extensive streak artifacts from spinal fixation hardware. There are at least 2 simple cysts in the left kidney, essentially similar to the prior study. No nephroureterolithiasis or obstructive uropathy. Unremarkable urinary bladder. Stomach/Bowel: No disproportionate dilation of the small or large bowel loops. No evidence of abnormal bowel wall thickening or inflammatory changes. The appendix is unremarkable. Vascular/Lymphatic: No ascites or pneumoperitoneum. No abdominal or pelvic lymphadenopathy, by size criteria. No aneurysmal dilation of the major abdominal arteries. There are mild peripheral atherosclerotic vascular calcifications of the aorta and its major branches. Reproductive: Normal size prostate. Symmetric seminal vesicles. Other: The visualized soft tissues and abdominal wall are unremarkable. Musculoskeletal: No  suspicious osseous lesions. There are mild multilevel degenerative changes in the visualized spine. Redemonstration of thoracolumbar spinal fixation hardware. Review of the MIP images confirms the above findings. IMPRESSION: 1. Examination is mildly degraded by extensive streak artifacts from thoracolumbar spinal fixation hardware. 2. No acute aortic syndrome. No acute thoracic aortic intramural hematoma. No thoracoabdominal aortic aneurysm, dissection, penetrating atherosclerotic ulcer or vasculitis. 3. No acute inflammatory process identified within the chest, abdomen or pelvis. 4. Multiple other nonacute observations, as described above. Aortic Atherosclerosis (ICD10-I70.0). Electronically Signed   By: Beula Brunswick M.D.   On: 08/25/2023 17:07   DG Chest 2 View Result Date: 08/25/2023 CLINICAL DATA:  Chest pressure. EXAM: CHEST - 2 VIEW COMPARISON:  CTA chest dated 08/19/2023. Chest radiograph dated 08/09/2022. FINDINGS: Stable cardiomediastinal silhouette. No focal consolidation, pleural effusion, or pneumothorax. No acute osseous abnormality. Partially visualized thoracolumbar fixation hardware. IMPRESSION: No acute cardiopulmonary findings. Electronically Signed   By: Mannie Seek M.D.   On: 08/25/2023 13:42   NM Myocar Multi W/Spect W/Wall Motion / EF Result Date: 08/20/2023   The study is normal. The study is low risk.   No ST deviation was noted.   LV perfusion is normal. There is no evidence of ischemia. There is no evidence of infarction.   Left ventricular function is normal. Nuclear stress EF: 64%. End diastolic cavity size is normal. End systolic cavity size is normal.   CT attenuation images showed minimal aortic calcifications.   CT ABDOMEN PELVIS W CONTRAST Result Date: 08/19/2023 CLINICAL DATA:  Chest pain and back pain. EXAM: CT ABDOMEN AND PELVIS WITH CONTRAST TECHNIQUE: Multidetector CT imaging of the abdomen and pelvis was performed using the standard protocol following bolus  administration of intravenous contrast. RADIATION DOSE REDUCTION: This exam was performed according to the departmental dose-optimization program which includes automated exposure control, adjustment of the mA and/or kV according to patient size and/or use of iterative reconstruction technique. CONTRAST:  OMNIPAQUE  IOHEXOL  350 MG/ML SOLN COMPARISON:  January 31, 2023 FINDINGS: Lower chest: No acute abnormality. Hepatobiliary: No focal liver abnormality is seen. Stable, moderate severity pneumobilia is noted. Status post cholecystectomy. The common bile duct measures 2.0 cm in diameter. Pancreas: Unremarkable. No pancreatic ductal  dilatation or surrounding inflammatory changes. Spleen: Normal in size without focal abnormality. Adrenals/Urinary Tract: Adrenal glands are unremarkable. Kidneys are normal in size, without renal calculi or hydronephrosis. Stable simple cysts are seen within the left kidney. Bladder is unremarkable. Stomach/Bowel: Stomach is within normal limits. Appendix appears normal. No evidence of bowel wall thickening, distention, or inflammatory changes. Vascular/Lymphatic: Aortic atherosclerosis. No enlarged abdominal or pelvic lymph nodes. Reproductive: Prostate is unremarkable. Other: No abdominal wall hernia or abnormality. No abdominopelvic ascites. Musculoskeletal: Stable chronic, degenerative and postoperative changes are seen throughout the thoracolumbar spine. IMPRESSION: 1. Evidence of prior cholecystectomy with stable, moderate severity pneumobilia. 2. Stable simple left renal cysts. No follow-up imaging is recommended. This recommendation follows ACR consensus guidelines: Management of the Incidental Renal Mass on CT: A White Paper of the ACR Incidental Findings Committee. J Am Coll Radiol 2018;15:264-273. 3. Stable chronic, degenerative and postoperative changes throughout the thoracolumbar spine. 4. Aortic atherosclerosis. Aortic Atherosclerosis (ICD10-I70.0). Electronically  Signed   By: Virgle Grime M.D.   On: 08/19/2023 21:32   CT Angio Chest PE W and/or Wo Contrast Result Date: 08/19/2023 CLINICAL DATA:  Chest pain for 2 days EXAM: CT ANGIOGRAPHY CHEST WITH CONTRAST TECHNIQUE: Multidetector CT imaging of the chest was performed using the standard protocol during bolus administration of intravenous contrast. Multiplanar CT image reconstructions and MIPs were obtained to evaluate the vascular anatomy. RADIATION DOSE REDUCTION: This exam was performed according to the departmental dose-optimization program which includes automated exposure control, adjustment of the mA and/or kV according to patient size and/or use of iterative reconstruction technique. CONTRAST:  OMNIPAQUE  IOHEXOL  350 MG/ML SOLN COMPARISON:  10/12/2021, chest x-ray from 08/09/2022 FINDINGS: Cardiovascular: Thoracic aorta demonstrates atherosclerotic calcifications without aneurysmal dilatation. The degree of opacification is limited precluding evaluation for dissection. The pulmonary artery shows a normal branching pattern bilaterally. No intraluminal filling defect to suggest pulmonary embolism is noted. Mediastinum/Nodes: Thoracic inlet is within normal limits. No hilar or mediastinal adenopathy is noted. The esophagus as visualized is within normal limits. Lungs/Pleura: Lungs are well aerated bilaterally. No focal infiltrate or sizable effusion is seen. No parenchymal nodules are noted. Upper Abdomen: Visualized upper abdomen shows pneumobilia. No other focal abnormality is seen. Musculoskeletal: Postsurgical changes are noted in the lower thoracic spine. No acute rib abnormality is noted. Review of the MIP images confirms the above findings. IMPRESSION: No evidence of pulmonary emboli. Pneumobilia stable from prior CT of the abdomen. Aortic Atherosclerosis (ICD10-I70.0). Electronically Signed   By: Violeta Grey M.D.   On: 08/19/2023 21:27    Microbiology: Results for orders placed or performed  during the hospital encounter of 08/25/23  Culture, blood (Routine X 2) w Reflex to ID Panel     Status: None   Collection Time: 08/26/23  6:02 AM   Specimen: BLOOD RIGHT ARM  Result Value Ref Range Status   Specimen Description BLOOD RIGHT ARM  Final   Special Requests   Final    BOTTLES DRAWN AEROBIC AND ANAEROBIC Blood Culture adequate volume   Culture   Final    NO GROWTH 5 DAYS Performed at [facility], [address] [place], [place] [address]    Report Status 08/31/2023 FINAL  Final  Culture, blood (Routine X 2) w Reflex to ID Panel     Status: None   Collection Time: 08/26/23  6:02 AM   Specimen: BLOOD LEFT ARM  Result Value Ref Range Status   Specimen Description BLOOD LEFT ARM  Final   Special  Requests   Final    BOTTLES DRAWN AEROBIC AND ANAEROBIC Blood Culture results may not be optimal due to an inadequate volume of blood received in culture bottles   Culture   Final    NO GROWTH 5 DAYS Performed at [facility], [address] [place], [place] [address]    Report Status 08/31/2023 FINAL  Final    Labs: CBC: Recent Labs  Lab 09/17/23 0328 09/18/23 0412  WBC 13.5* 11.0*  HGB 12.7* 12.4*  HCT 37.8* 36.7*  MCV 90.0 90.4  PLT 221 222   Basic Metabolic Panel: Recent Labs  Lab 09/17/23 0328 09/18/23 0412  NA 138 143  K 3.0* 4.2  CL 106 109  CO2 24 27  GLUCOSE 140* 122*  BUN 14 11  CREATININE 1.25* 0.90  CALCIUM  8.9 8.9  MG  --  2.1  PHOS  --  2.9   Liver Function Tests: Recent Labs  Lab 09/17/23 0329 09/18/23 0412  AST 64* 28  ALT 43 39  ALKPHOS 118 119  BILITOT 1.9* 1.0  PROT 6.7 6.5  ALBUMIN 3.5 3.2*    Discharge time spent: greater than 30 minutes.  Signed: Melvinia Stager, MD [facility] 09/18/2023

## 2023-09-18 NOTE — Plan of Care (Signed)

## 2023-09-18 NOTE — Care Plan (Signed)
 Doing well after ERCP. No further GI needs. We will sign-off.  Olivia Bevel MD, MPH [facility]

## 2023-09-18 NOTE — Plan of Care (Signed)

## 2023-09-20 ENCOUNTER — Encounter: Payer: Self-pay | Admitting: Gastroenterology

## 2024-01-30 ENCOUNTER — Emergency Department

## 2024-01-30 ENCOUNTER — Emergency Department
Admission: EM | Admit: 2024-01-30 | Discharge: 2024-01-30 | Disposition: A | Discharge status: Home / Self Care | Attending: Emergency Medicine | Admitting: Emergency Medicine

## 2024-01-30 ENCOUNTER — Other Ambulatory Visit: Payer: Self-pay

## 2024-01-30 DIAGNOSIS — S0990XA Unspecified injury of head, initial encounter: Secondary | ICD-10-CM | POA: Diagnosis present

## 2024-01-30 DIAGNOSIS — I10 Essential (primary) hypertension: Secondary | ICD-10-CM | POA: Insufficient documentation

## 2024-01-30 DIAGNOSIS — Z7901 Long term (current) use of anticoagulants: Secondary | ICD-10-CM | POA: Diagnosis not present

## 2024-01-30 DIAGNOSIS — R1084 Generalized abdominal pain: Secondary | ICD-10-CM | POA: Insufficient documentation

## 2024-01-30 DIAGNOSIS — W108XXA Fall (on) (from) other stairs and steps, initial encounter: Secondary | ICD-10-CM | POA: Insufficient documentation

## 2024-01-30 DIAGNOSIS — M546 Pain in thoracic spine: Secondary | ICD-10-CM | POA: Insufficient documentation

## 2024-01-30 DIAGNOSIS — M545 Low back pain, unspecified: Secondary | ICD-10-CM | POA: Insufficient documentation

## 2024-01-30 DIAGNOSIS — D72829 Elevated white blood cell count, unspecified: Secondary | ICD-10-CM | POA: Insufficient documentation

## 2024-01-30 LAB — CBC
HCT: 40.2 % (ref 39.0–52.0)
Hemoglobin: 13.3 g/dL (ref 13.0–17.0)
MCH: 29.3 pg (ref 26.0–34.0)
MCHC: 33.1 g/dL (ref 30.0–36.0)
MCV: 88.5 fL (ref 80.0–100.0)
Platelets: 268 K/uL (ref 150–400)
RBC: 4.54 MIL/uL (ref 4.22–5.81)
RDW: 16.9 % — ABNORMAL HIGH (ref 11.5–15.5)
WBC: 15.8 K/uL — ABNORMAL HIGH (ref 4.0–10.5)
nRBC: 0 % (ref 0.0–0.2)

## 2024-01-30 LAB — URINALYSIS, ROUTINE W REFLEX MICROSCOPIC
Bilirubin Urine: NEGATIVE
Glucose, UA: NEGATIVE mg/dL
Hgb urine dipstick: NEGATIVE
Ketones, ur: NEGATIVE mg/dL
Leukocytes,Ua: NEGATIVE
Nitrite: NEGATIVE
Protein, ur: NEGATIVE mg/dL
Specific Gravity, Urine: 1.029 (ref 1.005–1.030)
pH: 6 (ref 5.0–8.0)

## 2024-01-30 LAB — COMPREHENSIVE METABOLIC PANEL WITH GFR
ALT: 12 U/L (ref 0–44)
AST: 18 U/L (ref 15–41)
Albumin: 3.8 g/dL (ref 3.5–5.0)
Alkaline Phosphatase: 81 U/L (ref 38–126)
Anion gap: 11 (ref 5–15)
BUN: 14 mg/dL (ref 8–23)
CO2: 28 mmol/L (ref 22–32)
Calcium: 9.2 mg/dL (ref 8.9–10.3)
Chloride: 104 mmol/L (ref 98–111)
Creatinine, Ser: 1 mg/dL (ref 0.61–1.24)
GFR, Estimated: >60 mL/min (ref >60)
Glucose, Bld: 108 mg/dL — ABNORMAL HIGH (ref 70–99)
Potassium: 3.3 mmol/L — ABNORMAL LOW (ref 3.5–5.1)
Sodium: 143 mmol/L (ref 135–145)
Total Bilirubin: 0.5 mg/dL (ref 0.0–1.2)
Total Protein: 7.1 g/dL (ref 6.5–8.1)

## 2024-01-30 LAB — LIPASE, BLOOD: Lipase: 35 U/L (ref 11–51)

## 2024-01-30 MED ORDER — HYDROMORPHONE HCL 1 MG/ML IJ SOLN
0.5000 mg | Freq: Once | INTRAMUSCULAR | Status: AC
  Administered 2024-01-30: 0.5 mg via INTRAVENOUS
  Filled 2024-01-30: qty 0.5

## 2024-01-30 MED ORDER — ONDANSETRON HCL 4 MG/2ML IJ SOLN
4.0000 mg | Freq: Once | INTRAMUSCULAR | Status: AC
  Administered 2024-01-30: 4 mg via INTRAVENOUS
  Filled 2024-01-30: qty 2

## 2024-01-30 MED ORDER — IOHEXOL 300 MG/ML  SOLN
100.0000 mL | Freq: Once | INTRAMUSCULAR | Status: AC | PRN
  Administered 2024-01-30: 100 mL via INTRAVENOUS

## 2024-01-30 MED ORDER — MORPHINE SULFATE (PF) 4 MG/ML IV SOLN
4.0000 mg | Freq: Once | INTRAVENOUS | Status: AC
  Administered 2024-01-30: 4 mg via INTRAVENOUS
  Filled 2024-01-30: qty 1

## 2024-01-30 NOTE — ED Triage Notes (Addendum)
 Pt comes via EMs from home with abdominal pain pt has chronic pain and it got wore few days ago. Pt also had N/V. Pt states none currently. Pt does have hx of kidney stones.  Pt did fall prior to EMS arrival. Pt states back pain. Pt denies any loc or thinners  Pt states he fell down 14 stairs. Pt states he has nerve damage and his legs gave out.

## 2024-01-30 NOTE — ED Notes (Signed)
 Pt placed in c-collar by this RN

## 2024-01-30 NOTE — Discharge Instructions (Addendum)
 You are seen in the ER today for evaluation of your abdominal pain and fall.  We fortunately did not find any serious injuries from your fall.  If you continue to have back pain, please arrange follow-up with your spine specialist.  Regarding your abdominal pain, your testing was overall reassuring.  Follow-up your primary care doctor for further evaluation of your abdominal pain.  Return to the ER for new or worsening symptoms.

## 2024-01-30 NOTE — ED Provider Notes (Signed)
 [facility] Provider Note    Event Date/Time   First MD Initiated Contact with Patient 01/30/24 1118     (approximate)   History   Abdominal Pain   HPI  Earl Murray is a 68 year old male with history of HTN, PE on Eliquis , prior choledocholithiasis here for evaluation of abdominal pain and subsequent fall.  A few days ago patient began to develop generalized abdominal pain with associated nausea and vomiting.  He was planning to seek care when he slipped and fell down 14 stairs in his house because his legs gave out.  Since his fall reports worsened lower back pain.      Physical Exam   Triage Vital Signs: ED Triage Vitals [01/30/24 1101]  Encounter Vitals Group     BP      Girls Systolic BP Percentile      Girls Diastolic BP Percentile      Boys Systolic BP Percentile      Boys Diastolic BP Percentile      Pulse Rate 72     Resp 18     Temp 98.4 F (36.9 C)     Temp Source Oral     SpO2 100 %     Weight 190 lb (86.2 kg)     Height 6' 5 (1.956 m)     Head Circumference      Peak Flow      Pain Score      Pain Loc      Pain Education      Exclude from Growth Chart     Most recent vital signs: Vitals:   01/30/24 1101 01/30/24 1122  Pulse: 72   Resp: 18   Temp: 98.4 F (36.9 C)   SpO2: 100% 100%   Nursing notes and vital signs reviewed.  General: Adult male, lying in bed, awake interactive Head: Atraumatic Spine: No cervical tenderness.  Mild tenderness throughout the thoracic spine, tenderness throughout the lumbar spine, palpable neurostimulator Chest: Symmetric chest rise, no tenderness to palpation.  Cardiac: Regular rhythm and rate.  Respiratory: Lungs clear to auscultation Abdomen: Soft, nondistended.  Generalized tenderness to palpation without rebound or guarding pelvis: Stable in AP and lateral compression. No tenderness to palpation. MSK: No deformity to bilateral upper and lower extremity.  Full range of motion of  bilateral upper extremities.  Pain with hip flexion of bilateral lower extremities worse on the right.   Neuro: Alert, oriented. GCS 15.  Intact sensation of bilateral upper and lower extremities Skin: No evidence of burns or lacerations.  ED Results / Procedures / Treatments   Labs (all labs ordered are listed, but only abnormal results are displayed) Labs Reviewed  COMPREHENSIVE METABOLIC PANEL WITH GFR - Abnormal; Notable for the following components:      Result Value   Potassium 3.3 (*)    Glucose, Bld 108 (*)    All other components within normal limits  CBC - Abnormal; Notable for the following components:   WBC 15.8 (*)    RDW 16.9 (*)    All other components within normal limits  URINALYSIS, ROUTINE W REFLEX MICROSCOPIC - Abnormal; Notable for the following components:   Color, Urine STRAW (*)    APPearance CLEAR (*)    All other components within normal limits  LIPASE, BLOOD     EKG EKG independently reviewed and interpreted by myself demonstrates:    RADIOLOGY Imaging independently reviewed and interpreted by myself demonstrates:  CT head without acute  bleed CT C-spine without acute fracture Hip x-Kyleen Villatoro without acute fracture CT chest abdomen pelvis without traumatic injury, radiology notes moderate pneumobilia and abnormal CBD dilation L-spine recon without acute fracture  Formal Radiology Read:  CT L-SPINE NO CHARGE Result Date: 01/30/2024 EXAM: CT OF THE LUMBAR SPINE WITHOUT CONTRAST 01/30/2024 12:31:32 PM TECHNIQUE: CT of the lumbar spine was performed without the administration of intravenous contrast. Multiplanar reformatted images are provided for review. Automated exposure control, iterative reconstruction, and/or weight based adjustment of the mA/kV was utilized to reduce the radiation dose to as low as reasonably achievable. COMPARISON: CT of the lumbar spine dated 07/16/2000. CLINICAL HISTORY: . FINDINGS: BONES AND ALIGNMENT: Moderate chronic compression  deformity of L4, which appears unchanged. Slight anterolisthesis at L3-4. Status post interbody fusion at L1-2 and L2-3. Status post decompression laminectomies L2 through S1. Bilateral posterolateral spinal fixation of the thoracolumbar spine and sacrum. The orthopedic hardware is intact. No evidence of acute traumatic injury or significant interval change. DEGENERATIVE CHANGES: No significant degenerative changes. SOFT TISSUES: No acute abnormality. IMPRESSION: 1. No evidence of acute traumatic injury or significant interval change. 2. Moderate chronic compression deformity of L4, unchanged. 3. Slight anterolisthesis at L3-4. 4. Status post interbody fusion at L1-2 and L2-3, and decompression laminectomies L2 through S1. 5. Bilateral posterolateral spinal fixation of the thoracolumbar spine and sacrum. The orthopedic hardware is intact. Electronically signed by: Evalene Coho MD 01/30/2024 12:56 PM EDT RP Workstation: [id number]   CT CHEST ABDOMEN PELVIS W CONTRAST Result Date: 01/30/2024 EXAM: CT CHEST, ABDOMEN AND PELVIS WITH CONTRAST 01/30/2024 12:31:32 PM TECHNIQUE: CT of the chest, abdomen and pelvis was performed with the administration of intravenous contrast. Multiplanar reformatted images are provided for review. Automated exposure control, iterative reconstruction, and/or weight based adjustment of the mA/kV was utilized to reduce the radiation dose to as low as reasonably achievable. COMPARISON: CT angiogram of the chest, abdomen and pelvis dated 09/17/2023. CLINICAL HISTORY: Polytrauma, blunt; abd pain before fall, fall down 14 stairs. FINDINGS: CHEST: MEDIASTINUM AND LYMPH NODES: Heart and pericardium are unremarkable. The central airways are clear. No mediastinal, hilar or axillary lymphadenopathy. LUNGS AND PLEURA: No focal consolidation or pulmonary edema. No pleural effusion or pneumothorax. ABDOMEN AND PELVIS: LIVER: The patient is status post cholecystectomy. There is moderate pneumobilia.  GALLBLADDER AND BILE DUCTS: The patient is status post cholecystectomy. There is moderate pneumobilia again demonstrated. There is abnormal dilatation of the common bile duct which measures approximately 17 mm in diameter. SPLEEN: No acute abnormality. PANCREAS: No acute abnormality. ADRENAL GLANDS: No acute abnormality. KIDNEYS, URETERS AND BLADDER: There is a simple cyst arising from the left kidney, measuring 4.2 cm in diameter. Per consensus, no follow-up is needed for simple Bosniak type 1 and 2 renal cysts, unless the patient has a malignancy history or risk factors. No stones in the kidneys or ureters. No hydronephrosis. No perinephric or periureteral stranding. Urinary bladder is unremarkable. GI AND BOWEL: Stomach demonstrates no acute abnormality. There is no bowel obstruction. REPRODUCTIVE ORGANS: No acute abnormality. PERITONEUM AND RETROPERITONEUM: No ascites. No free air. VASCULATURE: The abdominal aorta demonstrates mild-to-moderate calcific atheromatous disease. ABDOMINAL AND PELVIS LYMPH NODES: No lymphadenopathy. REPRODUCTIVE ORGANS: No acute abnormality. BONES AND SOFT TISSUES: The patient is status post bilateral posterolateral spinal fusion of the thoracolumbar spine and upper sacrum. There is a dorsal column stimulator device present. No acute osseous abnormality. No focal soft tissue abnormality. IMPRESSION: 1. No evidence of acute traumatic injury. 2. Status post cholecystectomy with moderate pneumobilia and  abnormal dilatation of the common bile duct measuring approximately 17 mm in diameter. Electronically signed by: Evalene Coho MD 01/30/2024 12:52 PM EDT RP Workstation: [id number]   DG Lumbar Spine 2-3 Views Result Date: 01/30/2024 EXAM: 2 or 3 VIEW(S) XRAY OF THE LUMBAR SPINE 01/30/2024 11:38:22 AM COMPARISON: 08/18/2023 CLINICAL HISTORY: Fall. Abdominal pain, lower back pain. Pt has chronic pain and it got worse few days ago. Pt also had N/V. Pt states none currently. Pt does have  hx of kidney stones. Pt did fall prior to EMS arrival. Pt states back pain and knot to right lower back. Pt states he fell down 14 stairs. Pt states he has nerve damage and his legs gave out. FINDINGS: LUMBAR SPINE: BONES: Thoracolumbar spine fixation from T10 through the upper sacrum. No hardware complication identified. Chronic moderate L4 compression deformity. Similar trace L3-4 anterolisthesis. SOFT TISSUES: Dorsal spinal stimulator extends beyond the superior aspect of the exam. IMPRESSION: 1. Postsurgical and remote posttraumatic changes as detailed above. No superimposed acute process. Electronically signed by: Rockey Kilts MD 01/30/2024 12:03 PM EDT RP Workstation: [id number]   CT Head Wo Contrast Result Date: 01/30/2024 CLINICAL DATA:  Fall down stairs.  Back pain. EXAM: CT HEAD WITHOUT CONTRAST CT CERVICAL SPINE WITHOUT CONTRAST TECHNIQUE: Multidetector CT imaging of the head and cervical spine was performed following the standard protocol without intravenous contrast. Multiplanar CT image reconstructions of the cervical spine were also generated. RADIATION DOSE REDUCTION: This exam was performed according to the departmental dose-optimization program which includes automated exposure control, adjustment of the mA and/or kV according to patient size and/or use of iterative reconstruction technique. COMPARISON:  CT head 10/06/2022. CT cervical spine 08/18/2023. FINDINGS: CT HEAD FINDINGS Brain: There is no evidence of an acute infarct, intracranial hemorrhage, mass, midline shift, or extra-axial fluid collection. Cerebral volume is normal. The ventricles are normal in size. Vascular: No hyperdense vessel. Skull: No fracture or suspicious lesion. Sinuses/Orbits: Small mucous retention cyst in the left maxillary sinus. Clear mastoid air cells. Unremarkable orbits. Other: None. CT CERVICAL SPINE FINDINGS Alignment: Chronic straightening of the normal cervical lordosis. No listhesis. Skull base and vertebrae:  No acute fracture or suspicious lesion. Soft tissues and spinal canal: No prevertebral fluid or swelling. No visible canal hematoma. Disc levels: Disc degeneration predominantly in the lower cervical spine, greatest at C7-T1 where there are prominent degenerative endplate changes and severe disc space narrowing, likely with developing fusion across the disc space. Focally severe facet arthrosis at C3-4 on the left. No evidence of high-grade spinal canal stenosis. Upper chest: Biapical lung scarring. Other: None. IMPRESSION: 1. No evidence of acute intracranial abnormality. 2. No acute cervical spine fracture or traumatic malalignment. Electronically Signed   By: Dasie Hamburg M.D.   On: 01/30/2024 11:37   CT Cervical Spine Wo Contrast Result Date: 01/30/2024 CLINICAL DATA:  Fall down stairs.  Back pain. EXAM: CT HEAD WITHOUT CONTRAST CT CERVICAL SPINE WITHOUT CONTRAST TECHNIQUE: Multidetector CT imaging of the head and cervical spine was performed following the standard protocol without intravenous contrast. Multiplanar CT image reconstructions of the cervical spine were also generated. RADIATION DOSE REDUCTION: This exam was performed according to the departmental dose-optimization program which includes automated exposure control, adjustment of the mA and/or kV according to patient size and/or use of iterative reconstruction technique. COMPARISON:  CT head 10/06/2022. CT cervical spine 08/18/2023. FINDINGS: CT HEAD FINDINGS Brain: There is no evidence of an acute infarct, intracranial hemorrhage, mass, midline shift, or extra-axial fluid collection. Cerebral volume  is normal. The ventricles are normal in size. Vascular: No hyperdense vessel. Skull: No fracture or suspicious lesion. Sinuses/Orbits: Small mucous retention cyst in the left maxillary sinus. Clear mastoid air cells. Unremarkable orbits. Other: None. CT CERVICAL SPINE FINDINGS Alignment: Chronic straightening of the normal cervical lordosis. No  listhesis. Skull base and vertebrae: No acute fracture or suspicious lesion. Soft tissues and spinal canal: No prevertebral fluid or swelling. No visible canal hematoma. Disc levels: Disc degeneration predominantly in the lower cervical spine, greatest at C7-T1 where there are prominent degenerative endplate changes and severe disc space narrowing, likely with developing fusion across the disc space. Focally severe facet arthrosis at C3-4 on the left. No evidence of high-grade spinal canal stenosis. Upper chest: Biapical lung scarring. Other: None. IMPRESSION: 1. No evidence of acute intracranial abnormality. 2. No acute cervical spine fracture or traumatic malalignment. Electronically Signed   By: Dasie Hamburg M.D.   On: 01/30/2024 11:37    PROCEDURES:  Critical Care performed: No  Procedures   MEDICATIONS ORDERED IN ED: Medications  ondansetron  (ZOFRAN ) injection 4 mg (4 mg Intravenous Given 01/30/24 1152)  morphine  (PF) 4 MG/ML injection 4 mg (4 mg Intravenous Given 01/30/24 1152)  iohexol  (OMNIPAQUE ) 300 MG/ML solution 100 mL (100 mLs Intravenous Contrast Given 01/30/24 1215)  HYDROmorphone  (DILAUDID ) injection 0.5 mg (0.5 mg Intravenous Given 01/30/24 1319)     IMPRESSION / MDM / ASSESSMENT AND PLAN / ED COURSE  I reviewed the triage vital signs and the nursing notes.  Differential diagnosis includes, but is not limited to, intracranial bleed, skull fracture, spine fracture, rib fracture, pneumothorax, intra-abdominal trauma, recurrent biliary pathology, UTI, colitis, diverticulitis  Patient's presentation is most consistent with acute presentation with potential threat to life or bodily function.  68 year old male presenting to the emergency department for evaluation of abdominal pain and fall.  Stable vitals on presentation.  Labs with leukocytosis WC of 16, normal hemoglobin.  CMP without critical derangements.  Normal lipase.  Urine without evidence of infection.  Given age,  anticoagulation use, and significant mechanism, CT head, spine, chest abdomen pelvis with L-spine recons was ordered.  This fortunately did not demonstrate any significant traumatic injuries.  His CT of the abdomen did demonstrate moderate pneumobilia with abnormal dilation of the CBD at 17 mm.  However, patient's LFTs are normal.  With his abdominal pain, case was discussed with Dr. Maryruth.  He notes that since patient is status post cholecystectomy and with a recent ERCP would be highly unlikely for patient to have recurrent choledocholithiasis.  He has a low suspicion with patient's normal LFTs.  He notes that if there he is high clinical concern based on exam and MRCP would be reasonable, but otherwise does not feel that it is indicated at this time.  Patient was reassessed.  He reports some ongoing back pain and abdominal pain but his abdominal pain is primarily in his lower abdomen.  No significant tenderness of the right upper quadrant and epigastric region.  With this, low suspicion for recurrent choledocholithiasis.  Patient was updated on results of workup.  Low suspicion emergent process.  Patient is comfortable with discharge home.  Strict return precautions were provided.  Patient was discharged in stable condition.      FINAL CLINICAL IMPRESSION(S) / ED DIAGNOSES   Final diagnoses:  Generalized abdominal pain  Closed head injury, initial encounter  Bilateral low back pain without sciatica, unspecified chronicity  Fall down stairs, initial encounter     Rx / DC  Orders   ED Discharge Orders     None        Note:  This document was prepared using Dragon voice recognition software and may include unintentional dictation errors.   Levander Slate, MD 01/30/24 1324

## 2024-03-09 ENCOUNTER — Encounter: Payer: Self-pay | Admitting: Emergency Medicine

## 2024-03-09 ENCOUNTER — Other Ambulatory Visit: Payer: Self-pay

## 2024-03-09 ENCOUNTER — Emergency Department

## 2024-03-09 ENCOUNTER — Ambulatory Visit
Admission: RE | Admit: 2024-03-09 | Discharge: 2024-03-09 | Disposition: A | Discharge status: Home / Self Care | Source: Ambulatory Visit | Attending: General Practice | Admitting: General Practice

## 2024-03-09 ENCOUNTER — Emergency Department
Admission: EM | Admit: 2024-03-09 | Discharge: 2024-03-09 | Disposition: A | Discharge status: Home / Self Care | Source: Other Acute Inpatient Hospital | Attending: Emergency Medicine | Admitting: Emergency Medicine

## 2024-03-09 VITALS — BP 170/97 | HR 84 | Temp 99.3°F | Resp 16 | Wt 183.9 lb

## 2024-03-09 DIAGNOSIS — J209 Acute bronchitis, unspecified: Secondary | ICD-10-CM | POA: Insufficient documentation

## 2024-03-09 DIAGNOSIS — R109 Unspecified abdominal pain: Secondary | ICD-10-CM | POA: Diagnosis not present

## 2024-03-09 DIAGNOSIS — R1031 Right lower quadrant pain: Secondary | ICD-10-CM | POA: Insufficient documentation

## 2024-03-09 DIAGNOSIS — Z8551 Personal history of malignant neoplasm of bladder: Secondary | ICD-10-CM | POA: Diagnosis not present

## 2024-03-09 DIAGNOSIS — I129 Hypertensive chronic kidney disease with stage 1 through stage 4 chronic kidney disease, or unspecified chronic kidney disease: Secondary | ICD-10-CM | POA: Diagnosis not present

## 2024-03-09 DIAGNOSIS — Z8679 Personal history of other diseases of the circulatory system: Secondary | ICD-10-CM | POA: Diagnosis not present

## 2024-03-09 DIAGNOSIS — D72829 Elevated white blood cell count, unspecified: Secondary | ICD-10-CM | POA: Insufficient documentation

## 2024-03-09 DIAGNOSIS — R1013 Epigastric pain: Secondary | ICD-10-CM | POA: Diagnosis not present

## 2024-03-09 DIAGNOSIS — N189 Chronic kidney disease, unspecified: Secondary | ICD-10-CM | POA: Diagnosis not present

## 2024-03-09 DIAGNOSIS — R059 Cough, unspecified: Secondary | ICD-10-CM | POA: Diagnosis present

## 2024-03-09 LAB — COMPREHENSIVE METABOLIC PANEL WITH GFR
ALT: 16 U/L (ref 0–44)
AST: 21 U/L (ref 15–41)
Albumin: 4.4 g/dL (ref 3.5–5.0)
Alkaline Phosphatase: 91 U/L (ref 38–126)
Anion gap: 13 (ref 5–15)
BUN: 13 mg/dL (ref 8–23)
CO2: 25 mmol/L (ref 22–32)
Calcium: 9.7 mg/dL (ref 8.9–10.3)
Chloride: 102 mmol/L (ref 98–111)
Creatinine, Ser: 0.85 mg/dL (ref 0.61–1.24)
GFR, Estimated: >60 mL/min (ref >60)
Glucose, Bld: 124 mg/dL — ABNORMAL HIGH (ref 70–99)
Potassium: 3.8 mmol/L (ref 3.5–5.1)
Sodium: 140 mmol/L (ref 135–145)
Total Bilirubin: 1.2 mg/dL (ref 0.0–1.2)
Total Protein: 8.5 g/dL — ABNORMAL HIGH (ref 6.5–8.1)

## 2024-03-09 LAB — LIPASE, BLOOD: Lipase: 29 U/L (ref 11–51)

## 2024-03-09 LAB — CBC
HCT: 45.3 % (ref 39.0–52.0)
Hemoglobin: 15.2 g/dL (ref 13.0–17.0)
MCH: 30.3 pg (ref 26.0–34.0)
MCHC: 33.6 g/dL (ref 30.0–36.0)
MCV: 90.4 fL (ref 80.0–100.0)
Platelets: 291 K/uL (ref 150–400)
RBC: 5.01 MIL/uL (ref 4.22–5.81)
RDW: 18.1 % — ABNORMAL HIGH (ref 11.5–15.5)
WBC: 15.8 K/uL — ABNORMAL HIGH (ref 4.0–10.5)
nRBC: 0 % (ref 0.0–0.2)

## 2024-03-09 MED ORDER — DOXYCYCLINE HYCLATE 100 MG PO TABS: 100.0000 mg | ORAL_TABLET | Freq: Two times a day (BID) | ORAL | 0 refills | Status: DC

## 2024-03-09 MED ORDER — OXYCODONE-ACETAMINOPHEN 5-325 MG PO TABS
1.0000 | ORAL_TABLET | Freq: Once | ORAL | Status: AC
  Administered 2024-03-09: 1 via ORAL
  Filled 2024-03-09: qty 1

## 2024-03-09 MED ORDER — SODIUM CHLORIDE 0.9 % IV BOLUS
1000.0000 mL | Freq: Once | INTRAVENOUS | Status: AC
  Administered 2024-03-09: 1000 mL via INTRAVENOUS

## 2024-03-09 MED ORDER — ONDANSETRON HCL 4 MG/2ML IJ SOLN
4.0000 mg | Freq: Once | INTRAMUSCULAR | Status: AC
  Administered 2024-03-09: 4 mg via INTRAVENOUS
  Filled 2024-03-09: qty 2

## 2024-03-09 MED ORDER — HYDROMORPHONE HCL 1 MG/ML IJ SOLN
1.0000 mg | Freq: Once | INTRAMUSCULAR | Status: AC
  Administered 2024-03-09: 1 mg via INTRAVENOUS
  Filled 2024-03-09: qty 1

## 2024-03-09 MED ORDER — IOHEXOL 350 MG/ML SOLN
75.0000 mL | Freq: Once | INTRAVENOUS | Status: AC | PRN
  Administered 2024-03-09: 75 mL via INTRAVENOUS

## 2024-03-09 NOTE — ED Notes (Signed)
 Patient is being discharged from the Urgent Care and sent to the Emergency Department via personal vehicle. Per NP Defelice, patient is in need of higher level of care due to abdominal pain. Patient is aware and verbalizes understanding of plan of care.  Vitals:   03/09/24 1346  BP: (!) 170/97  Pulse: 84  Resp: 16  Temp: 99.3 F (37.4 C)  SpO2: 97%

## 2024-03-09 NOTE — Discharge Instructions (Signed)
 Go to the Er for further evaluation of severe sharp abdominal pain Do not eat or drink anything until evaluated by ER provider.

## 2024-03-09 NOTE — ED Provider Notes (Signed)
 [facility]    CSN: [id number] Arrival date & time: 03/09/24  1333      History   Chief Complaint Chief Complaint  Patient presents with   Cough   Abdominal Pain   Diarrhea    HPI Earl Murray is a 68 y.o. male.   68 year old male patient, Earl Murray, presents to urgent care for evaluation of 8/10 abdominal pain that is sharp and constant, unsure of when started. Pt c/o had a cold x 2 weeks, it's in my belly now with cough and diarrhea. Pt reports poor po intake, loss of appetite. No known illness exposure.Pt did not take BP meds today. Pt denies chest pain,palpitations or SOB. Smokes cigarettes daily.  PMH: CKD, Bladder cancer,Hep C, pancreatitis,HTN, hyperlipidemia,PE on Eliquis , prior choledocholithiasis, polysubstance abuse,smoker    The history is provided by the patient. No language interpreter was used.    Past Medical History:  Diagnosis Date   Bladder cancer (HCC)    Choledocholithiasis    Chronic kidney disease    Cocaine dependence in remission (HCC)    Concussion    Depression    DVT (deep venous thrombosis) (HCC)    GERD (gastroesophageal reflux disease)    Hepatitis C    History of cancer    Hyperlipidemia    Hypertension    Migraine    Neck injury    Opioid dependence in remission [facility])    Pancreatitis    Prediabetes    Scoliosis    Sleep apnea     Patient Active Problem List   Diagnosis Date Noted   History of hypertension 03/09/2024   Epigastric abdominal pain 09/18/2023   Nausea and vomiting 08/27/2023   Transaminitis 08/26/2023   Chest pain 08/25/2023   Elevated lactic acid level 08/25/2023   Precordial chest pain 08/19/2023   Frequent falls 08/19/2023   Polyneuropathy 08/19/2023   Elevated LFTs 08/19/2023   History of cholecystectomy 08/19/2023   History of hepatitis C 08/19/2023   History of fusion of lumbar spine 08/19/2023   Long term (current) use of anticoagulants 02/16/2023   Personal history of pulmonary  embolism 02/16/2023   Pain of lower extremity    Pulmonary embolism (HCC)    Dyslipidemia 10/13/2021   GERD without esophagitis 10/13/2021   Hypertensive urgency 10/13/2021   Acute pulmonary embolism (HCC) 10/12/2021   Allergic rhinitis 04/25/2021   Bladder cancer (HCC) 04/25/2021   Closed fracture of metatarsal bone 04/25/2021   Continuous severe abdominal pain 04/25/2021   Gross hematuria 04/25/2021   Lateral epicondylitis of elbow 04/25/2021   Major depressive disorder, recurrent, unspecified 04/25/2021   Knee pain 04/25/2021   Migraine 04/25/2021   Cervicalgia 04/25/2021   Neuroma of foot 04/25/2021   Opioid dependence in remission (HCC) 04/25/2021   Encounter for screening for malignant neoplasm of respiratory organs 04/25/2021   Pain, unspecified 04/25/2021   Pancreatitis 04/25/2021   Paresthesia of skin 04/25/2021   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   Sciatica 04/25/2021   Syncope and collapse 04/25/2021   Tobacco use 04/25/2021   Diarrhea, unspecified 03/25/2021   Degeneration of lumbosacral intervertebral disc 03/25/2021   DDD (degenerative disc disease), cervical 03/25/2021   Chronic sinusitis 03/25/2021   Family history of malignant neoplasm of digestive organs 03/25/2021   Gallbladder disease 03/25/2021   DVT (deep venous thrombosis) (HCC) 03/25/2021   Prediabetes 02/05/2021   Stage 3a chronic kidney disease (HCC) 02/05/2021   Cocaine dependence in remission (HCC) 01/31/2021   Hyperlipidemia 01/31/2021  Obstructive sleep apnea 01/31/2021   Other and unspecified alcohol dependence, in remission 01/31/2021   Other, mixed, or unspecified nondependent drug abuse, unspecified 01/31/2021   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/2021   Scoliosis of thoracic spine 01/31/2021   Abnormal magnetic resonance imaging of liver    Choledocholithiasis 10/25/2019   Hypertension    Lumbar radiculopathy 09/15/2018   Displacement of lumbar intervertebral disc without myelopathy  09/12/2018   Cervical radiculitis 09/12/2018   Hallux valgus, acquired 04/20/2016   Hammer toes of both feet 04/20/2016   Stress reaction 04/20/2016   Chronic back pain 05/25/1998   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    Past Surgical History:  Procedure Laterality Date   BACK SURGERY     CHOLECYSTECTOMY     ELBOW SURGERY     ERCP N/A 10/27/2019   Procedure: ENDOSCOPIC RETROGRADE CHOLANGIOPANCREATOGRAPHY (ERCP);  Surgeon: [name], MD;  Location: [facility] ENDOSCOPY;  Service: Endoscopy;  Laterality: N/A;   ERCP N/A 08/26/2023   Procedure: ERCP, WITH INTERVENTION IF INDICATED;  Surgeon: [name], MD;  Location: [facility] ENDOSCOPY;  Service: Endoscopy;  Laterality: N/A;   ERCP N/A 09/17/2023   Procedure: ERCP, WITH INTERVENTION IF INDICATED;  Surgeon: [name], MD;  Location: [facility] ENDOSCOPY;  Service: Endoscopy;  Laterality: N/A;   FOOT SURGERY     HERNIA REPAIR     4 hernia repairs   PULMONARY THROMBECTOMY N/A 10/14/2021   Procedure: PULMONARY THROMBECTOMY;  Surgeon: [name], MD;  Location: [facility] INVASIVE CV LAB;  Service: Cardiovascular;  Laterality: N/A;       Home Medications    Prior to Admission medications   Medication Sig Start Date End Date Taking? Authorizing Provider  amLODipine  (NORVASC ) 10 MG tablet Take 10 mg by mouth daily.   Yes [provider]  apixaban  (ELIQUIS ) 5 MG TABS tablet Take 5 mg by mouth 2 (two) times daily. 12/08/22  Yes [provider]  atorvastatin  (LIPITOR) 40 MG tablet Take 20 mg by mouth every evening. 08/23/23  Yes [provider]  chlorthalidone  (HYGROTON ) 25 MG tablet Take 25 mg by mouth daily. 08/23/23  Yes [provider]  HYDROPHILIC EX Apply 1 Application topically daily as needed (dry skin). 06/08/23  Yes [provider]  hydrOXYzine  (ATARAX ) 25 MG tablet Take 25 mg by mouth 2 (two) times daily as needed for anxiety. 08/23/23  Yes [provider]  oxyCODONE -acetaminophen  (PERCOCET) 5-325  MG tablet Take 1 tablet by mouth every 8 (eight) hours as needed for severe pain (pain score 7-10). 09/18/23 09/17/24 Yes Patel, Sona, MD  tiZANidine  (ZANAFLEX ) 4 MG tablet Take 2 tablets (8 mg total) by mouth daily as needed for muscle spasms. 08/20/23  Yes Wieting, Richard, MD  DULoxetine  (CYMBALTA ) 30 MG capsule Take 30 mg by mouth daily.    [provider]  losartan  (COZAAR ) 100 MG tablet Take 100 mg by mouth daily. 08/19/23 11/17/23  [provider]  MENTHOL-METHYL SALICYLATE EX Apply 1 Application topically 4 (four) times daily as needed (pain relief). 06/08/23   [provider]  naloxone [facility]) nasal spray 4 mg/0.1 mL Place 1 spray into the nose once as needed (opiod overdose).    [provider]  PARoxetine  (PAXIL ) 40 MG tablet Take 20 mg by mouth every morning. 08/23/23   [provider]  gabapentin  (NEURONTIN ) 300 MG capsule gabapentin  300 mg capsule  one tab bid-tid  03/01/19  [provider]  ipratropium (ATROVENT ) 0.06 % nasal spray Place 2 sprays into both nostrils  4 (four) times daily as needed for rhinitis. 10/22/19 03/16/20  Gray, Bryan E, NP  levocetirizine (XYZAL ) 5 MG tablet Take 1 tablet (5 mg total) by mouth every evening. 09/30/19 03/16/20  Cook, Jayce G, DO    Family History Family History  Problem Relation Age of Onset   Hypertension Mother    Heart disease Mother    Colon cancer Father     Social History Social History   Tobacco Use   Smoking status: Some Days    Current packs/day: 0.00    Types: Cigarettes    Last attempt to quit: 05/2018    Years since quitting: 5.7   Smokeless tobacco: Never  Vaping Use   Vaping status: Never Used  Substance Use Topics   Alcohol use: Yes    Comment: occ   Drug use: Not Currently     Allergies   Gabapentin , Toradol  [ketorolac  tromethamine ], Motrin [ibuprofen], Tramadol, and Tylenol  with codeine #3 [acetaminophen -codeine]   Review of Systems Review of Systems   Constitutional:  Positive for chills.  HENT:  Positive for congestion.   Respiratory:  Positive for cough. Negative for shortness of breath, wheezing and stridor.   Cardiovascular:  Negative for chest pain and palpitations.  Gastrointestinal:  Positive for abdominal pain and diarrhea. Negative for nausea and vomiting.  Genitourinary:  Negative for dysuria.  Neurological:  Positive for headaches.  All other systems reviewed and are negative.    Physical Exam Triage Vital Signs ED Triage Vitals  Encounter Vitals Group     BP 03/09/24 1346 (!) 170/97     Girls Systolic BP Percentile --      Girls Diastolic BP Percentile --      Boys Systolic BP Percentile --      Boys Diastolic BP Percentile --      Pulse Rate 03/09/24 1346 84     Resp 03/09/24 1346 16     Temp 03/09/24 1346 99.3 F (37.4 C)     Temp Source 03/09/24 1346 Oral     SpO2 03/09/24 1346 97 %     Weight 03/09/24 1344 183 lb 14.4 oz (83.4 kg)     Height --      Head Circumference --      Peak Flow --      Pain Score 03/09/24 1344 8     Pain Loc --      Pain Education --      Exclude from Growth Chart --    No data found.  Updated Vital Signs BP (!) 170/97 (BP Location: Left Arm)   Pulse 84   Temp 99.3 F (37.4 C) (Oral)   Resp 16   Wt 183 lb 14.4 oz (83.4 kg)   SpO2 97%   BMI 21.81 kg/m   Visual Acuity Right Eye Distance:   Left Eye Distance:   Bilateral Distance:    Right Eye Near:   Left Eye Near:    Bilateral Near:     Physical Exam Vitals and nursing note reviewed.  Constitutional:      General: He is not in acute distress.    Appearance: He is well-developed.  HENT:     Head: Normocephalic and atraumatic.     Right Ear: Tympanic membrane is retracted.     Left Ear: Tympanic membrane is retracted.     Nose: Nose normal.     Mouth/Throat:     Lips: Pink.     Mouth: Mucous membranes are moist.  Pharynx: Oropharynx is clear.  Eyes:     Conjunctiva/sclera: Conjunctivae normal.   Cardiovascular:     Rate and Rhythm: Normal rate and regular rhythm.     Heart sounds: Normal heart sounds. No murmur heard. Pulmonary:     Effort: Pulmonary effort is normal. No respiratory distress.     Breath sounds: Normal breath sounds and air entry.  Abdominal:     General: Bowel sounds are normal.     Palpations: Abdomen is soft.     Tenderness: There is abdominal tenderness in the periumbilical area. There is no guarding or rebound.  Musculoskeletal:        General: No swelling.     Cervical back: Neck supple.  Skin:    General: Skin is warm and dry.     Capillary Refill: Capillary refill takes less than 2 seconds.  Neurological:     General: No focal deficit present.     Mental Status: He is alert and oriented to person, place, and time.     GCS: GCS eye subscore is 4. GCS verbal subscore is 5. GCS motor subscore is 6.  Psychiatric:        Attention and Perception: Attention normal.        Mood and Affect: Mood normal.        Speech: Speech normal.        Behavior: Behavior normal. Behavior is cooperative.      UC Treatments / Results  Labs (all labs ordered are listed, but only abnormal results are displayed) Labs Reviewed - No data to display  EKG   Radiology No results found.  Procedures Procedures (including critical care time)  Medications Ordered in UC Medications - No data to display  Initial Impression / Assessment and Plan / UC Course  I have reviewed the triage vital signs and the nursing notes.  Pertinent labs & imaging results that were available during my care of the patient were reviewed by me and considered in my medical decision making (see chart for details).    Discussed exam findings and plan of care with pt, pt takes Oxycontin  10mg  IR tid, discussed concerns with pt that with medical hx(cancer,Hep C, pancreatitis,HTN, PE on Eliquis , prior choledocholithiasis and opiod pain med with severe abdominal pain needs to be evaluated at higher  level of care for labs, monitoring, imaging). Pt GCS 15, wants to go POV to ER, has driver.   Ddx: Severe abdominal pain, pancreatitis, colitis, diverticulitis, recurrent biliary pathology, aneurysm, pneumonia, UTI Final Clinical Impressions(s) / UC Diagnoses   Final diagnoses:  Continuous severe abdominal pain  History of hypertension     Discharge Instructions      Go to the Er for further evaluation of severe sharp abdominal pain Do not eat or drink anything until evaluated by ER provider.     ED Prescriptions   None    PDMP not reviewed this encounter.   Aminta Loose, NP 03/09/24 1445

## 2024-03-09 NOTE — ED Triage Notes (Signed)
 C/O cold and productive cough, headache, and diarrhea x 2 weeks. Referred to ED from [facility]. C/O mid abd pain above navel. States pain is sharp.  AAOx3. Skin warm and dry. NAD. Posture upright and relaxed. Gait steady. NAD

## 2024-03-09 NOTE — ED Notes (Signed)
 See triage note  Presents with some cough,headache and body aches  Sxs; started about 2 weeks ago   Also having some n/v/d  Abd is tender on palpation

## 2024-03-09 NOTE — Discharge Instructions (Signed)
 Follow-up with your regular doctor if not improving in 2 to 3 days.  Return emergency department if worsening.  Take your medication as prescribed. Your labs and CT are all reassuring

## 2024-03-09 NOTE — ED Provider Notes (Signed)
 [facility] Provider Note    Event Date/Time   First MD Initiated Contact with Patient 03/09/24 1630     (approximate)   History   No chief complaint on file.   HPI  Earl Murray is a 68 y.o. male history hypertension, pancreatitis, hep C, bladder cancer, opioid dependence, CKD presents emergency department complaining of cough and congestion along with abdominal pain.  Patient states went to urgent care thinking he just need an antibiotic.  When she was pressing on his abdomen she noted that he was very tender and sent him to the ED.  Patient states he had benign polyps noted on his last colonoscopy.      Physical Exam   Triage Vital Signs: ED Triage Vitals  Encounter Vitals Group     BP 03/09/24 1504 (!) 166/102     Girls Systolic BP Percentile --      Girls Diastolic BP Percentile --      Boys Systolic BP Percentile --      Boys Diastolic BP Percentile --      Pulse Rate 03/09/24 1504 97     Resp 03/09/24 1504 16     Temp 03/09/24 1504 98.7 F (37.1 C)     Temp Source 03/09/24 1504 Oral     SpO2 03/09/24 1504 97 %     Weight 03/09/24 1505 183 lb 13.8 oz (83.4 kg)     Height 03/09/24 1629 6' 5 (1.956 m)     Head Circumference --      Peak Flow --      Pain Score 03/09/24 1504 8     Pain Loc --      Pain Education --      Exclude from Growth Chart --     Most recent vital signs: Vitals:   03/09/24 1504  BP: (!) 166/102  Pulse: 97  Resp: 16  Temp: 98.7 F (37.1 C)  SpO2: 97%     General: Awake, no distress.   CV:  Good peripheral perfusion. Resp:  Normal effort. Lungs cta Abd:  No distention.  Tender in the right lower quadrant, some tenderness in the left and epigastric area, no right upper quadrant tenderness Other:      ED Results / Procedures / Treatments   Labs (all labs ordered are listed, but only abnormal results are displayed) Labs Reviewed  COMPREHENSIVE METABOLIC PANEL WITH GFR - Abnormal; Notable for the  following components:      Result Value   Glucose, Bld 124 (*)    Total Protein 8.5 (*)    All other components within normal limits  CBC - Abnormal; Notable for the following components:   WBC 15.8 (*)    RDW 18.1 (*)    All other components within normal limits  LIPASE, BLOOD  URINALYSIS, ROUTINE W REFLEX MICROSCOPIC     EKG     RADIOLOGY Chest x-ray, CT abdomen pelvis IV contrast    PROCEDURES:   Procedures  Critical Care:  no No chief complaint on file.     MEDICATIONS ORDERED IN ED: Medications  HYDROmorphone  (DILAUDID ) injection 1 mg (1 mg Intravenous Given 03/09/24 1725)  ondansetron  (ZOFRAN ) injection 4 mg (4 mg Intravenous Given 03/09/24 1725)  sodium chloride  0.9 % bolus 1,000 mL (1,000 mLs Intravenous New Bag/Given 03/09/24 1725)  iohexol  (OMNIPAQUE ) 350 MG/ML injection 75 mL (75 mLs Intravenous Contrast Given 03/09/24 1735)  oxyCODONE -acetaminophen  (PERCOCET/ROXICET) 5-325 MG per tablet 1 tablet (1 tablet Oral Given  03/09/24 1846)     IMPRESSION / MDM / ASSESSMENT AND PLAN / ED COURSE  I reviewed the triage vital signs and the nursing notes.                              Differential diagnosis includes, but is not limited to, CAP, URI, pancreatitis, choledocholithiasis, bowel obstruction, metastatic disease, acute appendicitis, diverticulitis, recurrent bladder cancer  Patient's presentation is most consistent with acute illness / injury with system symptoms.   Medications given: Dilaudid  1 mg IV, Zofran  4 mg IV, normal saline chemotherapy  CBC with elevated WBC of 15.8, other labs reassuring   CT abdomen pelvis IV contrast independently reviewed interpreted by me as being negative for any acute abnormality  I did explain these findings to the patient.  He continues to have the weekly abdominal pain that he has been having he should follow-up with his regular doctor and have a repeat colonoscopy.  Due to the cough for more than a week I do feel  he has more of a bronchitis, we will go ahead and give him an antibiotic since he is coughing up green mucus.  However if he begins to run fever, is feeling worse he should return emergency department.  He is in agreement treatment plan.  Discharged stable condition.   FINAL CLINICAL IMPRESSION(S) / ED DIAGNOSES   Final diagnoses:  Acute bronchitis, unspecified organism     Rx / DC Orders   ED Discharge Orders          Ordered    doxycycline (VIBRA-TABS) 100 MG tablet  2 times daily        03/09/24 1831             Note:  This document was prepared using Dragon voice recognition software and may include unintentional dictation errors.    Gasper Devere ORN, PA-C 03/09/24 1850    Waymond Lorelle Cummins, MD 03/09/24 [phone]

## 2024-03-09 NOTE — ED Triage Notes (Signed)
 Pt states that he has a cold and has had a productive cough, headache, diarrhea x2weeks  Pt states that he is not sure when the abdominal pain started, but it has not gone away and is around the navel and is a sharp and constant pain.

## 2024-03-12 ENCOUNTER — Emergency Department
Admission: EM | Admit: 2024-03-12 | Discharge: 2024-03-12 | Disposition: A | Discharge status: Home / Self Care | Attending: Emergency Medicine | Admitting: Emergency Medicine

## 2024-03-12 ENCOUNTER — Other Ambulatory Visit: Payer: Self-pay

## 2024-03-12 ENCOUNTER — Emergency Department

## 2024-03-12 DIAGNOSIS — Z7901 Long term (current) use of anticoagulants: Secondary | ICD-10-CM | POA: Diagnosis not present

## 2024-03-12 DIAGNOSIS — I129 Hypertensive chronic kidney disease with stage 1 through stage 4 chronic kidney disease, or unspecified chronic kidney disease: Secondary | ICD-10-CM | POA: Diagnosis not present

## 2024-03-12 DIAGNOSIS — D72829 Elevated white blood cell count, unspecified: Secondary | ICD-10-CM | POA: Diagnosis not present

## 2024-03-12 DIAGNOSIS — J189 Pneumonia, unspecified organism: Secondary | ICD-10-CM | POA: Insufficient documentation

## 2024-03-12 DIAGNOSIS — R0789 Other chest pain: Secondary | ICD-10-CM | POA: Insufficient documentation

## 2024-03-12 DIAGNOSIS — N189 Chronic kidney disease, unspecified: Secondary | ICD-10-CM | POA: Insufficient documentation

## 2024-03-12 DIAGNOSIS — I82409 Acute embolism and thrombosis of unspecified deep veins of unspecified lower extremity: Secondary | ICD-10-CM | POA: Diagnosis not present

## 2024-03-12 DIAGNOSIS — R059 Cough, unspecified: Secondary | ICD-10-CM

## 2024-03-12 LAB — CBC WITH DIFFERENTIAL/PLATELET
Abs Immature Granulocytes: 0.04 K/uL (ref 0.00–0.07)
Basophils Absolute: 0.1 K/uL (ref 0.0–0.1)
Basophils Relative: 0 %
Eosinophils Absolute: 0 K/uL (ref 0.0–0.5)
Eosinophils Relative: 0 %
HCT: 42.4 % (ref 39.0–52.0)
Hemoglobin: 13.9 g/dL (ref 13.0–17.0)
Immature Granulocytes: 0 %
Lymphocytes Relative: 13 %
Lymphs Abs: 1.5 K/uL (ref 0.7–4.0)
MCH: 30.3 pg (ref 26.0–34.0)
MCHC: 32.8 g/dL (ref 30.0–36.0)
MCV: 92.4 fL (ref 80.0–100.0)
Monocytes Absolute: 0.2 K/uL (ref 0.1–1.0)
Monocytes Relative: 1 %
Neutro Abs: 9.5 K/uL — ABNORMAL HIGH (ref 1.7–7.7)
Neutrophils Relative %: 86 %
Platelets: 262 K/uL (ref 150–400)
RBC: 4.59 MIL/uL (ref 4.22–5.81)
RDW: 17.7 % — ABNORMAL HIGH (ref 11.5–15.5)
WBC: 11.2 K/uL — ABNORMAL HIGH (ref 4.0–10.5)
nRBC: 0 % (ref 0.0–0.2)

## 2024-03-12 LAB — COMPREHENSIVE METABOLIC PANEL WITH GFR
ALT: 15 U/L (ref 0–44)
AST: 21 U/L (ref 15–41)
Albumin: 4.4 g/dL (ref 3.5–5.0)
Alkaline Phosphatase: 86 U/L (ref 38–126)
Anion gap: 12 (ref 5–15)
BUN: 17 mg/dL (ref 8–23)
CO2: 23 mmol/L (ref 22–32)
Calcium: 9.6 mg/dL (ref 8.9–10.3)
Chloride: 107 mmol/L (ref 98–111)
Creatinine, Ser: 1.06 mg/dL (ref 0.61–1.24)
GFR, Estimated: >60 mL/min (ref >60)
Glucose, Bld: 140 mg/dL — ABNORMAL HIGH (ref 70–99)
Potassium: 3.9 mmol/L (ref 3.5–5.1)
Sodium: 142 mmol/L (ref 135–145)
Total Bilirubin: 0.7 mg/dL (ref 0.0–1.2)
Total Protein: 8.3 g/dL — ABNORMAL HIGH (ref 6.5–8.1)

## 2024-03-12 LAB — TROPONIN I (HIGH SENSITIVITY)
Troponin I (High Sensitivity): 10 ng/L (ref <18)
Troponin I (High Sensitivity): 9 ng/L (ref <18)

## 2024-03-12 LAB — RESP PANEL BY RT-PCR (RSV, FLU A&B, COVID)  RVPGX2
Influenza A by PCR: NEGATIVE
Influenza B by PCR: NEGATIVE
Resp Syncytial Virus by PCR: NEGATIVE
SARS Coronavirus 2 by RT PCR: NEGATIVE

## 2024-03-12 MED ORDER — IOHEXOL 350 MG/ML SOLN
75.0000 mL | Freq: Once | INTRAVENOUS | Status: AC | PRN
  Administered 2024-03-12: 75 mL via INTRAVENOUS

## 2024-03-12 MED ORDER — LIDOCAINE 5 % EX PTCH
1.0000 | MEDICATED_PATCH | CUTANEOUS | Status: DC
  Administered 2024-03-12: 1 via TRANSDERMAL
  Filled 2024-03-12: qty 1

## 2024-03-12 MED ORDER — OXYCODONE HCL 5 MG PO TABS: 5.0000 mg | ORAL_TABLET | Freq: Once | ORAL | Status: DC

## 2024-03-12 MED ORDER — LIDOCAINE 5 % EX PTCH: 1.0000 | MEDICATED_PATCH | CUTANEOUS | 0 refills | Status: AC

## 2024-03-12 MED ORDER — DOXYCYCLINE HYCLATE 100 MG PO TABS: 100.0000 mg | ORAL_TABLET | Freq: Two times a day (BID) | ORAL | 0 refills | Status: AC

## 2024-03-12 MED ORDER — HYDROMORPHONE HCL 1 MG/ML IJ SOLN
0.5000 mg | Freq: Once | INTRAMUSCULAR | Status: AC
  Administered 2024-03-12: 0.5 mg via INTRAVENOUS
  Filled 2024-03-12: qty 0.5

## 2024-03-12 MED ORDER — ACETAMINOPHEN 500 MG PO TABS
1000.0000 mg | ORAL_TABLET | Freq: Once | ORAL | Status: AC
  Administered 2024-03-12: 1000 mg via ORAL
  Filled 2024-03-12: qty 2

## 2024-03-12 MED ORDER — OXYCODONE HCL 5 MG PO TABS
5.0000 mg | ORAL_TABLET | Freq: Once | ORAL | Status: AC
  Administered 2024-03-12: 5 mg via ORAL
  Filled 2024-03-12: qty 1

## 2024-03-12 MED ORDER — SODIUM CHLORIDE 0.9 % IV SOLN
100.0000 mg | Freq: Once | INTRAVENOUS | Status: DC
  Filled 2024-03-12: qty 100

## 2024-03-12 MED ORDER — DOXYCYCLINE HYCLATE 100 MG PO TABS
100.0000 mg | ORAL_TABLET | Freq: Once | ORAL | Status: AC
  Administered 2024-03-12: 100 mg via ORAL
  Filled 2024-03-12: qty 1

## 2024-03-12 NOTE — Discharge Instructions (Addendum)
 Please take antibiotics as prescribed for your atypical pneumonia.  Please be sure to follow-up with primary care doctor next week to get reassessed.

## 2024-03-12 NOTE — ED Triage Notes (Signed)
 Pt arrives via [facility] from home with C/O left sided chest pain 9/10.

## 2024-03-12 NOTE — ED Provider Notes (Signed)
 [facility] Provider Note    Event Date/Time   First MD Initiated Contact with Patient 03/12/24 1949     (approximate)   History   Chest Pain   HPI  Kenny Rea is a 68 y.o. male with history of DVT on Eliquis , CKD, GERD, hypertension, hyperlipidemia, history of cocaine and opiate dependence in remission, presenting with left-sided chest pain.  States that started around 2:00 today.  Worse when he takes deep breath, is reproducible, associated with productive cough with green sputum.  No fevers at home, no leg swelling.  No recent trauma or falls.  Patient states that he has been compliant with his  Per independent history from EMS, blood pressures were in the 150s.  On independent chart review, he was seen 3 days ago for cough congestion as well as abdominal pain.  CT abdomen pelvis showed lung bases are clear, no acute findings.   Physical Exam   Triage Vital Signs: ED Triage Vitals  Encounter Vitals Group     BP      Girls Systolic BP Percentile      Girls Diastolic BP Percentile      Boys Systolic BP Percentile      Boys Diastolic BP Percentile      Pulse      Resp      Temp      Temp src      SpO2      Weight      Height      Head Circumference      Peak Flow      Pain Score      Pain Loc      Pain Education      Exclude from Growth Chart     Most recent vital signs: Vitals:   03/12/24 1955 03/12/24 2005  BP: (!) 143/83   Pulse: 75   Resp: 19   Temp:  98.3 F (36.8 C)  SpO2: 100%      General: Awake, no distress.  CV:  Good peripheral perfusion.  Resp:  Normal effort.  Left anterior chest is tender on palpation Abd:  No distention.  Soft nontender Other:  No unilateral counseling or tenderness, no lower extremity edema   ED Results / Procedures / Treatments   Labs (all labs ordered are listed, but only abnormal results are displayed) Labs Reviewed  COMPREHENSIVE METABOLIC PANEL WITH GFR - Abnormal; Notable for the  following components:      Result Value   Glucose, Bld 140 (*)    Total Protein 8.3 (*)    All other components within normal limits  CBC WITH DIFFERENTIAL/PLATELET - Abnormal; Notable for the following components:   WBC 11.2 (*)    RDW 17.7 (*)    Neutro Abs 9.5 (*)    All other components within normal limits  RESP PANEL BY RT-PCR (RSV, FLU A&B, COVID)  [id number]  TROPONIN I (HIGH SENSITIVITY)  TROPONIN I (HIGH SENSITIVITY)     EKG  EKG shows, sinus rhythm  75, normal QS, normal QTc, T wave flattening in 1, aVL, no obvious ischemic ST elevation, not significantly changed compared to prior   RADIOLOGY On my independent interpretation, chest x-ray without obvious consolidation   PROCEDURES:  Critical Care performed: No  Procedures   MEDICATIONS ORDERED IN ED: Medications  lidocaine  (LIDODERM ) 5 % 1 patch (1 patch Transdermal Patch Applied 03/12/24 2004)  acetaminophen  (TYLENOL ) tablet 1,000 mg (1,000 mg Oral Given 03/12/24  2004)  oxyCODONE  (Oxy IR/ROXICODONE ) immediate release tablet 5 mg (5 mg Oral Given 03/12/24 2112)  iohexol  (OMNIPAQUE ) 350 MG/ML injection 75 mL (75 mLs Intravenous Contrast Given 03/12/24 2126)  HYDROmorphone  (DILAUDID ) injection 0.5 mg (0.5 mg Intravenous Given 03/12/24 2201)  doxycycline (VIBRA-TABS) tablet 100 mg (100 mg Oral Given 03/12/24 2226)     IMPRESSION / MDM / ASSESSMENT AND PLAN / ED COURSE  I reviewed the triage vital signs and the nursing notes.                              Differential diagnosis includes, but is not limited to, musculoskeletal pain, strain, costochondritis, viral illness, pneumonia, angina, ACS.  Did consider PE but patient states that he is compliant with his anticoagulation has not missed any doses.  Does not appear volume overloaded to suggest CHF.  Will start with chest x-ray, labs, viral panel, Tylenol  and Lidoderm  patch for his chest wall pain.  No obvious findings on chest x-ray, will proceed with CT PE  study.  Patient's presentation is most consistent with acute presentation with potential threat to life or bodily function.  Independent interpretation of labs and imaging below.  On reassessment chest pain is improving, suspect might be secondary to atypical pneumonia, musculoskeletal pain given reproducible nature.  Will start him on doxycycline given his productive cough.  First dose given emergency department.  Prescription sent to his pharmacy.  Considered but no indication for inpatient mission at this time, he safe for outpatient management.  Discharged with strict return precautions.  Also sent Lidoderm  patches to his pharmacy.  Patient states that he has oxycodone  at home.  Will have him follow-up primary care next week to reassess.  Discharge.  The patient is on the cardiac monitor to evaluate for evidence of arrhythmia and/or significant heart rate changes.   Clinical Course as of 03/12/24 2247  Sun Mar 12, 2024  2050 Independent review of labs, electrolytes really deranged, mild leukocytosis noted, LFTs are normal, troponin is negative. [TT]  2113 Resp panel by RT-PCR (RSV, Flu A&B, Covid) Anterior Nasal Swab Negative [TT]  2113 DG Chest 2 View 1. No acute cardiopulmonary abnormality identified.  [TT]  2113 Chest x-ray is negative for obvious consolidation, will proceed with CT PE study. [TT]  2118 Patient is saying that the Tylenol  is not helping, put in for 5 mg oxycodone  but he states that he typically takes 10 mg of oxycodone  at home and 1 something stronger, I will put in a dose of IV Dilaudid  for him. [TT]  2154 CT Angio Chest PE W/Cm &/Or Wo Cm IMPRESSION: 1. No evidence of pulmonary embolus. 2. No acute intrathoracic process. 3.  Aortic Atherosclerosis (ICD10-I70.0).   [TT]  2244 Troponin I (High Sensitivity) Negative [TT]    Clinical Course User Index [TT] Waymond Lorelle Cummins, MD     FINAL CLINICAL IMPRESSION(S) / ED DIAGNOSES   Final diagnoses:  Cough, unspecified  type  Chest wall pain  Atypical pneumonia     Rx / DC Orders   ED Discharge Orders          Ordered    lidocaine  (LIDODERM ) 5 %  Every 24 hours        03/12/24 2156    doxycycline (VIBRA-TABS) 100 MG tablet  2 times daily        03/12/24 2245             Note:  This document was prepared using Dragon voice recognition software and may include unintentional dictation errors.    Waymond Lorelle Cummins, MD 03/12/24 [phone]

## 2024-04-07 ENCOUNTER — Emergency Department

## 2024-04-07 ENCOUNTER — Emergency Department
Admission: EM | Admit: 2024-04-07 | Discharge: 2024-04-08 | Disposition: A | Discharge status: Home / Self Care | Attending: Emergency Medicine | Admitting: Emergency Medicine

## 2024-04-07 ENCOUNTER — Other Ambulatory Visit: Payer: Self-pay

## 2024-04-07 ENCOUNTER — Ambulatory Visit: Payer: Self-pay

## 2024-04-07 DIAGNOSIS — R079 Chest pain, unspecified: Secondary | ICD-10-CM | POA: Insufficient documentation

## 2024-04-07 DIAGNOSIS — I129 Hypertensive chronic kidney disease with stage 1 through stage 4 chronic kidney disease, or unspecified chronic kidney disease: Secondary | ICD-10-CM | POA: Diagnosis not present

## 2024-04-07 DIAGNOSIS — R110 Nausea: Secondary | ICD-10-CM | POA: Diagnosis not present

## 2024-04-07 DIAGNOSIS — R1030 Lower abdominal pain, unspecified: Secondary | ICD-10-CM

## 2024-04-07 DIAGNOSIS — K625 Hemorrhage of anus and rectum: Secondary | ICD-10-CM | POA: Diagnosis present

## 2024-04-07 DIAGNOSIS — N189 Chronic kidney disease, unspecified: Secondary | ICD-10-CM | POA: Diagnosis not present

## 2024-04-07 LAB — CBC
HCT: 42.8 % (ref 39.0–52.0)
Hemoglobin: 14.4 g/dL (ref 13.0–17.0)
MCH: 31.1 pg (ref 26.0–34.0)
MCHC: 33.6 g/dL (ref 30.0–36.0)
MCV: 92.4 fL (ref 80.0–100.0)
Platelets: 264 K/uL (ref 150–400)
RBC: 4.63 MIL/uL (ref 4.22–5.81)
RDW: 17.2 % — ABNORMAL HIGH (ref 11.5–15.5)
WBC: 12.7 K/uL — ABNORMAL HIGH (ref 4.0–10.5)
nRBC: 0 % (ref 0.0–0.2)

## 2024-04-07 LAB — TYPE AND SCREEN
ABO/RH(D): A POS
Antibody Screen: NEGATIVE

## 2024-04-07 LAB — COMPREHENSIVE METABOLIC PANEL WITH GFR
ALT: 15 U/L (ref 0–44)
AST: 16 U/L (ref 15–41)
Albumin: 4.2 g/dL (ref 3.5–5.0)
Alkaline Phosphatase: 79 U/L (ref 38–126)
Anion gap: 10 (ref 5–15)
BUN: 14 mg/dL (ref 8–23)
CO2: 27 mmol/L (ref 22–32)
Calcium: 9.5 mg/dL (ref 8.9–10.3)
Chloride: 104 mmol/L (ref 98–111)
Creatinine, Ser: 1.07 mg/dL (ref 0.61–1.24)
GFR, Estimated: >60 mL/min (ref >60)
Glucose, Bld: 115 mg/dL — ABNORMAL HIGH (ref 70–99)
Potassium: 3.6 mmol/L (ref 3.5–5.1)
Sodium: 141 mmol/L (ref 135–145)
Total Bilirubin: 0.5 mg/dL (ref 0.0–1.2)
Total Protein: 7.2 g/dL (ref 6.5–8.1)

## 2024-04-07 LAB — TROPONIN T, HIGH SENSITIVITY: Troponin T High Sensitivity: 17 ng/L (ref 0–19)

## 2024-04-07 MED ORDER — MORPHINE SULFATE (PF) 4 MG/ML IV SOLN
4.0000 mg | Freq: Once | INTRAVENOUS | Status: AC
  Administered 2024-04-07: 4 mg via INTRAVENOUS
  Filled 2024-04-07: qty 1

## 2024-04-07 MED ORDER — IOHEXOL 350 MG/ML SOLN
100.0000 mL | Freq: Once | INTRAVENOUS | Status: AC | PRN
  Administered 2024-04-07: 100 mL via INTRAVENOUS

## 2024-04-07 MED ORDER — HYDROMORPHONE HCL 1 MG/ML IJ SOLN
0.5000 mg | Freq: Once | INTRAMUSCULAR | Status: AC
  Administered 2024-04-07: 0.5 mg via INTRAVENOUS
  Filled 2024-04-07: qty 0.5

## 2024-04-07 NOTE — Discharge Instructions (Signed)
 Call the GI doctor.  Return to the ED with any worsening symptoms in the meantime

## 2024-04-07 NOTE — ED Provider Notes (Signed)
 [facility] Provider Note    Event Date/Time   First MD Initiated Contact with Patient 04/07/24 2010     (approximate)   History   Rectal Bleeding, Chest Pain, and Abdominal Pain   HPI  Earl Murray is a 68 y.o. male who presents to the ED for evaluation of Rectal Bleeding, Chest Pain, and Abdominal Pain   Reviewed PCP visit from 10/27.  History of HTN, CKD, Eliquis  and VTE  Patient presents with lower abdominal pain cramping over multiple days.  Reports bloody stool alongside this, nausea.  Reports this happens recurrently intermittently and they can never tell me why this happens.   Physical Exam   Triage Vital Signs: ED Triage Vitals  Encounter Vitals Group     BP 04/07/24 1829 128/74     Girls Systolic BP Percentile --      Girls Diastolic BP Percentile --      Boys Systolic BP Percentile --      Boys Diastolic BP Percentile --      Pulse Rate 04/07/24 1829 (!) 103     Resp 04/07/24 1829 16     Temp 04/07/24 1829 97.8 F (36.6 C)     Temp Source 04/07/24 1829 Oral     SpO2 04/07/24 1829 99 %     Weight 04/07/24 1832 196 lb (88.9 kg)     Height 04/07/24 1832 6' 5 (1.956 m)     Head Circumference --      Peak Flow --      Pain Score 04/07/24 1829 9     Pain Loc --      Pain Education --      Exclude from Growth Chart --     Most recent vital signs: Vitals:   04/07/24 2300 04/07/24 2309  BP: 131/68   Pulse: (!) 55   Resp: 18   Temp:  98 F (36.7 C)  SpO2: 95%     General: Awake, no distress.  CV:  Good peripheral perfusion.  Resp:  Normal effort.  Abd:  No distention.  Soft and nontender MSK:  No deformity noted.  Neuro:  No focal deficits appreciated. Other:     ED Results / Procedures / Treatments   Labs (all labs ordered are listed, but only abnormal results are displayed) Labs Reviewed  COMPREHENSIVE METABOLIC PANEL WITH GFR - Abnormal; Notable for the following components:      Result Value   Glucose, Bld  115 (*)    All other components within normal limits  CBC - Abnormal; Notable for the following components:   WBC 12.7 (*)    RDW 17.2 (*)    All other components within normal limits  URINALYSIS, ROUTINE W REFLEX MICROSCOPIC  POC OCCULT BLOOD, ED  TYPE AND SCREEN  TROPONIN T, HIGH SENSITIVITY  TROPONIN T, HIGH SENSITIVITY    EKG Sinus rhythm with a rate of 98 bpm.  Tremulous baseline clouds fine detail.  No clear signs of acute ischemia.  RADIOLOGY CXR interpreted by me without evidence of acute cardiopulmonary pathology. CTA GI bleed study interpreted by me without evidence of acute pathology.  Official radiology report(s): CT ANGIO GI BLEED Result Date: 04/07/2024 EXAM: CTA ABDOMEN AND PELVIS WITH AND WITHOUT CONTRAST 04/07/2024 09:16:22 PM TECHNIQUE: CTA images of the abdomen and pelvis without and with intravenous contrast (100 mL iohexol  (OMNIPAQUE ) 350 MG/ML injection). Three-dimensional MIP/volume rendered formations were performed. Automated exposure control, iterative reconstruction, and/or weight based adjustment of the  mA/kV was utilized to reduce the radiation dose to as low as reasonably achievable. COMPARISON: None available. CLINICAL HISTORY: on anticoagulation, lower abd cramping, hematochezia FINDINGS: VASCULATURE: GI BLEED: No intraluminal spillage of contrast to suggest active GI bleeding. AORTA: No acute finding. No abdominal aortic aneurysm. No dissection. CELIAC TRUNK: No acute finding. No occlusion or significant stenosis. SUPERIOR MESEMTRIC ARTERY: No acute finding. No occlusion or significant stenosis. INFERIOR MESENTERIC ARTERY: No acute finding. No occlusion or significant stenosis. RENAL ARTERIES: No acute finding. No occlusion or significant stenosis. ILIAC ARTERIES: No acute finding. No occlusion or significant stenosis. ABDOMEN/PELVIS: LOWER CHEST: Visualized portion of the lower chest demonstrates no acute abnormality. Thoracic simulator, incompletely  visualized. LIVER: The liver is unremarkable. GALLBLADDER AND BILE DUCTS: Status post cholecystectomy. Pneumobilia with mild to moderate dilatation of the common duct, postprocedural. SPLEEN: The spleen is unremarkable. PANCREAS: The pancreas is unremarkable. ADRENAL GLANDS: Bilateral adrenal glands demonstrate no acute abnormality. KIDNEYS, URETERS AND BLADDER: 4.0 cm left renal cyst, benign. No follow-up is recommended. No stones in the kidneys or ureters. No hydronephrosis. No perinephric or periureteral stranding. Urinary bladder is unremarkable. GI AND BOWEL: Stomach and duodenal sweep demonstrate no acute abnormality. There is no bowel obstruction. No abnormal bowel wall thickening or distension. No colonic wall thickening or mass is evident on CT. REPRODUCTIVE: The prostate is unremarkable. PERITONEUM AND RETROPERITONEUM: No ascites or free air. LYMPH NODES: No lymphadenopathy. BONES AND SOFT TISSUES: Lower thoracolumbar fixation rods extending from T10-S1. Stable mild compression fracture deformity at L4. No acute soft tissue abnormality. IMPRESSION: 1. No active gastrointestinal bleeding. 2. No colonic wall thickening or mass is evident on CT. 3. Postsurgical and ancillary findings, as above. Electronically signed by: Pinkie Pebbles MD 04/07/2024 09:36 PM EST RP Workstation: [id number]   DG Chest 2 View Result Date: 04/07/2024 CLINICAL DATA:  Bright red rectal bleeding with upper abdominal pain and left-sided chest pain. EXAM: CHEST - 2 VIEW COMPARISON:  March 12, 2024 FINDINGS: The heart size and mediastinal contours are within normal limits. No acute infiltrate, pleural effusion or pneumothorax is identified. There is stable spinal stimulator wire positioning with postoperative changes seen within the lower thoracic and upper lumbar spine. IMPRESSION: No active cardiopulmonary disease. Electronically Signed   By: Suzen Dials M.D.   On: 04/07/2024 19:33    PROCEDURES and  INTERVENTIONS:  Procedures  Medications  HYDROmorphone  (DILAUDID ) injection 0.5 mg (0.5 mg Intravenous Given 04/07/24 2055)  iohexol  (OMNIPAQUE ) 350 MG/ML injection 100 mL (100 mLs Intravenous Contrast Given 04/07/24 2103)  morphine  (PF) 4 MG/ML injection 4 mg (4 mg Intravenous Given 04/07/24 2240)     IMPRESSION / MDM / ASSESSMENT AND PLAN / ED COURSE  I reviewed the triage vital signs and the nursing notes.  Differential diagnosis includes, but is not limited to, diverticulitis, SBO, hemorrhoidal bleed, colitis, IBS  {Patient presents with symptoms of an acute illness or injury that is potentially life-threatening.  Patient presents with acute on chronic lower abdominal pain and reports of hematochezia.  Normal hemoglobin here and benign exam.  Reassuring CT.  Awaiting UA at the time of signout to oncoming physician.  Anticipate he will be suitable for outpatient management follow-up with GI as an outpatient to discuss colonoscopy.  Clinical Course as of 04/07/24 2350  Fri Apr 07, 2024  2048 24hrs hematochezia. x4 [DS]    Clinical Course User Index [DS] Claudene Rover, MD     FINAL CLINICAL IMPRESSION(S) / ED DIAGNOSES   Final diagnoses:  Lower  abdominal pain     Rx / DC Orders   ED Discharge Orders     None        Note:  This document was prepared using Dragon voice recognition software and may include unintentional dictation errors.   Claudene Rover, MD 04/07/24 [phone]

## 2024-04-07 NOTE — ED Provider Notes (Signed)
-----------------------------------------   11:49 PM on 04/07/2024 -----------------------------------------  Assuming care from Dr. Claudene.  In short, Earl Murray is a 68 y.o. male with a chief complaint of blood in stool.  Refer to the original H&P for additional details.  The current plan of care is to follow up on urinalysis and d/c if negative.   Clinical Course as of 04/08/24 9361  [place] Apr 07, 2024  2048 24hrs hematochezia. x4 [DS]  Sat Apr 08, 2024  0024 Urinalysis is completely negative.  I will discharge the patient as per the plan established by Dr. Claudene [CF]    Clinical Course User Index [CF] Gordan Huxley, MD [DS] Claudene Rover, MD     Medications  HYDROmorphone  (DILAUDID ) injection 0.5 mg (0.5 mg Intravenous Given 04/07/24 2055)  iohexol  (OMNIPAQUE ) 350 MG/ML injection 100 mL (100 mLs Intravenous Contrast Given 04/07/24 2103)  morphine  (PF) 4 MG/ML injection 4 mg (4 mg Intravenous Given 04/07/24 2240)     ED Discharge Orders     None      Final diagnoses:  Lower abdominal pain     Gordan Huxley, MD 04/08/24 [phone]

## 2024-04-07 NOTE — ED Notes (Signed)
Blue top sent with other labs. 

## 2024-04-07 NOTE — Telephone Encounter (Signed)
 FYI Only or Action Required?: FYI only for provider: ED advised.  Patient was last seen in primary care on non-est patient.  Called Nurse Triage reporting Blood In Stools.  Symptoms began yesterday.  Interventions attempted: Nothing.  Symptoms are: gradually worsening.  Triage Disposition: Go to ED Now (Notify PCP)  Patient/caregiver understands and will follow disposition?: Yes        Copied from CRM [id number]. Topic: Clinical - Red Word Triage >> Apr 07, 2024  4:45 PM Travis FALCON wrote: Red Word that prompted transfer to Nurse Triage: Blood in stool. Patient is calling in on the community line. Patient states every time he has a bowel movement there is blood in it. Reason for Disposition  [1] MODERATE rectal bleeding (e.g., small blood clots, passing blood without stool, or toilet water turns red) AND [2] more than once a day  Answer Assessment - Initial Assessment Questions 1. APPEARANCE of BLOOD: What color is it? Is it passed separately, on the surface of the stool, or mixed in with the stool?      On surface of stool 2. AMOUNT: How much blood was passed?      unknown 3. FREQUENCY: How many times has blood been passed with the stools?      4x in the past 2 days 4. ONSET: When was the blood first seen in the stools? (Days or weeks)      yesterday 5. DIARRHEA: Is there also some diarrhea? If Yes, ask: How many diarrhea stools in the past 24 hours?      yes 6. CONSTIPATION: Do you have constipation? If Yes, ask: How bad is it?     N/a 7. RECURRENT SYMPTOMS: Have you had blood in your stools before? If Yes, ask: When was the last time? and What happened that time?      denies 8. BLOOD THINNERS: Do you take any blood thinners? (e.g., aspirin , clopidogrel / Plavix, coumadin, heparin ). Notes: Other strong blood thinners include: Arixtra (fondaparinux), Eliquis  (apixaban ), Pradaxa (dabigatran), and Xarelto (rivaroxaban).     eliquis  9. OTHER SYMPTOMS:  Do you have any other symptoms?  (e.g., abdomen pain, vomiting, dizziness, fever)     Vomiting, abd pain, dizziness 10. PREGNANCY: Is there any chance you are pregnant? When was your last menstrual period?       N/a  Protocols used: Rectal Bleeding-A-AH

## 2024-04-07 NOTE — ED Triage Notes (Signed)
 Pt to ED for bright red rectal bleeding, L chest pain and LUQ and RLQ abdominal pain since last few days.   EMS meds: 324mg  aspirin  and 2 NTG SL given by EMS EMS VS: 124/70 after NTG, CBG 112, 98% RA, HR 97. EMS EKG sinus rhythm.  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  Respirations unlabored, skin dry

## 2024-04-08 LAB — URINALYSIS, ROUTINE W REFLEX MICROSCOPIC
Bilirubin Urine: NEGATIVE
Glucose, UA: NEGATIVE mg/dL
Hgb urine dipstick: NEGATIVE
Ketones, ur: NEGATIVE mg/dL
Leukocytes,Ua: NEGATIVE
Nitrite: NEGATIVE
Protein, ur: NEGATIVE mg/dL
Specific Gravity, Urine: >1.046 — ABNORMAL HIGH (ref 1.005–1.030)
pH: 5 (ref 5.0–8.0)

## 2024-04-08 LAB — TROPONIN T, HIGH SENSITIVITY: Troponin T High Sensitivity: 18 ng/L (ref 0–19)

## 2024-04-11 ENCOUNTER — Other Ambulatory Visit: Payer: Self-pay

## 2024-04-11 ENCOUNTER — Emergency Department: Admission: EM | Admit: 2024-04-11 | Discharge: 2024-04-11 | Disposition: A | Discharge status: Home / Self Care

## 2024-04-11 ENCOUNTER — Emergency Department

## 2024-04-11 DIAGNOSIS — D72829 Elevated white blood cell count, unspecified: Secondary | ICD-10-CM | POA: Diagnosis not present

## 2024-04-11 DIAGNOSIS — Z7901 Long term (current) use of anticoagulants: Secondary | ICD-10-CM | POA: Diagnosis not present

## 2024-04-11 DIAGNOSIS — R109 Unspecified abdominal pain: Secondary | ICD-10-CM

## 2024-04-11 DIAGNOSIS — R079 Chest pain, unspecified: Secondary | ICD-10-CM

## 2024-04-11 DIAGNOSIS — R0789 Other chest pain: Secondary | ICD-10-CM | POA: Diagnosis not present

## 2024-04-11 DIAGNOSIS — R1084 Generalized abdominal pain: Secondary | ICD-10-CM | POA: Insufficient documentation

## 2024-04-11 LAB — CBC
HCT: 45.1 % (ref 39.0–52.0)
Hemoglobin: 15.1 g/dL (ref 13.0–17.0)
MCH: 31.3 pg (ref 26.0–34.0)
MCHC: 33.5 g/dL (ref 30.0–36.0)
MCV: 93.4 fL (ref 80.0–100.0)
Platelets: 273 K/uL (ref 150–400)
RBC: 4.83 MIL/uL (ref 4.22–5.81)
RDW: 16.8 % — ABNORMAL HIGH (ref 11.5–15.5)
WBC: 16.5 K/uL — ABNORMAL HIGH (ref 4.0–10.5)
nRBC: 0 % (ref 0.0–0.2)

## 2024-04-11 LAB — BASIC METABOLIC PANEL WITH GFR
Anion gap: 12 (ref 5–15)
BUN: 19 mg/dL (ref 8–23)
CO2: 24 mmol/L (ref 22–32)
Calcium: 9.7 mg/dL (ref 8.9–10.3)
Chloride: 106 mmol/L (ref 98–111)
Creatinine, Ser: 1.06 mg/dL (ref 0.61–1.24)
GFR, Estimated: >60 mL/min (ref >60)
Glucose, Bld: 93 mg/dL (ref 70–99)
Potassium: 3.9 mmol/L (ref 3.5–5.1)
Sodium: 141 mmol/L (ref 135–145)

## 2024-04-11 LAB — TROPONIN T, HIGH SENSITIVITY
Troponin T High Sensitivity: 17 ng/L (ref 0–19)
Troponin T High Sensitivity: 16 ng/L (ref 0–19)

## 2024-04-11 MED ORDER — OXYCODONE HCL 5 MG PO TABS
10.0000 mg | ORAL_TABLET | Freq: Once | ORAL | Status: AC
  Administered 2024-04-11: 10 mg via ORAL
  Filled 2024-04-11: qty 2

## 2024-04-11 MED ORDER — ALUM & MAG HYDROXIDE-SIMETH 200-200-20 MG/5ML PO SUSP
30.0000 mL | Freq: Once | ORAL | Status: AC
  Administered 2024-04-11: 30 mL via ORAL
  Filled 2024-04-11: qty 30

## 2024-04-11 MED ORDER — FAMOTIDINE 20 MG PO TABS
20.0000 mg | ORAL_TABLET | Freq: Once | ORAL | Status: AC
  Administered 2024-04-11: 20 mg via ORAL
  Filled 2024-04-11: qty 1

## 2024-04-11 MED ORDER — DICYCLOMINE HCL 10 MG PO CAPS
10.0000 mg | ORAL_CAPSULE | Freq: Once | ORAL | Status: AC
  Administered 2024-04-11: 10 mg via ORAL
  Filled 2024-04-11: qty 1

## 2024-04-11 MED ORDER — LIDOCAINE VISCOUS HCL 2 % MT SOLN
15.0000 mL | Freq: Once | OROMUCOSAL | Status: AC
  Administered 2024-04-11: 15 mL via ORAL
  Filled 2024-04-11: qty 15

## 2024-04-11 NOTE — ED Triage Notes (Signed)
 Pt also c/o chest pain started today and continued rectal bleeding.

## 2024-04-11 NOTE — Discharge Instructions (Signed)
 You were seen today due to concern of abdominal and chest pain.  At this time fortunately your labs are reassuring.  Please be sure to follow-up with your gastroenterologist as you have planned.  If you have any worsening of symptoms such as increased in severity and chest pain, shortness of breath, lightheadedness, or any other symptoms you find concerning please return to the emergency department immediately for further medical management.

## 2024-04-11 NOTE — ED Provider Notes (Signed)
 [facility] Provider Note    Event Date/Time   First MD Initiated Contact with Patient 04/11/24 1954     (approximate)   History   Abdominal Pain and Chest Pain   HPI  Earl Murray is a 68 y.o. male who presents today with concern of abdominal pain.  He was just seen recently for similar complaints, at that time having generalized abdominal pain and also complaining of bloody stool.  He was evaluated at that time had CT imaging done and was able to be discharged home.  Since discharge, he continued having symptoms, attempting to follow-up with gastroenterology in the outpatient setting but is not able to follow-up with them until December, decided come back into the emergency department for further evaluation.  Also complaining of some chest discomfort, which has been off-and-on for prolonged period of time but he states that it feels like it comes on whenever he tries to eat chicken sausage, seems that he also had it when he was here during his last visit.  He does have a history of PEs in the past, he is on Eliquis , he states that today does not feel like a PE, he has been compliant with his Eliquis  he has not had any swelling in his extremities.  Denies any increased shortness of breath.  Has not been taking anything for his symptoms.  No known history of cardiac disease.      Physical Exam   Triage Vital Signs: ED Triage Vitals  Encounter Vitals Group     BP 04/11/24 1752 (!) 161/80     Girls Systolic BP Percentile --      Girls Diastolic BP Percentile --      Boys Systolic BP Percentile --      Boys Diastolic BP Percentile --      Pulse Rate 04/11/24 1752 96     Resp 04/11/24 1752 17     Temp 04/11/24 1752 97.8 F (36.6 C)     Temp Source 04/11/24 1752 Oral     SpO2 04/11/24 1752 99 %     Weight 04/11/24 1748 196 lb (88.9 kg)     Height 04/11/24 1748 6' 5 (1.956 m)     Head Circumference --      Peak Flow --      Pain Score 04/11/24 1745 9      Pain Loc --      Pain Education --      Exclude from Growth Chart --     Most recent vital signs: Vitals:   04/11/24 1752  BP: (!) 161/80  Pulse: 96  Resp: 17  Temp: 97.8 F (36.6 C)  SpO2: 99%     General: Awake, no distress.  CV:  Good peripheral perfusion.  Resp:  Normal effort.  Abd:  No distention.  Generalized abdominal discomfort to palpation Other:     ED Results / Procedures / Treatments   Labs (all labs ordered are listed, but only abnormal results are displayed) Labs Reviewed  CBC - Abnormal; Notable for the following components:      Result Value   WBC 16.5 (*)    RDW 16.8 (*)    All other components within normal limits  BASIC METABOLIC PANEL WITH GFR  TROPONIN T, HIGH SENSITIVITY  TROPONIN T, HIGH SENSITIVITY     EKG     RADIOLOGY   PROCEDURES:  Critical Care performed: No  Procedures   MEDICATIONS ORDERED IN ED: Medications  famotidine  (  PEPCID ) tablet 20 mg (20 mg Oral Given 04/11/24 2032)  alum & mag hydroxide-simeth (MAALOX/MYLANTA) 200-200-20 MG/5ML suspension 30 mL (30 mLs Oral Given 04/11/24 2032)    And  lidocaine  (XYLOCAINE ) 2 % viscous mouth solution 15 mL (15 mLs Oral Given 04/11/24 2032)  dicyclomine  (BENTYL ) capsule 10 mg (10 mg Oral Given 04/11/24 2032)  oxyCODONE  (Oxy IR/ROXICODONE ) immediate release tablet 10 mg (10 mg Oral Given 04/11/24 2134)     IMPRESSION / MDM / ASSESSMENT AND PLAN / ED COURSE  I reviewed the triage vital signs and the nursing notes.                               Patient's presentation is most consistent with acute, uncomplicated illness.  68 year old male who presents today with concern of chest pain and abdominal pain which has been ongoing for a few days now.  He appears well at rest he is not in any acute distress, his vitals here are reassuring, labs are obtained prior to my own evaluation, and they appear to be within normal limits with a slight elevation in the leukocytosis from what  seems to be near his baseline.  I reviewed his recent ER visit and his recent CT imaging which did not give a great explanation for his symptoms.  He has had colonoscopies and endoscopies done before in the past, it does seem that he had choledocholithiasis before, but his clinical exam does not appear similar given the location of his symptoms at this time.  In regards to his GI bleed he is on the Eliquis , but his hemoglobin here is trending upwards, and again he is not in any acute distress while at rest I feel limited utility of repeat CT imaging now.  I feel unlikely PE ACS, possibly GERD symptoms given the chest pain that he is experiencing.  Unsure what to make of the abdominal pain I suspect most likely inflammatory bowel disease, will attempt symptomatic management but ultimately likely reasonable for discharge home with continued plan for outpatient GI follow-up.   Clinical Course as of 04/11/24 2357  Tue Apr 11, 2024  2119 Unfortunately minimal improvement of patient's symptoms at this time.  His troponin here is reassuring, his abdominal pain seems very similar to his presentation from earlier, where he had CT imaging done which was without acute findings.  He does have a slight increase in his white blood cell count but it seems that it can wax and wane around this area.  I suspect most likely this may be consistent with inflammatory bowel disease as he does have a sister who was diagnosed with ulcerative colitis.  Given his symptomatology believe reasonable for discharge home we will give him a dose of pain medication, and encouraged follow-up with his gastroenterologist in the outpatient setting. [SK]  2120 RDW(!): 16.8 [SK]    Clinical Course User Index [SK] Fernand Rossie HERO, MD     FINAL CLINICAL IMPRESSION(S) / ED DIAGNOSES   Final diagnoses:  Chest pain, unspecified type  Abdominal pain, unspecified abdominal location     Rx / DC Orders   ED Discharge Orders     None         Note:  This document was prepared using Dragon voice recognition software and may include unintentional dictation errors.   Fernand Rossie HERO, MD 04/11/24 [phone]

## 2024-04-11 NOTE — ED Triage Notes (Signed)
 First nurse note: pt to ED [facility] from home for continued abd pain, was seen for same on 11/14 and referred to GI, cannot be seen until December.

## 2024-05-30 ENCOUNTER — Ambulatory Visit: Admitting: Anesthesiology

## 2024-05-30 ENCOUNTER — Other Ambulatory Visit: Payer: Self-pay

## 2024-05-30 ENCOUNTER — Encounter
Admission: RE | Disposition: A | Discharge status: Home / Self Care | Payer: Self-pay | Source: Home / Self Care | Attending: Gastroenterology

## 2024-05-30 ENCOUNTER — Encounter: Payer: Self-pay | Admitting: Gastroenterology

## 2024-05-30 ENCOUNTER — Ambulatory Visit
Admission: RE | Admit: 2024-05-30 | Discharge: 2024-05-30 | Disposition: A | Discharge status: Home / Self Care | Attending: Gastroenterology | Admitting: Gastroenterology

## 2024-05-30 DIAGNOSIS — R140 Abdominal distension (gaseous): Secondary | ICD-10-CM | POA: Diagnosis not present

## 2024-05-30 DIAGNOSIS — Z6822 Body mass index (BMI) 22.0-22.9, adult: Secondary | ICD-10-CM | POA: Diagnosis not present

## 2024-05-30 DIAGNOSIS — N189 Chronic kidney disease, unspecified: Secondary | ICD-10-CM | POA: Diagnosis not present

## 2024-05-30 DIAGNOSIS — G4730 Sleep apnea, unspecified: Secondary | ICD-10-CM | POA: Diagnosis not present

## 2024-05-30 DIAGNOSIS — F419 Anxiety disorder, unspecified: Secondary | ICD-10-CM | POA: Insufficient documentation

## 2024-05-30 DIAGNOSIS — K2950 Unspecified chronic gastritis without bleeding: Secondary | ICD-10-CM | POA: Diagnosis not present

## 2024-05-30 DIAGNOSIS — R634 Abnormal weight loss: Secondary | ICD-10-CM | POA: Insufficient documentation

## 2024-05-30 DIAGNOSIS — R1013 Epigastric pain: Secondary | ICD-10-CM | POA: Diagnosis present

## 2024-05-30 DIAGNOSIS — I129 Hypertensive chronic kidney disease with stage 1 through stage 4 chronic kidney disease, or unspecified chronic kidney disease: Secondary | ICD-10-CM | POA: Diagnosis not present

## 2024-05-30 DIAGNOSIS — F17210 Nicotine dependence, cigarettes, uncomplicated: Secondary | ICD-10-CM | POA: Insufficient documentation

## 2024-05-30 DIAGNOSIS — F32A Depression, unspecified: Secondary | ICD-10-CM | POA: Diagnosis not present

## 2024-05-30 HISTORY — PX: BIOPSY OF SKIN SUBCUTANEOUS TISSUE AND/OR MUCOUS MEMBRANE: SHX6741

## 2024-05-30 HISTORY — PX: ESOPHAGOGASTRODUODENOSCOPY: SHX5428

## 2024-05-30 HISTORY — PX: COLONOSCOPY: SHX5424

## 2024-05-30 SURGERY — COLONOSCOPY
Anesthesia: Monitor Anesthesia Care

## 2024-05-30 MED ORDER — FENTANYL CITRATE (PF) 100 MCG/2ML IJ SOLN
INTRAMUSCULAR | Status: DC | PRN
  Administered 2024-05-30: 50 ug via INTRAVENOUS

## 2024-05-30 MED ORDER — SODIUM CHLORIDE 0.9 % IV SOLN: INTRAVENOUS | Status: DC

## 2024-05-30 MED ORDER — LIDOCAINE HCL (PF) 2 % IJ SOLN
INTRAMUSCULAR | Status: AC
  Filled 2024-05-30: qty 5

## 2024-05-30 MED ORDER — PROPOFOL 1000 MG/100ML IV EMUL
INTRAVENOUS | Status: AC
  Filled 2024-05-30: qty 100

## 2024-05-30 MED ORDER — PROPOFOL 10 MG/ML IV BOLUS
INTRAVENOUS | Status: DC | PRN
  Administered 2024-05-30: 100 mg via INTRAVENOUS

## 2024-05-30 MED ORDER — FENTANYL CITRATE (PF) 100 MCG/2ML IJ SOLN
INTRAMUSCULAR | Status: AC
  Filled 2024-05-30: qty 2

## 2024-05-30 MED ORDER — LIDOCAINE HCL (CARDIAC) PF 100 MG/5ML IV SOSY
PREFILLED_SYRINGE | INTRAVENOUS | Status: DC | PRN
  Administered 2024-05-30: 100 mg via INTRAVENOUS

## 2024-05-30 NOTE — Progress Notes (Signed)
 Colonoscopy cancelled due to poor prep  RV

## 2024-05-30 NOTE — Anesthesia Preprocedure Evaluation (Signed)
"                                    Anesthesia Evaluation  Patient identified by MRN, date of birth, ID band Patient awake    Reviewed: Allergy & Precautions, H&P , NPO status , Patient's Chart, lab work & pertinent test results  Airway Mallampati: II  TM Distance: >3 FB Neck ROM: Full    Dental no notable dental hx. (+) Missing, Chipped   Pulmonary neg pulmonary ROS, sleep apnea , Current Smoker   Pulmonary exam normal breath sounds clear to auscultation       Cardiovascular hypertension, negative cardio ROS Normal cardiovascular exam Rhythm:Regular Rate:Normal     Neuro/Psych  Headaches PSYCHIATRIC DISORDERS Anxiety Depression     Neuromuscular disease negative neurological ROS  negative psych ROS   GI/Hepatic negative GI ROS, Neg liver ROS,GERD  ,,(+) Hepatitis -  Endo/Other  negative endocrine ROS    Renal/GU Renal diseasenegative Renal ROS  negative genitourinary   Musculoskeletal negative musculoskeletal ROS (+)    Abdominal   Peds negative pediatric ROS (+)  Hematology negative hematology ROS (+)   Anesthesia Other Findings   Reproductive/Obstetrics negative OB ROS                              Anesthesia Physical Anesthesia Plan  ASA: 3  Anesthesia Plan: MAC and General   Post-op Pain Management: Minimal or no pain anticipated   Induction: Intravenous  PONV Risk Score and Plan: 1  Airway Management Planned: Mask  Additional Equipment:   Intra-op Plan:   Post-operative Plan:   Informed Consent: I have reviewed the patients History and Physical, chart, labs and discussed the procedure including the risks, benefits and alternatives for the proposed anesthesia with the patient or authorized representative who has indicated his/her understanding and acceptance.     Dental advisory given  Plan Discussed with: CRNA  Anesthesia Plan Comments:         Anesthesia Quick Evaluation  "

## 2024-05-30 NOTE — Op Note (Signed)
 [facility] Patient Name: Earl Murray Procedure Date: 05/30/2024 7:53 AM MRN: [id number] Account #: [id number] Date of Birth: 1956/05/21 Admit Type: Outpatient Age: 69 Room: [facility] ENDO ROOM 3 Gender: Male Note Status: Finalized Instrument Name: Endoscope [id number] Procedure:             Upper GI endoscopy Indications:           Epigastric abdominal pain, Abdominal bloating, Weight                         loss Providers:             Corinn Jess Brooklyn MD, MD Referring MD:          No Local Md, MD (Referring MD) Medicines:             General Anesthesia Complications:         No immediate complications. Estimated blood loss: None. Procedure:             Pre-Anesthesia Assessment:                        - Prior to the procedure, a History and Physical was                         performed, and patient medications and allergies were                         reviewed. The patient is competent. The risks and                         benefits of the procedure and the sedation options and                         risks were discussed with the patient. All questions                         were answered and informed consent was obtained.                         Patient identification and proposed procedure were                         verified by the physician, the nurse, the                         anesthesiologist, the anesthetist and the technician                         in the pre-procedure area in the procedure room in the                         endoscopy suite. Mental Status Examination: alert and                         oriented. Airway Examination: normal oropharyngeal                         airway and neck mobility. Respiratory Examination:  clear to auscultation. CV Examination: normal.                         Prophylactic Antibiotics: The patient does not require                         prophylactic antibiotics. Prior  Anticoagulants: The                         patient has taken Eliquis  (apixaban ), last dose was 3                         days prior to procedure. ASA Grade Assessment: III - A                         patient with severe systemic disease. After reviewing                         the risks and benefits, the patient was deemed in                         satisfactory condition to undergo the procedure. The                         anesthesia plan was to use general anesthesia.                         Immediately prior to administration of medications,                         the patient was re-assessed for adequacy to receive                         sedatives. The heart rate, respiratory rate, oxygen                         saturations, blood pressure, adequacy of pulmonary                         ventilation, and response to care were monitored                         throughout the procedure. The physical status of the                         patient was re-assessed after the procedure.                        After obtaining informed consent, the endoscope was                         passed under direct vision. Throughout the procedure,                         the patient's blood pressure, pulse, and oxygen                         saturations were monitored continuously. The Endoscope  was introduced through the mouth, and advanced to the                         second part of duodenum. The upper GI endoscopy was                         accomplished without difficulty. The patient tolerated                         the procedure well. Findings:      The duodenal bulb and second portion of the duodenum were normal.      A medium amount of food (residue) was found in the gastric fundus.      The gastric body, incisura and gastric antrum were normal. Biopsies were       taken with a cold forceps for histology.      The gastroesophageal junction and examined esophagus were  normal. Impression:            - Normal duodenal bulb and second portion of the                         duodenum.                        - A medium amount of food (residue) in the stomach.                        - Normal gastric body, incisura and antrum. Biopsied.                        - Normal gastroesophageal junction and esophagus. Recommendation:        - Await pathology results.                        - Discharge patient to home (with escort).                        - Clear liquid diet today.                        - Perform a colonoscopy tomorrow with repeat bowel                         prep today. Procedure Code(s):     --- Professional ---                        [phone], Esophagogastroduodenoscopy, flexible,                         transoral; with biopsy, single or multiple Diagnosis Code(s):     --- Professional ---                        R10.13, Epigastric pain                        R14.0, Abdominal distension (gaseous)                        R63.4, Abnormal weight loss CPT copyright 2022 American  Medical Association. All rights reserved. The codes documented in this report are preliminary and upon coder review may  be revised to meet current compliance requirements. Dr. Corinn Brooklyn Corinn Jess Brooklyn MD, MD 05/30/2024 8:17:20 AM This report has been signed electronically. Number of Addenda: 0 Note Initiated On: 05/30/2024 7:53 AM Estimated Blood Loss:  Estimated blood loss: none.      [facility]

## 2024-05-30 NOTE — H&P (Signed)
 "   Earl JONELLE Brooklyn, MD [facility], DHIP [address]  [place], [place] [address]  Main: [phone] Fax:  [phone] Pager: [phone]   Primary Care Physician:  Salli Amato, MD Primary Gastroenterologist:  Dr. Corinn JONELLE Murray  Pre-Procedure History & Physical: HPI:  Earl Murray is a 69 y.o. male is here for an endoscopy and colonoscopy.   Past Medical History:  Diagnosis Date   Bladder cancer (HCC)    Choledocholithiasis    Chronic kidney disease    Cocaine dependence in remission (HCC)    Concussion    Depression    DVT (deep venous thrombosis) (HCC)    GERD (gastroesophageal reflux disease)    Hepatitis C    History of cancer    Hyperlipidemia    Hypertension    Migraine    Neck injury    Opioid dependence in remission (HCC)    Pancreatitis    Prediabetes    Scoliosis    Sleep apnea     Past Surgical History:  Procedure Laterality Date   BACK SURGERY     CHOLECYSTECTOMY     ELBOW SURGERY     ERCP N/A 10/27/2019   Procedure: ENDOSCOPIC RETROGRADE CHOLANGIOPANCREATOGRAPHY (ERCP);  Surgeon: [name], MD;  Location: [facility] ENDOSCOPY;  Service: Endoscopy;  Laterality: N/A;   ERCP N/A 08/26/2023   Procedure: ERCP, WITH INTERVENTION IF INDICATED;  Surgeon: [name], MD;  Location: [facility] ENDOSCOPY;  Service: Endoscopy;  Laterality: N/A;   ERCP N/A 09/17/2023   Procedure: ERCP, WITH INTERVENTION IF INDICATED;  Surgeon: [name], MD;  Location: [facility] ENDOSCOPY;  Service: Endoscopy;  Laterality: N/A;   FOOT SURGERY     HERNIA REPAIR     4 hernia repairs   PULMONARY THROMBECTOMY N/A 10/14/2021   Procedure: PULMONARY THROMBECTOMY;  Surgeon: [name], MD;  Location: [facility] INVASIVE CV LAB;  Service: Cardiovascular;  Laterality: N/A;    Prior to Admission medications  Medication Sig Start Date End Date Taking? Authorizing Provider  amLODipine  (NORVASC ) 10 MG tablet Take 10 mg by mouth daily.    [provider]   apixaban  (ELIQUIS ) 5 MG TABS tablet Take 5 mg by mouth 2 (two) times daily. 12/08/22   [provider]  atorvastatin  (LIPITOR) 40 MG tablet Take 20 mg by mouth every evening. 08/23/23   [provider]  chlorthalidone  (HYGROTON ) 25 MG tablet Take 25 mg by mouth daily. 08/23/23   [provider]  DULoxetine  (CYMBALTA ) 30 MG capsule Take 30 mg by mouth daily.    [provider]  HYDROPHILIC EX Apply 1 Application topically daily as needed (dry skin). 06/08/23   [provider]  hydrOXYzine  (ATARAX ) 25 MG tablet Take 25 mg by mouth 2 (two) times daily as needed for anxiety. 08/23/23   [provider]  losartan  (COZAAR ) 100 MG tablet Take 100 mg by mouth daily. 08/19/23 11/17/23  [provider]  MENTHOL-METHYL SALICYLATE EX Apply 1 Application topically 4 (four) times daily as needed (pain relief). 06/08/23   [provider]  naloxone [facility]) nasal spray 4 mg/0.1 mL Place 1 spray into the nose once as needed (opiod overdose).    [provider]  PARoxetine  (PAXIL ) 40 MG tablet Take 20 mg by mouth every morning. 08/23/23   [provider]  tiZANidine  (ZANAFLEX ) 4 MG tablet Take 2 tablets (8 mg total) by mouth daily as needed for muscle spasms. 08/20/23   [name], MD  gabapentin  (NEURONTIN ) 300 MG capsule  gabapentin  300 mg capsule  one tab bid-tid  03/01/19  [provider]  ipratropium (ATROVENT ) 0.06 % nasal spray Place 2 sprays into both nostrils 4 (four) times daily as needed for rhinitis. 10/22/19 03/16/20  Gray, Bryan E, NP  levocetirizine (XYZAL ) 5 MG tablet Take 1 tablet (5 mg total) by mouth every evening. 09/30/19 03/16/20  Cook, Jayce G, DO    Allergies as of 05/01/2024 - Review Complete 04/11/2024  Allergen Reaction Noted   Gabapentin  Other (See Comments) 12/09/2022   Toradol  [ketorolac  tromethamine ] Itching 08/19/2023   Motrin [ibuprofen] Itching and Other (See Comments) 10/23/2017    Tramadol Rash 10/23/2017   Tylenol  with codeine #3 [acetaminophen -codeine] Itching 03/01/2019    Family History  Problem Relation Age of Onset   Hypertension Mother    Heart disease Mother    Colon cancer Father     Social History   Socioeconomic History   Marital status: Married    Spouse name: Not on file   Number of children: Not on file   Years of education: Not on file   Highest education level: Not on file  Occupational History   Not on file  Tobacco Use   Smoking status: Some Days    Current packs/day: 0.00    Types: Cigarettes    Last attempt to quit: 05/2018    Years since quitting: 6.0   Smokeless tobacco: Never  Vaping Use   Vaping status: Never Used  Substance and Sexual Activity   Alcohol use: Yes    Comment: occ   Drug use: Not Currently   Sexual activity: Not Currently  Other Topics Concern   Not on file  Social History Narrative   Not on file   Social Drivers of Health   Tobacco Use: High Risk (05/30/2024)   Patient History    Smoking Tobacco Use: Some Days    Smokeless Tobacco Use: Never    Passive Exposure: Not on file  Financial Resource Strain: Low Risk  (05/01/2024)   Received from [facility]   Overall Financial Resource Strain (CARDIA)    Difficulty of Paying Living Expenses: Not hard at all  Food Insecurity: No Food Insecurity (05/01/2024)   Received from [facility]   Epic    Within the past 12 months, you worried that your food would run out before you got the money to buy more.: Never true    Within the past 12 months, the food you bought just didn't last and you didn't have money to get more.: Never true  Transportation Needs: No Transportation Needs (05/01/2024)   Received from [facility] - Transportation    In the past 12 months, has lack of transportation kept you from medical appointments or from getting medications?: No    Lack of Transportation (Non-Medical): No   Physical Activity: Not on file  Stress: Not on file  Social Connections: Moderately Isolated (09/17/2023)   Social Connection and Isolation Panel    Frequency of Communication with Friends and Family: Never    Frequency of Social Gatherings with Friends and Family: Twice a week    Attends Religious Services: 1 to 4 times per year    Active Member of [employer] or Organizations: No    Attends Banker Meetings: Never    Marital Status: Married  Catering Manager Violence: Not At Risk (09/17/2023)   Humiliation, Afraid, Rape, and Kick questionnaire    Fear of Current or Ex-Partner:  No    Emotionally Abused: No    Physically Abused: No    Sexually Abused: No  Depression (PHQ2-9): Not on file  Alcohol Screen: Not on file  Housing: Low Risk  (05/01/2024)   Received from [facility]   Epic    In the last 12 months, was there a time when you were not able to pay the mortgage or rent on time?: No    In the past 12 months, how many times have you moved where you were living?: 0    At any time in the past 12 months, were you homeless or living in a shelter (including now)?: No  Utilities: Not At Risk (05/01/2024)   Received from [facility]   Epic    In the past 12 months has the electric, gas, oil, or water company threatened to shut off services in your home?: No  Health Literacy: Not on file    Review of Systems: See HPI, otherwise negative ROS  Physical Exam: BP (!) 167/94   Pulse (!) 55   Temp (!) 96.1 F (35.6 C) (Temporal)   Resp 20   Ht 6' 5 (1.956 m)   Wt 84.9 kg   SpO2 98%   BMI 22.20 kg/m  General:   Alert,  pleasant and cooperative in NAD Head:  Normocephalic and atraumatic. Neck:  Supple; no masses or thyromegaly. Lungs:  Clear throughout to auscultation.    Heart:  Regular rate and rhythm. Abdomen:  Soft, nontender and nondistended. Normal bowel sounds, without guarding, and without rebound.   Neurologic:  Alert and   oriented x4;  grossly normal neurologically.  Impression/Plan: Jaedin Regina is here for an endoscopy and colonoscopy to be performed for Abdominal bloating, generalized abdominal pain, weight loss   Risks, benefits, limitations, and alternatives regarding  endoscopy and colonoscopy have been reviewed with the patient.  Questions have been answered.  All parties agreeable.   Earl Brooklyn, MD  05/30/2024, 8:21 AM "

## 2024-05-30 NOTE — Anesthesia Postprocedure Evaluation (Signed)
"   Anesthesia Post Note  Patient: Earl Murray  Procedure(s) Performed: COLONOSCOPY EGD (ESOPHAGOGASTRODUODENOSCOPY)  Patient location during evaluation: PACU Anesthesia Type: MAC Level of consciousness: awake and alert Pain management: pain level controlled Vital Signs Assessment: post-procedure vital signs reviewed and stable Respiratory status: spontaneous breathing, nonlabored ventilation, respiratory function stable and patient connected to nasal cannula oxygen Cardiovascular status: blood pressure returned to baseline and stable Postop Assessment: no apparent nausea or vomiting Anesthetic complications: no   No notable events documented.   Last Vitals:  Vitals:   05/30/24 0832 05/30/24 0842  BP: (!) 151/84 (!) 145/82  Pulse: 75 69  Resp: 13 11  Temp:    SpO2: 98% 100%    Last Pain:  Vitals:   05/30/24 0842  TempSrc:   PainSc: 0-No pain                 Redell [name]      "

## 2024-05-30 NOTE — Transfer of Care (Signed)
 Immediate Anesthesia Transfer of Care Note  Patient: Earl Murray  Procedure(s) Performed: COLONOSCOPY EGD (ESOPHAGOGASTRODUODENOSCOPY)  Patient Location: PACU  Anesthesia Type:General  Level of Consciousness: awake and alert   Airway & Oxygen Therapy: Patient Spontanous Breathing  Post-op Assessment: Report given to RN and Post -op Vital signs reviewed and stable  Post vital signs: stable  Last Vitals:  Vitals Value Taken Time  BP    Temp    Pulse 72 05/30/24 08:21  Resp 10 05/30/24 08:21  SpO2 98 % 05/30/24 08:21  Vitals shown include unfiled device data.  Last Pain:  Vitals:   05/30/24 0759  TempSrc: Temporal  PainSc: 0-No pain         Complications: No notable events documented.

## 2024-05-31 ENCOUNTER — Ambulatory Visit: Admission: RE | Admit: 2024-05-31 | Source: Home / Self Care | Admitting: Gastroenterology

## 2024-05-31 ENCOUNTER — Encounter: Admission: RE | Payer: Self-pay | Source: Home / Self Care

## 2024-05-31 LAB — SURGICAL PATHOLOGY

## 2024-05-31 SURGERY — COLONOSCOPY
Anesthesia: General

## 2024-06-01 ENCOUNTER — Ambulatory Visit: Payer: Self-pay | Admitting: Gastroenterology

## 2024-06-07 ENCOUNTER — Encounter: Payer: Self-pay | Admitting: Gastroenterology

## 2024-06-07 ENCOUNTER — Ambulatory Visit
Admission: RE | Admit: 2024-06-07 | Discharge: 2024-06-07 | Disposition: A | Discharge status: Home / Self Care | Attending: Gastroenterology | Admitting: Gastroenterology

## 2024-06-07 ENCOUNTER — Encounter: Admission: RE | Disposition: A | Payer: Self-pay | Attending: Gastroenterology

## 2024-06-07 ENCOUNTER — Ambulatory Visit: Admitting: Anesthesiology

## 2024-06-07 ENCOUNTER — Other Ambulatory Visit: Payer: Self-pay

## 2024-06-07 HISTORY — PX: COLONOSCOPY: SHX5424

## 2024-06-07 HISTORY — PX: POLYPECTOMY: SHX149

## 2024-06-07 SURGERY — COLONOSCOPY
Anesthesia: General

## 2024-06-07 MED ORDER — SODIUM CHLORIDE 0.9 % IV SOLN: INTRAVENOUS | Status: DC

## 2024-06-07 MED ORDER — PROPOFOL 500 MG/50ML IV EMUL
INTRAVENOUS | Status: DC | PRN
  Administered 2024-06-07: 150 ug/kg/min via INTRAVENOUS

## 2024-06-07 MED ORDER — PROPOFOL 10 MG/ML IV BOLUS
INTRAVENOUS | Status: DC | PRN
  Administered 2024-06-07: 80 mg via INTRAVENOUS

## 2024-06-07 MED ORDER — SIMETHICONE 40 MG/0.6ML PO SUSP
ORAL | Status: DC | PRN
  Administered 2024-06-07 (×6): 60 mL

## 2024-06-07 MED ORDER — LIDOCAINE 2% (20 MG/ML) 5 ML SYRINGE
INTRAMUSCULAR | Status: DC | PRN
  Administered 2024-06-07: 60 mg via INTRAVENOUS

## 2024-06-07 NOTE — H&P (Signed)
 "   Corinn JONELLE Brooklyn, MD [facility], DHIP [address]  [place], [place] [address]  Main: [phone] Fax:  [phone] Pager: [phone]   Primary Care Physician:  Salli Amato, MD Primary Gastroenterologist:  Dr. Corinn JONELLE Brooklyn  Pre-Procedure History & Physical: HPI:  Earl Murray is a 69 y.o. male is here for an colonoscopy.   Past Medical History:  Diagnosis Date   Bladder cancer (HCC)    Choledocholithiasis    Chronic kidney disease    Cocaine dependence in remission (HCC)    Concussion    Depression    DVT (deep venous thrombosis) (HCC)    GERD (gastroesophageal reflux disease)    Hepatitis C    History of cancer    Hyperlipidemia    Hypertension    Migraine    Neck injury    Opioid dependence in remission (HCC)    Pancreatitis    Prediabetes    Scoliosis    Sleep apnea     Past Surgical History:  Procedure Laterality Date   BACK SURGERY     BIOPSY OF SKIN SUBCUTANEOUS TISSUE AND/OR MUCOUS MEMBRANE  05/30/2024   Procedure: BIOPSY, stomach;  Surgeon: Brooklyn Corinn Skiff, MD;  Location: [facility] ENDOSCOPY;  Service: Gastroenterology;;   CHOLECYSTECTOMY     COLONOSCOPY N/A 05/30/2024   Procedure: COLONOSCOPY;  Surgeon: Brooklyn Corinn Skiff, MD;  Location: [facility] ENDOSCOPY;  Service: Gastroenterology;  Laterality: N/A;   ELBOW SURGERY     ERCP N/A 10/27/2019   Procedure: ENDOSCOPIC RETROGRADE CHOLANGIOPANCREATOGRAPHY (ERCP);  Surgeon: [name], MD;  Location: [facility] ENDOSCOPY;  Service: Endoscopy;  Laterality: N/A;   ERCP N/A 08/26/2023   Procedure: ERCP, WITH INTERVENTION IF INDICATED;  Surgeon: [name], MD;  Location: [facility] ENDOSCOPY;  Service: Endoscopy;  Laterality: N/A;   ERCP N/A 09/17/2023   Procedure: ERCP, WITH INTERVENTION IF INDICATED;  Surgeon: [name], MD;  Location: [facility] ENDOSCOPY;  Service: Endoscopy;  Laterality: N/A;   ESOPHAGOGASTRODUODENOSCOPY N/A 05/30/2024   Procedure: EGD (ESOPHAGOGASTRODUODENOSCOPY);  Surgeon:  Brooklyn Corinn Skiff, MD;  Location: [facility] ENDOSCOPY;  Service: Gastroenterology;  Laterality: N/A;   FOOT SURGERY     HERNIA REPAIR     4 hernia repairs   PULMONARY THROMBECTOMY N/A 10/14/2021   Procedure: PULMONARY THROMBECTOMY;  Surgeon: [name], MD;  Location: [facility] INVASIVE CV LAB;  Service: Cardiovascular;  Laterality: N/A;    Prior to Admission medications  Medication Sig Start Date End Date Taking? Authorizing Provider  amLODipine  (NORVASC ) 10 MG tablet Take 10 mg by mouth daily.    [provider]  apixaban  (ELIQUIS ) 5 MG TABS tablet Take 5 mg by mouth 2 (two) times daily. 12/08/22   [provider]  atorvastatin  (LIPITOR) 40 MG tablet Take 20 mg by mouth every evening. 08/23/23   [provider]  chlorthalidone  (HYGROTON ) 25 MG tablet Take 25 mg by mouth daily. 08/23/23   [provider]  DULoxetine  (CYMBALTA ) 30 MG capsule Take 30 mg by mouth daily.    [provider]  HYDROPHILIC EX Apply 1 Application topically daily as needed (dry skin). 06/08/23   [provider]  hydrOXYzine  (ATARAX ) 25 MG tablet Take 25 mg by mouth 2 (two) times daily as needed for anxiety. 08/23/23   [provider]  losartan  (COZAAR ) 100 MG tablet Take 100 mg by mouth daily. 08/19/23 11/17/23  [provider]  MENTHOL-METHYL SALICYLATE EX Apply 1 Application topically 4 (four) times daily as needed (pain relief). 06/08/23  [provider]  naloxone [facility]) nasal spray 4 mg/0.1 mL Place 1 spray into the nose once as needed (opiod overdose).    [provider]  PARoxetine  (PAXIL ) 40 MG tablet Take 20 mg by mouth every morning. 08/23/23   [provider]  tiZANidine  (ZANAFLEX ) 4 MG tablet Take 2 tablets (8 mg total) by mouth daily as needed for muscle spasms. 08/20/23   [name], MD  gabapentin  (NEURONTIN ) 300 MG capsule gabapentin  300 mg capsule  one tab bid-tid  03/01/19  [provider]   ipratropium (ATROVENT ) 0.06 % nasal spray Place 2 sprays into both nostrils 4 (four) times daily as needed for rhinitis. 10/22/19 03/16/20  Gray, Bryan E, NP  levocetirizine (XYZAL ) 5 MG tablet Take 1 tablet (5 mg total) by mouth every evening. 09/30/19 03/16/20  Cook, Jayce G, DO    Allergies as of 06/01/2024 - Review Complete 05/30/2024  Allergen Reaction Noted   Gabapentin  Other (See Comments) 12/09/2022   Toradol  [ketorolac  tromethamine ] Itching 08/19/2023   Motrin [ibuprofen] Itching and Other (See Comments) 10/23/2017   Tramadol Rash 10/23/2017   Tylenol  with codeine #3 [acetaminophen -codeine] Itching 03/01/2019    Family History  Problem Relation Age of Onset   Hypertension Mother    Heart disease Mother    Colon cancer Father     Social History   Socioeconomic History   Marital status: Married    Spouse name: Not on file   Number of children: Not on file   Years of education: Not on file   Highest education level: Not on file  Occupational History   Not on file  Tobacco Use   Smoking status: Some Days    Current packs/day: 0.00    Types: Cigarettes    Last attempt to quit: 05/2018    Years since quitting: 6.0   Smokeless tobacco: Never  Vaping Use   Vaping status: Never Used  Substance and Sexual Activity   Alcohol use: Yes    Comment: occ   Drug use: Not Currently   Sexual activity: Not Currently  Other Topics Concern   Not on file  Social History Narrative   Not on file   Social Drivers of Health   Tobacco Use: High Risk (06/07/2024)   Patient History    Smoking Tobacco Use: Some Days    Smokeless Tobacco Use: Never    Passive Exposure: Not on file  Financial Resource Strain: Low Risk  (05/01/2024)   Received from [facility]   Overall Financial Resource Strain (CARDIA)    Difficulty of Paying Living Expenses: Not hard at all  Food Insecurity: No Food Insecurity (05/01/2024)   Received from [facility]   Epic     Within the past 12 months, you worried that your food would run out before you got the money to buy more.: Never true    Within the past 12 months, the food you bought just didn't last and you didn't have money to get more.: Never true  Transportation Needs: No Transportation Needs (05/01/2024)   Received from [facility] - Transportation    In the past 12 months, has lack of transportation kept you from medical appointments or from getting medications?: No    Lack of Transportation (Non-Medical): No  Physical Activity: Not on file  Stress: Not on file  Social Connections: Moderately Isolated (09/17/2023)   Social Connection and Isolation Panel    Frequency of Communication with  Friends and Family: Never    Frequency of Social Gatherings with Friends and Family: Twice a week    Attends Religious Services: 1 to 4 times per year    Active Member of [employer] or Organizations: No    Attends Banker Meetings: Never    Marital Status: Married  Catering Manager Violence: Not At Risk (09/17/2023)   Humiliation, Afraid, Rape, and Kick questionnaire    Fear of Current or Ex-Partner: No    Emotionally Abused: No    Physically Abused: No    Sexually Abused: No  Depression (PHQ2-9): Not on file  Alcohol Screen: Not on file  Housing: Low Risk  (05/01/2024)   Received from [facility]   Epic    In the last 12 months, was there a time when you were not able to pay the mortgage or rent on time?: No    In the past 12 months, how many times have you moved where you were living?: 0    At any time in the past 12 months, were you homeless or living in a shelter (including now)?: No  Utilities: Not At Risk (05/01/2024)   Received from [facility]   Epic    In the past 12 months has the electric, gas, oil, or water company threatened to shut off services in your home?: No  Health Literacy: Not on file    Review of Systems: See HPI,  otherwise negative ROS  Physical Exam: BP (!) 159/97   Pulse 93   Temp (!) 96.9 F (36.1 C) (Temporal)   Resp 18   Ht 6' 5 (1.956 m)   Wt 84.9 kg   SpO2 100%   BMI 22.20 kg/m  General:   Alert,  pleasant and cooperative in NAD Head:  Normocephalic and atraumatic. Neck:  Supple; no masses or thyromegaly. Lungs:  Clear throughout to auscultation.    Heart:  Regular rate and rhythm. Abdomen:  Soft, nontender and nondistended. Normal bowel sounds, without guarding, and without rebound.   Neurologic:  Alert and  oriented x4;  grossly normal neurologically.  Impression/Plan: Earl Murray is here for an colonoscopy to be performed for Evaluation of rectal bleeding, abdominal pain, bloating, and weight loss   Risks, benefits, limitations, and alternatives regarding  colonoscopy have been reviewed with the patient.  Questions have been answered.  All parties agreeable.   Corinn Brooklyn, MD  06/07/2024, 8:54 AM "

## 2024-06-07 NOTE — Transfer of Care (Signed)
 Immediate Anesthesia Transfer of Care Note  Patient: Earl Murray  Procedure(s) Performed: COLONOSCOPY POLYPECTOMY, INTESTINE  Patient Location: Endoscopy Unit  Anesthesia Type:General  Level of Consciousness: drowsy  Airway & Oxygen Therapy: Patient Spontanous Breathing  Post-op Assessment: Report given to RN and Post -op Vital signs reviewed and stable  Post vital signs: Reviewed and stable  Last Vitals:  Vitals Value Taken Time  BP 117/76 06/07/24 09:25  Temp    Pulse 94 06/07/24 09:26  Resp 18 06/07/24 09:26  SpO2 100 % 06/07/24 09:26  Vitals shown include unfiled device data.  Last Pain:  Vitals:   06/07/24 0833  TempSrc: Temporal  PainSc: 0-No pain         Complications: No notable events documented.

## 2024-06-07 NOTE — Anesthesia Preprocedure Evaluation (Addendum)
 "                                  Anesthesia Evaluation  Patient identified by MRN, date of birth, ID band Patient awake    Reviewed: Allergy & Precautions, NPO status , Patient's Chart, lab work & pertinent test results  Airway Mallampati: II  TM Distance: >3 FB Neck ROM: Full    Dental  (+) Partial Upper   Pulmonary sleep apnea , COPD, Current Smoker and Patient abstained from smoking.   Pulmonary exam normal  + decreased breath sounds      Cardiovascular Exercise Tolerance: Good hypertension, Pt. on medications Normal cardiovascular exam Rhythm:Regular Rate:Normal     Neuro/Psych  Headaches  Anxiety     negative neurological ROS  negative psych ROS   GI/Hepatic negative GI ROS,GERD  Medicated,,(+)     substance abuse  cocaine use  Endo/Other  negative endocrine ROS    Renal/GU CRFRenal disease  negative genitourinary   Musculoskeletal   Abdominal Normal abdominal exam  (+)   Peds negative pediatric ROS (+)  Hematology negative hematology ROS (+)   Anesthesia Other Findings Past Medical History: No date: Bladder cancer (HCC) No date: Choledocholithiasis No date: Chronic kidney disease No date: Cocaine dependence in remission (HCC) No date: Concussion No date: Depression No date: DVT (deep venous thrombosis) (HCC) No date: GERD (gastroesophageal reflux disease) No date: Hepatitis C No date: History of cancer No date: Hyperlipidemia No date: Hypertension No date: Migraine No date: Neck injury No date: Opioid dependence in remission (HCC) No date: Pancreatitis No date: Prediabetes No date: Scoliosis No date: Sleep apnea  Past Surgical History: No date: BACK SURGERY 05/30/2024: BIOPSY OF SKIN SUBCUTANEOUS TISSUE AND/OR MUCOUS MEMBRANE     Comment:  Procedure: BIOPSY, stomach;  Surgeon: Unk Corinn Skiff, MD;  Location: [facility] ENDOSCOPY;  Service:               Gastroenterology;; No date: CHOLECYSTECTOMY 05/30/2024:  COLONOSCOPY; N/A     Comment:  Procedure: COLONOSCOPY;  Surgeon: Unk Corinn Skiff,               MD;  Location: [facility] ENDOSCOPY;  Service:               Gastroenterology;  Laterality: N/A; No date: ELBOW SURGERY 10/27/2019: ERCP; N/A     Comment:  Procedure: ENDOSCOPIC RETROGRADE               CHOLANGIOPANCREATOGRAPHY (ERCP);  Surgeon: [name],               MD;  Location: [facility] ENDOSCOPY;  Service: Endoscopy;                Laterality: N/A; 08/26/2023: ERCP; N/A     Comment:  Procedure: ERCP, WITH INTERVENTION IF INDICATED;                Surgeon: [name], MD;  Location: [facility] ENDOSCOPY;                Service: Endoscopy;  Laterality: N/A; 09/17/2023: ERCP; N/A     Comment:  Procedure: ERCP, WITH INTERVENTION IF INDICATED;                Surgeon: [name], MD;  Location: [facility] ENDOSCOPY;  Service: Endoscopy;  Laterality: N/A; 05/30/2024: ESOPHAGOGASTRODUODENOSCOPY; N/A     Comment:  Procedure: EGD (ESOPHAGOGASTRODUODENOSCOPY);  Surgeon:               Unk Corinn Skiff, MD;  Location: [facility] ENDOSCOPY;                Service: Gastroenterology;  Laterality: N/A; No date: FOOT SURGERY No date: HERNIA REPAIR     Comment:  4 hernia repairs 10/14/2021: PULMONARY THROMBECTOMY; N/A     Comment:  Procedure: PULMONARY THROMBECTOMY;  Surgeon: [name], MD;  Location: [facility] INVASIVE CV LAB;  Service:              Cardiovascular;  Laterality: N/A;  BMI    Body Mass Index: 22.20 kg/m      Reproductive/Obstetrics negative OB ROS                              Anesthesia Physical Anesthesia Plan  ASA: 3  Anesthesia Plan: General   Post-op Pain Management:    Induction: Intravenous  PONV Risk Score and Plan: Propofol  infusion and TIVA  Airway Management Planned: Natural Airway and Nasal Cannula  Additional Equipment:   Intra-op Plan:   Post-operative Plan:   Informed Consent: I have reviewed the patients  History and Physical, chart, labs and discussed the procedure including the risks, benefits and alternatives for the proposed anesthesia with the patient or authorized representative who has indicated his/her understanding and acceptance.     Dental Advisory Given  Plan Discussed with: CRNA  Anesthesia Plan Comments:          Anesthesia Quick Evaluation  "

## 2024-06-07 NOTE — Anesthesia Postprocedure Evaluation (Signed)
"   Anesthesia Post Note  Patient: Earl Murray  Procedure(s) Performed: COLONOSCOPY POLYPECTOMY, INTESTINE  Patient location during evaluation: PACU Anesthesia Type: General Level of consciousness: awake and alert Pain management: satisfactory to patient Vital Signs Assessment: post-procedure vital signs reviewed and stable Respiratory status: nonlabored ventilation Cardiovascular status: stable Anesthetic complications: no   No notable events documented.   Last Vitals:  Vitals:   06/07/24 0944 06/07/24 0945  BP:  (!) 149/110  Pulse:  81  Resp: 14 12  Temp:    SpO2:  98%    Last Pain:  Vitals:   06/07/24 0938  TempSrc:   PainSc: 0-No pain                 VAN STAVEREN,Mckinleigh Schuchart      "

## 2024-06-07 NOTE — Op Note (Signed)
 [facility] Gastroenterology Patient Name: Earl Murray Procedure Date: 06/07/2024 8:52 AM MRN: [id number] Account #: [id number] Date of Birth: [birth year] Admit Type: Outpatient Age: 69 Room: endo 4 Gender: Male Note Status: Finalized Instrument Name: Colon Scope [id number] Procedure:             Colonoscopy Indications:           Lower abdominal pain, Weight loss Providers:             Corinn Jess Brooklyn MD, MD Referring MD:          Salli Amato, MD Medicines:             General Anesthesia Complications:         No immediate complications. Estimated blood loss: None. Procedure:             Pre-Anesthesia Assessment:                        - Prior to the procedure, a History and Physical was                         performed, and patient medications and allergies were                         reviewed. The patient is competent. The risks and                         benefits of the procedure and the sedation options and                         risks were discussed with the patient. All questions                         were answered and informed consent was obtained.                         Patient identification and proposed procedure were                         verified by the physician, the nurse, the                         anesthesiologist, the anesthetist and the technician                         in the pre-procedure area in the procedure room in the                         endoscopy suite. Mental Status Examination: alert and                         oriented. Airway Examination: normal oropharyngeal                         airway and neck mobility. Respiratory Examination:                         clear to auscultation. CV Examination: normal.  Prophylactic Antibiotics: The patient does not require                         prophylactic antibiotics. Prior Anticoagulants: The                         patient has taken Eliquis  (apixaban ),  last dose was 3                         days prior to procedure. ASA Grade Assessment: III - A                         patient with severe systemic disease. After reviewing                         the risks and benefits, the patient was deemed in                         satisfactory condition to undergo the procedure. The                         anesthesia plan was to use general anesthesia.                         Immediately prior to administration of medications,                         the patient was re-assessed for adequacy to receive                         sedatives. The heart rate, respiratory rate, oxygen                         saturations, blood pressure, adequacy of pulmonary                         ventilation, and response to care were monitored                         throughout the procedure. The physical status of the                         patient was re-assessed after the procedure.                        After obtaining informed consent, the colonoscope was                         passed under direct vision. Throughout the procedure,                         the patient's blood pressure, pulse, and oxygen                         saturations were monitored continuously. The                         Colonoscope was introduced through the anus and  advanced to the the cecum, identified by appendiceal                         orifice and ileocecal valve. The colonoscopy was                         performed with moderate difficulty due to significant                         looping. Successful completion of the procedure was                         aided by applying abdominal pressure. The patient                         tolerated the procedure well. The quality of the bowel                         preparation was evaluated using the BBPS [facility] Bowel                         Preparation Scale) with scores of: Right Colon = 2                          (minor amount of residual staining, small fragments of                         stool and/or opaque liquid, but mucosa seen well),                         Transverse Colon = 2 (minor amount of residual                         staining, small fragments of stool and/or opaque                         liquid, but mucosa seen well) and Left Colon = 2                         (minor amount of residual staining, small fragments of                         stool and/or opaque liquid, but mucosa seen well). The                         total BBPS score equals 6. The ileocecal valve,                         appendiceal orifice, and rectum were photographed. Findings:      The perianal and digital rectal examinations were normal. Pertinent       negatives include normal sphincter tone and no palpable rectal lesions.      Two sessile polyps were found in the descending colon. The polyps were 3       to 6 mm in size. These polyps were removed with a cold snare. Resection       and retrieval were complete. Estimated blood  loss: none.      The retroflexed view of the distal rectum and anal verge was normal and       showed no anal or rectal abnormalities.      Copious quantities of liquid stool was found in the entire colon,       precluding visualization. Lavage of the area was performed using 200 -       500 mL of sterile water, resulting in clearance with good visualization. Impression:            - Two 3 to 6 mm polyps in the descending colon,                         removed with a cold snare. Resected and retrieved.                        - The distal rectum and anal verge are normal on                         retroflexion view.                        - Stool in the entire examined colon. Recommendation:        - Discharge patient to home (with escort).                        - Resume previous diet today.                        - Continue present medications.                        - Await pathology  results.                        - Repeat colonoscopy in 5 to 7 years for surveillance                         based on pathology results. Procedure Code(s):     --- Professional ---                        [phone], Colonoscopy, flexible; with removal of                         tumor(s), polyp(s), or other lesion(s) by snare                         technique Diagnosis Code(s):     --- Professional ---                        D12.4, Benign neoplasm of descending colon                        R10.30, Lower abdominal pain, unspecified                        R63.4, Abnormal weight loss CPT copyright 2022 American Medical Association. All rights reserved. The codes documented in this report are preliminary and upon coder review may  be revised to meet current  compliance requirements. Dr. Corinn Brooklyn Corinn Jess Brooklyn MD, MD 06/07/2024 9:27:46 AM This report has been signed electronically. Number of Addenda: 0 Note Initiated On: 06/07/2024 8:52 AM Scope Withdrawal Time: 0 hours 11 minutes 2 seconds  Total Procedure Duration: 0 hours 16 minutes 1 second  Estimated Blood Loss:  Estimated blood loss: none.      [facility]

## 2024-06-08 ENCOUNTER — Encounter: Payer: Self-pay | Admitting: Gastroenterology

## 2024-06-08 LAB — SURGICAL PATHOLOGY

## 2024-06-09 ENCOUNTER — Ambulatory Visit: Payer: Self-pay | Admitting: Gastroenterology
